# Patient Record
Sex: Female | Born: 1935 | Race: White | Hispanic: No | Smoking: Former smoker
Health system: Southern US, Community
[De-identification: ages and names within clinical notes are randomized; demographics above are authoritative.]

## PROBLEM LIST (undated history)

## (undated) DIAGNOSIS — Z950 Presence of cardiac pacemaker: Secondary | ICD-10-CM

## (undated) DIAGNOSIS — E785 Hyperlipidemia, unspecified: Secondary | ICD-10-CM

## (undated) DIAGNOSIS — F32A Depression, unspecified: Secondary | ICD-10-CM

## (undated) DIAGNOSIS — F329 Major depressive disorder, single episode, unspecified: Secondary | ICD-10-CM

## (undated) DIAGNOSIS — E119 Type 2 diabetes mellitus without complications: Secondary | ICD-10-CM

## (undated) DIAGNOSIS — I2510 Atherosclerotic heart disease of native coronary artery without angina pectoris: Secondary | ICD-10-CM

## (undated) DIAGNOSIS — I4892 Unspecified atrial flutter: Secondary | ICD-10-CM

## (undated) DIAGNOSIS — I10 Essential (primary) hypertension: Secondary | ICD-10-CM

## (undated) DIAGNOSIS — I509 Heart failure, unspecified: Secondary | ICD-10-CM

## (undated) DIAGNOSIS — I442 Atrioventricular block, complete: Secondary | ICD-10-CM

## (undated) HISTORY — DX: Type 2 diabetes mellitus without complications: E11.9

## (undated) HISTORY — DX: Heart failure, unspecified: I50.9

## (undated) HISTORY — DX: Hyperlipidemia, unspecified: E78.5

## (undated) HISTORY — DX: Atherosclerotic heart disease of native coronary artery without angina pectoris: I25.10

## (undated) HISTORY — DX: Major depressive disorder, single episode, unspecified: F32.9

## (undated) HISTORY — DX: Presence of cardiac pacemaker: Z95.0

## (undated) HISTORY — DX: Atrioventricular block, complete: I44.2

## (undated) HISTORY — DX: Depression, unspecified: F32.A

## (undated) HISTORY — DX: Unspecified atrial flutter: I48.92

## (undated) HISTORY — DX: Essential (primary) hypertension: I10

---
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
## 1989-01-09 HISTORY — PX: BREAST BIOPSY: SHX20

## 1994-01-09 HISTORY — PX: OTHER SURGICAL HISTORY: SHX169

## 1999-07-27 ENCOUNTER — Ambulatory Visit (HOSPITAL_COMMUNITY): Admission: RE | Admit: 1999-07-27 | Discharge: 1999-07-27 | Payer: Self-pay | Admitting: Cardiology

## 2000-10-19 ENCOUNTER — Emergency Department (HOSPITAL_COMMUNITY): Admission: EM | Admit: 2000-10-19 | Discharge: 2000-10-19 | Payer: Self-pay | Admitting: Emergency Medicine

## 2000-10-19 ENCOUNTER — Encounter: Payer: Self-pay | Admitting: Emergency Medicine

## 2000-10-31 ENCOUNTER — Encounter: Admission: RE | Admit: 2000-10-31 | Discharge: 2000-12-19 | Payer: Self-pay | Admitting: *Deleted

## 2001-02-04 ENCOUNTER — Encounter: Admission: RE | Admit: 2001-02-04 | Discharge: 2001-03-05 | Payer: Self-pay | Admitting: Orthopaedic Surgery

## 2001-02-13 ENCOUNTER — Ambulatory Visit (HOSPITAL_COMMUNITY): Admission: RE | Admit: 2001-02-13 | Discharge: 2001-02-13 | Payer: Self-pay | Admitting: Gastroenterology

## 2001-02-13 ENCOUNTER — Encounter: Payer: Self-pay | Admitting: Gastroenterology

## 2001-02-15 ENCOUNTER — Ambulatory Visit (HOSPITAL_COMMUNITY): Admission: RE | Admit: 2001-02-15 | Discharge: 2001-02-15 | Payer: Self-pay | Admitting: Gastroenterology

## 2001-02-15 ENCOUNTER — Encounter: Payer: Self-pay | Admitting: Gastroenterology

## 2001-04-08 ENCOUNTER — Ambulatory Visit (HOSPITAL_COMMUNITY): Admission: RE | Admit: 2001-04-08 | Discharge: 2001-04-08 | Payer: Self-pay | Admitting: Gastroenterology

## 2001-04-08 ENCOUNTER — Encounter: Payer: Self-pay | Admitting: Gastroenterology

## 2004-02-22 ENCOUNTER — Ambulatory Visit: Payer: Self-pay | Admitting: Cardiology

## 2004-02-25 ENCOUNTER — Ambulatory Visit: Payer: Self-pay | Admitting: Cardiology

## 2004-03-11 ENCOUNTER — Ambulatory Visit: Payer: Self-pay | Admitting: Cardiology

## 2005-01-04 ENCOUNTER — Inpatient Hospital Stay (HOSPITAL_COMMUNITY): Admission: AD | Admit: 2005-01-04 | Discharge: 2005-01-08 | Payer: Self-pay | Admitting: Cardiology

## 2005-01-04 ENCOUNTER — Ambulatory Visit: Payer: Self-pay | Admitting: Cardiology

## 2005-01-04 ENCOUNTER — Encounter: Payer: Self-pay | Admitting: Cardiology

## 2005-01-04 ENCOUNTER — Ambulatory Visit: Payer: Self-pay | Admitting: Internal Medicine

## 2005-01-05 ENCOUNTER — Encounter: Payer: Self-pay | Admitting: Cardiology

## 2005-01-06 ENCOUNTER — Encounter: Payer: Self-pay | Admitting: Cardiology

## 2005-01-06 ENCOUNTER — Ambulatory Visit: Payer: Self-pay | Admitting: Cardiology

## 2005-01-19 ENCOUNTER — Ambulatory Visit: Payer: Self-pay | Admitting: Cardiology

## 2005-01-20 ENCOUNTER — Ambulatory Visit (HOSPITAL_COMMUNITY): Admission: RE | Admit: 2005-01-20 | Discharge: 2005-01-21 | Payer: Self-pay | Admitting: Cardiology

## 2005-01-30 ENCOUNTER — Ambulatory Visit: Payer: Self-pay

## 2005-03-01 ENCOUNTER — Ambulatory Visit: Payer: Self-pay | Admitting: Cardiology

## 2005-03-06 ENCOUNTER — Ambulatory Visit: Payer: Self-pay | Admitting: Cardiology

## 2005-04-18 ENCOUNTER — Ambulatory Visit: Payer: Self-pay | Admitting: Internal Medicine

## 2005-07-20 ENCOUNTER — Ambulatory Visit: Payer: Self-pay | Admitting: Cardiology

## 2005-08-03 ENCOUNTER — Ambulatory Visit: Payer: Self-pay | Admitting: Cardiology

## 2005-12-19 ENCOUNTER — Ambulatory Visit: Payer: Self-pay | Admitting: Internal Medicine

## 2006-01-15 ENCOUNTER — Ambulatory Visit: Payer: Self-pay | Admitting: Cardiology

## 2006-01-16 ENCOUNTER — Ambulatory Visit: Payer: Self-pay | Admitting: Internal Medicine

## 2006-02-19 ENCOUNTER — Ambulatory Visit: Payer: Self-pay | Admitting: Internal Medicine

## 2006-03-01 ENCOUNTER — Ambulatory Visit: Payer: Self-pay | Admitting: Internal Medicine

## 2006-03-01 LAB — CONVERTED CEMR LAB
BUN: 14 mg/dL (ref 6–23)
Basophils Relative: 0.8 % (ref 0.0–1.0)
CO2: 32 meq/L (ref 19–32)
Cholesterol: 215 mg/dL (ref 0–200)
Direct LDL: 151 mg/dL
GFR calc Af Amer: 125 mL/min
Glucose, Bld: 167 mg/dL — ABNORMAL HIGH (ref 70–99)
HCT: 39.9 % (ref 36.0–46.0)
Hemoglobin: 13.9 g/dL (ref 12.0–15.0)
Lymphocytes Relative: 30.3 % (ref 12.0–46.0)
Microalb, Ur: 0.8 mg/dL (ref 0.0–1.9)
Monocytes Absolute: 0.6 10*3/uL (ref 0.2–0.7)
Monocytes Relative: 8.7 % (ref 3.0–11.0)
Neutro Abs: 3.6 10*3/uL (ref 1.4–7.7)
Neutrophils Relative %: 57.6 % (ref 43.0–77.0)
Potassium: 3.9 meq/L (ref 3.5–5.1)
Sodium: 141 meq/L (ref 135–145)
TSH: 1.7 microintl units/mL (ref 0.35–5.50)
VLDL: 28 mg/dL (ref 0–40)

## 2006-03-13 ENCOUNTER — Ambulatory Visit: Payer: Self-pay | Admitting: Internal Medicine

## 2006-03-22 ENCOUNTER — Ambulatory Visit: Payer: Self-pay | Admitting: Internal Medicine

## 2006-04-03 ENCOUNTER — Ambulatory Visit: Payer: Self-pay | Admitting: Internal Medicine

## 2006-04-03 LAB — CONVERTED CEMR LAB
AST: 15 units/L (ref 0–37)
Bilirubin, Direct: 0.2 mg/dL (ref 0.0–0.3)
Cholesterol: 151 mg/dL (ref 0–200)
HDL: 47.3 mg/dL (ref >39.0)
Total Bilirubin: 0.8 mg/dL (ref 0.3–1.2)
Total CHOL/HDL Ratio: 3.2
Total Protein: 7.3 g/dL (ref 6.0–8.3)
Triglycerides: 103 mg/dL (ref 0–149)

## 2006-04-13 ENCOUNTER — Ambulatory Visit: Payer: Self-pay | Admitting: Internal Medicine

## 2006-05-08 ENCOUNTER — Ambulatory Visit: Payer: Self-pay | Admitting: Internal Medicine

## 2006-06-11 ENCOUNTER — Ambulatory Visit: Payer: Self-pay | Admitting: Cardiology

## 2006-07-03 ENCOUNTER — Ambulatory Visit: Payer: Self-pay | Admitting: Internal Medicine

## 2006-07-06 ENCOUNTER — Ambulatory Visit: Payer: Self-pay | Admitting: Internal Medicine

## 2006-07-06 LAB — CONVERTED CEMR LAB
ALT: 19 units/L (ref 0–35)
AST: 17 units/L (ref 0–37)
BUN: 17 mg/dL (ref 6–23)
CO2: 32 meq/L (ref 19–32)
Calcium: 10.6 mg/dL — ABNORMAL HIGH (ref 8.4–10.5)
Chloride: 109 meq/L (ref 96–112)
Cholesterol: 210 mg/dL (ref 0–200)
Creatinine, Ser: 0.6 mg/dL (ref 0.4–1.2)
Direct LDL: 132.3 mg/dL
GFR calc Af Amer: 125 mL/min
GFR calc non Af Amer: 103 mL/min
Glucose, Bld: 140 mg/dL — ABNORMAL HIGH (ref 70–99)
HDL: 49.1 mg/dL (ref >39.0)
Hgb A1c MFr Bld: 6.8 % — ABNORMAL HIGH (ref 4.6–6.0)
Potassium: 3.8 meq/L (ref 3.5–5.1)
Sodium: 144 meq/L (ref 135–145)
Total CHOL/HDL Ratio: 4.3
Triglycerides: 133 mg/dL (ref 0–149)
VLDL: 27 mg/dL (ref 0–40)

## 2006-08-28 ENCOUNTER — Ambulatory Visit: Payer: Self-pay | Admitting: Internal Medicine

## 2006-09-03 ENCOUNTER — Ambulatory Visit: Payer: Self-pay | Admitting: Internal Medicine

## 2006-09-20 ENCOUNTER — Ambulatory Visit: Payer: Self-pay | Admitting: Internal Medicine

## 2006-10-23 ENCOUNTER — Ambulatory Visit: Payer: Self-pay | Admitting: Internal Medicine

## 2006-10-29 ENCOUNTER — Ambulatory Visit (HOSPITAL_COMMUNITY): Admission: RE | Admit: 2006-10-29 | Discharge: 2006-10-29 | Payer: Self-pay | Admitting: Ophthalmology

## 2006-11-05 ENCOUNTER — Ambulatory Visit: Payer: Self-pay | Admitting: Internal Medicine

## 2006-11-05 ENCOUNTER — Encounter: Payer: Self-pay | Admitting: Internal Medicine

## 2006-11-05 DIAGNOSIS — I10 Essential (primary) hypertension: Secondary | ICD-10-CM | POA: Insufficient documentation

## 2006-11-05 DIAGNOSIS — E782 Mixed hyperlipidemia: Secondary | ICD-10-CM

## 2006-11-05 DIAGNOSIS — E119 Type 2 diabetes mellitus without complications: Secondary | ICD-10-CM

## 2006-11-08 ENCOUNTER — Ambulatory Visit: Payer: Self-pay | Admitting: Internal Medicine

## 2006-11-15 ENCOUNTER — Encounter: Payer: Self-pay | Admitting: Internal Medicine

## 2006-11-16 LAB — CONVERTED CEMR LAB
BUN: 16 mg/dL (ref 6–23)
CO2: 32 meq/L (ref 19–32)
Calcium: 11.2 mg/dL — ABNORMAL HIGH (ref 8.4–10.5)
Creatinine, Ser: 0.6 mg/dL (ref 0.4–1.2)
Creatinine,U: 79 mg/dL
GFR calc Af Amer: 125 mL/min
Microalb Creat Ratio: 15.2 mg/g (ref 0.0–30.0)
Microalb, Ur: 1.2 mg/dL (ref 0.0–1.9)
Potassium: 4.1 meq/L (ref 3.5–5.1)

## 2006-12-10 ENCOUNTER — Ambulatory Visit: Payer: Self-pay

## 2006-12-18 ENCOUNTER — Ambulatory Visit: Payer: Self-pay | Admitting: Internal Medicine

## 2007-01-21 ENCOUNTER — Ambulatory Visit: Payer: Self-pay | Admitting: Internal Medicine

## 2007-05-22 ENCOUNTER — Ambulatory Visit: Payer: Self-pay | Admitting: Internal Medicine

## 2007-06-18 ENCOUNTER — Ambulatory Visit: Payer: Self-pay | Admitting: Internal Medicine

## 2007-09-19 ENCOUNTER — Ambulatory Visit: Payer: Self-pay | Admitting: Internal Medicine

## 2007-12-19 ENCOUNTER — Ambulatory Visit: Payer: Self-pay | Admitting: Internal Medicine

## 2007-12-23 ENCOUNTER — Ambulatory Visit: Payer: Self-pay

## 2008-02-24 ENCOUNTER — Ambulatory Visit (HOSPITAL_COMMUNITY): Admission: RE | Admit: 2008-02-24 | Discharge: 2008-02-24 | Payer: Self-pay | Admitting: Ophthalmology

## 2008-03-19 ENCOUNTER — Ambulatory Visit: Payer: Self-pay | Admitting: Internal Medicine

## 2008-04-06 ENCOUNTER — Ambulatory Visit (HOSPITAL_COMMUNITY): Admission: RE | Admit: 2008-04-06 | Discharge: 2008-04-06 | Payer: Self-pay | Admitting: Ophthalmology

## 2008-05-02 ENCOUNTER — Encounter (INDEPENDENT_AMBULATORY_CARE_PROVIDER_SITE_OTHER): Payer: Self-pay | Admitting: *Deleted

## 2008-05-27 DIAGNOSIS — I442 Atrioventricular block, complete: Secondary | ICD-10-CM | POA: Insufficient documentation

## 2008-05-27 DIAGNOSIS — H409 Unspecified glaucoma: Secondary | ICD-10-CM | POA: Insufficient documentation

## 2008-05-27 DIAGNOSIS — I2510 Atherosclerotic heart disease of native coronary artery without angina pectoris: Secondary | ICD-10-CM

## 2008-05-27 DIAGNOSIS — Z950 Presence of cardiac pacemaker: Secondary | ICD-10-CM | POA: Insufficient documentation

## 2008-06-18 ENCOUNTER — Ambulatory Visit: Payer: Self-pay | Admitting: Internal Medicine

## 2008-06-23 ENCOUNTER — Ambulatory Visit: Payer: Self-pay | Admitting: Internal Medicine

## 2008-06-23 DIAGNOSIS — G4710 Hypersomnia, unspecified: Secondary | ICD-10-CM

## 2008-06-23 DIAGNOSIS — R609 Edema, unspecified: Secondary | ICD-10-CM

## 2008-06-26 ENCOUNTER — Encounter: Payer: Self-pay | Admitting: Internal Medicine

## 2008-06-26 ENCOUNTER — Ambulatory Visit: Payer: Self-pay

## 2008-07-07 ENCOUNTER — Telehealth: Payer: Self-pay | Admitting: Internal Medicine

## 2008-08-18 ENCOUNTER — Telehealth: Payer: Self-pay | Admitting: Internal Medicine

## 2008-08-19 ENCOUNTER — Encounter (INDEPENDENT_AMBULATORY_CARE_PROVIDER_SITE_OTHER): Payer: Self-pay | Admitting: *Deleted

## 2008-09-01 ENCOUNTER — Telehealth (INDEPENDENT_AMBULATORY_CARE_PROVIDER_SITE_OTHER): Payer: Self-pay

## 2008-09-02 ENCOUNTER — Ambulatory Visit: Payer: Self-pay

## 2008-09-02 ENCOUNTER — Encounter: Payer: Self-pay | Admitting: Cardiovascular Disease

## 2008-09-17 ENCOUNTER — Ambulatory Visit: Payer: Self-pay | Admitting: Internal Medicine

## 2008-12-17 ENCOUNTER — Ambulatory Visit: Payer: Self-pay | Admitting: Internal Medicine

## 2009-01-28 ENCOUNTER — Encounter: Payer: Self-pay | Admitting: Internal Medicine

## 2009-01-28 ENCOUNTER — Ambulatory Visit: Payer: Self-pay

## 2009-03-18 ENCOUNTER — Ambulatory Visit: Payer: Self-pay | Admitting: Internal Medicine

## 2009-06-17 ENCOUNTER — Ambulatory Visit: Payer: Self-pay | Admitting: Internal Medicine

## 2009-07-06 ENCOUNTER — Telehealth: Payer: Self-pay | Admitting: Internal Medicine

## 2009-09-10 ENCOUNTER — Ambulatory Visit: Payer: Self-pay | Admitting: Internal Medicine

## 2009-11-05 ENCOUNTER — Encounter (INDEPENDENT_AMBULATORY_CARE_PROVIDER_SITE_OTHER): Payer: Self-pay | Admitting: *Deleted

## 2009-12-16 ENCOUNTER — Ambulatory Visit: Payer: Self-pay | Admitting: Internal Medicine

## 2010-02-08 NOTE — Cardiovascular Report (Signed)
Summary: TTM   TTM   Imported By: Roderic Ovens 07/19/2009 14:50:48  _____________________________________________________________________  External Attachment:    Type:   Image     Comment:   External Document

## 2010-02-08 NOTE — Assessment & Plan Note (Signed)
Summary: [facility]  Medications Added METFORMIN HCL 500 MG TB24 (METFORMIN HCL) Take 1 tablet by mouth two times a day DIOVAN HCT 160-12.5 MG TABS (VALSARTAN-HYDROCHLOROTHIAZIDE) Take 1 tablet by mouth once a day * VITAMIN D 5000 UNIT TABS (CHOLECALCIFEROL) Take 1 tablet by mouth two times a day PREDNISONE 10 MG TABS (PREDNISONE) use as directed dose pak ZYRTEC ALLERGY 10 MG TBDP (CETIRIZINE HCL) Take 1 tablet by mouth once a day for 10 days CIPROFLOXACIN HCL 250 MG TABS (CIPROFLOXACIN HCL) Take 1 tablet by mouth two times a day WELCHOL 3.75 GM PACK (COLESEVELAM HCL) one pak by mouth daily * MEGA RED 300MG  Take 1 tablet by mouth once a day GLUCOSAMINE-CHONDROITIN  CAPS (GLUCOSAMINE-CHONDROIT-VIT C-MN) Take 1 tablet by mouth two times a day GINGER OIL  OIL (GINGER OIL) 150mg  Take 1 tablet by mouth two times a day CINNAMON 500 MG CAPS (CINNAMON) Take 1/2 tablet by mouth two times a day      Allergies Added:   Visit Type:  Pacemaker check Primary Provider:  Darcel Bayley Nyland,MD   History of Present Illness: The patient presents today for routine electrophysiology followup. She reports doing very well since last being seen in our clinic. The patient denies symptoms of palpitations, chest pain, shortness of breath, orthopnea, PND, lower extremity edema, dizziness, presyncope, syncope, or neurologic sequela. She has noticed redness, swelling, and itching of her L eye for 1 week.  She was seen in Dr Deitra Mayo office and started on cipro and prednisone.  The patient is tolerating medications without difficulties and is otherwise without complaint today.    Preventive Screening-Counseling & Management  Alcohol-Tobacco     Smoking Status: quit     Year Quit: 1960's  Current Medications (verified): 1)  Metformin Hcl 500 Mg Tb24 (Metformin Hcl) .... Take 1 Tablet By Mouth Two Times A Day 2)  Diovan Hct 160-12.5 Mg Tabs (Valsartan-Hydrochlorothiazide) .... Take 1 Tablet By Mouth Once A Day 3)   Carvedilol 6.25 Mg Tabs (Carvedilol) .... One By Mouth Two Times A Day 4)  Coq10 100 Mg Caps (Coenzyme Q10) .... One By Mouth Daily 5)  Vitamin D 5000 Unit Tabs (Cholecalciferol) .... Take 1 Tablet By Mouth Two Times A Day 6)  Prednisone 10 Mg Tabs (Prednisone) .... Use As Directed Dose Pak 7)  Zyrtec Allergy 10 Mg Tbdp (Cetirizine Hcl) .... Take 1 Tablet By Mouth Once A Day For 10 Days 8)  Ciprofloxacin Hcl 250 Mg Tabs (Ciprofloxacin Hcl) .... Take 1 Tablet By Mouth Two Times A Day 9)  Welchol 3.75 Gm Pack (Colesevelam Hcl) .... One Pak By Mouth Daily 10)  Mega Red 300mg  .... Take 1 Tablet By Mouth Once A Day 11)  Glucosamine-Chondroitin  Caps (Glucosamine-Chondroit-Vit C-Mn) .... Take 1 Tablet By Mouth Two Times A Day 12)  Ginger Oil  Oil (Ginger Oil) .... 150mg  Take 1 Tablet By Mouth Two Times A Day 13)  Cinnamon 500 Mg Caps (Cinnamon) .... Take 1/2 Tablet By Mouth Two Times A Day  Allergies (verified): 1)  ! Lipitor 2)  ! Ace Inhibitors 3)  * Plavx  Comments:  Nurse/Medical Assistant: The patient's medication list and allergies were reviewed with the patient and were updated in the Medication and Allergy Lists.  Past History:  Past Medical History: Reviewed history from 05/27/2008 and no changes required. CAD, UNSPECIFIED SITE (ICD-414.00) PACEMAKER (ICD-V45.Marland Kitchen01) AV BLOCK, COMPLETE (ICD-426.0) HYPERLIPIDEMIA (ICD-272.2) HYPERTENSION (ICD-401.9) DIABETES MELLITUS, TYPE II (ICD-250.00) GLAUCOMA (ICD-365.9)    Past Surgical History: Reviewed  history from 05/27/2008 and no changes required. tonsillectomy in 1937. breast biopsy in 1991. left total knee replacement in 1996 PCM - [facility]  Social History: Reviewed history from 05/27/2008 and no changes required. Retired  Divorced  Tobacco Use - No.  Alcohol Use - yes Regular Exercise - no Drug Use - no Smoking Status:  quit  Review of Systems       All systems are reviewed and negative except as listed in the HPI.    Vital Signs:  Patient profile:   77 year old female Height:      63 inches Weight:      194 pounds BMI:     34.49 Pulse rate:   91 / minute BP sitting:   141 / 81  (left arm) Cuff size:   large  Vitals Entered By: Carlye Grippe (September 10, 2009 3:47 PM)  Nutrition Counseling: Patient's BMI is greater than 25 and therefore counseled on weight management options.  Physical Exam  General:  Well developed, well nourished, in no acute distress. Head:  normocephalic and atraumatic Eyes:  L eye periorbital cellulitis vision appears grossly preserved Mouth:  Teeth, gums and palate normal. Oral mucosa normal. Neck:  supple Chest Wall:  pacemaker pocket well healed Lungs:  Clear bilaterally to auscultation and percussion. Heart:  Non-displaced PMI, chest non-tender; regular rate and rhythm, S1, S2 without murmurs, rubs or gallops. Carotid upstroke normal, no bruit. Normal abdominal aortic size, no bruits. Femorals normal pulses, no bruits. Pedals normal pulses. No edema, no varicosities. Abdomen:  Bowel sounds positive; abdomen soft and non-tender without masses, organomegaly, or hernias noted. No hepatosplenomegaly. Msk:  Back normal, normal gait. Muscle strength and tone normal. Pulses:  pulses normal in all 4 extremities Extremities:  No clubbing or cyanosis. Neurologic:  Alert and oriented x 3.   PPM Specifications Following MD:  Sherryl Manges, MD     PPM Vendor:  [employer] Jude     PPM Model Number:  [id number]     PPM Serial Number:  [id number] PPM DOI:  January 22, 1927     PPM Implanting MD:  Sherryl Manges, MD  Lead 1    Location: [facility]     DOI: 01/06/2005     Model #: [id number]     Serial #: [id number]     Status: active Lead 2    Location: RV     DOI: 01/06/2005     Model #: [id number]     Serial #: [id number]     Status: active   Indications:  iNTERMITTENT COMPLETE HEART BLOCK   PPM Follow Up Battery Voltage:  2.76 V     Battery Est. Longevity:  5.75-8.50 YRS     Pacer Dependent:  Yes       PPM  Device Measurements Atrium  Amplitude: 2.4 mV, Impedance: 394 ohms, Threshold: 0.75 V at 0.5 msec Right Ventricle  Amplitude: PACED mV, Impedance: 1045 ohms, Threshold: 0.750 V at 0.5 msec  Episodes MS Episodes:  2     Percent Mode Switch:  <1%     Coumadin:  No Ventricular High Rate:  0     Atrial Pacing:  5.9%     Ventricular Pacing:  >99%  Parameters Mode:  DDD     Lower Rate Limit:  60     Upper Rate Limit:  125 Paced AV Delay:  200     Sensed AV Delay:  170 Next Cardiology Appt Due:  03/10/2010 Tech Comments:  2 AMS EPISODES--BOTH  WERE 6 SECONDS.  PACER DEPENDENT --NO UNDERLYING AT VVI 30.  NORMAL DEVICE FUNCTION.  NO CHANGES MADE. TTM THRU [facility] IN 3 MTHS.  ROV IN 6 MTHS W/DEVICE CLINIC. Vella Kohler  September 10, 2009 4:41 PM MD Comments:  agree  Impression & Recommendations:  Problem # 1:  AV BLOCK, COMPLETE (ICD-426.0) Normal pacemaker function no changes today  Problem # 2:  HYPERLIPIDEMIA (ICD-272.2) stable  Problem # 3:  HYPERTENSION (ICD-401.9) salt restriction advised  Problem # 4:  CAD, UNSPECIFIED SITE (ICD-414.00) no symptoms of ischemia  Patient Instructions: 1)  return in 6 months

## 2010-02-08 NOTE — Progress Notes (Signed)
Summary: question re pacemaker at the airport   Phone Note Call from Patient   Caller: Patient [phone] Reason for Call: Talk to Nurse Summary of Call: pt flying to [place] this weekend and wants to know if she can go through the security area at the airport due to her pacemaker or does she have to be patted down? pls call [phone] Initial call taken by: Glynda Jaeger,  July 06, 2009 3:29 PM  Follow-up for Phone Call        PT AWARE OKAY TO  GO THROUGH SECURITY AND  METAL DETECTOR AT AIRPORT  PER AMBER. Follow-up by: Scherrie Bateman, LPN,  July 06, 2009 3:43 PM

## 2010-02-08 NOTE — Cardiovascular Report (Signed)
Summary: Office Visit   Office Visit   Imported By: Roderic Ovens 02/10/2009 13:58:10  _____________________________________________________________________  External Attachment:    Type:   Image     Comment:   External Document

## 2010-02-08 NOTE — Procedures (Signed)
Summary: pc2  Medications Added CARVEDILOL 6.25 MG TABS (CARVEDILOL) one by mouth two times a day CALCIUM 500 MG TABS (CALCIUM CARBONATE) one by mouth daily COQ10 100 MG CAPS (COENZYME Q10) one by mouth daily * VITAMIN D 5000 UNIT TABS (CHOLECALCIFEROL)       Allergies Added: * PLAVX  Current Medications (verified): 1)  Metformin Hcl 500 Mg Tb24 (Metformin Hcl) .... Take 2 Tablet By Mouth Every Night 2)  Diovan Hct 80-12.5 Mg Tabs (Valsartan-Hydrochlorothiazide) .... Take One Tablet Once Daily 3)  Carvedilol 6.25 Mg Tabs (Carvedilol) .... One By Mouth Two Times A Day 4)  Calcium 500 Mg Tabs (Calcium Carbonate) .... One By Mouth Daily 5)  Coq10 100 Mg Caps (Coenzyme Q10) .... One By Mouth Daily 6)  Vitamin D 5000 Unit Tabs (Cholecalciferol)  Allergies (verified): 1)  ! Lipitor 2)  ! Ace Inhibitors 3)  * Plavx   PPM Specifications Following MD:  Sherryl Manges, MD     PPM Vendor:  [employer] Jude     PPM Model Number:  [id number]     PPM Serial Number:  [id number] PPM DOI:  01-18-1927     PPM Implanting MD:  Sherryl Manges, MD  Lead 1    Location: RA     DOI: 01/06/2005     Model #: [id number]     Serial #: [id number]     Status: active Lead 2    Location: RV     DOI: 01/06/2005     Model #: [id number]     Serial #: [id number]     Status: active   Indications:  iNTERMITTENT COMPLETE HEART BLOCK   PPM Follow Up Remote Check?  No Battery Voltage:  2.78 V     Battery Est. Longevity:  7.5 years     Pacer Dependent:  Yes       PPM Device Measurements Atrium  Amplitude: 0.9 mV, Impedance: 387 ohms, Threshold: 0.5 V at 0.5 msec Right Ventricle  Impedance: 861 ohms, Threshold: 0.625 V at 0.5 msec  Episodes MS Episodes:  0     Percent Mode Switch:  0     Coumadin:  No Atrial Pacing:  6.2%     Ventricular Pacing:  100%  Parameters Mode:  DDD     Lower Rate Limit:  60     Upper Rate Limit:  125 Paced AV Delay:  200     Sensed AV Delay:  170 Next Cardiology Appt Due:  07/09/2009 Tech Comments:  No parameter  changes.  Device function normal.  Rate response showed right shift.  She is 4 months post op knee replacement.  She will continue his TTM's with [facility] and return in 6 months for an office visit with Dr. Graciela Husbands. Altha Harm, LPN  January 28, 2009 12:59 PM

## 2010-02-08 NOTE — Cardiovascular Report (Signed)
Summary: TTM   TTM   Imported By: Roderic Ovens 01/29/2009 14:56:48  _____________________________________________________________________  External Attachment:    Type:   Image     Comment:   External Document

## 2010-02-08 NOTE — Cardiovascular Report (Signed)
Summary: TTM   TTM   Imported By: Roderic Ovens 03/31/2009 16:06:37  _____________________________________________________________________  External Attachment:    Type:   Image     Comment:   External Document

## 2010-02-08 NOTE — Miscellaneous (Signed)
Summary: dx correction  Clinical Lists Changes  Problems: Changed problem from PACEMAKER (ICD-V45..01) to PACEMAKER, PERMANENT (ICD-V45.01)  changed the incorrect dx code to correct dx code 

## 2010-02-08 NOTE — Cardiovascular Report (Signed)
Summary: [facility]  [facility]   Imported By: Dorise Hiss 09/16/2009 10:44:50  _____________________________________________________________________  External Attachment:    Type:   Image     Comment:   External Document

## 2010-02-10 NOTE — Cardiovascular Report (Signed)
Summary: TTM   TTM   Imported By: Roderic Ovens 12/24/2009 15:06:44  _____________________________________________________________________  External Attachment:    Type:   Image     Comment:   External Document

## 2010-03-17 ENCOUNTER — Encounter: Payer: Self-pay | Admitting: Internal Medicine

## 2010-03-17 DIAGNOSIS — I442 Atrioventricular block, complete: Secondary | ICD-10-CM

## 2010-03-30 ENCOUNTER — Ambulatory Visit: Payer: Self-pay

## 2010-04-21 LAB — BASIC METABOLIC PANEL
BUN: 13 mg/dL (ref 6–23)
CO2: 28 mEq/L (ref 19–32)
Calcium: 10.7 mg/dL — ABNORMAL HIGH (ref 8.4–10.5)
Chloride: 102 mEq/L (ref 96–112)
Creatinine, Ser: 0.47 mg/dL (ref 0.4–1.2)
GFR calc Af Amer: >60 mL/min (ref >60)

## 2010-04-26 LAB — BASIC METABOLIC PANEL
BUN: 10 mg/dL (ref 6–23)
CO2: 30 mEq/L (ref 19–32)
Calcium: 10.8 mg/dL — ABNORMAL HIGH (ref 8.4–10.5)
Chloride: 105 mEq/L (ref 96–112)
GFR calc non Af Amer: >60 mL/min (ref >60)
Glucose, Bld: 132 mg/dL — ABNORMAL HIGH (ref 70–99)
Potassium: 3.9 mEq/L (ref 3.5–5.1)
Sodium: 140 mEq/L (ref 135–145)

## 2010-04-26 LAB — HEMOGLOBIN AND HEMATOCRIT, BLOOD
HCT: 38.8 % (ref 36.0–46.0)
Hemoglobin: 13.3 g/dL (ref 12.0–15.0)

## 2010-05-24 NOTE — Letter (Signed)
June 18, 2007    Delaney Meigs, M.D.  [address]  [place], [place] [address]   RE:  DOMONIC, HISCOX  MRN:  [id number]  /  DOB:  1927-04-14   Dear Marolyn Haller:   Mrs. Gomm comes in today in follow-up for a pacemaker implanted for  complete heart block.  From that point of view, she is doing quite well.  She has no problems with chest pain or shortness of breath.  She is  starting to exercise with her daughter who underwent a freak injury of  her knee.  She has lost 10 pounds in the last year and a half and about  this, she is quite pleased.   MEDICATIONS:  1. Welchol.  2. Carvedilol 6.25 b.i.d.  3. Aspirin.  4. Metformin 500 b.i.d.  5. Diovan 80.   PHYSICAL EXAMINATION:  VITAL SIGNS:  Unfortunately, her blood pressure  is elevated 157/88.  Her pulse was 95.  LUNGS:  Clear.  CARDIOVASCULAR:  Her heart sounds were regular.  ABDOMEN:  Soft.  EXTREMITIES:  Without edema.   Interrogation of her [employer] pacemaker demonstrates a P-wave of 1.8  with impedance of 395, a threshold of 0.7 at 5.5 in both chambers, the R  wave was 11.7, impedance of 861.  Battery voltage 2.8.  She is 100%  ventricular paced and is indeed device dependent.   IMPRESSION:  1. Complete heart block.  2. Status post pacer for the above.  3. Ischemic heart disease with prior intervention of her left anterior      descending with normal left ventricular function.  4. Diabetes.  5. Hypertension.  6. Dyslipidemia.   Len, Mrs. Padin's device is working quite well.  She is exercising and  is able to lose weight.  Unfortunately, her secondary prevention  parameters, at least by labs that we have, are not well achieved.  Her  blood pressure was elevated today.  I told her that her target with her  diabetes was at least under 130/80.  Her hemoglobin A1c from 18 months  ago was 7.7 and her LDL was 130.  I told her her targets on these were  under 6.5 and under 70, respectively.  She is to see you tomorrow.  She  thinks that you have intercurrent lab work and it may well be that we  are closer to target.  I have given her a target card that we are  utilizing to try to help [place] track lab work from primary physicians so  that we do not have to repeat it and we are all on the same page as to  targets.   We will see her again in one year's time.  Please let us know if there  is anything we can do in the interim.    Sincerely,      Duke Salvia, MD, [facility]  Electronically Signed    SCK/MedQ  DD: 06/18/2007  DT: 06/18/2007  Job #: [id number]   CC:    Delaney Meigs, M.D.

## 2010-05-24 NOTE — Assessment & Plan Note (Signed)
[facility]                            CARDIOLOGY OFFICE NOTE   Brittany [name], Brittany [name]                      MRN:          [id number]  DATE:06/11/2006                            DOB:          1927/07/21    PRIMARY CARE PHYSICIAN:  Dr. Thomos Lemons.   REASON FOR VISIT:  Cardiac followup.   HISTORY OF PRESENT ILLNESS:  I saw Ms. An back in January.  Her  history is detailed in my previous note.  Symptomatically, she is not  having any problems with angina or limiting dyspnea on exertion beyond  NYHA class II.  Her electrocardiogram today shows a paced ventricular  rhythm at 98 beats per minute with magnet and 68 beats per minute  without, both dual chamber.  She is followed transtelephonically by Dr.  Graciela Husbands.  She brings in a blood pressure record today from home showing  systolics ranging anywhere from 110 to the 160s, although typically more  in the 130 range.  I spoke with her today a bit about perhaps  reconsidering Benicar which she had been on in the past as an addition  to her present regimen.  She is now on Welchol with prior STATIN  intolerance.  Her cholesterol has been followed by Dr. Artist Pais.   ALLERGIES:  ACE INHIBITORS and general STATIN intolerance.   PRESENT MEDICATIONS:  1. Welchol 25 mg 2 tablets p.o. b.i.d.  2. Metformin 500 mg 2 tablets p.o. nightly.  3. Carvedilol 6.25 mg p.o. b.i.d.  4. Hydrochlorothiazide 25 mg p.o. daily.  5. Multivitamin 1 p.o. daily.  6. Sublingual nitroglycerin 0.4 mg p.r.n.   REVIEW OF SYSTEMS:  As described in the history of present illness.  She  does have some cramps in her feet at times, and also reports not  sleeping well at night.   PHYSICAL EXAMINATION:  Blood pressure is 142/88, heart rate is 68,  weighs 192 pounds which is down 7 pounds from her last visit.  The patient is comfortable and in no acute distress.  Examination of the neck reveals no elevated jugular venous pressure,  loud bruits.   No thyromegaly is noted.  Lungs are clear without labored breathing at rest.  CARDIAC EXAM:  Reveals a regular rate and rhythm.  There is a soft,  systolic murmur, no S3 gallop.  ABDOMEN:  Soft, nontender.  EXTREMITIES:  Show no pitting edema.   IMPRESSION/RECOMMENDATIONS:  1. Coronary artery disease with a history of cutting balloon      angioplasty to the left anterior descending in January of 2007.      She is symptomatically stable.  I will plan to see her back in 6      months on medical therapy, and likely plan a followup Myoview at      that time.  She has not been taking aspirin daily, and we talked      about adding this back to her regimen.  I also think it would be      reasonable to reinitiate a low-dose angiotensin receptor blocker      (has tolerated Benicar  before) to both better optimize blood      pressure control and general cardiac risk modification.  She plans      to discuss this with Dr. Artist Pais later in the week.  She has been      somewhat hesitant in general to add any medications.  2. Further plans to follow.     Jonelle Sidle, MD  Electronically Signed    SGM/MedQ  DD: 06/11/2006  DT: 06/11/2006  Job #: [id number]   cc:   Barbette Hair. Artist Pais, DO

## 2010-05-27 NOTE — Cardiovascular Report (Signed)
[place]  Patient:    Brittany [name], Brittany [name]                      MRN: [id number] Proc. Date: 07/27/99 Adm. Date:  [id number] Disc. Date: [id number] Attending:  Lenoria Farrier CC:         Colon Flattery, D.O.             Thomas C. Wall, M.D. [facility]             Bruce R. Juanda Chance, M.D. [facility]             Cardiac pulmonary lab                        Cardiac Catheterization  PROCEDURE:  Cardiac catheterization.  INDICATION:  Ms. Stahl is 77 years old and has a history of hypertension and a history of chest and jaw discomfort suggestive of angina.  She was seen in consultation by Dr. Jesse Sans. Wall, who arranged for her to have evaluation with coronary angiography.  DESCRIPTION OF PROCEDURE:  The procedure was performed by the right femoral artery using arterial sheath and 6 French preformed coronary catheters.  A front wall arterial puncture was performed and Omnipaque contrast was used.  At the end of the procedure, the right femoral artery was closed with perclose.  This aortogram was performed to rule out renal vascular of hypertension.  The patient tolerated the procedure well and left the laboratory in satisfactory condition.  RESULTS:  Angiographic data: 1. The left main coronary:  The left main coronary was free of significant    disease. 2. The left anterior descending coronary artery:  Left anterior descending    coronary artery gave rise to three diagonal branches and three septal    perforators.  The LAD palpable was free of significant disease. 3. The circumflex artery:  The circumflex artery gave rise to a small marginal    branch, a large marginal branch, and two posterolateral branches.  There    was some irregularity in the circumflex but no major destruction. 4. The right coronary artery:  Right coronary artery was a moderate size    vessel that gave rise to a clonus branch, a right ventricular branch,    a posterior descending  branch, and two posterolateral branches.  These    vessels were free of significant disease. 5. The left ventriculogram:  Left ventriculogram performed in the [facility]    projection showed good wall motion with no areas of hypokinesis.  The    estimated ejection fraction was 60%. 6. A distal aortogram:  A distal aortogram was performed which showed patent    renal arteries and no significant aortoiliac obstruction.  Hemodynamic data: 1. Aortic pressure was 178/73 with a mean of 109. 2. Left ventricular pressure was 178/30.  CONCLUSION:  Normal coronary angiography with the exception of minimal irregularity in the circumflex artery and normal left ventricular function.  RECOMMENDATIONS:  The patient does not have obstructive disease to explain her recent symptoms.  Some of these could be on the basis of hypertensive disease. She does have an elevated LV/EDP and hypertension in the lab.  We will plan reassurance with stress and hypertension control.  We will plan to send her home as an outpatient today and have her see Dr. Colon Flattery back in follow-up in the next couple of weeks. DD:  07/27/99 TD:  07/28/99 Job: [id number]  [id number]

## 2010-05-27 NOTE — Discharge Summary (Signed)
Brittany [name], Brittany [name] NO.:  [id number]   MEDICAL RECORD NO.:  [id number]          PATIENT TYPE:  INP   LOCATION:  2904                         FACILITY:  [facility]   PHYSICIAN:  Ok Anis, NPDATE OF BIRTH:  17-Aug-1927   DATE OF ADMISSION:  01/04/2005  DATE OF DISCHARGE:                                 DISCHARGE SUMMARY   ADDENDUM:  Prior to patient's discharge, a urinalysis was sent off and came  back positive.  She is being prescribed Cipro 250 mg b.i.d. x 7 days for  treatment of urinary tract infection in addition to her other listed  medications.      Ok Anis, NP     CRB/MEDQ  D:  01/08/2005  T:  01/09/2005  Job:  [id number]

## 2010-05-27 NOTE — Assessment & Plan Note (Signed)
[facility]                            CARDIOLOGY OFFICE NOTE   Brittany [name], Brittany [name]                      MRN:          [id number]  DATE:01/15/2006                            DOB:          1927/06/03    REASON FOR VISIT:  Routine cardiac followup.   HISTORY OF PRESENT ILLNESS:  I saw Brittany [name] back in July.  She has a  history of coronary artery disease, status post cutting balloon  angioplasty to the left anterior descending in January of 2007,  hyperlipidemia with prior intolerances to Statins, probable type 2  diabetes mellitus, and hypertension.  She also has a history of complete  heart block and is status post dual chamber pacemaker placement by Dr.  Graciela Husbands, with last device interrogation being in December of 2007.  As  noted in Dr. Odessa Fleming previous note, Brittany [name] requested followup with  one of our internal medicine physicians and has been scheduled to see  Dr. Artist Pais in the near future.  She has taken herself of several  medications over the course of time and now remains on the regimen  listed below.  I talked with her today about resuming a low-dose aspirin  and otherwise did not address any new medication changes.  Her  electrocardiogram shows a dual chamber paced rhythm at 98 BPM with  magnet and ventricular paced rhythm at 86 BPM without magnet.  Symptomatically, she has fairly regular indigestion that is not  similar to her presentation with diagnosis of coronary disease.  Her  main symptoms at that time were of significant dyspnea on exertion and  weakness.  She occasionally has a tingling in her jaw and also some  left shoulder discomfort, although not specifically with exertion, and  she states that this seems to get better when she changes position or  puts a pillow under her arm.   ALLERGIES:  No known drug allergies.   CURRENT MEDICATIONS:  1. Hydrochlorothiazide 25 mg p.o. daily.  2. Metoprolol 25 mg p.o. b.i.d.   REVIEW OF  SYSTEMS:  As in history of present illness.   PHYSICAL EXAMINATION:  Blood pressure is 138/86, heart rate is 86,  weight is 199 pound.  The patient is comfortable, in no acute distress.  NECK:  No elevated jugular venous pressure, without bruits.  LUNGS:  Clear, without labored breathing.  CARDIAC:  Reveals a regular rate and rhythm, no S3 gallop is noted.  ABDOMEN:  Nontender, normoactive bowel sounds.  EXTREMITIES:  No pitting edema.   IMPRESSION/RECOMMENDATIONS:  1. Coronary disease, status post cutting balloon angioplasty of left      anterior descending in January of 2007.  I will plan symptom      followup over the next 6 months and perhaps consider a Myoview in      one year's time, assuming she does not manifest any progressive      symptomatology.  I encouraged her to begin taking aspirin 81 mg      daily in the meanwhile and otherwise continue her metoprolol and      hydrochlorothiazide.  I anticipate  that other medications may also      be beneficial in her case, particularly with probable diabetes and      dyslipidemia, although to be frank she has been fairly resistant to      medical therapy ongoing.  2. Hyperlipidemia, previously with Statin intolerance, and not      interested in Statin medications at this time.  3. Hypertension, previously on Benicar.     Jonelle Sidle, MD  Electronically Signed    SGM/MedQ  DD: 01/15/2006  DT: 01/15/2006  Job #: [id number]   cc:   Barbette Hair. Artist Pais, DO

## 2010-05-27 NOTE — Cardiovascular Report (Signed)
NAMEZIONAH, [name] NO.:  [id number]   MEDICAL RECORD NO.:  [id number]          PATIENT TYPE:  INP   LOCATION:  2904                         FACILITY:  [facility]   PHYSICIAN:  Arvilla Meres, M.D. LHCDATE OF BIRTH:  1928-01-08   DATE OF PROCEDURE:  01/06/2005  DATE OF DISCHARGE:                              CARDIAC CATHETERIZATION   PRIMARY CARE PHYSICIAN:  Dr. Samuel Jester.   CARDIOLOGIST:  Dr. Simona Huh in the [place] office.   PATIENT IDENTIFICATION:  Brittany [name] is a very pleasant 77 year old woman who  was admitted with a several day history of progressive dizziness and some  mild chest discomfort.  She ruled out for myocardial infarction but  telemetry showed a high-grade heart block, and she is scheduled for a  permanent pacer.  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  Thus, she is referred for cardiac catheterization prior to her  pacemaker implantation.   PROCEDURES PERFORMED:  1.  Selective coronary angiography.  2.  Left heart catheterization.  3.  Left ventriculogram.   DESCRIPTION OF PROCEDURE:  The risks and benefits of catheterization are  explained to Brittany [name].  Consent was signed and placed on the chart.  A 6-  French arterial sheath was placed in right femoral artery.  Standard  catheters including JL-4, JR-4, and angled pigtail were used for the  procedure.  All catheter exchanges made over wire.  There are no apparent  complications.   1.  Central aortic pressure was 151/65 with a mean of 99.  2.  LV pressure is 150/3 with an LVEDP at 15. There is no significant      gradient on aortic valve pullback.  3.  Left main was normal.  4.  LAD was a long vessel wrapping the apex.  It gave off four small      diagonals.  In the mid section after the third diagonal, there was 70-      80% focal stenosis in the LAD.  5.  Right coronary  artery is a moderate-sized dominant vessel.  It gives off      a large PDA and a small PL. There is no significant angiographic CAD.      There are some mild luminal irregularities.  6.  Left ventricular ejection fraction is 70%.   ASSESSMENT:  1.  One-vessel coronary artery disease with a moderate to high-grade lesion      in the left anterior descending artery.  2.  Normal left ventricle function.  3.  High degree heart block.   PLAN:  I have discussed the case with Dr. Graciela Husbands and Dr. Riley Kill.  Given her  lack of significant ischemic symptoms at this time, we will proceed with  pacemaker implantation first and then we will see her back in the office  next week with a plan to proceed with percutaneous intervention in two  weeks, once we have assured that her pacer pocket is stable.  She will also  need  aggressive risk factor management.      Arvilla Meres, M.D. [facility]  Electronically Signed     DB/MEDQ  D:  01/06/2005  T:  01/06/2005  Job:  [id number]   cc:   Samuel Jester  Fax: [phone]   Jonelle Sidle, M.D. [facility]  [address]  [place]  [place] [address]

## 2010-05-27 NOTE — Assessment & Plan Note (Signed)
[facility]                              CARDIOLOGY OFFICE NOTE   Brittany, [name]                      MRN:          [id number]  DATE:07/20/2005                            DOB:          1927-11-13    REASON FOR VISIT:  Routine followup.   HISTORY OF PRESENT ILLNESS:  Brittany [name] comes in for routine visit.  I  actually previously saw her up in the [facility]; however, she has  transitioned care to our [facility] location.  She lives in [place] with  her daughter.  She has a history of coronary artery disease status post  cutting balloon intervention to the left anterior descending in January of  2007 with normal ejection fraction and has prior history of presyncope with  second-degree heart block requiring a permanent pacemaker placement in  December of 2006 by Dr. Graciela Husbands.  She denies having any significant angina.  She said she is even bothered at the heat.  She has had fatigue and  restlessness when she has been outdoors but feels much better when inside or  sitting in her pool.  She had described some problems with significant  fatigue and myalgias on Lipitor and was taken off of this by Dr. Graciela Husbands in  the past.  She states that her symptoms improved, although she still had  some cramping in her legs, which has now reportedly improved after  stopping Benicar.  She tells me quite frankly that she would prefer a  minimum of medications and I certainly get this impression.  Her regimen  otherwise remains unchanged.  Her electrocardiogram today shows a  ventricular paced rhythm with atrial sensing at 66 beats per minute.   ALLERGIES:  No known drug allergies.   MEDICATIONS:  Present medications include aspirin 325 mg p.o. q.d.  Hydrochlorothiazide 25 mg p.o. q.d.  Toprol 25 mg p.o. b.i.d.  Sublingual  nitroglycerin 0.4 mg p.r.n. and Tylenol p.r.n.   REVIEW OF SYSTEMS:  As described in the history of present illness.   EXAMINATION:  VITAL  SIGNS:  Blood pressure 140/80.  Heart rate 66.  Weight  205 lbs.  Patient is in no acute distress.  Denying any active symptoms.  NECK:  Reveals __________ jugular venous pressure.  LUNGS:  Clear without labored breathing.  HEART:  Cardiac exam both a regular rate and rhythm without rub, murmur or  gallop.  EXTREMITIES:  Exhibit trace edema.   IMPRESSION/RECOMMENDATION:  1.  Hypertension, not optimally controlled.  The patient discontinued      Benicar a few weeks ago.  We talked about risk factor reduction      strategies and benefits of long-term hypertension management.  She is      willing to try samples of a different angiotensin-receptor blocker and      we will have a blood pressure check in two weeks. We can then adjust      therapy from there.  2.  Coronary disease, status post cutting balloon intervention to the left      anterior descending as outlined.  She is not  reporting any angina.  Will      plan to continue aspirin and metoprolol with p.r.n. nitroglycerin.  I      will plan to see her back over the next six months.  3.  Hyperlipidemia, taken off of Lipitor and now on no statin therapy.      Medications discussed above.  We also reviewed dietary modifications      today.  I suspect that ultimately we may try different statin      preparation although I do not want to try two medicines at one time      given her prior intolerances.  We did talk about using fish oil      supplements.  4.  History of presyncope and secondary heart block status post pacemaker      placement.  This is followed up by Dr. Graciela Husbands in our [facility].                                Jonelle Sidle, MD    SGM/MedQ  DD:  07/20/2005  DT:  07/20/2005  Job #:  [id number]   cc:   Dinah Beers, MD, [facility]

## 2010-05-27 NOTE — Assessment & Plan Note (Signed)
[facility]                         ELECTROPHYSIOLOGY OFFICE NOTE   MAGEN, SURIANO                      MRN:          [id number]  DATE:12/19/2005                            DOB:          07-16-1927    Ms. Brittany [name] comes in today. She is very frustrated with her health care  to date. She was noted to be diabetic and she was started on metformin  and an ACE inhibitor. Previously she had been on an ACE inhibitor and  this has been changed to an ARB. After that she developed a sore throat,  a cold, a cough, and has felt miserable. She stopped the medications and  is feeling better. She feels like the doctors are not communicating with  her and are not doing well in terms of her care.   PHYSICAL EXAMINATION:  VITAL SIGNS: Her blood pressure is elevated today  at 150/90, pulse is 80.  LUNGS: Clear.  HEART: Regular heart sounds.   Interrogation of her [employer] pacemaker demonstrates a P wave of 0.5  with an impedence of 392, a threshold of 0.5 and 0.5. The R wave was 12  with an impedence of 932 and a threshold of 0.75 and 0.5. The device was  reprogrammed for atrial sensitivity to improve that. Atrial outputs were  decreased. It should be noted that she is 100% ventricular paced and 20%  atrial paced.   IMPRESSION:  1. Complete heart block.  2. Status post pacemaker for the above.  3. Diabetes.  4. Hypertension.   Ms. Brittany [name] is quite frustrated with her care. She clearly has diabetes  by her blood sugar reaching 150. Her blood pressure is poorly controlled  at 150/90.   Will try to set her up to see one of our internists at her request. She  does have Medicare so that may be a problem.   Will see her again in one year's time.     Duke Salvia, MD, [facility]  Electronically Signed    SCK/MedQ  DD: 12/19/2005  DT: 12/19/2005  Job #: [id number]

## 2010-05-27 NOTE — H&P (Signed)
NAME:  Brittany [name], Brittany [name] NO.:  [id number]   MEDICAL RECORD NO.:  [id number]          PATIENT TYPE:  INP   LOCATION:  NA                           FACILITY:  [facility]   PHYSICIAN:  Jonelle Sidle, M.D. LHCDATE OF BIRTH:  1927/05/26   DATE OF ADMISSION:  DATE OF DISCHARGE:                                HISTORY & PHYSICAL   REASON FOR ADMISSION:  Progressive intermittent dyspnea on exertion,  dizziness, and chest discomfort.   HISTORY OF PRESENT ILLNESS:  Brittany [name] is a 77 year old woman with a  history of hypertension, family history of cardiovascular disease, and  previous reassuring cardiac evaluation including apparently normal coronary  arteries by angiography in 2001 as well as negative findings for ischemia by  Cardiolite in February of this year.  She was last seen in our office in  March following cardiac testing with symptoms of shortness of breath.  She  was referred back today following a visit with Dr. Charm Barges during which the  patient described progressive symptoms of dizziness, sometimes when  standing, and also with exertion, also associated with shortness of breath  with activity.  She has also had chest tightness occasionally and a sharp  left arm discomfort as well as numbness in her left facial area.  These  symptoms are all not necessarily concordant in time.  She additionally has  experienced some nausea in the morning when she gets up associated with  cough and mentions that sometimes she feels like she cannot focus and she  tends to drop things.  Symptoms have been worse in an intermittent fashion  over the last 2-3 months and not entirely predictable.  She came into the  office today in [place] and told me that her symptoms are much worse.  She had  an electrocardiogram done in the office today which shows sinus rhythm at 62  beats per minute with a prolonged PR interval and left anterior fascicular  block which is not new in comparison to prior  tracing from February.  Orthostatic measurements were made and it was not noted the patient was  orthostatic.  We had a long discussion about her symptomatology and given  its progressive nature and, as yet, uncertain etiology, our plan is to  proceed with admission to the hospital for further investigation.  She was  in agreement with this plan.   ALLERGIES:  No known drug allergies.   PRESENT MEDICATIONS:  Benicar HCT 40/25 mg p.o. daily.   PAST MEDICAL HISTORY:  1.  Hypertension.  2.  Reportedly normal coronary arteries by cardiac catheterization in 2001.      The most recent ischemic testing was via an Adenosine Cardiolite in      February of this year showing no large reversible perfusion defects with      some apical thinning but an ejection fraction of 62% which was felt to      be negative for ischemia.  Echocardiography at that time showed normal      left ventricular  contraction with mild pulmonic mitral tricuspid and  aortic regurgitation.  Incidentally noted was a small pericardial      effusion without hemodynamic significance.  3.  Status post left total knee replacement in 1996 with chronic left lower      extremity pain since that time.  4.  Status post prior breast biopsy in 1991.  5.  Status post tonsillectomy in 1937.   SOCIAL HISTORY:  The patient lives in [place].  She is originally from  [place], [place].  She has several animals that she cares for at home and  enjoys working outdoors when she is able.  She drinks alcohol socially and  denies any significant tobacco use history.   FAMILY HISTORY:  Significant for premature cardiovascular disease in  multiple relatives.  The patient's father died at age 36 with a cerebral  hemorrhage.  Her mother, however, lived to age 62 and died with a myocardial  infarction.   REVIEW OF SYMPTOMS:  As described in the history of present illness.  Ms.  Moudy denies any fevers or chills.  Her cough in the mornings has  been  nonproductive.  She has had no hemoptysis.  She denies any palpitations.  She has had no obvious melena or hematochezia.  She has had no lower  extremity edema.  Systems are, otherwise, negative.   PHYSICAL EXAMINATION:  VITAL SIGNS:  Blood pressure supine 146/78, blood pressure seated 170/80,  blood pressure standing 180/90.  Heart rate typically in the 60s.  Weight  206.4 pounds.  GENERAL:  This is an overweight woman seated in no acute distress.  HEENT:  Conjunctivae and lids are normal.  Pharynx clear.  NECK:  Supple without bruits or elevated jugular venous pressure.  No  thyromegaly is noted.  LUNGS:  Clear with non-labored breathing at rest.  HEART:  Regular rate and rhythm with a soft basal systolic murmur, there is  no S3 gallop or pericardial rub.  PMI is nondisplaced.  ABDOMEN:  Soft, present bowel sounds, no obvious hepatomegaly.  EXTREMITIES:  No pitting edema.  Peripheral pulses are 1-2+.  SKIN:  No ulcerative changes are noted.  MUSCULOSKELETAL:  No kyphosis is noted.  NEUROPSYCHIATRIC:  The patient is alert and oriented x 3, affect normal,  Romberg negative.  Cranial nerves grossly intact.   IMPRESSION:  1.  2-3 month history of progressive intermittent episodes of dizziness,      sometimes related to standing and other times related to activity in      general.  The patient has also experienced dyspnea exertion with      intermittent chest tightness, sharp left arm discomfort, and feeling of      left facial numbness.  Additional symptoms have included nausea in the      morning associated with cough as well as a sensed inability to focus and      occasionally dropping things.  These symptoms do not all occur      simultaneously and, as yet, etiology is uncertain.  We discussed a      number of possibility including cardiac, neurological, and pulmonary.     Electrocardiogram shows no new changes with a left anterior fascicular      block and prolonged PR  interval, at rest.  She has noted no specific      palpitations and has had no frank syncope.  Her last cardiac assessment      was in March of this year.  2.  Hypertension.  3.  Family history of cardiovascular  disease.  4.  Uncertain lipid status.  5.  Possible obstructive sleep apnea based on prior questioning.   PLAN:  1.  The patient will be admitted to the telemetry unit at [facility].  I will plan to cycle cardiac markers, follow telemetry, and      evaluate D-dimer level, fasting lipid profile, serial      electrocardiograms, and a baseline chest x-ray.  2.  I also plan to repeat 2D echocardiogram to follow up on left ventricular      function and previously noted small pericardiac effusion that was noted      incidentally.  3.  Proceed with head CT scan to exclude any acute hemorrhage or mass.  If      this study is negative, would anticipate initiating heparin per pharmacy      protocol, otherwise, continue aspirin and Benicar HCT.  4.  Will anticipate diagnostic cardiac catheterization tomorrow unless other      obvious etiologies are uncovered.  If      D-dimer level is significantly abnormal, may need to consider a CT scan      of the chest first.  Obviously, if there is a clear neurological issue,      we will need formal neurology consultation.  5.  Further plans to follow.           ______________________________  Jonelle Sidle, M.D. [facility]     SGM/MEDQ  D:  01/04/2005  T:  01/04/2005  Job:  [id number]   cc:   Samuel Jester  Fax: [phone]

## 2010-05-27 NOTE — Assessment & Plan Note (Signed)
[name]                           PRIMARY CARE OFFICE NOTE   Brittany [name], Brittany [name]                      MRN:          [id number]  DATE:02/19/2006                            DOB:          01-Nov-1927    CHIEF COMPLAINT:  New patient in practice.   HISTORY OF PRESENT ILLNESS:  The patient is a 77 year old white female  here to establish primary care, referred by Dr. Graciela Husbands.  The patient was  formerly followed by Dr. Magnus Sinning. [facility] at [facility].   PAST MEDICAL HISTORY:  Significant for coronary artery disease, she is  status post cutting balloon angioplasty to LAD in January of 2007.  She  has also underwent dual chamber pacemaker for complete heart block.   She has history of abnormal glucose and was told she was frankly  diabetic within the last several months.  She was tried on metformin  therapy, however she was also started on an ACE inhibitor at the same  time.  The patient had severe adverse reaction including sore throat and  cough, overall felt miserable, and since discontinuing both medications  feels much better.  She has a history of hyperlipidemia but INTOLERANT  TO STATINS.  The patient communicates an overall aversion to taking  prescription medications.   PAST MEDICAL HISTORY SUMMARY:  1. Hypertension.  2. Obesity.  3. Type 2 diabetes.  4. Coronary artery disease, status post angioplasty LAD January 2007.  5. Pacemaker for complete heart block.  6. History of tonsillectomy in 1937.  7. Breast biopsy in 1991.  8. Status post left total knee replacement 1996.  9. Status post EGD in March of 2003.   CURRENT MEDICATIONS:  1. HCTZ 25 mg once a day.  2. Metoprolol 25 mg b.i.d.  3. Multivitamin once a day.   ALLERGIES TO MEDICATIONS:  INCLUDE ACE INHIBITOR.   SOCIAL HISTORY:  The patient is divorced, currently lives with her  daughter.  She is retired as a Agricultural engineer.   FAMILY HISTORY:  Father deceased  at age 69 secondary to complications of  cerebral hemorrhage, he was also an alcoholic.  There are several other  family members, her siblings who were alcoholics.  Mother lived to be in  her 72s.  Brother is known to have an early myocardial infarction.   HABITS:  Occasional wine, denies any tobacco abuse.   REVIEW OF SYSTEMS:  No fevers, chills, no HEENT symptoms.  The patient  does not exercise on a regular basis but denies any recent issues with  chest pain or dyspnea, occasional heartburn.  No dysuria, frequency,  urgency.  All other systems negative.   PHYSICAL EXAMINATION:  VITAL SIGNS:  Height is 5 foot 3 inches, weight  is 200 pounds, temperature is 98.7, pulse is 55, and blood pressure is  159/79 left arm in the seated position.  GENERAL:  The patient is a pleasant, obese 77 year old white female who  appears her stated age.  HEENT:  Normocephalic/atraumatic, pupils were equal and reactive to  light bilaterally, extraocular motility was intact, the patient was  anicteric,  conjunctivae was within normal limits.  External auditory  canals and tympanic membranes were clear bilaterally.  Hearing was  grossly normal, oropharyngeal exam was unremarkable.  NECK:  Supple, could not appreciate any carotid bruit or thyromegaly.  CHEST:  Normal expiratory effort, chest was clear to auscultation  bilaterally.  No rhonchi, rubs, or wheezing.  CARDIOVASCULAR:  Regular rate and rhythm, no significant murmurs, rubs,  or gallops appreciated.  ABDOMEN:  Was obese but nontender.  No organomegaly.  MUSCULOSKELETAL:  No clubbing, cyanosis, or edema.  The patient had  decreased sensations to vibration of her lower extremities.  SKIN:  Was otherwise warm and dry.  NEURO:  Cranial nerves II-XII grossly intact, she was nonfocal.  She did  have painful area behind her left calf which felt cystic.   IMPRESSION:  1. Obesity with type 2 diabetes with clinical features of metabolic      syndrome.   2. Hypertension, suboptimally controlled.  3. History of coronary artery disease, status post cutting balloon      angioplasty January 2007.  4. Hyperlipidemia with INTOLERANCE TO STATINS.  5. Health maintenance.   RECOMMENDATIONS:  The patient will maintain a blood pressure log over  the next 1 month's time.  She was instructed to record at least 10  outpatient blood pressure readings.  We established the goal of less  than 130/80 considering her risk factors of coronary artery disease and  type 2 diabetes.   She had not had an A1c checked recently, this will be arranged.  She was  given information about diet and exercise.  We may discuss in the future  retrying metformin if A1c is suboptimal.  Followup time:  One month.     Barbette Hair. Artist Pais, DO  Electronically Signed    RDY/MedQ  DD: 02/19/2006  DT: 02/19/2006  Job #: [id number]

## 2010-05-27 NOTE — Discharge Summary (Signed)
NAMEMarland [name]  VIRDELL, HOILAND NO.:  [id number]   MEDICAL RECORD NO.:  [id number]          PATIENT TYPE:  INP   LOCATION:  2904                         FACILITY:  [facility]   PHYSICIAN:  Jonelle Sidle, M.D. LHCDATE OF BIRTH:  1927-11-28   DATE OF ADMISSION:  01/04/2005  DATE OF DISCHARGE:  01/08/2005                                 DISCHARGE SUMMARY   PRIMARY CARDIOLOGIST:  Nona Dell, M.D.   ELECTROPHYSIOLOGIST:  Sherryl Manges, M.D.   PRINCIPAL DIAGNOSIS:  Presyncope with second degree type 2 heart block.   OTHER DIAGNOSES:  1.  Coronary artery disease.  2.  Hypertension.  3.  Hyperlipidemia.  4.  A 16 mm calcified granuloma in the left lung base by CT.  5.  Morbid obesity.  6.  Status post left total knee replacement 1996 with chronic left lower      extremity pain.  7.  Status post prior breast biopsy 1991.  8.  Status post tonsillectomy 1937.   ALLERGIES:  NO KNOWN DRUG ALLERGIES.   PROCEDURE:  Left heart cardiac catheterization, successful placement of St.  Jude's Victory XL DR dual chamber permanent pacemaker.   HISTORY OF PRESENT ILLNESS:  A 77 year old white female with no prior  history of coronary artery disease who was evaluated at the [facility] by Dr. Simona Huh on 12/27 for complaints of progressive  dizziness and presyncope, exertional dyspnea as well as angina.  Decision  was made to admit her to [facility] for further evaluation.   HOSPITAL COURSE:  Ms. Brittany [name] ruled out for MI and underwent a head CT to  evaluate potential causes of dizziness.  This showed no significant  abnormality.  A chest CT was also performed secondary to chest pain and  shortness of breath which showed no evidence of pulmonary embolism but did  show a 16 mm mass in the left lung base posteriorly which has coarse central  calcification and is consistent with prominent granuloma.  When compared to  CT in February 2003, it had increased in size  as it was 11 mm at that time.  She was noted on telemetry to be experiencing intermittent, high grade,  Mobic type 2 heart block with 3:1 AV conduction as well as intermittent SVT  versus ectopic atrial tachycardia.  On December 29 she underwent a left  heart cardiac catheterization which revealed a 70-80% mid LAD and otherwise  nonobstructive coronary disease.  Secondary to heart block, discussions were  had with EP and she underwent successful placement of St. Jude's Victory XL  DR DDDR dual chamber permanent pacemaker on December 29.  She tolerated this  well and arrangements were made for her to come back as an outpatient for  PCI and stenting of the LAD which will take place on Friday January 12 at 6  a.m.  Ms. Brittany [name] has not had any recurrent chest discomfort and has  continued to have intermittent episodes of asymptomatic atrial tachycardia  for which we have titrated her beta blocker.  She is being discharged home  today in satisfactory condition.  DISCHARGE LABORATORY DATA:  Hemoglobin 13.2, hematocrit 37.9, WBC 19.6 (The  patient received Solu-Medrol on December 29), platelets 236.  Sodium 139,  potassium 3.9, chloride 107, CO2 23, BUN 16, creatinine 0.6, glucose 172.  Calcium 10.3.  Total cholesterol 184, triglycerides 232, HDL 39, LDL 99.  TSH 3.052.  Cardiac markers are negative x 3.   DISPOSITION:  The patient is being discharged home today in good condition.   FOLLOWUP PLANS AND APPOINTMENTS:  She has a followup appointment at [facility] later this week and she will be contacted with that  appointment time and date.  She will require a CBC at that time to re-  evaluate her white count.  She will return to [facility] on Friday January 20, 2005 at 6 a.m. for PCI of the LAD.  She will also require pacer clinic  followup January 30, 2005 at 9 a.m. at our [place] office and she has an  appointment to see Dr. Sherryl Manges on April 18, 2005 at 12:40 p.m.   We have  asked her to follow up with her primary care physician in the next week for  further evaluation of elevated blood sugars and presumed impaired glucose  tolerance during this admission.   DISCHARGE MEDICATIONS:  1.  Aspirin 325 mg daily.  2.  Benicar 40 mg daily.  3.  Lopressor 25 mg b.i.d.  4.  Lipitor 10 mg daily.  5.  Nitroglycerin 0.4 mg sublingual p.r.n. chest pain.   OUTSTANDING LABS AND STUDIES:  None.   DURATION OF DISCHARGE ENCOUNTER:  Fifty minutes including physician time.      Ok Anis, NP    ______________________________  Jonelle Sidle, M.D. [facility]    CRB/MEDQ  D:  01/08/2005  T:  01/09/2005  Job:  [id number]   cc:   Duke Salvia, M.D.  [address]  [address]  [place]  [place] [address]   Jonelle Sidle, M.D. [facility]  [name]  [place]  [place] [address]   Samuel Jester  Fax: [phone]

## 2010-05-27 NOTE — Cardiovascular Report (Signed)
NAMEAURORA, Brittany [name]               ACCOUNT NO.:  [id number]   MEDICAL RECORD NO.:  [id number]           PATIENT TYPE:   LOCATION:                                 FACILITY:   PHYSICIAN:  Bruce R. Juanda Chance, MD, [facility]   DATE OF BIRTH:   DATE OF PROCEDURE:  01/20/2005  DATE OF DISCHARGE:                              CARDIAC CATHETERIZATION   CLINICAL HISTORY:  [name] is 77 years old and was admitted with symptoms  of chest tightness and dyspnea on exertion.  He had undergone a diagnostic  catheterization in the outpatient laboratory and was found to have an 80%  stenosis in the mid LAD and was brought back for intervention.   PROCEDURE:  The procedure was performed via the right femoral artery using  an arterial sheath and a 6-French performed coronary catheters.  The patient  was given a Plavix load and was given Angiomax bolus and infusion.  We used  a Q 0.35 6-French guiding catheter with side holes.  A Prowater wire was  passed down the vessel without difficulty.  Using a 2.25 x 15-mm cutting  balloon, 3 inflations up to 8 atmospheres were performed.  Final diagnostic  studies were then performed through the guiding catheter.  The patient  tolerated the procedure well and left the laboratory in satisfactory  condition.  The right femoral artery was closed with Angio-Seal at the end  of the procedure.   RESULTS:  Initially, the stenosis was estimated at 80%, following cutting  balloon angioplasty, this improved to 10%.   CONCLUSION:  Percutaneous coronary intervention of the lesion in the mid  left anterior descending artery using cutting balloon angioplasty with  improvement of __________ narrowing from 80% to 10%.   DISPOSITION:  The patient was returned to the post angioplasty unit for  further observation.           ______________________________  Everardo Beals Juanda Chance, MD, [facility]     BRB/MEDQ  D:  09/07/2005  T:  09/08/2005  Job:  [id number]   cc:   Jonelle Sidle, MD  Samuel Jester

## 2010-05-27 NOTE — Discharge Summary (Signed)
NAMETEISHA, TROWBRIDGE NO.:  [id number]   MEDICAL RECORD NO.:  [id number]          PATIENT TYPE:  OIB   LOCATION:  6525                         FACILITY:  [facility]   PHYSICIAN:  Charlies Constable, M.D. [facility] DATE OF BIRTH:  1928-01-05   DATE OF ADMISSION:  01/20/2005  DATE OF DISCHARGE:  01/21/2005                                 DISCHARGE SUMMARY   PRIMARY CARDIOLOGIST:  Jonelle Sidle, M.D. [facility]   PRINCIPAL DIAGNOSES:  1.  Severe single-vessel coronary artery disease.      1.  Status post cutting balloon percutaneous transluminal coronary          angioplasty with left anterior descending artery on January 20, 2005.  2.  Left pleuritic chest pain.   SECONDARY DIAGNOSES:  1.  History of intermittent complete heart block.      1.  Status post dual-chamber pacemaker, January 06, 2005.  2.  Hypertension.  3.  Hyperlipidemia.  4.  Recent urinary tract infection.   REASON FOR ADMISSION:  Ms. Landowski is a 77 year old female, with no prior  history of coronary artery disease, who underwent recent elective cardiac  catheterization on December 29th revealing high-grade left anterior  descending artery disease with normal left ventricular function. She now  presents for elective percutaneous intervention.   HOSPITAL COURSE:  The patient underwent uncomplicated percutaneous  intervention, by Dr. Charlies Constable, with cutting balloon PTCA of an 80%  left anterior descending lesion, with no noted complications.   The following morning, the patient reported complaint of left-sided  pleuritic pain, worse with inspiration. However, she denies any anterior  chest discomfort. The patient was afebrile and there were no noted  complications of either the recent pacemaker site, which had only some mild  swelling with no active oozing, nor of the right groin.   The patient was sent for a 2-view chest x-ray for further evaluation  (results are currently pending).   Of note,  Dr. Juanda Chance recommended that the patient not be discharged on Plavix  (times one month) as he had originally suggested given that she was not  treated with a stent.   Labs at discharge revealed white blood cell count of 7.9, hemoglobin 11.8,  hematocrit 33.9, platelet count 327,000. Sodium 139, potassium 3.8, glucose  144, BUN 10, creatinine 0.7. CPK 21/0.9.   DISCHARGE MEDICATIONS:  1.  Metoprolol 25 mg b.i.d.  2.  Lipitor 10 mg q.h.s.  3.  Benicar 40 mg daily.  4.  Aspirin 325 mg daily.  5.  Nitrostat 0.4 mg as directed.   DISCHARGE INSTRUCTIONS:  1.  Follow up with Dr. Simona Huh on Friday, February 03, 2005, at 1 p.m.  2.  Follow up with Dr. Sherryl Manges on January 22nd [facility]) as      previously scheduled.  3.  Follow up with Dr. Samuel Jester as previously scheduled.   DISCHARGE DURATION TIME:  Less than 30 minutes.      Gene Serpe, P.A. [facility]    ______________________________  Charlies Constable, M.D. [facility]  GS/MEDQ  D:  01/21/2005  T:  01/22/2005  Job:  [id number]   cc:   [facility]  [place], [place]

## 2010-05-27 NOTE — Op Note (Signed)
NAMEBROOKIE, WAYMENT NO.:  [id number]   MEDICAL RECORD NO.:  [id number]          PATIENT TYPE:  INP   LOCATION:  2904                         FACILITY:  [facility]   PHYSICIAN:  Duke Salvia, M.D.  DATE OF BIRTH:  05/23/1927   DATE OF PROCEDURE:  01/06/2005  DATE OF DISCHARGE:                                 OPERATIVE REPORT   PREOPERATIVE DIAGNOSIS:  Intermittent complete heart block.   POSTOPERATIVE DIAGNOSIS:  Intermittent complete heart block.   OPERATION:  Dual chamber pacemaker implantation requiring temporary  transvenous pacing support.   CARDIOLOGIST:  Duke Salvia, M.D.   DESCRIPTION OF THE PROCEDURE:  Following the obtainment of informed consent  the patient was brought to the electrophysiology laboratory and placed on  the fluoroscopic table in the supine position.  After routine prepping and  draping of the left upper chest the __________  was infiltrated in the  prepectorals including the subclavicular region.  An incision was made and  carried down until I entered the prepectoral fascia using electrocautery and  sharp dissection.  A pocket was formed.  Similarly and hemostasis was  obtained.   Thereafter attention was turned to gain access to the extrathoracic left  subclavian vein, which was accomplished without difficulty, without  aspiration and without puncturing the artery.  Two separate venipunctures  were accomplished.  Guidewires were placed and retained, and a 0-silk suture  was placed in a __________  fashion __________ .  Subsequently a 7 French  tearaway introducer sheath was placed through which was passed a St. Jude  1688TC active fixation ventricular lead, serial number [id number].  Under  fluoroscopic guidance attempts were made to manipulate it into the right  ventricular septum, which I failed to be able to do , and then into the  right ventricular apex, which I also failed to be able to do.  I then  positioned this because  the patient had developed her recurrent heart rates  in the high 20s and hypotension.  We secured this as a temporary transvenous  pacer.  We then used over the second retained guidewire a 5076 Medtronic 52  cm active fixation lead serial number [id number].  Under fluoroscopic  guidance it was manipulated in the right ventricular apex where the bipolar  R wave was 11 mV, pacing impedance was 1261 ohms, threshold was 1-2 minutes,  and after screw deployment was still 1.3 volts at 0.5 msec.  Currented  threshold was no calculated.  There was no diaphragmatic pacing at 10 volts  and the current of injury was vigorous.   The new sleeve was secured to the prepectoral fascia and the previously  implanted transvenous sleeve was then loosened, and manipulated in the right  atrial appendage where the bipolar P wave was 3.2 mV with a pacing impedance  of 625 ohms and a threshold of 1.4 volts at 0.5 msec.  Again, the current  was not calculated.  This lead was secured to the prepectoral fascia as well  and then the leads were both then attached to a St. Jude Victory XL pulse  generator, serial number [id number], model 5816.  P synchronous pacing was  identified.   The pocket was copiously irrigated antibiotic containing saline solution.  Hemostasis was assured and the leads in the pulse generator were placed in  the pocket, and secured to the prepectoral fascia.  The wound was then  closed in three layers in the normal fashion.  The wound was washed, dried  and benzoin and Steri-strips dressing was applied.   COUNTS:  Needle count, sponge count and instrument count were correct at the  end of the procedure according to the staff.   The patient tolerated the procedure without apparent complications.   Because of the intercurrent development of pacer dependence, the patient  will be transferred to back to the CCU and tested for transcutaneous pacing  task for 24 hours.  We will anticipate discharge  in 48 hours if everything  remains stable.           ______________________________  Duke Salvia, M.D.     SCK/MEDQ  D:  01/06/2005  T:  01/08/2005  Job:  [id number]   cc:   [place] [facility]   [place]  Fax: [phone]

## 2010-05-30 ENCOUNTER — Ambulatory Visit (HOSPITAL_COMMUNITY)
Admission: RE | Admit: 2010-05-30 | Discharge: 2010-05-30 | Disposition: A | Discharge status: Home / Self Care | Payer: Medicare Other | Source: Ambulatory Visit | Attending: Ophthalmology | Admitting: Ophthalmology

## 2010-05-30 DIAGNOSIS — H26499 Other secondary cataract, unspecified eye: Secondary | ICD-10-CM | POA: Insufficient documentation

## 2010-05-30 DIAGNOSIS — E119 Type 2 diabetes mellitus without complications: Secondary | ICD-10-CM | POA: Insufficient documentation

## 2010-05-30 DIAGNOSIS — I10 Essential (primary) hypertension: Secondary | ICD-10-CM | POA: Insufficient documentation

## 2010-05-30 DIAGNOSIS — Z79899 Other long term (current) drug therapy: Secondary | ICD-10-CM | POA: Insufficient documentation

## 2010-06-16 ENCOUNTER — Encounter: Payer: Self-pay | Admitting: Internal Medicine

## 2010-06-16 DIAGNOSIS — I442 Atrioventricular block, complete: Secondary | ICD-10-CM

## 2010-07-25 ENCOUNTER — Encounter: Payer: Self-pay | Admitting: Internal Medicine

## 2010-08-03 ENCOUNTER — Ambulatory Visit (INDEPENDENT_AMBULATORY_CARE_PROVIDER_SITE_OTHER): Payer: Medicare Other | Admitting: *Deleted

## 2010-08-03 DIAGNOSIS — I442 Atrioventricular block, complete: Secondary | ICD-10-CM

## 2010-08-03 LAB — PACEMAKER DEVICE OBSERVATION
AL AMPLITUDE: 0.9 mv
AL THRESHOLD: 0.5 V
BATTERY VOLTAGE: 2.78 V
VENTRICULAR PACING PM: 99

## 2010-08-03 NOTE — Progress Notes (Signed)
Pacer check in clinic  

## 2010-09-15 ENCOUNTER — Encounter: Payer: Self-pay | Admitting: Internal Medicine

## 2010-09-15 DIAGNOSIS — I442 Atrioventricular block, complete: Secondary | ICD-10-CM

## 2010-11-02 ENCOUNTER — Encounter: Payer: Self-pay | Admitting: Internal Medicine

## 2010-11-11 ENCOUNTER — Encounter: Payer: Medicare Other | Admitting: Internal Medicine

## 2010-12-07 ENCOUNTER — Encounter: Payer: Medicare Other | Admitting: Internal Medicine

## 2010-12-15 ENCOUNTER — Encounter: Payer: Self-pay | Admitting: Internal Medicine

## 2010-12-15 DIAGNOSIS — I442 Atrioventricular block, complete: Secondary | ICD-10-CM

## 2011-01-17 ENCOUNTER — Encounter: Payer: Self-pay | Admitting: Internal Medicine

## 2011-01-17 ENCOUNTER — Ambulatory Visit (INDEPENDENT_AMBULATORY_CARE_PROVIDER_SITE_OTHER): Payer: Medicare Other | Admitting: Internal Medicine

## 2011-01-17 DIAGNOSIS — I10 Essential (primary) hypertension: Secondary | ICD-10-CM

## 2011-01-17 DIAGNOSIS — Z950 Presence of cardiac pacemaker: Secondary | ICD-10-CM

## 2011-01-17 DIAGNOSIS — I2510 Atherosclerotic heart disease of native coronary artery without angina pectoris: Secondary | ICD-10-CM

## 2011-01-17 DIAGNOSIS — I442 Atrioventricular block, complete: Secondary | ICD-10-CM

## 2011-01-17 LAB — PACEMAKER DEVICE OBSERVATION
AL AMPLITUDE: 0.4 mv
AL THRESHOLD: 0.75 V
BATTERY VOLTAGE: 2.76 V

## 2011-01-17 MED ORDER — CARVEDILOL 12.5 MG PO TABS: 12.5000 mg | ORAL_TABLET | Freq: Two times a day (BID) | ORAL | Status: DC

## 2011-01-17 NOTE — Assessment & Plan Note (Signed)
Stable post pacing 
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
## 2011-01-17 NOTE — Patient Instructions (Signed)
Your physician has recommended you make the following change in your medication:  1) Increase carvedilol to 12.5 mg one tablet by mouth twice daily.  Your physician wants you to follow-up in: 1 year with Dr. Graciela Husbands. You will receive a reminder letter in the mail two months in advance. If you don't receive a letter, please call our office to schedule the follow-up appointment.

## 2011-01-17 NOTE — Assessment & Plan Note (Signed)
The patient's device was interrogated.  The information was reviewed. No changes were made in the programming.    
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
## 2011-01-17 NOTE — Assessment & Plan Note (Signed)
Stable. Intolerant of statins.

## 2011-03-16 ENCOUNTER — Encounter: Payer: Self-pay | Admitting: Internal Medicine

## 2011-03-16 DIAGNOSIS — I442 Atrioventricular block, complete: Secondary | ICD-10-CM

## 2011-06-15 DIAGNOSIS — I442 Atrioventricular block, complete: Secondary | ICD-10-CM

## 2011-09-14 DIAGNOSIS — I442 Atrioventricular block, complete: Secondary | ICD-10-CM

## 2011-10-10 NOTE — Progress Notes (Signed)
This encounter was created in error - please disregard.

## 2011-12-14 ENCOUNTER — Encounter: Payer: Self-pay | Admitting: Internal Medicine

## 2011-12-14 DIAGNOSIS — I442 Atrioventricular block, complete: Secondary | ICD-10-CM

## 2012-01-18 ENCOUNTER — Encounter (HOSPITAL_COMMUNITY): Payer: Self-pay | Admitting: *Deleted

## 2012-01-18 ENCOUNTER — Encounter (HOSPITAL_COMMUNITY)
Admission: EM | Disposition: A | Discharge status: Skilled Nursing Facility | Payer: Self-pay | Source: Home / Self Care | Attending: Internal Medicine

## 2012-01-18 ENCOUNTER — Encounter (HOSPITAL_COMMUNITY): Payer: Self-pay | Admitting: Anesthesiology

## 2012-01-18 ENCOUNTER — Emergency Department (HOSPITAL_COMMUNITY): Payer: Medicare Other

## 2012-01-18 ENCOUNTER — Inpatient Hospital Stay (HOSPITAL_COMMUNITY): Payer: Medicare Other | Admitting: Anesthesiology

## 2012-01-18 ENCOUNTER — Inpatient Hospital Stay (HOSPITAL_COMMUNITY)
Admission: EM | Admit: 2012-01-18 | Discharge: 2012-01-22 | DRG: 482 | Disposition: A | Discharge status: Skilled Nursing Facility | Payer: Medicare Other | Attending: Internal Medicine | Admitting: Internal Medicine

## 2012-01-18 ENCOUNTER — Inpatient Hospital Stay (HOSPITAL_COMMUNITY): Payer: Medicare Other

## 2012-01-18 DIAGNOSIS — S72142A Displaced intertrochanteric fracture of left femur, initial encounter for closed fracture: Secondary | ICD-10-CM

## 2012-01-18 DIAGNOSIS — Z96659 Presence of unspecified artificial knee joint: Secondary | ICD-10-CM

## 2012-01-18 DIAGNOSIS — Z950 Presence of cardiac pacemaker: Secondary | ICD-10-CM | POA: Diagnosis present

## 2012-01-18 DIAGNOSIS — W010XXA Fall on same level from slipping, tripping and stumbling without subsequent striking against object, initial encounter: Secondary | ICD-10-CM | POA: Diagnosis present

## 2012-01-18 DIAGNOSIS — Y92009 Unspecified place in unspecified non-institutional (private) residence as the place of occurrence of the external cause: Secondary | ICD-10-CM

## 2012-01-18 DIAGNOSIS — Z888 Allergy status to other drugs, medicaments and biological substances status: Secondary | ICD-10-CM

## 2012-01-18 DIAGNOSIS — G4710 Hypersomnia, unspecified: Secondary | ICD-10-CM | POA: Diagnosis present

## 2012-01-18 DIAGNOSIS — S72001D Fracture of unspecified part of neck of right femur, subsequent encounter for closed fracture with routine healing: Secondary | ICD-10-CM

## 2012-01-18 DIAGNOSIS — I10 Essential (primary) hypertension: Secondary | ICD-10-CM | POA: Diagnosis present

## 2012-01-18 DIAGNOSIS — S72143A Displaced intertrochanteric fracture of unspecified femur, initial encounter for closed fracture: Principal | ICD-10-CM

## 2012-01-18 DIAGNOSIS — E782 Mixed hyperlipidemia: Secondary | ICD-10-CM | POA: Diagnosis present

## 2012-01-18 DIAGNOSIS — E785 Hyperlipidemia, unspecified: Secondary | ICD-10-CM | POA: Diagnosis present

## 2012-01-18 DIAGNOSIS — Z79899 Other long term (current) drug therapy: Secondary | ICD-10-CM

## 2012-01-18 DIAGNOSIS — I442 Atrioventricular block, complete: Secondary | ICD-10-CM

## 2012-01-18 DIAGNOSIS — D500 Iron deficiency anemia secondary to blood loss (chronic): Secondary | ICD-10-CM | POA: Diagnosis not present

## 2012-01-18 DIAGNOSIS — Z87891 Personal history of nicotine dependence: Secondary | ICD-10-CM

## 2012-01-18 DIAGNOSIS — I2510 Atherosclerotic heart disease of native coronary artery without angina pectoris: Secondary | ICD-10-CM | POA: Diagnosis present

## 2012-01-18 DIAGNOSIS — Z7982 Long term (current) use of aspirin: Secondary | ICD-10-CM

## 2012-01-18 DIAGNOSIS — S72002A Fracture of unspecified part of neck of left femur, initial encounter for closed fracture: Secondary | ICD-10-CM

## 2012-01-18 DIAGNOSIS — E119 Type 2 diabetes mellitus without complications: Secondary | ICD-10-CM | POA: Diagnosis present

## 2012-01-18 DIAGNOSIS — H409 Unspecified glaucoma: Secondary | ICD-10-CM | POA: Diagnosis present

## 2012-01-18 HISTORY — PX: FEMUR IM NAIL: SHX1597

## 2012-01-18 LAB — GLUCOSE, CAPILLARY
Glucose-Capillary: 150 mg/dL — ABNORMAL HIGH (ref 70–99)
Glucose-Capillary: 131 mg/dL — ABNORMAL HIGH (ref 70–99)
Glucose-Capillary: 157 mg/dL — ABNORMAL HIGH (ref 70–99)

## 2012-01-18 LAB — CBC WITH DIFFERENTIAL/PLATELET
Basophils Relative: 0 % (ref 0–1)
Eosinophils Absolute: 0.2 10*3/uL (ref 0.0–0.7)
Eosinophils Relative: 2 % (ref 0–5)
Lymphs Abs: 2 10*3/uL (ref 0.7–4.0)
MCH: 29.2 pg (ref 26.0–34.0)
MCHC: 33.6 g/dL (ref 30.0–36.0)
MCV: 86.9 fL (ref 78.0–100.0)
Monocytes Relative: 6 % (ref 3–12)
Platelets: 276 10*3/uL (ref 150–400)
RBC: 4.59 MIL/uL (ref 3.87–5.11)

## 2012-01-18 LAB — CBC
Hemoglobin: 12 g/dL (ref 12.0–15.0)
MCH: 28.8 pg (ref 26.0–34.0)
MCHC: 33 g/dL (ref 30.0–36.0)
MCV: 87.3 fL (ref 78.0–100.0)
Platelets: 234 10*3/uL (ref 150–400)
RBC: 4.17 MIL/uL (ref 3.87–5.11)

## 2012-01-18 LAB — BASIC METABOLIC PANEL
BUN: 18 mg/dL (ref 6–23)
Calcium: 10.3 mg/dL (ref 8.4–10.5)
GFR calc Af Amer: >90 mL/min (ref >90)
GFR calc non Af Amer: 83 mL/min — ABNORMAL LOW (ref >90)
Glucose, Bld: 238 mg/dL — ABNORMAL HIGH (ref 70–99)

## 2012-01-18 SURGERY — INSERTION, INTRAMEDULLARY ROD, FEMUR
Anesthesia: General | Site: Hip | Laterality: Left | Wound class: Clean

## 2012-01-18 MED ORDER — CEFAZOLIN SODIUM-DEXTROSE 2-3 GM-% IV SOLR
2.0000 g | Freq: Four times a day (QID) | INTRAVENOUS | Status: AC
  Administered 2012-01-18 – 2012-01-19 (×2): 2 g via INTRAVENOUS
  Filled 2012-01-18 (×2): qty 50

## 2012-01-18 MED ORDER — HYDROCODONE-ACETAMINOPHEN 5-325 MG PO TABS: 1.0000 | ORAL_TABLET | Freq: Four times a day (QID) | ORAL | Status: DC | PRN

## 2012-01-18 MED ORDER — ONDANSETRON HCL 4 MG/2ML IJ SOLN: 4.0000 mg | Freq: Four times a day (QID) | INTRAMUSCULAR | Status: DC | PRN

## 2012-01-18 MED ORDER — LACTATED RINGERS IV SOLN
INTRAVENOUS | Status: DC | PRN
  Administered 2012-01-18: 13:00:00 via INTRAVENOUS

## 2012-01-18 MED ORDER — SODIUM CHLORIDE 0.9 % IV SOLN: INTRAVENOUS | Status: AC

## 2012-01-18 MED ORDER — ACETAMINOPHEN 325 MG PO TABS
650.0000 mg | ORAL_TABLET | Freq: Four times a day (QID) | ORAL | Status: DC | PRN
  Administered 2012-01-19 – 2012-01-20 (×2): 650 mg via ORAL
  Filled 2012-01-18 (×2): qty 2

## 2012-01-18 MED ORDER — FENTANYL CITRATE 0.05 MG/ML IJ SOLN
INTRAMUSCULAR | Status: DC | PRN
  Administered 2012-01-18: 100 ug via INTRAVENOUS
  Administered 2012-01-18 (×2): 50 ug via INTRAVENOUS

## 2012-01-18 MED ORDER — ACETAMINOPHEN 10 MG/ML IV SOLN
INTRAVENOUS | Status: DC | PRN
  Administered 2012-01-18: 1000 mg via INTRAVENOUS

## 2012-01-18 MED ORDER — HYDROMORPHONE HCL PF 1 MG/ML IJ SOLN
0.2500 mg | INTRAMUSCULAR | Status: DC | PRN
  Administered 2012-01-18 (×4): 0.5 mg via INTRAVENOUS
  Filled 2012-01-18: qty 1

## 2012-01-18 MED ORDER — ENOXAPARIN SODIUM 30 MG/0.3ML ~~LOC~~ SOLN
30.0000 mg | SUBCUTANEOUS | Status: DC
  Administered 2012-01-19 – 2012-01-20 (×2): 30 mg via SUBCUTANEOUS
  Filled 2012-01-18 (×3): qty 0.3

## 2012-01-18 MED ORDER — OXYCODONE HCL 5 MG/5ML PO SOLN: 5.0000 mg | Freq: Once | ORAL | Status: DC | PRN

## 2012-01-18 MED ORDER — MORPHINE SULFATE 4 MG/ML IJ SOLN
6.0000 mg | Freq: Once | INTRAMUSCULAR | Status: AC
  Administered 2012-01-18: 6 mg via INTRAVENOUS
  Filled 2012-01-18: qty 2

## 2012-01-18 MED ORDER — MENTHOL 3 MG MT LOZG
1.0000 | LOZENGE | OROMUCOSAL | Status: DC | PRN
  Filled 2012-01-18: qty 9

## 2012-01-18 MED ORDER — OXYCODONE HCL 5 MG PO TABS: 5.0000 mg | ORAL_TABLET | Freq: Once | ORAL | Status: AC | PRN

## 2012-01-18 MED ORDER — CHLORHEXIDINE GLUCONATE 4 % EX LIQD
60.0000 mL | Freq: Once | CUTANEOUS | Status: DC
  Filled 2012-01-18: qty 60

## 2012-01-18 MED ORDER — BISACODYL 5 MG PO TBEC: 5.0000 mg | DELAYED_RELEASE_TABLET | Freq: Every day | ORAL | Status: DC | PRN

## 2012-01-18 MED ORDER — LIDOCAINE HCL (CARDIAC) 20 MG/ML IV SOLN
INTRAVENOUS | Status: DC | PRN
  Administered 2012-01-18: 70 mg via INTRAVENOUS

## 2012-01-18 MED ORDER — PROPOFOL 10 MG/ML IV BOLUS
INTRAVENOUS | Status: DC | PRN
  Administered 2012-01-18: 120 mg via INTRAVENOUS

## 2012-01-18 MED ORDER — HYDROCODONE-ACETAMINOPHEN 5-325 MG PO TABS
1.0000 | ORAL_TABLET | Freq: Four times a day (QID) | ORAL | Status: DC | PRN
  Administered 2012-01-18 – 2012-01-19 (×2): 2 via ORAL
  Administered 2012-01-19 – 2012-01-20 (×4): 1 via ORAL
  Administered 2012-01-20 – 2012-01-21 (×3): 2 via ORAL
  Filled 2012-01-18 (×2): qty 2
  Filled 2012-01-18 (×4): qty 1
  Filled 2012-01-18 (×3): qty 2

## 2012-01-18 MED ORDER — ALBUMIN HUMAN 5 % IV SOLN
INTRAVENOUS | Status: DC | PRN
  Administered 2012-01-18: 14:00:00 via INTRAVENOUS

## 2012-01-18 MED ORDER — METOCLOPRAMIDE HCL 5 MG/ML IJ SOLN: 10.0000 mg | Freq: Once | INTRAMUSCULAR | Status: DC | PRN

## 2012-01-18 MED ORDER — CEFAZOLIN SODIUM-DEXTROSE 2-3 GM-% IV SOLR
2.0000 g | INTRAVENOUS | Status: DC
  Filled 2012-01-18: qty 50

## 2012-01-18 MED ORDER — MORPHINE SULFATE 4 MG/ML IJ SOLN: 6.0000 mg | Freq: Once | INTRAMUSCULAR | Status: DC

## 2012-01-18 MED ORDER — ONDANSETRON HCL 4 MG/2ML IJ SOLN: 4.0000 mg | Freq: Three times a day (TID) | INTRAMUSCULAR | Status: AC | PRN

## 2012-01-18 MED ORDER — OXYCODONE HCL 5 MG PO TABS: 5.0000 mg | ORAL_TABLET | Freq: Once | ORAL | Status: DC | PRN

## 2012-01-18 MED ORDER — LACTATED RINGERS IV SOLN
INTRAVENOUS | Status: DC
  Administered 2012-01-18: 12:00:00 via INTRAVENOUS

## 2012-01-18 MED ORDER — 0.9 % SODIUM CHLORIDE (POUR BTL) OPTIME
TOPICAL | Status: DC | PRN
  Administered 2012-01-18: 1000 mL

## 2012-01-18 MED ORDER — CEFAZOLIN SODIUM-DEXTROSE 2-3 GM-% IV SOLR
INTRAVENOUS | Status: DC | PRN
  Administered 2012-01-18: 2 g via INTRAVENOUS

## 2012-01-18 MED ORDER — B COMPLEX-C PO TABS
1.0000 | ORAL_TABLET | Freq: Every day | ORAL | Status: DC
  Administered 2012-01-19 – 2012-01-22 (×4): 1 via ORAL
  Filled 2012-01-18 (×5): qty 1

## 2012-01-18 MED ORDER — HYDROMORPHONE HCL PF 1 MG/ML IJ SOLN
0.5000 mg | INTRAMUSCULAR | Status: AC | PRN
  Administered 2012-01-18: 0.5 mg via INTRAVENOUS
  Filled 2012-01-18: qty 1

## 2012-01-18 MED ORDER — PHENYLEPHRINE HCL 10 MG/ML IJ SOLN
INTRAMUSCULAR | Status: DC | PRN
  Administered 2012-01-18 (×2): 80 ug via INTRAVENOUS

## 2012-01-18 MED ORDER — SUCCINYLCHOLINE CHLORIDE 20 MG/ML IJ SOLN
INTRAMUSCULAR | Status: DC | PRN
  Administered 2012-01-18: 100 mg via INTRAVENOUS

## 2012-01-18 MED ORDER — FLEET ENEMA 7-19 GM/118ML RE ENEM
1.0000 | ENEMA | Freq: Once | RECTAL | Status: AC | PRN
  Filled 2012-01-18: qty 1

## 2012-01-18 MED ORDER — ACETAMINOPHEN 650 MG RE SUPP: 650.0000 mg | Freq: Four times a day (QID) | RECTAL | Status: DC | PRN

## 2012-01-18 MED ORDER — INSULIN ASPART 100 UNIT/ML ~~LOC~~ SOLN
0.0000 [IU] | Freq: Three times a day (TID) | SUBCUTANEOUS | Status: DC
  Administered 2012-01-19 (×3): 2 [IU] via SUBCUTANEOUS
  Administered 2012-01-20: 5 [IU] via SUBCUTANEOUS
  Administered 2012-01-20: 2 [IU] via SUBCUTANEOUS
  Administered 2012-01-20: 5 [IU] via SUBCUTANEOUS
  Administered 2012-01-21: 2 [IU] via SUBCUTANEOUS
  Administered 2012-01-21: 1 [IU] via SUBCUTANEOUS
  Administered 2012-01-21 – 2012-01-22 (×2): 2 [IU] via SUBCUTANEOUS
  Administered 2012-01-22: 1 [IU] via SUBCUTANEOUS

## 2012-01-18 MED ORDER — ASPIRIN EC 81 MG PO TBEC
81.0000 mg | DELAYED_RELEASE_TABLET | Freq: Every day | ORAL | Status: DC
  Administered 2012-01-18 – 2012-01-21 (×4): 81 mg via ORAL
  Filled 2012-01-18 (×6): qty 1

## 2012-01-18 MED ORDER — SENNOSIDES-DOCUSATE SODIUM 8.6-50 MG PO TABS
1.0000 | ORAL_TABLET | Freq: Every evening | ORAL | Status: DC | PRN
  Filled 2012-01-18: qty 1

## 2012-01-18 MED ORDER — METOCLOPRAMIDE HCL 5 MG/ML IJ SOLN
5.0000 mg | Freq: Three times a day (TID) | INTRAMUSCULAR | Status: DC | PRN
  Filled 2012-01-18: qty 2

## 2012-01-18 MED ORDER — MORPHINE SULFATE 2 MG/ML IJ SOLN: 1.0000 mg | INTRAMUSCULAR | Status: DC | PRN

## 2012-01-18 MED ORDER — ONDANSETRON HCL 4 MG PO TABS: 4.0000 mg | ORAL_TABLET | Freq: Four times a day (QID) | ORAL | Status: DC | PRN

## 2012-01-18 MED ORDER — MIDAZOLAM HCL 5 MG/5ML IJ SOLN
INTRAMUSCULAR | Status: DC | PRN
  Administered 2012-01-18: 1 mg via INTRAVENOUS

## 2012-01-18 MED ORDER — METOCLOPRAMIDE HCL 5 MG PO TABS
5.0000 mg | ORAL_TABLET | Freq: Three times a day (TID) | ORAL | Status: DC | PRN
  Filled 2012-01-18: qty 2

## 2012-01-18 MED ORDER — MORPHINE SULFATE 2 MG/ML IJ SOLN: 0.5000 mg | INTRAMUSCULAR | Status: DC | PRN

## 2012-01-18 MED ORDER — ONDANSETRON HCL 4 MG/2ML IJ SOLN
4.0000 mg | Freq: Once | INTRAMUSCULAR | Status: AC
  Administered 2012-01-18: 4 mg via INTRAVENOUS
  Filled 2012-01-18: qty 2

## 2012-01-18 MED ORDER — OXYCODONE HCL 5 MG/5ML PO SOLN: 5.0000 mg | Freq: Once | ORAL | Status: AC | PRN

## 2012-01-18 MED ORDER — ONDANSETRON HCL 4 MG/2ML IJ SOLN
INTRAMUSCULAR | Status: DC | PRN
  Administered 2012-01-18: 4 mg via INTRAVENOUS

## 2012-01-18 MED ORDER — CARVEDILOL 12.5 MG PO TABS
12.5000 mg | ORAL_TABLET | Freq: Two times a day (BID) | ORAL | Status: DC
  Administered 2012-01-19 – 2012-01-22 (×7): 12.5 mg via ORAL
  Filled 2012-01-18 (×11): qty 1

## 2012-01-18 MED ORDER — DOCUSATE SODIUM 100 MG PO CAPS
100.0000 mg | ORAL_CAPSULE | Freq: Two times a day (BID) | ORAL | Status: DC
  Administered 2012-01-18 – 2012-01-22 (×8): 100 mg via ORAL
  Filled 2012-01-18 (×10): qty 1

## 2012-01-18 MED ORDER — PHENOL 1.4 % MT LIQD
1.0000 | OROMUCOSAL | Status: DC | PRN
  Filled 2012-01-18: qty 177

## 2012-01-18 SURGICAL SUPPLY — 41 items
BANDAGE CONFORM 3  STR LF (GAUZE/BANDAGES/DRESSINGS) ×4 IMPLANT
BIT DRILL CANN LG 4.3MM (BIT) ×1 IMPLANT
BLADE SURG 15 STRL LF DISP TIS (BLADE) ×1 IMPLANT
BLADE SURG 15 STRL SS (BLADE) ×1
CLOSURE STERI-STRIP 1/4X4 (GAUZE/BANDAGES/DRESSINGS) ×2 IMPLANT
CLOTH BEACON ORANGE TIMEOUT ST (SAFETY) ×2 IMPLANT
DRAPE STERI IOBAN 125X83 (DRAPES) ×2 IMPLANT
DRILL BIT CANN LG 4.3MM (BIT) ×2
DRSG ADAPTIC 3X8 NADH LF (GAUZE/BANDAGES/DRESSINGS) ×2 IMPLANT
DRSG MEPILEX BORDER 4X4 (GAUZE/BANDAGES/DRESSINGS) ×2 IMPLANT
DRSG MEPILEX BORDER 4X8 (GAUZE/BANDAGES/DRESSINGS) ×2 IMPLANT
ELECT REM PT RETURN 9FT ADLT (ELECTROSURGICAL) ×2
ELECTRODE REM PT RTRN 9FT ADLT (ELECTROSURGICAL) ×1 IMPLANT
GLOVE BIO SURGEON STRL SZ7.5 (GLOVE) ×2 IMPLANT
GLOVE BIO SURGEON STRL SZ8 (GLOVE) ×2 IMPLANT
GLOVE EUDERMIC 7 POWDERFREE (GLOVE) ×2 IMPLANT
GLOVE SS BIOGEL STRL SZ 7.5 (GLOVE) ×1 IMPLANT
GLOVE SUPERSENSE BIOGEL SZ 7.5 (GLOVE) ×1
GOWN STRL NON-REIN LRG LVL3 (GOWN DISPOSABLE) ×2 IMPLANT
GOWN STRL REIN XL XLG (GOWN DISPOSABLE) ×4 IMPLANT
GUIDEWIRE BALL NOSE 100CM (WIRE) ×2 IMPLANT
HIP FRAC NAIL LAG SCR 10.5X100 (Orthopedic Implant) ×1 IMPLANT
KIT BASIN OR (CUSTOM PROCEDURE TRAY) ×2 IMPLANT
KIT ROOM TURNOVER OR (KITS) ×2 IMPLANT
MANIFOLD NEPTUNE II (INSTRUMENTS) ×2 IMPLANT
NAIL HIP FRACT 130D 11X180 (Screw) ×2 IMPLANT
NS IRRIG 1000ML POUR BTL (IV SOLUTION) ×2 IMPLANT
PACK GENERAL/GYN (CUSTOM PROCEDURE TRAY) ×2 IMPLANT
PAD ARMBOARD 7.5X6 YLW CONV (MISCELLANEOUS) ×4 IMPLANT
SCREW BONE CORTICAL 5.0X3 (Screw) ×2 IMPLANT
SCREW CANN THRD AFF 10.5X100 (Orthopedic Implant) ×1 IMPLANT
SPONGE LAP 18X18 X RAY DECT (DISPOSABLE) ×2 IMPLANT
STAPLER VISISTAT 35W (STAPLE) ×4 IMPLANT
STRIP CLOSURE SKIN 1/2X4 (GAUZE/BANDAGES/DRESSINGS) ×2 IMPLANT
SUT MNCRL AB 3-0 PS2 18 (SUTURE) ×2 IMPLANT
SUT VIC AB 0 CT1 27 (SUTURE) ×1
SUT VIC AB 0 CT1 27XBRD ANBCTR (SUTURE) ×1 IMPLANT
SUT VIC AB 2-0 CT1 27 (SUTURE) ×1
SUT VIC AB 2-0 CT1 TAPERPNT 27 (SUTURE) ×1 IMPLANT
TRAY FOLEY CATH 14FR (SET/KITS/TRAYS/PACK) IMPLANT
WATER STERILE IRR 1000ML POUR (IV SOLUTION) ×2 IMPLANT

## 2012-01-18 NOTE — ED Notes (Signed)
Patient with fall this am, c/o left hip pain, patient states fell on left hip,

## 2012-01-18 NOTE — Progress Notes (Signed)
Lab at bedside drawing labs, pt will be transported in bed on monitor to rm 2002-1

## 2012-01-18 NOTE — Transfer of Care (Signed)
Immediate Anesthesia Transfer of Care Note  Patient: Brittany [name]  Procedure(s) Performed: Procedure(s) (LRB) with comments: INTRAMEDULLARY (IM) NAIL FEMORAL (Left)  Patient Location: PACU  Anesthesia Type:General  Level of Consciousness: awake, alert , oriented and patient cooperative  Airway & Oxygen Therapy: Patient Spontanous Breathing and Patient connected to nasal cannula oxygen  Post-op Assessment: Report given to PACU RN, Post -op Vital signs reviewed and stable and Patient moving all extremities  Post vital signs: Reviewed and stable  Complications: No apparent anesthesia complications

## 2012-01-18 NOTE — ED Notes (Addendum)
Pt arrived on LSB with c-collar intact via [facility]

## 2012-01-18 NOTE — Care Management Note (Signed)
    Page 1 of 1   01/22/2012     2:37:59 PM   CARE MANAGEMENT NOTE 01/22/2012  Patient:  Brittany [name], Brittany [name]   Account Number:  [id number]  Date Initiated:  01/18/2012  Documentation initiated by:  Myrl Bynum  Subjective/Objective Assessment:   PT ADM ON 01/18/12 WITH CLOSED HIP FRACTURE REQUIRING ORIF.     Action/Plan:   WILL CONSULT CSW SHOULD PT NEED ST-SNF FOR REHAB AT DC. PT/OT CONSULTS PENDING.   Anticipated DC Date:  01/22/2012   Anticipated DC Plan:  SKILLED NURSING FACILITY  In-house referral  Clinical Social Worker      DC Planning Services  CM consult      Choice offered to / List presented to:             Status of service:  Completed, signed off Medicare Important Message given?   (If response is "NO", the following Medicare IM given date fields will be blank) Date Medicare IM given:   Date Additional Medicare IM given:    Discharge Disposition:  SKILLED NURSING FACILITY  Per UR Regulation:  Reviewed for med. necessity/level of care/duration of stay  If discussed at Long Length of Stay Meetings, dates discussed:    Comments:  01/22/12 Kiet Geer,RN,BSN [phone] PT DISCHARGING TO SNF TODAY, PER CSW ARRANGEMENTS.

## 2012-01-18 NOTE — Consult Note (Signed)
Reason for Consult:LEft hip pain Referring Physician: EDP  HPI: Brittany [name] is an 79 y.o. female with PMH significant of AV block sp pacemaker, CAD, diabetes, present to hospital complaining of left hip pain after a fall. Patient was in her usual state of health when she stand up, turn around, loss balance, she slid her had on the wall and fell on the floor. No chest pain, dyspnea, lightheaded during episode. Her pacemake was interrogated, she think 3 months ago and was fine per patient. She denies chest pain or dyspnea on exertion.    Past Medical History  Diagnosis Date  . CAD (coronary artery disease)   . Pacemaker   . AV block   . Hyperlipidemia   . Hypertension   . Diabetes mellitus     Type II  . Glaucoma(365)     Past Surgical History  Procedure Date  . Tonsillectomy 1937  . Breast biopsy 1991  . Left total knee replacement 1996  . Pacemaker insertion     [employer]    Family History  Problem Relation Age of Onset  . Cancer    . Coronary artery disease    . Stroke    . Diabetes    . Hypertension      Social History:  reports that she quit smoking about 61 years ago. Her smoking use included Cigarettes. She has never used smokeless tobacco. She reports that she drinks alcohol. She reports that she does not use illicit drugs.  Allergies:  Allergies  Allergen Reactions  . Ace Inhibitors     REACTION: cough and sore throat  . Atorvastatin     REACTION: Myalgia  . Ciprofloxacin Other (See Comments)    REACTION:  unknown  . Crestor (Rosuvastatin) Other (See Comments)    REACTION:  unknown  . Plavix (Clopidogrel Bisulfate) Other (See Comments)    REACTION:  unknown    Medications:  Prior to Admission:  Prescriptions prior to admission  Medication Sig Dispense Refill  . aspirin EC 81 MG tablet Take 81 mg by mouth at bedtime.      . Aspirin-Caffeine (BAYER BACK & BODY PAIN EX ST PO) Take 1-2 tablets by mouth daily as needed. For pain      . B Complex-C  (B-COMPLEX WITH VITAMIN C) tablet Take 1 tablet by mouth daily.      . carvedilol (COREG) 12.5 MG tablet Take 12.5 mg by mouth 2 (two) times daily with a meal.      . Cinnamon 500 MG capsule Take 1,000 mg by mouth 2 (two) times daily.      . Coenzyme Q10 (CO Q 10) 100 MG CAPS Take 100 mg by mouth daily.      . metFORMIN (GLUCOPHAGE-XR) 500 MG 24 hr tablet Take 1,000 mg by mouth 2 (two) times daily.      . Multiple Vitamin (MULTIVITAMIN WITH MINERALS) TABS Take 1 tablet by mouth daily.      Marland Kitchen OVER THE COUNTER MEDICATION Take 1 tablet by mouth daily. Mega Red Extra Strength.      . valsartan-hydrochlorothiazide (DIOVAN-HCT) 160-12.5 MG per tablet Take 1 tablet by mouth daily.      . vitamin E 1000 UNIT capsule Take 1,000 Units by mouth daily.        Results for orders placed during the hospital encounter of 01/18/12 (from the past 48 hour(s))  CBC WITH DIFFERENTIAL     Status: Abnormal   Collection Time   01/18/12  8:26  AM      Component Value Range Comment   WBC 11.9 (*) 4.0 - 10.5 K/uL    RBC 4.59  3.87 - 5.11 MIL/uL    Hemoglobin 13.4  12.0 - 15.0 g/dL    HCT 45.4  09.8 - 11.9 %    MCV 86.9  78.0 - 100.0 fL    MCH 29.2  26.0 - 34.0 pg    MCHC 33.6  30.0 - 36.0 g/dL    RDW 14.7  82.9 - 56.2 %    Platelets 276  150 - 400 K/uL    Neutrophils Relative 75  43 - 77 %    Neutro Abs 8.9 (*) 1.7 - 7.7 K/uL    Lymphocytes Relative 17  12 - 46 %    Lymphs Abs 2.0  0.7 - 4.0 K/uL    Monocytes Relative 6  3 - 12 %    Monocytes Absolute 0.7  0.1 - 1.0 K/uL    Eosinophils Relative 2  0 - 5 %    Eosinophils Absolute 0.2  0.0 - 0.7 K/uL    Basophils Relative 0  0 - 1 %    Basophils Absolute 0.0  0.0 - 0.1 K/uL   BASIC METABOLIC PANEL     Status: Abnormal   Collection Time   01/18/12  8:26 AM      Component Value Range Comment   Sodium 138  135 - 145 mEq/L    Potassium 4.2  3.5 - 5.1 mEq/L    Chloride 100  96 - 112 mEq/L    CO2 27  19 - 32 mEq/L    Glucose, Bld 238 (*) 70 - 99 mg/dL    BUN 18   6 - 23 mg/dL    Creatinine, Ser 1.30  0.50 - 1.10 mg/dL    Calcium 86.5  8.4 - 10.5 mg/dL    GFR calc non Af Amer 83 (*) >90 mL/min    GFR calc Af Amer >90  >90 mL/min   GLUCOSE, CAPILLARY     Status: Abnormal   Collection Time   01/18/12 12:19 PM      Component Value Range Comment   Glucose-Capillary 150 (*) 70 - 99 mg/dL     Dg Chest 1 View  07/17/4694  *RADIOLOGY REPORT*  Clinical Data: Fall, left hip pain  CHEST - 1 VIEW  Comparison: 01/21/2005  Findings: Chronic interstitial markings.  No focal consolidation.  Stable cardiomegaly.  Left subclavian pacemaker.  IMPRESSION: No evidence of acute cardiopulmonary disease.   Original Report Authenticated By: Charline Bills, M.D.    Dg Hip Complete Left  01/18/2012  *RADIOLOGY REPORT*  Clinical Data: Fall, left hip pain  LEFT HIP - COMPLETE 2+ VIEW  Comparison: None.  Findings: Possible nondisplaced/incomplete intertrochanteric left hip fracture, although only well visualized on a single view.  Bilateral hip joint spaces are symmetric.  Visualized bony pelvis appears intact.  IMPRESSION: Possible nondisplaced/incomplete intertrochanteric left hip fracture.  If the patient is non-weight-bearing, consider CT or MRI for confirmation.   Original Report Authenticated By: Charline Bills, M.D.    Ct Hip Left Wo Contrast  01/18/2012  *RADIOLOGY REPORT*  Clinical Data: Left hip pain status post fall today.  CT OF THE LEFT HIP WITHOUT CONTRAST  Technique:  Multidetector CT imaging was performed according to the standard protocol. Multiplanar CT image reconstructions were also generated.  Comparison: Radiographs same date.  Findings: As suspected on earlier radiographs, there is an intertrochanteric left femur fracture.  This appears mildly displaced.  The left femoral head is intact.  There is no evidence of dislocation or femoral head avascular necrosis.  The visualized inferior left hemi pelvis is intact.  There are mild degenerative changes at the symphysis  pubis.  No significant proximal thigh or pelvic hematoma is identified. There are scattered vascular calcifications and a moderate amount of stool within the rectum.  IMPRESSION: Mildly displaced intertrochanteric left femur fracture.   Original Report Authenticated By: Carey Bullocks, M.D.      Vitals Temp:  [97.5 F (36.4 C)-98.4 F (36.9 C)] 98.4 F (36.9 C) (01/09 0810) Pulse Rate:  [58-59] 58  (01/09 1015) Resp:  [14-22] 14  (01/09 1100) BP: (139-178)/(65-91) 139/82 mmHg (01/09 1100) SpO2:  [98 %-100 %] 100 % (01/09 1015) There is no height or weight on file to calculate BMI.  Physical Exam: extreme pain with palpation about left hip. Other extreme ties no gross bone or joint instability or pain. N/V intact distally     Assessment/Plan: Impression: Left intertrochanteric hip fracture Treatment: Plan for ORIF. Risks, benefits, options reviewed. To OR  Jaquae Rieves M 01/18/2012, 1:50 PM

## 2012-01-18 NOTE — ED Notes (Signed)
Pt is not in the room at this time.

## 2012-01-18 NOTE — H&P (Signed)
[facility] History and Physical  Brittany [name] AVW:[id number] DOB: 03-Oct-1927 DOA: 01/18/2012  Referring physician: Dr Anitra Lauth PCP: Brittany Lemons, DO    Chief Complaint: left hip pain.  HPI: Brittany [name] is a 79 y.o. female with PMH significant of AV block sp pacemaker, CAD, diabetes, present to hospital complaining of left hip pain after a fall. Patient was in her usual state of health when she stand up, turn around, loss balance, she slid her had on the wall and fell on the floor. No chest pain, dyspnea, lightheaded during episode. Her pacemake was interrogated, she think 3 months ago and was fine per patient. She denies chest pain or dyspnea on exertion.   Review of Systems: The patient denies anorexia, fever, weight loss,, vision loss, decreased hearing, hoarseness, chest pain, syncope, dyspnea on exertion, peripheral edema, balance deficits, hemoptysis, abdominal pain, melena, hematochezia, severe indigestion/heartburn, hematuria, incontinence, genital sores, muscle weakness, suspicious skin lesions, transient blindness, difficulty walking, depression, unusual weight change, abnormal bleeding, enlarged lymph nodes, angioedema, and breast masses.    Past Medical History  Diagnosis Date  . CAD (coronary artery disease)   . Pacemaker   . AV block   . Hyperlipidemia   . Hypertension   . Diabetes mellitus     Type II  . Glaucoma(365)    Past Surgical History  Procedure Date  . Tonsillectomy 1937  . Breast biopsy 1991  . Left total knee replacement 1996  . Pacemaker insertion     [employer]   Social History:  reports that she quit smoking about 61 years ago. Her smoking use included Cigarettes. She has never used smokeless tobacco. She reports that she drinks alcohol. She reports that she does not use illicit drugs.Lives at home with daughter.    Allergies  Allergen Reactions  . Ace Inhibitors     REACTION: cough and sore throat  . Atorvastatin     REACTION: Myalgia  .  Ciprofloxacin Other (See Comments)    REACTION:  unknown  . Crestor (Rosuvastatin) Other (See Comments)    REACTION:  unknown  . Plavix (Clopidogrel Bisulfate) Other (See Comments)    REACTION:  unknown    Family History  Problem Relation Age of Onset  . Cancer    . Coronary artery disease    . Stroke    . Diabetes    . Hypertension      Prior to Admission medications   Medication Sig Start Date End Date Taking? Authorizing Provider  aspirin EC 81 MG tablet Take 81 mg by mouth at bedtime.   Yes Historical Provider, MD  Aspirin-Caffeine (BAYER BACK & BODY PAIN EX ST PO) Take 1-2 tablets by mouth daily as needed. For pain   Yes Historical Provider, MD  B Complex-C (B-COMPLEX WITH VITAMIN C) tablet Take 1 tablet by mouth daily.   Yes Historical Provider, MD  carvedilol (COREG) 12.5 MG tablet Take 12.5 mg by mouth 2 (two) times daily with a meal.   Yes Historical Provider, MD  Cinnamon 500 MG capsule Take 1,000 mg by mouth 2 (two) times daily.   Yes Historical Provider, MD  Coenzyme Q10 (CO Q 10) 100 MG CAPS Take 100 mg by mouth daily.   Yes Historical Provider, MD  metFORMIN (GLUCOPHAGE-XR) 500 MG 24 hr tablet Take 1,000 mg by mouth 2 (two) times daily.   Yes Historical Provider, MD  Multiple Vitamin (MULTIVITAMIN WITH MINERALS) TABS Take 1 tablet by mouth daily.   Yes Historical Provider,  MD  OVER THE COUNTER MEDICATION Take 1 tablet by mouth daily. Mega Red Extra Strength.   Yes Historical Provider, MD  valsartan-hydrochlorothiazide (DIOVAN-HCT) 160-12.5 MG per tablet Take 1 tablet by mouth daily.   Yes Historical Provider, MD  vitamin E 1000 UNIT capsule Take 1,000 Units by mouth daily.   Yes Historical Provider, MD   Physical Exam: Filed Vitals:   01/18/12 0806 01/18/12 0810 01/18/12 1015  BP: 178/91 178/91 154/65  Pulse: 59  58  Temp: 97.5 F (36.4 C) 98.4 F (36.9 C)   TempSrc: Oral Oral   Resp: 18 22 15   SpO2: 98%  100%    General Appearance:    Alert, cooperative, no  distress, appears stated age  Head:    Normocephalic, without obvious abnormality, atraumatic  Eyes:    PERRL, conjunctiva/corneas clear, EOM's intact,    Ears:    Normal TM's and external ear canals, both ears  Nose:   Nares normal, septum midline, mucosa normal, no drainage    or sinus tenderness  Throat:   Lips, mucosa, and tongue normal; teeth and gums normal  Neck:   Supple, symmetrical, trachea midline, no adenopathy;    thyroid:  no enlargement/tenderness/nodules; no carotid   bruit or JVD  Back:     Symmetric, no curvature, ROM normal, no CVA tenderness  Lungs:     Clear to auscultation bilaterally, respirations unlabored  Chest Wall:    No tenderness or deformity   Heart:    Regular rate and rhythm, S1 and S2 normal, no murmur, rub   or gallop     Abdomen:     Soft, non-tender, bowel sounds active all four quadrants,    no masses, no organomegaly        Extremities:   , no cyanosis or edema, unable to move left leg due to pain.   Pulses:   2+ and symmetric all extremities  Skin:   Skin color, texture, turgor normal, no rashes or lesions  Lymph nodes:   Cervical, supraclavicular, and axillary nodes normal  Neurologic:   CNII-XII intact, normal strength, sensation and reflexes    throughout    Labs on Admission:  Basic Metabolic Panel:  Lab 01/18/12 8295  NA 138  K 4.2  CL 100  CO2 27  GLUCOSE 238*  BUN 18  CREATININE 0.60  CALCIUM 10.3  MG --  PHOS --  CBC:  Lab 01/18/12 0826  WBC 11.9*  NEUTROABS 8.9*  HGB 13.4  HCT 39.9  MCV 86.9  PLT 276   Cardiac Enzymes: No results found for this basename: CKTOTAL:5,CKMB:5,CKMBINDEX:5,TROPONINI:5 in the last 168 hours  BNP (last 3 results) No results found for this basename: PROBNP:3 in the last 8760 hours CBG: No results found for this basename: GLUCAP:5 in the last 168 hours  Radiological Exams on Admission: Dg Chest 1 View  01/18/2012  *RADIOLOGY REPORT*  Clinical Data: Fall, left hip pain  CHEST - 1 VIEW   Comparison: 01/21/2005  Findings: Chronic interstitial markings.  No focal consolidation.  Stable cardiomegaly.  Left subclavian pacemaker.  IMPRESSION: No evidence of acute cardiopulmonary disease.   Original Report Authenticated By: Charline Bills, M.D.    Dg Hip Complete Left  01/18/2012  *RADIOLOGY REPORT*  Clinical Data: Fall, left hip pain  LEFT HIP - COMPLETE 2+ VIEW  Comparison: None.  Findings: Possible nondisplaced/incomplete intertrochanteric left hip fracture, although only well visualized on a single view.  Bilateral hip joint spaces are symmetric.  Visualized bony  pelvis appears intact.  IMPRESSION: Possible nondisplaced/incomplete intertrochanteric left hip fracture.  If the patient is non-weight-bearing, consider CT or MRI for confirmation.   Original Report Authenticated By: Charline Bills, M.D.    Ct Hip Left Wo Contrast  01/18/2012  *RADIOLOGY REPORT*  Clinical Data: Left hip pain status post fall today.  CT OF THE LEFT HIP WITHOUT CONTRAST  Technique:  Multidetector CT imaging was performed according to the standard protocol. Multiplanar CT image reconstructions were also generated.  Comparison: Radiographs same date.  Findings: As suspected on earlier radiographs, there is an intertrochanteric left femur fracture.  This appears mildly displaced.  The left femoral head is intact.  There is no evidence of dislocation or femoral head avascular necrosis.  The visualized inferior left hemi pelvis is intact.  There are mild degenerative changes at the symphysis pubis.  No significant proximal thigh or pelvic hematoma is identified. There are scattered vascular calcifications and a moderate amount of stool within the rectum.  IMPRESSION: Mildly displaced intertrochanteric left femur fracture.   Original Report Authenticated By: Carey Bullocks, M.D.     EKG: Independently reviewed. Left Bundle branch block.  Assessment/Plan Principal Problem:  *Closed left hip fracture Active Problems:   DIABETES MELLITUS, TYPE II  HYPERLIPIDEMIA  HYPERSOMNIA UNSPECIFIED  PACEMAKER, PERMANENT  1-Left hip fracture: Patient with multiple medical problems, Diabetes, CAD, AV block Status post pacemaker. She is at moderate risk for surgery.  She will benefit from surgery to recover mobility and help with pain. EKG with left Bundle Branch block, prior EKG with anterior fascicular block. She denies lightheaded, chest pain, or dyspnea. Patient and family agree to proceed with surgery and understand risk. Ortho consulted.  2-Diabetes: Hold metformin. Start SSI.  3-Hypertension: Continue with Coreg, aspirin,  4-DVT Prophylaxis: Will defer to orthostart start anticoagulation.    Code Status: need to be address after surgery.  Family Communication: Daughter at bedside, care discussed with her. Disposition Plan: expect 3 to 4 days.  Time spent: 70 minutes.  Eulises Kijowski [facility] Pager [phone]  If 7PM-7AM, please contact night-coverage [email or web address] Password [facility] 01/18/2012, 11:47 AM

## 2012-01-18 NOTE — Anesthesia Postprocedure Evaluation (Signed)
Anesthesia Post Note  Patient: Brittany [name]  Procedure(s) Performed: Procedure(s) (LRB): INTRAMEDULLARY (IM) NAIL FEMORAL (Left)  Anesthesia type: General  Patient location: PACU  Post pain: Pain level controlled and Adequate analgesia  Post assessment: Post-op Vital signs reviewed, Patient's Cardiovascular Status Stable, Respiratory Function Stable, Patent Airway and Pain level controlled  Last Vitals:  Filed Vitals:   01/18/12 1430  BP:   Pulse:   Temp:   Resp: 15    Post vital signs: Reviewed and stable  Level of consciousness: awake, alert  and oriented  Complications: No apparent anesthesia complications

## 2012-01-18 NOTE — Op Note (Signed)
01/18/2012  1:42 PM  PATIENT:   Brittany [name]  79 y.o. female  PRE-OPERATIVE DIAGNOSIS:  left  intertroch fracture  POST-OPERATIVE DIAGNOSIS:  same  PROCEDURE:  ORIF  SURGEON:  Anginette Espejo, Vania Rea. M.D.  ASSISTANTS: Shuford pac   ANESTHESIA:   GET  EBL: 100cc  SPECIMEN:  none  Drains: none   PATIENT DISPOSITION:  PACU - hemodynamically stable.    PLAN OF CARE: Admit to inpatient   Dictation# [id number], [id number]

## 2012-01-18 NOTE — ED Notes (Signed)
Patient to xray at this time

## 2012-01-18 NOTE — Anesthesia Preprocedure Evaluation (Addendum)
Anesthesia Evaluation  Patient identified by MRN, date of birth, ID band Patient awake    Reviewed: Allergy & Precautions, H&P , NPO status , Patient's Chart, lab work & pertinent test results, reviewed documented beta blocker date and time   History of Anesthesia Complications (+) Family history of anesthesia reaction  Airway Mallampati: II TM Distance: >3 FB Neck ROM: full    Dental   Pulmonary neg pulmonary ROS,  breath sounds clear to auscultation        Cardiovascular hypertension, On Medications and On Home Beta Blockers negative cardio ROS  + dysrhythmias + pacemaker Rhythm:regular     Neuro/Psych negative neurological ROS  negative psych ROS   GI/Hepatic negative GI ROS, Neg liver ROS,   Endo/Other  diabetes, Oral Hypoglycemic Agents  Renal/GU negative Renal ROS  negative genitourinary   Musculoskeletal   Abdominal   Peds  Hematology negative hematology ROS (+)   Anesthesia Other Findings See surgeon's H&P   Reproductive/Obstetrics negative OB ROS                           Anesthesia Physical Anesthesia Plan  ASA: III  Anesthesia Plan: General   Post-op Pain Management:    Induction: Intravenous  Airway Management Planned: Oral ETT  Additional Equipment:   Intra-op Plan:   Post-operative Plan: Extubation in OR  Informed Consent: I have reviewed the patients History and Physical, chart, labs and discussed the procedure including the risks, benefits and alternatives for the proposed anesthesia with the patient or authorized representative who has indicated his/her understanding and acceptance.   Dental Advisory Given  Plan Discussed with: CRNA and Surgeon  Anesthesia Plan Comments:         Anesthesia Quick Evaluation

## 2012-01-18 NOTE — ED Provider Notes (Addendum)
History     CSN: [id number]  Arrival date & time 01/18/12  4098   First MD Initiated Contact with Patient 01/18/12 [phone]      Chief Complaint  Patient presents with  . Fall  . Hip Pain    left    (Consider location/radiation/quality/duration/timing/severity/associated sxs/prior treatment) Patient is a 79 y.o. female presenting with fall and hip pain. The history is provided by the patient.  Fall The accident occurred less than 1 hour ago. Incident: got up from the couch and twisted loosing her balance and fell on her left hip. She fell from a height of 1 to 2 ft. She landed on carpet. There was no blood loss. The point of impact was the left hip. The pain is present in the left hip. The pain is at a severity of 10/10. The pain is severe. She was not ambulatory at the scene. Pertinent negatives include no numbness, no headaches, no loss of consciousness and no tingling. The symptoms are aggravated by use of the injured limb and pressure on the injury. Treatment on scene includes a c-collar and a backboard. Treatments tried: Patient given 6 mg of morphine in route. The treatment provided mild relief.  Hip Pain Pertinent negatives include no headaches.    Past Medical History  Diagnosis Date  . CAD (coronary artery disease)   . Pacemaker   . AV block   . Hyperlipidemia   . Hypertension   . Diabetes mellitus     Type II  . Glaucoma(365)     Past Surgical History  Procedure Date  . Tonsillectomy 1937  . Breast biopsy 1991  . Left total knee replacement 1996  . Pacemaker insertion     [employer]    Family History  Problem Relation Age of Onset  . Cancer    . Coronary artery disease    . Stroke    . Diabetes    . Hypertension      History  Substance Use Topics  . Smoking status: Former Smoker    Types: Cigarettes    Quit date: 01/17/1951  . Smokeless tobacco: Never Used  . Alcohol Use: Yes    OB History    Grav Para Term Preterm Abortions TAB SAB Ect Mult Living                    Review of Systems  Neurological: Negative for tingling, loss of consciousness, numbness and headaches.  All other systems reviewed and are negative.    Allergies  Ace inhibitors and Atorvastatin  Home Medications   Current Outpatient Rx  Name  Route  Sig  Dispense  Refill  . ASPIRIN 81 MG PO TABS   Oral   Take 81 mg by mouth daily.           . B-COMPLEX PO   Oral   Take 1 capsule by mouth daily.           Marland Kitchen CARVEDILOL 12.5 MG PO TABS   Oral   Take 1 tablet (12.5 mg total) by mouth 2 (two) times daily with a meal.   60 tablet   11   . DIALYVITE VITAMIN D 5000 PO   Oral   Take 1 tablet by mouth 3 (three) times daily.           Marland Kitchen CINNAMON 500 MG PO TABS   Oral   Take 1 tablet by mouth 2 (two) times daily.           Marland Kitchen  CO Q 10 10 MG PO CAPS   Oral   Take 1 capsule by mouth daily.           Marland Kitchen ECHINACEA 400 MG PO CAPS   Oral   Take by mouth.           Marland Kitchen GINGER OIL OIL   Does not apply   1 tablet by Does not apply route 2 (two) times daily.           Marland Kitchen GLUCOSAMINE-CHONDROITIN 500-400 MG PO TABS   Oral   Take 1 tablet by mouth 2 (two) times daily.           Marland Kitchen METFORMIN HCL ER (MOD) 500 MG PO TB24   Oral   Take 500 mg by mouth 2 (two) times daily with a meal.           . MEGA BASIC PO   Oral   Take 1 tablet by mouth daily.           Marland Kitchen VALSARTAN-HYDROCHLOROTHIAZIDE 160-12.5 MG PO TABS   Oral   Take 1 tablet by mouth daily.             BP 178/91  Pulse 59  Temp 98.4 F (36.9 C) (Oral)  Resp 22  SpO2 98%  Physical Exam  Nursing note and vitals reviewed. Constitutional: She is oriented to person, place, and time. She appears well-developed and well-nourished. She appears distressed.  HENT:  Head: Normocephalic and atraumatic.  Mouth/Throat: Oropharynx is clear and moist.  Eyes: Conjunctivae normal and EOM are normal. Pupils are equal, round, and reactive to light.  Neck: Normal range of motion. Neck supple.   Cardiovascular: Normal rate, regular rhythm and intact distal pulses.   No murmur heard. Pulmonary/Chest: Effort normal and breath sounds normal. No respiratory distress. She has no wheezes. She has no rales.  Abdominal: Soft. She exhibits no distension. There is no tenderness. There is no rebound and no guarding.  Musculoskeletal: She exhibits no edema and no tenderness.       Left hip: She exhibits decreased range of motion and tenderness. She exhibits no deformity.       Left knee: Normal.       Left ankle: Normal.       Bilateral knee replacements with well-healed scars. 2+ DP and PT pulses. Normal sensation  Neurological: She is alert and oriented to person, place, and time. She has normal strength. No sensory deficit.  Skin: Skin is warm and dry. No rash noted. No erythema.  Psychiatric: She has a normal mood and affect. Her behavior is normal.    ED Course  Procedures (including critical care time)  Labs Reviewed  CBC WITH DIFFERENTIAL - Abnormal; Notable for the following:    WBC 11.9 (*)     Neutro Abs 8.9 (*)     All other components within normal limits  BASIC METABOLIC PANEL - Abnormal; Notable for the following:    Glucose, Bld 238 (*)     GFR calc non Af Amer 83 (*)     All other components within normal limits   Dg Chest 1 View  01/18/2012  *RADIOLOGY REPORT*  Clinical Data: Fall, left hip pain  CHEST - 1 VIEW  Comparison: 01/21/2005  Findings: Chronic interstitial markings.  No focal consolidation.  Stable cardiomegaly.  Left subclavian pacemaker.  IMPRESSION: No evidence of acute cardiopulmonary disease.   Original Report Authenticated By: Charline Bills, M.D.    Dg Hip Complete Left  01/18/2012  *RADIOLOGY REPORT*  Clinical Data: Fall, left hip pain  LEFT HIP - COMPLETE 2+ VIEW  Comparison: None.  Findings: Possible nondisplaced/incomplete intertrochanteric left hip fracture, although only well visualized on a single view.  Bilateral hip joint spaces are symmetric.   Visualized bony pelvis appears intact.  IMPRESSION: Possible nondisplaced/incomplete intertrochanteric left hip fracture.  If the patient is non-weight-bearing, consider CT or MRI for confirmation.   Original Report Authenticated By: Charline Bills, M.D.    Ct Hip Left Wo Contrast  01/18/2012  *RADIOLOGY REPORT*  Clinical Data: Left hip pain status post fall today.  CT OF THE LEFT HIP WITHOUT CONTRAST  Technique:  Multidetector CT imaging was performed according to the standard protocol. Multiplanar CT image reconstructions were also generated.  Comparison: Radiographs same date.  Findings: As suspected on earlier radiographs, there is an intertrochanteric left femur fracture.  This appears mildly displaced.  The left femoral head is intact.  There is no evidence of dislocation or femoral head avascular necrosis.  The visualized inferior left hemi pelvis is intact.  There are mild degenerative changes at the symphysis pubis.  No significant proximal thigh or pelvic hematoma is identified. There are scattered vascular calcifications and a moderate amount of stool within the rectum.  IMPRESSION: Mildly displaced intertrochanteric left femur fracture.   Original Report Authenticated By: Carey Bullocks, M.D.      Date: 01/18/2012  Rate: 61  Rhythm: normal sinus rhythm  QRS Axis: normal  Intervals: normal  ST/T Wave abnormalities: nonspecific ST/T changes  Conduction Disutrbances:left bundle branch block  Narrative Interpretation:   Old EKG Reviewed: none available   1. Intertrochanteric fracture of left hip       MDM   Patient with a mechanical fall today in severe pain in her left hip. She is unable to move it due to severe pain. She is neurovascularly intact and full range of motion of the ankle and the knee. She did not hit her head have LOC or have neck pain. Feel most likely the patient has a left hip fracture. Medical clearance labs and hip films pending. Patient given pain  control.  10:18 AM Plain films were indeterminate and CT confirmed a left intertrochanteric fracture. Patient with multiple medical problems and will admit to medicine for clearance and ortho consult surgical repair      Gwyneth Sprout, MD 01/18/12 1019  Gwyneth Sprout, MD 01/18/12 1019

## 2012-01-18 NOTE — Progress Notes (Signed)
INITIAL NUTRITION ASSESSMENT  DOCUMENTATION CODES Per approved criteria  -Obesity Unspecified   INTERVENTION:  Advance diet as medically appropriate RD to follow for nutrition care plan, add interventions accordingly   NUTRITION DIAGNOSIS: Increased nutrient needs related to post-op healing as evidenced estimated nutrition needs  Goal: Oral intake with meals & supplements to meet >/= 90% of estimated nutrition needs  Monitor:  PO diet advancement & intake, weight, labs, I/O's  Reason for Assessment: Consult ---> Hip Fracture Protocol  79 y.o. female  Admitting Dx: Closed left hip fracture  ASSESSMENT: Patient presented to hospital complaining of left hip pain after a fall ---> closed hip fracture; RD unable to obtain nutrition hx at this time; patient in PACU for ORIF procedure; patient would benefit from addition of nutrition supplements when able to promote healing & recovery ---> RD to monitor.  Height: Ht Readings from Last 1 Encounters:  01/17/11 5\' 2"  (1.575 m)    Weight: Wt Readings from Last 1 Encounters:  01/17/11 200 lb (90.719 kg)    Ideal Body Weight: 110 lb  % Ideal Body Weight: 181%  Wt Readings from Last 10 Encounters:  01/17/11 200 lb (90.719 kg)  09/10/09 194 lb (87.998 kg)  09/02/08 190 lb (86.183 kg)  06/23/08 192 lb 4 oz (87.204 kg)  11/05/06 188 lb 12.8 oz (85.639 kg)    Usual Body Weight: 194 lb  % Usual Body Weight: 103%  BMI:  36.5 kg/m2  Estimated Nutritional Needs: Kcal: 1700-1900 Protein: 90-100 gm Fluid: 1.7-1.9 L  Skin: surgical hip incision   Diet Order: NPO  EDUCATION NEEDS: -No education needs identified at this time   Intake/Output Summary (Last 24 hours) at 01/18/12 1411 Last data filed at 01/18/12 1345  Gross per 24 hour  Intake    750 ml  Output      0 ml  Net    750 ml    Labs:   Lab 01/18/12 0826  NA 138  K 4.2  CL 100  CO2 27  BUN 18  CREATININE 0.60  CALCIUM 10.3  MG --  PHOS --  GLUCOSE  238*    CBG (last 3)   Basename 01/18/12 1219  GLUCAP 150*    Scheduled Meds:   . sodium chloride   Intravenous STAT  . aspirin EC  81 mg Oral QHS  . B-complex with vitamin C  1 tablet Oral Daily  . carvedilol  12.5 mg Oral BID WC  .  ceFAZolin (ANCEF) IV  2 g Intravenous On Call to OR  . chlorhexidine  60 mL Topical Once  . docusate sodium  100 mg Oral BID  . insulin aspart  0-9 Units Subcutaneous TID WC    Continuous Infusions:   . lactated ringers 50 mL/hr at 01/18/12 1225    Past Medical History  Diagnosis Date  . CAD (coronary artery disease)   . Pacemaker   . AV block   . Hyperlipidemia   . Hypertension   . Diabetes mellitus     Type II  . Glaucoma(365)     Past Surgical History  Procedure Date  . Tonsillectomy 1937  . Breast biopsy 1991  . Left total knee replacement 1996  . Pacemaker insertion     [employer]    Maureen Chatters, [place], [place] Pager #: [phone] After-Hours Pager #: [phone]

## 2012-01-18 NOTE — Progress Notes (Signed)
Pt unable to give a number for pain level

## 2012-01-19 ENCOUNTER — Encounter (HOSPITAL_COMMUNITY): Payer: Self-pay | Admitting: Orthopedic Surgery

## 2012-01-19 LAB — GLUCOSE, CAPILLARY: Glucose-Capillary: 174 mg/dL — ABNORMAL HIGH (ref 70–99)

## 2012-01-19 LAB — BASIC METABOLIC PANEL
BUN: 17 mg/dL (ref 6–23)
Chloride: 102 mEq/L (ref 96–112)
Creatinine, Ser: 0.7 mg/dL (ref 0.50–1.10)
GFR calc Af Amer: >90 mL/min (ref >90)
GFR calc non Af Amer: 79 mL/min — ABNORMAL LOW (ref >90)
Glucose, Bld: 182 mg/dL — ABNORMAL HIGH (ref 70–99)

## 2012-01-19 LAB — CBC
HCT: 31.9 % — ABNORMAL LOW (ref 36.0–46.0)
Hemoglobin: 10.6 g/dL — ABNORMAL LOW (ref 12.0–15.0)
MCHC: 33.2 g/dL (ref 30.0–36.0)
MCV: 87.2 fL (ref 78.0–100.0)
RDW: 14.1 % (ref 11.5–15.5)

## 2012-01-19 MED ORDER — ASPIRIN 81 MG PO CHEW
CHEWABLE_TABLET | ORAL | Status: AC
  Filled 2012-01-19: qty 1

## 2012-01-19 NOTE — Clinical Social Work Psychosocial (Signed)
Clinical Social Work Department BRIEF PSYCHOSOCIAL ASSESSMENT 01/19/2012  Patient:  Brittany [name], Brittany [name]     Account Number:  [id number]     Admit date:  01/18/2012  Clinical Social Worker:  Thomasene Mohair  Date/Time:  01/19/2012 11:00 AM  Referred by:  Physician  Date Referred:  01/19/2012 Referred for  SNF Placement   Other Referral:   Interview type:  Patient Other interview type:    PSYCHOSOCIAL DATA Living Status:  FAMILY Admitted from facility:   Level of care:   Primary support name:  [name] Primary support relationship to patient:  CHILD, ADULT Degree of support available:   adequate    CURRENT CONCERNS Current Concerns  Post-Acute Placement  Adjustment to Illness   Other Concerns:    SOCIAL WORK ASSESSMENT / PLAN CSW was referred to Pt to assist with dc planning. Pt lives with daughter who works 12hour days but is supportive of Pt. Pt has 5 other adult children who live out of state. 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 Pt was independent of ADLs prior to fall.  Pt has been to CIR many years ago, but needs SNF at dc. CSW will begin snf bed search for possible dc Sunday or Monday.   Assessment/plan status:  Psychosocial Support/Ongoing Assessment of Needs Other assessment/ plan:   Information/referral to community resources:    PATIENT'S/FAMILY'S RESPONSE TO PLAN OF CARE: Pt is agreeable to SNF at dc for rehab.  Pt's daughter at work today, Pt states that she will talk to her daughter this weekend and they will decide on facility.   Frederico Hamman, LCSW [phone]

## 2012-01-19 NOTE — Progress Notes (Signed)
Patient with body aches , temp 100.2 Gave tylenol for aches . called to lab for UTM. sent . Patient stated felt fine had flu shot 2 weeks ago. Did not do test

## 2012-01-19 NOTE — Op Note (Signed)
NAMEMALAY, FANTROY NO.:  [id number]  MEDICAL RECORD NO.:  [id number]  LOCATION:  2002                         FACILITY:  [facility]  PHYSICIAN:  Vania Rea. Aasir Daigler, M.D.  DATE OF BIRTH:  Sep 15, 1927  DATE OF PROCEDURE:  01/18/2012 DATE OF DISCHARGE:                              OPERATIVE REPORT   ADDENDUM:  Ralene Bathe, PA-C was used as an Geophysicist/field seismologist throughout this case, help with patient positioning, prepping and draping, maintenance of reduction, retraction, instrument management, wound closer, and intraoperative decision making.     Vania Rea. Tomasa Dobransky, M.D.     KMS/MEDQ  D:  01/18/2012  T:  01/19/2012  Job:  [id number]

## 2012-01-19 NOTE — Evaluation (Signed)
Chelcea Zahn [place],PT Acute Rehabilitation [phone] (pager)  

## 2012-01-19 NOTE — Progress Notes (Signed)
Brittany [name]  MRN: [id number] DOB/Age: July 22, 1927 79 y.o. Physician: Jacquelyne Balint Procedure: Procedure(s) (LRB): INTRAMEDULLARY (IM) NAIL FEMORAL (Left)     Subjective: awake and alert. Pain as expected, beginning intial PT eval  Vital Signs Temp:  [97.7 F (36.5 C)-99.5 F (37.5 C)] 99.5 F (37.5 C) (01/10 0434) Pulse Rate:  [55-95] 95  (01/10 0810) Resp:  [11-18] 16  (01/10 0434) BP: (118-158)/(54-99) 143/54 mmHg (01/10 0810) SpO2:  [92 %-100 %] 92 % (01/10 0434)  Lab Results  Basename 01/19/12 0535 01/18/12 1544  WBC 9.8 18.4*  HGB 10.6* 12.0  HCT 31.9* 36.4  PLT 228 234   BMET  Basename 01/19/12 0535 01/18/12 1544 01/18/12 0826  NA 137 -- 138  K 4.2 -- 4.2  CL 102 -- 100  CO2 27 -- 27  GLUCOSE 182* -- 238*  BUN 17 -- 18  CREATININE 0.70 0.58 --  CALCIUM 9.8 -- 10.3   No results found for this basename: inr     Exam Left hip dressing with scant drainage. NVI moving foot well        Plan S/P ORIF L HIP -cont attempts to mobilize, WBAT -HGB stable -medical management per hospitalists -will follow  [facility] for Dr.Kevin Supple 01/19/2012, 8:46 AM

## 2012-01-19 NOTE — Evaluation (Signed)
Occupational Therapy Evaluation Patient Details Name: Brittany [name] MRN: [id number] DOB: [birth year] Today's Date: 01/19/2012 Time: 4098-1191 OT Time Calculation (min): 42 min  OT Assessment / Plan / Recommendation Clinical Impression  Pt admitted with left hip fx s/p fall at home. Now s/p IM nail.  Pt significantly limited by pain during eval session.  Pt lives with daughter who is at work majority of the day.  Pt will need to reach mod I in order to safely return home, therefore recommending ST SNF at this time.  Pt will benefit from acute OT services to address below problem list.    OT Assessment  Patient needs continued OT Services    Follow Up Recommendations  SNF    Barriers to Discharge Decreased caregiver support Pt does not have assist during day.    Equipment Recommendations  3 in 1 bedside comode    Recommendations for Other Services    Frequency  Min 2X/week    Precautions / Restrictions Precautions Precautions: Fall Restrictions Weight Bearing Restrictions: Yes LLE Weight Bearing: Weight bearing as tolerated   Pertinent Vitals/Pain 10/10 Left hip pain during movement    ADL  Grooming: Performed;Wash/dry face;Set up Where Assessed - Grooming: Supported sitting Upper Body Dressing: Performed;Minimal assistance Where Assessed - Upper Body Dressing: Supported sitting Lower Body Dressing: Performed;+1 Total assistance Where Assessed - Lower Body Dressing: Supine, head of bed up Equipment Used:  (none) ADL Comments: Pt limited by pain.    OT Diagnosis: Generalized weakness;Acute pain  OT Problem List: Decreased activity tolerance;Decreased strength;Impaired balance (sitting and/or standing);Decreased knowledge of use of DME or AE;Pain;Obesity OT Treatment Interventions: Self-care/ADL training;DME and/or AE instruction;Therapeutic activities;Balance training;Patient/family education   OT Goals Acute Rehab OT Goals OT Goal Formulation: With patient Time For  Goal Achievement: 02/02/12 Potential to Achieve Goals: Fair ADL Goals Pt Will Perform Grooming: with supervision;Standing at sink ADL Goal: Grooming - Progress: Goal set today Pt Will Perform Upper Body Dressing: Sitting, chair;Sitting, bed;Unsupported;with supervision ADL Goal: Upper Body Dressing - Progress: Goal set today Pt Will Perform Lower Body Dressing: with supervision;Sit to stand from chair;Sit to stand from bed;with adaptive equipment ADL Goal: Lower Body Dressing - Progress: Goal set today Pt Will Transfer to Toilet: with supervision;Ambulation;with DME;Comfort height toilet ADL Goal: Toilet Transfer - Progress: Goal set today Pt Will Perform Toileting - Clothing Manipulation: with supervision;Sitting on 3-in-1 or toilet;Standing ADL Goal: Toileting - Clothing Manipulation - Progress: Goal set today Pt Will Perform Toileting - Hygiene: with supervision;Sit to stand from 3-in-1/toilet;Standing at 3-in-1/toilet ADL Goal: Toileting - Hygiene - Progress: Goal set today Miscellaneous OT Goals Miscellaneous OT Goal #1: Pt will perform bed mobility at supervision level as precursor for EOB ADLs. OT Goal: Miscellaneous Goal #1 - Progress: Goal set today  Visit Information  Last OT Received On: 01/19/12 Assistance Needed: +2 PT/OT Co-Evaluation/Treatment: Yes    Subjective Data  Subjective: "I know I have to do this. "   Prior Functioning     Home Living Lives With: Daughter Available Help at Discharge: Available PRN/intermittently;Family (Dtr works but can be there at night; pt requests life alert) Type of Home: House Home Access: Stairs to enter [employer] of Steps: 3 Entrance Stairs-Rails: Left Home Layout: Two level;Able to live on main level with bedroom/bathroom;Full bath on main level Bathroom Shower/Tub: Tub/shower unit;Door [employer] Toilet: Handicapped height Bathroom Accessibility: Yes How Accessible: Accessible via walker Home Adaptive Equipment:  Dan Humphreys - four wheeled;Walker - rolling;Other (comment) (bathroom upstairs handicap ready, downstairs  is not) Additional Comments: [place] and is sometimes gone at least 12 hrs during day.  Prior Function Level of Independence: Independent with assistive device(s) Able to Take Stairs?: Yes Driving: No Comments: Furniture walker at home, fell trying to stand up after holding onto couch; pt with prior history of bilat knee replacements Communication Communication: No difficulties Dominant Hand: Right         Vision/Perception     Cognition  Overall Cognitive Status: Impaired Area of Impairment: Attention;Following commands Arousal/Alertness: Awake/alert Orientation Level: Oriented X4 / Intact Behavior During Session: Other (comment) (pain focused) Current Attention Level: Focused Attention - Other Comments: pt focused on pain, self limiting mobility Following Commands: Follows one step commands inconsistently (due to pain)    Extremity/Trunk Assessment Right Upper Extremity Assessment RUE ROM/Strength/Tone: [facility] for tasks assessed Left Upper Extremity Assessment LUE ROM/Strength/Tone: WFL for tasks assessed Right Lower Extremity Assessment RLE ROM/Strength/Tone: Deficits RLE ROM/Strength/Tone Deficits: 2+/5, difficulty with all ROM, grimmacing due to pain in LLE RLE Sensation: WFL - Light Touch Left Lower Extremity Assessment LLE ROM/Strength/Tone: Deficits LLE ROM/Strength/Tone Deficits: unable to test strength at this time, pt with pain all motions,  LLE Sensation: WFL - Light Touch Trunk Assessment Trunk Assessment: Normal     Mobility Bed Mobility Bed Mobility: Rolling Right;Rolling Left;Right Sidelying to Sit;Sitting - Scoot to [employer] of Bed;Sit to Supine;Scooting to [facility] Rolling Right: 1: +2 Total assist Rolling Right: Patient Percentage: 40% Right Sidelying to Sit: 1: +2 Total assist;HOB elevated;With rails Right Sidelying to Sit:  Patient Percentage: 10% Sitting - Scoot to Edge of Bed: 1: +2 Total assist;With rail Sitting - Scoot to [place] of Bed: Patient Percentage: 10% Sit to Supine: 1: +2 Total assist;HOB flat Sit to Supine: Patient Percentage: 0% Scooting to HOB: 1: +2 Total assist Scooting to [facility]: Patient Percentage: 0% Details for Bed Mobility Assistance: Pt significantly self-limiting due to pain, had to move very slowly, difficulty with any mobility of LE, heavily leaned to right and only sat up for 5-10seconds at a time.  Continuously asking to let her move herself, however attempting, she was unable to do so. Significantly decr safety awareness secondary to focus on pain Transfers Transfers: Not assessed     Shoulder Instructions     Exercise    Balance Balance Balance Assessed: Yes Static Sitting Balance Static Sitting - Balance Support: Right upper extremity supported;Feet supported (RLE supported on floor) Static Sitting - Level of Assistance: 2: Max assist Static Sitting - Comment/# of Minutes: Pt leaning heavily to right side (unable to tolerate weight through left hip due to pain).  Assist to position RLE on floor for support.    End of Session OT - End of Session Equipment Utilized During Treatment:  (none) Activity Tolerance: Patient limited by pain Patient left: in bed;with call bell/phone within reach;with bed alarm set Nurse Communication: Mobility status  GO    01/19/2012 Cipriano Mile OTR/L Pager [phone] Office [phone]  Cipriano Mile 01/19/2012, 10:09 AM

## 2012-01-19 NOTE — Progress Notes (Signed)
[facility] PROGRESS NOTE  [name]:[id number] DOB: 1927-12-04 DOA: 01/18/2012 PCP: Thomos Lemons, DO  Assessment/Plan: 1-Left hip fracture: s/p ORIF on 1/9. Pain control an dPT. Plan for SNF on discharge. Marland Kitchen  2-Diabetes: Hold metformin. Start SSI.  CBG (last 3)   Basename 01/19/12 1620 01/19/12 1124 01/19/12 0744  GLUCAP 159* 183* 174*     3-Hypertension: Continue with Coreg, aspirin,   4-DVT Prophylaxis: on lovenox.   Code Status: need to be addressed, with family. As pt is in pain now.  Family Communication: Daughter at bedside, care discussed with her.  Disposition Plan: Monday to SNF.     Consultants:  orthopedics.  Procedures: ORIF ON 01/18/12  HPI/Subjective: PAINFUL left leg at the time of PT.  Objective: Filed Vitals:   01/19/12 0434 01/19/12 0810 01/19/12 1359 01/19/12 1624  BP: 158/69 143/54 119/38 120/60  Pulse: 85 95 78   Temp: 99.5 F (37.5 C)  100.2 F (37.9 C)   TempSrc: Oral  Oral   Resp: 16  18   SpO2: 92%  92%     Intake/Output Summary (Last 24 hours) at 01/19/12 1644 Last data filed at 01/19/12 1300  Gross per 24 hour  Intake    480 ml  Output    300 ml  Net    180 ml   There were no vitals filed for this visit.  Exam:   General:  Alert afebrile comfortable  Cardiovascular: s1s2 RRR,  Respiratory: CTAB  Abdomen: soft NT NDBS+  Extremities: trace pedal edema. Left hip tenderness from the ORIF.    Data Reviewed: Basic Metabolic Panel:  Lab 01/19/12 8119 01/18/12 1544 01/18/12 0826  NA 137 -- 138  K 4.2 -- 4.2  CL 102 -- 100  CO2 27 -- 27  GLUCOSE 182* -- 238*  BUN 17 -- 18  CREATININE 0.70 0.58 0.60  CALCIUM 9.8 -- 10.3  MG -- -- --  PHOS -- -- --   Liver Function Tests: No results found for this basename: AST:5,ALT:5,ALKPHOS:5,BILITOT:5,PROT:5,ALBUMIN:5 in the last 168 hours No results found for this basename: LIPASE:5,AMYLASE:5 in the last 168 hours No results found for this basename: AMMONIA:5 in  the last 168 hours CBC:  Lab 01/19/12 0535 01/18/12 1544 01/18/12 0826  WBC 9.8 18.4* 11.9*  NEUTROABS -- -- 8.9*  HGB 10.6* 12.0 13.4  HCT 31.9* 36.4 39.9  MCV 87.2 87.3 86.9  PLT 228 234 276   Cardiac Enzymes: No results found for this basename: CKTOTAL:5,CKMB:5,CKMBINDEX:5,TROPONINI:5 in the last 168 hours BNP (last 3 results) No results found for this basename: PROBNP:3 in the last 8760 hours CBG:  Lab 01/19/12 1620 01/19/12 1124 01/19/12 0744 01/19/12 0601 01/18/12 2102  GLUCAP 159* 183* 174* 166* 157*    No results found for this or any previous visit (from the past 240 hour(s)).   Studies: Dg Chest 1 View  01/18/2012  *RADIOLOGY REPORT*  Clinical Data: Fall, left hip pain  CHEST - 1 VIEW  Comparison: 01/21/2005  Findings: Chronic interstitial markings.  No focal consolidation.  Stable cardiomegaly.  Left subclavian pacemaker.  IMPRESSION: No evidence of acute cardiopulmonary disease.   Original Report Authenticated By: Charline Bills, M.D.    Dg Hip Complete Left  01/18/2012  *RADIOLOGY REPORT*  Clinical Data: Fall, left hip pain  LEFT HIP - COMPLETE 2+ VIEW  Comparison: None.  Findings: Possible nondisplaced/incomplete intertrochanteric left hip fracture, although only well visualized on a single view.  Bilateral hip joint spaces are symmetric.  Visualized  bony pelvis appears intact.  IMPRESSION: Possible nondisplaced/incomplete intertrochanteric left hip fracture.  If the patient is non-weight-bearing, consider CT or MRI for confirmation.   Original Report Authenticated By: Charline Bills, M.D.    Dg Hip Operative Left  01/18/2012  *RADIOLOGY REPORT*  Clinical Data: Left intertrochanteric femur fracture.  OPERATIVE LEFT HIP  Comparison: 01/18/2012.  Findings: AP and lateral fluoroscopic spot films submitted for interpretation show gamma nail fixation of the left hip.  Short intermedullary nail is present with distal interlocking screw.  IMPRESSION: Gamma nail fixation of the  left intertrochanteric femur fracture.   Original Report Authenticated By: Andreas Newport, M.D.    Ct Hip Left Wo Contrast  01/18/2012  *RADIOLOGY REPORT*  Clinical Data: Left hip pain status post fall today.  CT OF THE LEFT HIP WITHOUT CONTRAST  Technique:  Multidetector CT imaging was performed according to the standard protocol. Multiplanar CT image reconstructions were also generated.  Comparison: Radiographs same date.  Findings: As suspected on earlier radiographs, there is an intertrochanteric left femur fracture.  This appears mildly displaced.  The left femoral head is intact.  There is no evidence of dislocation or femoral head avascular necrosis.  The visualized inferior left hemi pelvis is intact.  There are mild degenerative changes at the symphysis pubis.  No significant proximal thigh or pelvic hematoma is identified. There are scattered vascular calcifications and a moderate amount of stool within the rectum.  IMPRESSION: Mildly displaced intertrochanteric left femur fracture.   Original Report Authenticated By: Carey Bullocks, M.D.     Scheduled Meds:   . aspirin EC  81 mg Oral QHS  . B-complex with vitamin C  1 tablet Oral Daily  . carvedilol  12.5 mg Oral BID WC  . docusate sodium  100 mg Oral BID  . enoxaparin (LOVENOX) injection  30 mg Subcutaneous Q24H  . insulin aspart  0-9 Units Subcutaneous TID WC   Continuous Infusions:   . lactated ringers 50 mL/hr at 01/18/12 1225    Principal Problem:  *Closed left hip fracture Active Problems:  DIABETES MELLITUS, TYPE II  HYPERLIPIDEMIA  HYPERSOMNIA UNSPECIFIED  PACEMAKER, PERMANENT        Brittany [name]  [facility] Pager [phone]. If 8PM-8AM, please contact night-coverage at [email or web address], password [facility] 01/19/2012, 4:44 PM  LOS: 1 day

## 2012-01-19 NOTE — Evaluation (Signed)
Physical Therapy Evaluation Patient Details Name: Brittany [name] MRN: [id number] DOB: December 01, 1927 Today's Date: 01/19/2012 Time: 4098-1191 PT Time Calculation (min): 42 min  PT Assessment / Plan / Recommendation Clinical Impression  Pt s/p Left femoral IM nailing on 01/18/12, h/o bilat knee replacements (several years ago per pt), pacemaker, CAD, and DM.   Presents today with LE weakness, limitations in balance, mobility, and activity tolerance. Will benefit from skilled PT in the acute setting to maximize independence and mobility.  Discussed d/c plans, pt states she would like to receive additional therapy prior to returning home, like she did after knee surgery.  Recommend SNF at this time.    PT Assessment  Patient needs continued PT services    Follow Up Recommendations  SNF       Barriers to Discharge Decreased caregiver support      Equipment Recommendations  None recommended by PT       Frequency Min 4X/week    Precautions / Restrictions Precautions Precautions: Fall Restrictions Weight Bearing Restrictions: Yes LLE Weight Bearing: Weight bearing as tolerated   Pertinent Vitals/Pain HR 89-115 O2 sats: 93-97% on RA Pain in L hip and low back, RN aware      Mobility  Bed Mobility Bed Mobility: Rolling Right;Rolling Left;Right Sidelying to Sit;Sitting - Scoot to [employer] of Bed;Sit to Supine;Scooting to [facility] Rolling Right: 1: +2 Total assist Rolling Right: Patient Percentage: 40% Right Sidelying to Sit: 1: +2 Total assist;HOB elevated;With rails Right Sidelying to Sit: Patient Percentage: 10% Sitting - Scoot to Edge of Bed: 1: +2 Total assist;With rail Sitting - Scoot to [place] of Bed: Patient Percentage: 10% Sit to Supine: 1: +2 Total assist;HOB flat Sit to Supine: Patient Percentage: 0% Scooting to HOB: 1: +2 Total assist Scooting to [facility]: Patient Percentage: 0% Details for Bed Mobility Assistance: Pt significantly self-limiting due to pain, had to move very slowly,  difficulty with any mobility of LE, heavily leaned to right and only sat up for 5-10seconds at a time.  Continuously asking to let her move herself, however attempting, she was unable to do so. Significantly decr safety awareness secondary to focus on pain Transfers Transfers: Not assessed Ambulation/Gait Ambulation/Gait Assistance: Not tested (comment) Stairs: No Wheelchair Mobility Wheelchair Mobility: No       Exercises General Exercises - Lower Extremity Ankle Circles/Pumps: AROM;Both;10 reps;Supine   PT Diagnosis: Difficulty walking;Acute pain  PT Problem List: Decreased strength;Decreased range of motion;Decreased activity tolerance;Decreased balance;Decreased mobility;Decreased coordination;Decreased cognition;Pain PT Treatment Interventions: DME instruction;Gait training;Stair training;Functional mobility training;Therapeutic activities;Therapeutic exercise;Balance training;Neuromuscular re-education   PT Goals Acute Rehab PT Goals PT Goal Formulation: With patient Time For Goal Achievement: 02/02/12 Potential to Achieve Goals: Good Pt will Roll Supine to Right Side: with min assist PT Goal: Rolling Supine to Right Side - Progress: Goal set today Pt will Roll Supine to Left Side: with min assist PT Goal: Rolling Supine to Left Side - Progress: Goal set today Pt will go Supine/Side to Sit: with mod assist PT Goal: Supine/Side to Sit - Progress: Goal set today Pt will Sit at Edge of Bed: with mod assist PT Goal: Sit at Edge Of Bed - Progress: Goal set today Pt will go Sit to Supine/Side: with min assist PT Goal: Sit to Supine/Side - Progress: Goal set today Pt will go Sit to Stand: with max assist PT Goal: Sit to Stand - Progress: Goal set today Pt will go Stand to Sit: with max assist PT Goal: Stand to Sit - Progress:  Goal set today Pt will Transfer Bed to Chair/Chair to Bed: with max assist PT Transfer Goal: Bed to Chair/Chair to Bed - Progress: Goal set today Pt will  Stand: with max assist PT Goal: Stand - Progress: Goal set today Pt will Ambulate: 1 - 15 feet;with max assist;with least restrictive assistive device PT Goal: Ambulate - Progress: Goal set today  Visit Information  Last PT Received On: 01/19/12 Assistance Needed: +2    Subjective Data  Subjective: I was able to move a little bit in bed, I've been trying. Patient Stated Goal: To go to SNF for therapy prior to returning home   Prior Functioning  Home Living Lives With: Daughter Available Help at Discharge: Available PRN/intermittently;Family (Dtr works but can be there at night; pt requests life alert) Type of Home: House Home Access: Stairs to enter [employer] of Steps: 3 Entrance Stairs-Rails: Left Home Layout: Two level;Able to live on main level with bedroom/bathroom;Full bath on main level Bathroom Shower/Tub: Tub/shower unit;Door [employer] Toilet: Handicapped height Bathroom Accessibility: Yes How Accessible: Accessible via walker Home Adaptive Equipment: Dan Humphreys - four wheeled;Walker - rolling;Other (comment) (bathroom upstairs handicap ready, downstairs is not) Additional Comments: [place] and is sometimes gone at least 12 hrs during day.  Prior Function Level of Independence: Independent with assistive device(s) Able to Take Stairs?: Yes Driving: No Comments: Furniture walker at home, fell trying to stand up after holding onto couch; pt with prior history of bilat knee replacements Communication Communication: No difficulties Dominant Hand: Right    Cognition  Overall Cognitive Status: Impaired Area of Impairment: Attention;Following commands Arousal/Alertness: Awake/alert Orientation Level: Oriented X4 / Intact Behavior During Session: Other (comment) (pain focused) Current Attention Level: Focused Attention - Other Comments: pt focused on pain, self limiting mobility Following Commands: Follows one step commands  inconsistently (due to pain)    Extremity/Trunk Assessment Right Upper Extremity Assessment RUE ROM/Strength/Tone: [facility] for tasks assessed Left Upper Extremity Assessment LUE ROM/Strength/Tone: WFL for tasks assessed Right Lower Extremity Assessment RLE ROM/Strength/Tone: Deficits RLE ROM/Strength/Tone Deficits: 2+/5, difficulty with all ROM, grimmacing due to pain in LLE RLE Sensation: WFL - Light Touch Left Lower Extremity Assessment LLE ROM/Strength/Tone: Deficits LLE ROM/Strength/Tone Deficits: unable to test strength at this time, pt with pain all motions,  LLE Sensation: WFL - Light Touch Trunk Assessment Trunk Assessment: Normal   Balance Balance Balance Assessed: Yes Static Sitting Balance Static Sitting - Balance Support: Right upper extremity supported;Feet supported (RLE supported on floor) Static Sitting - Level of Assistance: 2: Max assist Static Sitting - Comment/# of Minutes: Pt leaning heavily to right side (unable to tolerate weight through left hip due to pain).  Assist to position RLE on floor for support.   End of Session PT - End of Session Activity Tolerance: Patient limited by pain;Patient limited by fatigue Patient left: in bed;with call bell/phone within reach Nurse Communication: Mobility status;Weight bearing status;Other (comment) (pain with mobility)       Sharion Balloon 01/19/2012, 10:07 AM Sharion Balloon, SPT Acute Rehab Services [phone]

## 2012-01-19 NOTE — Op Note (Signed)
NAMEORPHA, DAIN NO.:  [id number]  MEDICAL RECORD NO.:  [id number]  LOCATION:  2002                         FACILITY:  [facility]  PHYSICIAN:  Vania Rea. Zaahir Pickney, M.D.  DATE OF BIRTH:  [birth year]  DATE OF PROCEDURE:  01/18/2012 DATE OF DISCHARGE:                              OPERATIVE REPORT   PREOPERATIVE DIAGNOSIS:  Left intertrochanteric hip fracture.  POSTOPERATIVE DIAGNOSIS:  Left intertrochanteric hip fracture.  PROCEDURE:  Open reduction and internal fixation utilizing intramedullary hip screw of the left intertrochanteric hip fracture.  SURGEON:  Vania Rea. Alysson Geist, M.D.  Threasa HeadsFrench Ana A. Shuford, PA-C  ANESTHESIA:  General endotracheal.  ESTIMATED BLOOD LOSS:  200 mL.  DRAINS:  None.  HISTORY:  Ms. Fittro is an 79 year old female with past medical history significant for type 2 diabetes, hyperlipidemia, and a cardiac arrhythmia requiring a pacemaker.  She lives at home with her family, fell this morning mechanical fall with immediate complaints of left hip pain and inability to bear weight.  On presentation to the hospital, she was found to have marked tenderness and severe pain with any attempted left hip motion and plain radiographs confirm this minimally displaced left intertrochanteric hip fracture.  She is brought to the operating room at this time for planned open reduction and internal fixation.  I preoperatively counseled Ms. Facemire as well as the family members regarding treatment options and risks versus benefits thereof.  Possible surgical complications were reviewed including the potential for bleeding, infection, neurovascular injury, DVT, malunion, nonunion, loss of fixation, and the possible need for additional surgery.  She understands and accepts and agrees with our planned procedure.  PROCEDURE IN DETAIL:  After undergoing routine preop evaluation, the patient received prophylactic antibiotics, brought to the operating  room and placed supine on the operative table, underwent smooth induction of general endotracheal anesthesia.  Transferred the fracture table in supine position.  Left leg placed in the longitudinal traction.  Right leg has some well leg holder and appropriately padded and protected. Fluoroscopic imaging was then obtained.  The knees confirmed proper reduction and alignment at the fracture site.  The left hip girdle region was then sterilely prepped and draped in standard fashion.  Time- out was called.  A 3 cm incision was then made just proximal to the trochanter and dissection carried down through skin and subcu tissue, deep fascia was divided longitudinally allowing access to the tip of the greater trochanter.  The starting awl was then placed with the tip of the trochanter and introduced into the intertrochanteric region with proper positioning confirmed on AP and lateral fluoroscopic images.  We then passed a ball-tip guidewire down the femoral canal, and then we used the standard starting reamer to the appropriate depth and then over the guidewire, we placed our standard 11 mm nail, 130 degrees angle. Using the outrigger guide, we then directed a guidepin up into the femoral neck and head.  Proper position confirmed fluoroscopically. This was then measured, drilled, and a 100 mm lag screw was then placed with good bony purchase and fixation and overall position much to our satisfaction.  We then placed this in the dynamic locking mode.  We used the outrigger guide and placed a single static distal locking screw. Proper positioning confirmed fluoroscopically.  Final images confirmed good position in the fracture site, good alignment, and good position of the hardware.  The wounds were irrigated, closed with 2-0 Vicryl for the subcu layer and intracuticular 3-0 Monocryl for the skin, followed by Steri-Strips.  A dry dressings were applied.  The patient was then removed from the  fracture table, transferred to her hospital bed, extubated and taken to recovery room in stable condition.     Vania Rea. Daison Braxton, M.D.     KMS/MEDQ  D:  01/18/2012  T:  01/19/2012  Job:  [id number]

## 2012-01-19 NOTE — Progress Notes (Signed)
Orthopedic Tech Progress Note Patient Details:  Brittany [name] 05-23-1927 [id number]  Patient ID: [name], female   DOB: [birth year], 79 y.o.   MRN: [id number] Trapeze bar patient helper  Nikki Dom 01/19/2012, 2:15 PM

## 2012-01-20 LAB — BASIC METABOLIC PANEL
BUN: 21 mg/dL (ref 6–23)
CO2: 27 mEq/L (ref 19–32)
Chloride: 100 mEq/L (ref 96–112)
Creatinine, Ser: 0.72 mg/dL (ref 0.50–1.10)
Glucose, Bld: 168 mg/dL — ABNORMAL HIGH (ref 70–99)
Potassium: 4.2 mEq/L (ref 3.5–5.1)

## 2012-01-20 LAB — CBC
HCT: 29.1 % — ABNORMAL LOW (ref 36.0–46.0)
Hemoglobin: 9.8 g/dL — ABNORMAL LOW (ref 12.0–15.0)
MCV: 88.7 fL (ref 78.0–100.0)
RBC: 3.28 MIL/uL — ABNORMAL LOW (ref 3.87–5.11)
RDW: 14 % (ref 11.5–15.5)
WBC: 10 10*3/uL (ref 4.0–10.5)

## 2012-01-20 LAB — GLUCOSE, CAPILLARY: Glucose-Capillary: 152 mg/dL — ABNORMAL HIGH (ref 70–99)

## 2012-01-20 MED ORDER — ENOXAPARIN SODIUM 40 MG/0.4ML ~~LOC~~ SOLN
40.0000 mg | SUBCUTANEOUS | Status: DC
  Administered 2012-01-21 – 2012-01-22 (×2): 40 mg via SUBCUTANEOUS
  Filled 2012-01-20 (×3): qty 0.4

## 2012-01-20 NOTE — Progress Notes (Signed)
Physical Therapy Treatment Patient Details Name: Brittany [name] MRN: [id number] DOB: 1927/10/20 Today's Date: 01/20/2012 Time: 1135-1200 PT Time Calculation (min): 25 min  PT Assessment / Plan / Recommendation Comments on Treatment Session  Pt s/p L IM nail with decr mobility secondary to continued pain and poor endurance.  Pt tolerated treatment better today and was able to get into chair.  Pt motivated to work with PT.      Follow Up Recommendations  SNF                 Equipment Recommendations  None recommended by PT        Frequency Min 4X/week   Plan Discharge plan remains appropriate;Frequency remains appropriate    Precautions / Restrictions Precautions Precautions: Fall Restrictions Weight Bearing Restrictions: Yes LLE Weight Bearing: Weight bearing as tolerated   Pertinent Vitals/Pain VSS, Some pain    Mobility  Bed Mobility Bed Mobility: Rolling Right;Right Sidelying to Sit;Sitting - Scoot to [employer] of Bed Rolling Right: 1: +2 Total assist Rolling Right: Patient Percentage: 40% Right Sidelying to Sit: 1: +2 Total assist Right Sidelying to Sit: Patient Percentage: 30% Sitting - Scoot to Edge of Bed: 1: +2 Total assist Sitting - Scoot to Edge of Bed: Patient Percentage: 30% Sit to Supine: Not Tested (comment) Scooting to [facility]: Not tested (comment) Details for Bed Mobility Assistance: Pt needed incr assist to get to EOB due to pain.  Used pad to asssit to EOB.   Transfers Transfers: Sit to Stand;Stand to Sit;Stand Pivot Transfers Sit to Stand: 1: +2 Total assist;With upper extremity assist;From bed Sit to Stand: Patient Percentage: 40% Stand to Sit: 1: +2 Total assist;With upper extremity assist;With armrests;To chair/3-in-1 Stand to Sit: Patient Percentage: 40% Stand Pivot Transfers: 1: +2 Total assist Stand Pivot Transfers: Patient Percentage: 50% Details for Transfer Assistance: Pt took incr time to achieve full standing position.  Initially, patient had  weight on left LE and was somewhat yelling in pain but encouraged patietn to put weight through her UEs on RW and paitent began to stand taller and had less pain.  Pt assisted initially with the pivot transfer and began to pvot using UEs on RW.  Pt got 1/2 way around and began to flex at her trunk and PT and tech had to bring recliner up to her so she could sit.  Uncontrolled descent into chair.  Pt unsafe with transtional movements overall.  Cleaned pt while she stood as she was soaked with urine.   Ambulation/Gait Ambulation/Gait Assistance: Not tested (comment) Stairs: No Wheelchair Mobility Wheelchair Mobility: No    PT Goals Acute Rehab PT Goals PT Goal: Rolling Supine to Right Side - Progress: Progressing toward goal PT Goal: Supine/Side to Sit - Progress: Progressing toward goal PT Goal: Sit at Edge Of Bed - Progress: Progressing toward goal PT Goal: Sit to Stand - Progress: Progressing toward goal PT Goal: Stand to Sit - Progress: Progressing toward goal PT Transfer Goal: Bed to Chair/Chair to Bed - Progress: Progressing toward goal PT Goal: Stand - Progress: Progressing toward goal  Visit Information  Last PT Received On: 01/20/12 Assistance Needed: +2    Subjective Data  Subjective: I want to get up but don't know that I can.   Cognition  Overall Cognitive Status: Impaired Area of Impairment: Attention;Following commands Arousal/Alertness: Awake/alert Orientation Level: Oriented X4 / Intact Behavior During Session: [facility] for tasks performed Current Attention Level: Focused Following Commands: Follows one step commands inconsistently  Balance  Static Sitting Balance Static Sitting - Balance Support: Right upper extremity supported;Feet supported Static Sitting - Level of Assistance: 4: Min assist Static Sitting - Comment/# of Minutes: Pt leaning heavily to right side initially but was able to sit upright in midline at min to min guard assist.    End of Session PT - End  of Session Equipment Utilized During Treatment: Gait belt Activity Tolerance: Patient limited by pain;Patient limited by fatigue Patient left: in chair;with call bell/phone within reach Nurse Communication: Mobility status;Need for lift equipment;Weight bearing status;Patient requests pain meds        INGOLD,Obdulio Mash 01/20/2012, 1:44 PM [facility] Acute Rehabilitation [phone] (pager)

## 2012-01-20 NOTE — Progress Notes (Addendum)
[facility] PROGRESS NOTE  Brittany SAYEGH [name]:[id number] DOB: 1927-04-05 DOA: 01/18/2012 PCP: Thomos Lemons, DO  Assessment/Plan: 1-Left hip fracture: s/p ORIF on 1/9. Pain control an dPT. Plan for SNF on discharge on monday.  2-Diabetes: Hold metformin. Start SSI.  CBG (last 3)   Basename 01/20/12 1633 01/20/12 1153 01/20/12 0604  GLUCAP 152* 217* 286*     3-Hypertension: Continue with Coreg, aspirin,   4-DVT Prophylaxis: on lovenox. 5. Anemia; possibly anemia from blood loss. Hemoglobin slowly dropping. Will continue to monitor.    Code Status: need to be addressed, with family.  Family Communication: none at bedside.  Disposition Plan: Monday to SNF.     Consultants:  orthopedics.  Procedures: ORIF ON 01/18/12  HPI/Subjective: PAINFUL left leg at the time of PT.  Objective: Filed Vitals:   01/20/12 0812 01/20/12 1200 01/20/12 1543 01/20/12 1620  BP: 137/41  112/51   Pulse: 75  68 66  Temp:   98.6 F (37 C)   TempSrc:      Resp:  18 18   Height:      Weight:      SpO2:  97% 93%     Intake/Output Summary (Last 24 hours) at 01/20/12 1743 Last data filed at 01/19/12 2100  Gross per 24 hour  Intake    240 ml  Output      0 ml  Net    240 ml   Filed Weights   01/19/12 2100 01/20/12 0446  Weight: 94.167 kg (207 lb 9.6 oz) 90.855 kg (200 lb 4.8 oz)    Exam:   General:  Alert afebrile comfortable  Cardiovascular: s1s2 RRR,  Respiratory: CTAB  Abdomen: soft NT NDBS+  Extremities: trace pedal edema. Left hip tenderness from the ORIF.    Data Reviewed: Basic Metabolic Panel:  Lab 01/20/12 9562 01/19/12 0535 01/18/12 1544 01/18/12 0826  NA 136 137 -- 138  K 4.2 4.2 -- 4.2  CL 100 102 -- 100  CO2 27 27 -- 27  GLUCOSE 168* 182* -- 238*  BUN 21 17 -- 18  CREATININE 0.72 0.70 0.58 0.60  CALCIUM 10.4 9.8 -- 10.3  MG -- -- -- --  PHOS -- -- -- --   Liver Function Tests: No results found for this basename:  AST:5,ALT:5,ALKPHOS:5,BILITOT:5,PROT:5,ALBUMIN:5 in the last 168 hours No results found for this basename: LIPASE:5,AMYLASE:5 in the last 168 hours No results found for this basename: AMMONIA:5 in the last 168 hours CBC:  Lab 01/20/12 0844 01/19/12 0535 01/18/12 1544 01/18/12 0826  WBC 10.0 9.8 18.4* 11.9*  NEUTROABS -- -- -- 8.9*  HGB 9.8* 10.6* 12.0 13.4  HCT 29.1* 31.9* 36.4 39.9  MCV 88.7 87.2 87.3 86.9  PLT 193 228 234 276   Cardiac Enzymes: No results found for this basename: CKTOTAL:5,CKMB:5,CKMBINDEX:5,TROPONINI:5 in the last 168 hours BNP (last 3 results) No results found for this basename: PROBNP:3 in the last 8760 hours CBG:  Lab 01/20/12 1633 01/20/12 1153 01/20/12 0604 01/19/12 2100 01/19/12 1620  GLUCAP 152* 217* 286* 183* 159*    No results found for this or any previous visit (from the past 240 hour(s)).   Studies: No results found.  Scheduled Meds:    . aspirin EC  81 mg Oral QHS  . B-complex with vitamin C  1 tablet Oral Daily  . carvedilol  12.5 mg Oral BID WC  . docusate sodium  100 mg Oral BID  . enoxaparin (LOVENOX) injection  40 mg Subcutaneous Q24H  .  insulin aspart  0-9 Units Subcutaneous TID WC   Continuous Infusions:   Principal Problem:  *Closed left hip fracture Active Problems:  DIABETES MELLITUS, TYPE II  HYPERLIPIDEMIA  HYPERSOMNIA UNSPECIFIED  PACEMAKER, PERMANENT        Brittany [name]  [facility] Pager [phone]. If 8PM-8AM, please contact night-coverage at [email or web address], password [facility] 01/20/2012, 5:43 PM  LOS: 2 days

## 2012-01-20 NOTE — Progress Notes (Signed)
Subjective: 2 Days Post-Op Procedure(s) (LRB): INTRAMEDULLARY (IM) NAIL FEMORAL (Left) Patient reports pain as 4 on 0-10 scale.   Relative;y comfortable Objective: Vital signs in last 24 hours: Temp:  [99.4 F (37.4 C)-100.2 F (37.9 C)] 99.4 F (37.4 C) (01/11 0446) Pulse Rate:  [71-78] 75  (01/11 0812) Resp:  [16-18] 18  (01/11 0446) BP: (110-137)/(38-60) 137/41 mmHg (01/11 0812) SpO2:  [91 %-98 %] 98 % (01/11 0446) Weight:  [90.855 kg (200 lb 4.8 oz)-94.167 kg (207 lb 9.6 oz)] 90.855 kg (200 lb 4.8 oz) (01/11 0446)  Intake/Output from previous day: 01/10 0701 - 01/11 0700 In: 960 [P.O.:960] Out: -  Intake/Output this shift:     Basename 01/20/12 0844 01/19/12 0535 01/18/12 1544 01/18/12 0826  HGB 9.8* 10.6* 12.0 13.4    Basename 01/20/12 0844 01/19/12 0535  WBC 10.0 9.8  RBC 3.28* 3.66*  HCT 29.1* 31.9*  PLT 193 228    Basename 01/20/12 0844 01/19/12 0535  NA 136 137  K 4.2 4.2  CL 100 102  CO2 27 27  BUN 21 17  CREATININE 0.72 0.70  GLUCOSE 168* 182*  CALCIUM 10.4 9.8   No results found for this basename: LABPT:2,INR:2 in the last 72 hours  Neurovascular intact Moves foot ankle well calf soft   Dressing Cand D Assessment/Plan: 2 Days Post-Op Procedure(s) (LRB): INTRAMEDULLARY (IM) NAIL FEMORAL (Left) Up with therapy WBAT  Brittany [name] ANDREW 01/20/2012, 10:46 AM

## 2012-01-21 DIAGNOSIS — S72009A Fracture of unspecified part of neck of unspecified femur, initial encounter for closed fracture: Secondary | ICD-10-CM

## 2012-01-21 LAB — CBC
HCT: 28.1 % — ABNORMAL LOW (ref 36.0–46.0)
Hemoglobin: 9.6 g/dL — ABNORMAL LOW (ref 12.0–15.0)
MCV: 87.8 fL (ref 78.0–100.0)
RDW: 13.9 % (ref 11.5–15.5)
WBC: 8.4 10*3/uL (ref 4.0–10.5)

## 2012-01-21 LAB — GLUCOSE, CAPILLARY: Glucose-Capillary: 180 mg/dL — ABNORMAL HIGH (ref 70–99)

## 2012-01-21 MED ORDER — HYDROCODONE-ACETAMINOPHEN 5-325 MG PO TABS
1.0000 | ORAL_TABLET | ORAL | Status: DC | PRN
  Administered 2012-01-21 – 2012-01-22 (×4): 2 via ORAL
  Filled 2012-01-21 (×4): qty 2

## 2012-01-21 NOTE — Progress Notes (Signed)
Subjective: 3 Days Post-Op Procedure(s) (LRB): INTRAMEDULLARY (IM) NAIL FEMORAL (Left) Patient reports pain as 3 on 0-10 scale.  Doing Well.Dressing changed and wounds look fine.  Objective: Vital signs in last 24 hours: Temp:  [98.6 F (37 C)-99.9 F (37.7 C)] 98.6 F (37 C) (01/12 0432) Pulse Rate:  [66-74] 68  (01/12 0432) Resp:  [18-19] 19  (01/12 0800) BP: (112-164)/(48-58) 164/58 mmHg (01/12 0432) SpO2:  [92 %-97 %] 94 % (01/12 0432)  Intake/Output from previous day: 01/11 0701 - 01/12 0700 In: -  Out: 300 [Urine:300] Intake/Output this shift:     Basename 01/21/12 0620 01/20/12 0844 01/19/12 0535 01/18/12 1544  HGB 9.6* 9.8* 10.6* 12.0    Basename 01/21/12 0620 01/20/12 0844  WBC 8.4 10.0  RBC 3.20* 3.28*  HCT 28.1* 29.1*  PLT 185 193    Basename 01/20/12 0844 01/19/12 0535  NA 136 137  K 4.2 4.2  CL 100 102  CO2 27 27  BUN 21 17  CREATININE 0.72 0.70  GLUCOSE 168* 182*  CALCIUM 10.4 9.8   No results found for this basename: LABPT:2,INR:2 in the last 72 hours  Dorsiflexion/Plantar flexion intact No cellulitis present  Assessment/Plan: 3 Days Post-Op Procedure(s) (LRB): INTRAMEDULLARY (IM) NAIL FEMORAL (Left) Up with therapy  Jory Tanguma A 01/21/2012, 10:44 AM

## 2012-01-21 NOTE — Progress Notes (Signed)
[facility] PROGRESS NOTE  HARSIMRAN WESTMAN ZOX:[id number] DOB: 03/02/1927 DOA: 01/18/2012 PCP: Thomos Lemons, DO  Assessment/Plan: 1-Left hip fracture: s/p ORIF on 1/9. Pain control an dPT. Plan for SNF on discharge on monday.  2-Diabetes: Hold metformin. Start SSI.  CBG (last 3)   Basename 01/21/12 8119 01/20/12 2110 01/20/12 1633  GLUCAP 145* 157* 152*     3-Hypertension: Continue with Coreg, aspirin,   4-DVT Prophylaxis: on lovenox. 5. Anemia; possibly anemia from blood loss. Hemoglobin stable at 9.6. Will continue to monitor.    Code Status: need to be addressed, with family.  Family Communication: none at bedside.  Disposition Plan: Monday to SNF.     Consultants:  orthopedics.  Procedures: ORIF ON 01/18/12  HPI/Subjective: PAINFUL left leg at the time of PT.  Objective: Filed Vitals:   01/21/12 0000 01/21/12 0400 01/21/12 0432 01/21/12 0800  BP:   164/58   Pulse:   68   Temp:   98.6 F (37 C)   TempSrc:   Oral   Resp: 18 18 19 19   Height:      Weight:      SpO2: 92% 93% 94%     Intake/Output Summary (Last 24 hours) at 01/21/12 1019 Last data filed at 01/21/12 0500  Gross per 24 hour  Intake      0 ml  Output    300 ml  Net   -300 ml   Filed Weights   01/19/12 2100 01/20/12 0446  Weight: 94.167 kg (207 lb 9.6 oz) 90.855 kg (200 lb 4.8 oz)    Exam:   General:  Alert afebrile comfortable  Cardiovascular: s1s2 RRR,  Respiratory: CTAB  Abdomen: soft NT NDBS+  Extremities: trace pedal edema. Left hip tenderness from the ORIF.    Data Reviewed: Basic Metabolic Panel:  Lab 01/20/12 1478 01/19/12 0535 01/18/12 1544 01/18/12 0826  NA 136 137 -- 138  K 4.2 4.2 -- 4.2  CL 100 102 -- 100  CO2 27 27 -- 27  GLUCOSE 168* 182* -- 238*  BUN 21 17 -- 18  CREATININE 0.72 0.70 0.58 0.60  CALCIUM 10.4 9.8 -- 10.3  MG -- -- -- --  PHOS -- -- -- --   Liver Function Tests: No results found for this basename:  AST:5,ALT:5,ALKPHOS:5,BILITOT:5,PROT:5,ALBUMIN:5 in the last 168 hours No results found for this basename: LIPASE:5,AMYLASE:5 in the last 168 hours No results found for this basename: AMMONIA:5 in the last 168 hours CBC:  Lab 01/21/12 0620 01/20/12 0844 01/19/12 0535 01/18/12 1544 01/18/12 0826  WBC 8.4 10.0 9.8 18.4* 11.9*  NEUTROABS -- -- -- -- 8.9*  HGB 9.6* 9.8* 10.6* 12.0 13.4  HCT 28.1* 29.1* 31.9* 36.4 39.9  MCV 87.8 88.7 87.2 87.3 86.9  PLT 185 193 228 234 276   Cardiac Enzymes: No results found for this basename: CKTOTAL:5,CKMB:5,CKMBINDEX:5,TROPONINI:5 in the last 168 hours BNP (last 3 results) No results found for this basename: PROBNP:3 in the last 8760 hours CBG:  Lab 01/21/12 0613 01/20/12 2110 01/20/12 1633 01/20/12 1153 01/20/12 0604  GLUCAP 145* 157* 152* 217* 286*    No results found for this or any previous visit (from the past 240 hour(s)).   Studies: No results found.  Scheduled Meds:    . aspirin EC  81 mg Oral QHS  . B-complex with vitamin C  1 tablet Oral Daily  . carvedilol  12.5 mg Oral BID WC  . docusate sodium  100 mg Oral BID  . enoxaparin (LOVENOX)  injection  40 mg Subcutaneous Q24H  . insulin aspart  0-9 Units Subcutaneous TID WC   Continuous Infusions:   Principal Problem:  *Closed left hip fracture Active Problems:  DIABETES MELLITUS, TYPE II  HYPERLIPIDEMIA  HYPERSOMNIA UNSPECIFIED  PACEMAKER, PERMANENT        Brittany [name]  [facility] Pager [phone]. If 8PM-8AM, please contact night-coverage at [email or web address], password [facility] 01/21/2012, 10:19 AM  LOS: 3 days

## 2012-01-22 LAB — GLUCOSE, CAPILLARY

## 2012-01-22 MED ORDER — DSS 100 MG PO CAPS: 100.0000 mg | ORAL_CAPSULE | Freq: Two times a day (BID) | ORAL | Status: DC

## 2012-01-22 MED ORDER — HYDROCODONE-ACETAMINOPHEN 5-325 MG PO TABS: 1.0000 | ORAL_TABLET | ORAL | Status: DC | PRN

## 2012-01-22 MED ORDER — BISACODYL 5 MG PO TBEC: 5.0000 mg | DELAYED_RELEASE_TABLET | Freq: Every day | ORAL | Status: DC | PRN

## 2012-01-22 NOTE — Progress Notes (Signed)
[name],PT Acute Rehabilitation [phone] (pager)  

## 2012-01-22 NOTE — Clinical Social Work Note (Signed)
CSW confirmed SNF bed at [facility]. CSW will f/u to facilitate dc once Pt is medically cleared.   Frederico Hamman, LCSW [phone]

## 2012-01-22 NOTE — Progress Notes (Signed)
Physical Therapy Treatment Patient Details Name: Brittany [name] MRN: [id number] DOB: 1927/10/18 Today's Date: 01/22/2012 Time: 7829-5621 PT Time Calculation (min): 24 min  PT Assessment / Plan / Recommendation Comments on Treatment Session  Pt s/p L IM nail.  Demonstrates improved mobility, however remains limited secondary to pain, weakness, decr ROM, and activity tolerance. Pt will continue to benefit from skilled PT to maximize mobility and independence.     Follow Up Recommendations  SNF           Equipment Recommendations  None recommended by PT       Frequency Min 4X/week   Plan Discharge plan remains appropriate;Frequency remains appropriate    Precautions / Restrictions Precautions Precautions: Fall Restrictions Weight Bearing Restrictions: Yes LLE Weight Bearing: Weight bearing as tolerated   Pertinent Vitals/Pain VSS Pain 7/10, RN aware    Mobility  Bed Mobility Bed Mobility: Supine to Sit;Sitting - Scoot to Edge of Bed Supine to Sit: 2: Max assist;With rails;HOB elevated Sitting - Scoot to [place] of Bed: 1: +2 Total assist Sitting - Scoot to Edge of Bed: Patient Percentage: 60% Details for Bed Mobility Assistance: improved mobility when pt instructed to move RLE then LLE, otherwise pt trys to move at same time causing incr pain and frustration Transfers Transfers: Sit to Stand;Stand to Sit;Stand Pivot Transfers Sit to Stand: 1: +2 Total assist;From bed;With upper extremity assist Sit to Stand: Patient Percentage: 50% Stand to Sit: 1: +2 Total assist;With upper extremity assist;To chair/3-in-1 Stand to Sit: Patient Percentage: 60% Stand Pivot Transfers: 1: +2 Total assist Stand Pivot Transfers: Patient Percentage: 50% Details for Transfer Assistance: Pt req incr time, vc for sequencing, hand placement, posture as pt prefered to flex forward, vc for UE WB to decr WB through LLE due to pain; took several small shuffle steps with limited weight shifting to  transfer to chair Ambulation/Gait Ambulation/Gait Assistance: Not tested (comment) Stairs: No Wheelchair Mobility Wheelchair Mobility: No    Exercises General Exercises - Lower Extremity Ankle Circles/Pumps: AROM;Both;10 reps;Seated Quad Sets: AROM;Both;5 reps;Supine Long Arc Quad: AROM;Both;5 reps;Seated Hip ABduction/ADduction: AROM;Both;5 reps;Seated     PT Goals Acute Rehab PT Goals PT Goal: Rolling Supine to Right Side - Progress: Progressing toward goal PT Goal: Rolling Supine to Left Side - Progress: Progressing toward goal PT Goal: Supine/Side to Sit - Progress: Progressing toward goal Pt will Sit at [facility] of Bed: Independently PT Goal: Sit at Edge Of Bed - Progress: Updated due to goal met PT Goal: Sit to Supine/Side - Progress: Progressing toward goal PT Goal: Sit to Stand - Progress: Progressing toward goal PT Goal: Stand to Sit - Progress: Progressing toward goal PT Transfer Goal: Bed to Chair/Chair to Bed - Progress: Progressing toward goal PT Goal: Stand - Progress: Progressing toward goal PT Goal: Ambulate - Progress: Progressing toward goal  Visit Information  Last PT Received On: 01/22/12 Assistance Needed: +2    Subjective Data  Subjective: I don't want to move into the chair but I guess I need to. Patient Stated Goal: To go to SNF for therapy prior to returning home   Cognition  Overall Cognitive Status: Appears within functional limits for tasks assessed/performed Arousal/Alertness: Awake/alert Orientation Level: Oriented X4 / Intact Behavior During Session: [facility] for tasks performed Cognition - Other Comments: less focused on pain today, able to follow commands consistantly and appropriately stays on task, req decr time to achieve task    Balance  Balance Balance Assessed: Yes Static Sitting Balance Static Sitting -  Balance Support: Bilateral upper extremity supported;Feet supported Static Sitting - Level of Assistance: 5: Stand by assistance Static  Sitting - Comment/# of Minutes: pt reports dizziness and woozy feeling, decr with <52min; sitting EOB 3-31min without lob, req UE assist to maintain balance.  Static Standing Balance Static Standing - Balance Support: Bilateral upper extremity supported;During functional activity Static Standing - Level of Assistance: 1: +2 Total assist Static Standing - Comment/# of Minutes: Pt= 70% standing for transfer  End of Session PT - End of Session Equipment Utilized During Treatment: Gait belt Activity Tolerance: Patient limited by fatigue;Patient limited by pain Patient left: in chair;with call bell/phone within reach Nurse Communication: Mobility status;Other (comment) (call bell needs to be fixed)       Sharion Balloon 01/22/2012, 9:34 AM Sharion Balloon, SPT Acute Rehab Services [phone]

## 2012-01-22 NOTE — Discharge Summary (Signed)
Physician Discharge Summary  Brittany [name] HYQ:[id number] DOB: November 19, 1927 DOA: 01/18/2012  PCP: Thomos Lemons, DO  Admit date: 01/18/2012 Discharge date: 01/22/2012  Time spent: 28 minutes  Recommendations for Outpatient Follow-up:  1. Follow up with ortho as recommended.  2. Please check CBC in 2 to3 days and follow up with PCP for further recommendations.   Discharge Diagnoses:  Principal Problem:  *Closed left hip fracture Active Problems:  DIABETES MELLITUS, TYPE II  HYPERLIPIDEMIA  HYPERSOMNIA UNSPECIFIED  PACEMAKER, PERMANENT   Discharge Condition: stable  Diet recommendation: LOW sodium diet  Filed Weights   01/19/12 2100 01/20/12 0446  Weight: 94.167 kg (207 lb 9.6 oz) 90.855 kg (200 lb 4.8 oz)    History of present illness:  Brittany [name] is a 79 y.o. female with PMH significant of AV block sp pacemaker, CAD, diabetes, present to hospital complaining of left hip pain after a fall. Patient was in her usual state of health when she stand up, turn around, loss balance, she slid her had on the wall and fell on the floor. No chest pain, dyspnea, lightheaded during episode. Her pacemake was interrogated, she think 3 months ago and was fine per patient. She denies chest pain or dyspnea on exertion. Her imaging showed intertrochanteric fracture of the left hip. And she was admitted to hospitalist service with ortho consulting for possible ORIF.    Hospital Course:  1-Left hip fracture: s/p ORIF on 1/9. Pain control and PT. Plan for SNF on discharge on monday.  2-Diabetes: resume metformin.  CBG (last 3)   Basename  01/21/12 0613  01/20/12 2110  01/20/12 1633   GLUCAP  145*  157*  152*    3-Hypertension: Continue with Coreg, aspirin,  4 Anemia; possibly anemia from blood loss. Hemoglobin stable at 9.6.    Consultations:  orthopedics  Discharge Exam: Filed Vitals:   01/22/12 0000 01/22/12 0400 01/22/12 0436 01/22/12 0900  BP:   153/49   Pulse:   66   Temp:   98.8 F  (37.1 C)   TempSrc:   Oral   Resp: 18 18 19    Height:      Weight:      SpO2: 95% 95% 96% 97%    General: Alert afebrile comfortable Cardiovascular: s1s2 RRR , Respiratory: CTAB  Abdomen: soft NT NDBS+  Extremities: trace pedal edema. Left hip tenderness from the ORIF.    Discharge Instructions  Discharge Orders    Future Orders Please Complete By Expires   Diet - low sodium heart healthy      Weight bearing as tolerated      Discharge instructions      Comments:   FOLLOW up with ortho as recommended.   Activity as tolerated - No restrictions          Medication List     As of 01/22/2012 12:40 PM    STOP taking these medications         BAYER BACK & BODY PAIN EX ST PO      TAKE these medications         aspirin EC 81 MG tablet   Take 81 mg by mouth at bedtime.      B-complex with vitamin C tablet   Take 1 tablet by mouth daily.      bisacodyl 5 MG EC tablet   Commonly known as: DULCOLAX   Take 1 tablet (5 mg total) by mouth daily as needed.      carvedilol  12.5 MG tablet   Commonly known as: COREG   Take 12.5 mg by mouth 2 (two) times daily with a meal.      Cinnamon 500 MG capsule   Take 1,000 mg by mouth 2 (two) times daily.      Co Q 10 100 MG Caps   Take 100 mg by mouth daily.      DSS 100 MG Caps   Take 100 mg by mouth 2 (two) times daily.      HYDROcodone-acetaminophen 5-325 MG per tablet   Commonly known as: NORCO/VICODIN   Take 1-2 tablets by mouth every 4 (four) hours as needed.      metFORMIN 500 MG 24 hr tablet   Commonly known as: GLUCOPHAGE-XR   Take 1,000 mg by mouth 2 (two) times daily.      multivitamin with minerals Tabs   Take 1 tablet by mouth daily.      OVER THE COUNTER MEDICATION   Take 1 tablet by mouth daily. Mega Red Extra Strength.      valsartan-hydrochlorothiazide 160-12.5 MG per tablet   Commonly known as: DIOVAN-HCT   Take 1 tablet by mouth daily.      vitamin E 1000 UNIT capsule   Take 1,000 Units by mouth  daily.           Follow-up Information    Follow up with SUPPLE,KEVIN M, MD. (call to be seen in 2-3 weeks)    Contact information:   [name], [address] [place] [address] [phone]           The results of significant diagnostics from this hospitalization (including imaging, microbiology, ancillary and laboratory) are listed below for reference.    Significant Diagnostic Studies: Dg Chest 1 View  01/18/2012  *RADIOLOGY REPORT*  Clinical Data: Fall, left hip pain  CHEST - 1 VIEW  Comparison: 01/21/2005  Findings: Chronic interstitial markings.  No focal consolidation.  Stable cardiomegaly.  Left subclavian pacemaker.  IMPRESSION: No evidence of acute cardiopulmonary disease.   Original Report Authenticated By: Charline Bills, M.D.    Dg Hip Complete Left  01/18/2012  *RADIOLOGY REPORT*  Clinical Data: Fall, left hip pain  LEFT HIP - COMPLETE 2+ VIEW  Comparison: None.  Findings: Possible nondisplaced/incomplete intertrochanteric left hip fracture, although only well visualized on a single view.  Bilateral hip joint spaces are symmetric.  Visualized bony pelvis appears intact.  IMPRESSION: Possible nondisplaced/incomplete intertrochanteric left hip fracture.  If the patient is non-weight-bearing, consider CT or MRI for confirmation.   Original Report Authenticated By: Charline Bills, M.D.    Dg Hip Operative Left  01/18/2012  *RADIOLOGY REPORT*  Clinical Data: Left intertrochanteric femur fracture.  OPERATIVE LEFT HIP  Comparison: 01/18/2012.  Findings: AP and lateral fluoroscopic spot films submitted for interpretation show gamma nail fixation of the left hip.  Short intermedullary nail is present with distal interlocking screw.  IMPRESSION: Gamma nail fixation of the left intertrochanteric femur fracture.   Original Report Authenticated By: Andreas Newport, M.D.    Ct Hip Left Wo Contrast  01/18/2012  *RADIOLOGY REPORT*  Clinical Data: Left  hip pain status post fall today.  CT OF THE LEFT HIP WITHOUT CONTRAST  Technique:  Multidetector CT imaging was performed according to the standard protocol. Multiplanar CT image reconstructions were also generated.  Comparison: Radiographs same date.  Findings: As suspected on earlier radiographs, there is an intertrochanteric left femur fracture.  This appears mildly displaced.  The left femoral head is intact.  There is no evidence of dislocation or femoral head avascular necrosis.  The visualized inferior left hemi pelvis is intact.  There are mild degenerative changes at the symphysis pubis.  No significant proximal thigh or pelvic hematoma is identified. There are scattered vascular calcifications and a moderate amount of stool within the rectum.  IMPRESSION: Mildly displaced intertrochanteric left femur fracture.   Original Report Authenticated By: Carey Bullocks, M.D.     Microbiology: No results found for this or any previous visit (from the past 240 hour(s)).   Labs: Basic Metabolic Panel:  Lab 01/20/12 1610 01/19/12 0535 01/18/12 1544 01/18/12 0826  NA 136 137 -- 138  K 4.2 4.2 -- 4.2  CL 100 102 -- 100  CO2 27 27 -- 27  GLUCOSE 168* 182* -- 238*  BUN 21 17 -- 18  CREATININE 0.72 0.70 0.58 0.60  CALCIUM 10.4 9.8 -- 10.3  MG -- -- -- --  PHOS -- -- -- --   Liver Function Tests: No results found for this basename: AST:5,ALT:5,ALKPHOS:5,BILITOT:5,PROT:5,ALBUMIN:5 in the last 168 hours No results found for this basename: LIPASE:5,AMYLASE:5 in the last 168 hours No results found for this basename: AMMONIA:5 in the last 168 hours CBC:  Lab 01/21/12 0620 01/20/12 0844 01/19/12 0535 01/18/12 1544 01/18/12 0826  WBC 8.4 10.0 9.8 18.4* 11.9*  NEUTROABS -- -- -- -- 8.9*  HGB 9.6* 9.8* 10.6* 12.0 13.4  HCT 28.1* 29.1* 31.9* 36.4 39.9  MCV 87.8 88.7 87.2 87.3 86.9  PLT 185 193 228 234 276   Cardiac Enzymes: No results found for this basename: CKTOTAL:5,CKMB:5,CKMBINDEX:5,TROPONINI:5  in the last 168 hours BNP: BNP (last 3 results) No results found for this basename: PROBNP:3 in the last 8760 hours CBG:  Lab 01/22/12 1125 01/22/12 0613 01/21/12 2055 01/21/12 1620 01/21/12 1119  GLUCAP 182* 149* 156* 171* 180*       Signed:  Tariana Moldovan  [facility] 01/22/2012, 12:40 PM

## 2012-01-22 NOTE — Progress Notes (Signed)
Brittany [name]  MRN: [id number] DOB/Age: 77-Dec-1929 77 y.o. Physician: Jacquelyne Balint Procedure: Procedure(s) (LRB): INTRAMEDULLARY (IM) NAIL FEMORAL (Left)     Subjective: Overall doing well. Pain mostly with mobility  Vital Signs Temp:  [98.2 F (36.8 C)-99.7 F (37.6 C)] 98.8 F (37.1 C) (01/13 0436) Pulse Rate:  [66-73] 66  (01/13 0436) Resp:  [18-22] 19  (01/13 0436) BP: (112-156)/(49-97) 153/49 mmHg (01/13 0436) SpO2:  [95 %-97 %] 97 % (01/13 0900)  Lab Results  Basename 01/21/12 0620 01/20/12 0844  WBC 8.4 10.0  HGB 9.6* 9.8*  HCT 28.1* 29.1*  PLT 185 193   BMET  Basename 01/20/12 0844  NA 136  K 4.2  CL 100  CO2 27  GLUCOSE 168*  BUN 21  CREATININE 0.72  CALCIUM 10.4   No results found for this basename: inr     Exam Incisions dry, bruising to hip noted NVI        Plan Agree with DC to [facility] today Fu with [place] in 2-3 weeks Rx in chart by medical service Appreciate their care  [facility] for Dr.Kevin Supple 01/22/2012, 2:00 PM

## 2012-01-22 NOTE — Progress Notes (Signed)
Report called to Kathie Rhodes, Charity fundraiser at [employer]. Pt is stable for discharge. Pt's dtr aware of pt being discharged. EMS arrived to take pt to SNF.

## 2012-01-24 NOTE — Clinical Social Work Placement (Signed)
Late entry 01/24/12 for 01/22/12  Clinical Social Work Department CLINICAL SOCIAL WORK PLACEMENT NOTE 01/24/2012  Patient:  Brittany [name], Brittany [name]  Account Number:  [id number] Admit date:  01/18/2012  Clinical Social Worker:  Frederico Hamman, LCSW  Date/time:  01/21/2011 12:00 M  Clinical Social Work is seeking post-discharge placement for this patient at the following level of care:   SKILLED NURSING   (*CSW will update this form in Epic as items are completed)   01/19/2012  Patient/family provided with [facility] Department of Clinical Social Work's list of facilities offering this level of care within the geographic area requested by the patient (or if unable, by the patient's family).  01/19/2012  Patient/family informed of their freedom to choose among providers that offer the needed level of care, that participate in Medicare, Medicaid or managed care program needed by the patient, have an available bed and are willing to accept the patient.  01/19/2012  Patient/family informed of [facility]' ownership interest in [facility], as well as of the fact that they are under no obligation to receive care at this facility.  PASARR submitted to EDS on  PASARR number received from EDS on   FL2 transmitted to all facilities in geographic area requested by pt/family on  01/19/2012 FL2 transmitted to all facilities within larger geographic area on   Patient informed that his/her managed care company has contracts with or will negotiate with  certain facilities, including the following:     Patient/family informed of bed offers received:  01/22/2012 Patient chooses bed at [facility] Physician recommends and patient chooses bed at    Patient to be transferred to [place] on  01/22/2012 Patient to be transferred to facility by ptar  The following physician request were entered in Epic:   Additional Comments: Pasarr on file   Frederico Hamman,  LCSW [phone]

## 2012-03-14 ENCOUNTER — Encounter: Payer: Self-pay | Admitting: Internal Medicine

## 2012-03-14 DIAGNOSIS — I442 Atrioventricular block, complete: Secondary | ICD-10-CM

## 2012-06-18 ENCOUNTER — Encounter: Payer: Medicare Other | Admitting: Internal Medicine

## 2012-06-20 ENCOUNTER — Encounter: Payer: Self-pay | Admitting: Internal Medicine

## 2012-06-20 ENCOUNTER — Ambulatory Visit (INDEPENDENT_AMBULATORY_CARE_PROVIDER_SITE_OTHER): Payer: Medicare Other | Admitting: Internal Medicine

## 2012-06-20 VITALS — BP 152/86 | HR 82 | Ht 64.0 in | Wt 189.4 lb

## 2012-06-20 DIAGNOSIS — I442 Atrioventricular block, complete: Secondary | ICD-10-CM

## 2012-06-20 DIAGNOSIS — R209 Unspecified disturbances of skin sensation: Secondary | ICD-10-CM

## 2012-06-20 DIAGNOSIS — I10 Essential (primary) hypertension: Secondary | ICD-10-CM

## 2012-06-20 DIAGNOSIS — R200 Anesthesia of skin: Secondary | ICD-10-CM | POA: Insufficient documentation

## 2012-06-20 DIAGNOSIS — Z950 Presence of cardiac pacemaker: Secondary | ICD-10-CM

## 2012-06-20 LAB — PACEMAKER DEVICE OBSERVATION
AL IMPEDENCE PM: 402 Ohm
BATTERY VOLTAGE: 2.75 V
DEVICE MODEL PM: 1643955

## 2012-06-20 NOTE — Assessment & Plan Note (Signed)
Modestly elevated. However, given her recent fall I am reluctant to make any adjustments

## 2012-06-20 NOTE — Assessment & Plan Note (Signed)
Stable post pacing 

## 2012-06-20 NOTE — Assessment & Plan Note (Signed)
The patient had a fall from a chair 2 days ago and has lower extremity numbness. I am concerned that suggested she go to the emergency room for further evaluation. She has noted no weakness. I failed to ask her change in bladder or bowel function

## 2012-06-20 NOTE — Progress Notes (Signed)
sk[facility] Patient Care Team: Joette Catching, MD as PCP - General (Family Medicine) Duke Salvia, MD (Cardiology)   HPI  Brittany [name] is a 79 y.o. female is seen in followup for complete heart block she is status post pacemaker implantation  she is having no problem related to this.  She has a history of prior LAD POBA with normal left ventricular function.  The patient denies chest pain, shortness of breath, nocturnal dyspnea, orthopnea or peripheral edema. There have been no palpitations, lightheadedness or syncope.    She fell from her chair 2 days ago. Since that time she has had numbness and tingling in her feet bilaterally. It is no different today than yesterday. She has noted no impairment in her walking.   Past Medical History  Diagnosis Date  . CAD (coronary artery disease)   . Pacemaker   . AV block   . Hyperlipidemia   . Hypertension   . Diabetes mellitus     Type II  . Glaucoma     Past Surgical History  Procedure Laterality Date  . Tonsillectomy  1937  . Breast biopsy  1991  . Left total knee replacement  1996  . Pacemaker insertion      St. Jude  . Femur im nail  01/18/2012    Procedure: INTRAMEDULLARY (IM) NAIL FEMORAL;  Surgeon: Senaida Lange, MD;  Location: [facility] OR;  Service: Orthopedics;  Laterality: Left;    Current Outpatient Prescriptions  Medication Sig Dispense Refill  . aspirin EC 81 MG tablet Take 81 mg by mouth at bedtime.      . bisacodyl (DULCOLAX) 5 MG EC tablet Take 1 tablet (5 mg total) by mouth daily as needed.  30 tablet  0  . carvedilol (COREG) 12.5 MG tablet Take 12.5 mg by mouth 2 (two) times daily with a meal.      . Cinnamon 500 MG capsule Take 1,000 mg by mouth 2 (two) times daily.      . Coenzyme Q10 (CO Q 10) 100 MG CAPS Take 100 mg by mouth daily.      Marland Kitchen docusate sodium 100 MG CAPS Take 100 mg by mouth 2 (two) times daily.  10 capsule  0  . metFORMIN (GLUCOPHAGE-XR) 500 MG 24 hr tablet Take 1,000 mg by mouth 2 (two) times daily.       . Multiple Vitamin (MULTIVITAMIN WITH MINERALS) TABS Take 1 tablet by mouth daily.      Marland Kitchen OVER THE COUNTER MEDICATION Take 1 tablet by mouth daily. Mega Red Extra Strength.      . valsartan-hydrochlorothiazide (DIOVAN-HCT) 160-12.5 MG per tablet Take 1 tablet by mouth daily.       No current facility-administered medications for this visit.    Allergies  Allergen Reactions  . Ace Inhibitors     REACTION: cough and sore throat  . Atorvastatin     REACTION: Myalgia  . Ciprofloxacin Other (See Comments)    REACTION:  unknown  . Crestor (Rosuvastatin) Other (See Comments)    REACTION:  unknown  . Plavix (Clopidogrel Bisulfate) Other (See Comments)    REACTION:  unknown    Review of Systems negative except from HPI and PMH  Physical Exam BP 152/86  Pulse 82  Ht 5\' 4"  (1.626 m)  Wt 189 lb 6.4 oz (85.911 kg)  BMI 32.49 kg/m2 Well developed and well nourished in no acute distress HENT normal E scleral and icterus clear Neck Supple JVP flat; carotids brisk and full  Clear to ausculation  Regular rate and rhythm, no murmurs gallops or rub Soft with active bowel sounds No clubbing cyanosis none Edema Alert and oriented, grossly normal motor and sensory function Skin Warm and Dry some ecchymosis on the dorsum of her left foot  ECG demonstrates  P-synchronous/ AV  pacing   Assessment and  Plan

## 2012-06-20 NOTE — Assessment & Plan Note (Signed)
The patient's device was interrogated.  The information was reviewed. No changes were made in the programming.    

## 2012-06-26 ENCOUNTER — Encounter: Payer: Self-pay | Admitting: Internal Medicine

## 2012-09-17 ENCOUNTER — Telehealth: Payer: Self-pay | Admitting: Internal Medicine

## 2012-09-17 DIAGNOSIS — I442 Atrioventricular block, complete: Secondary | ICD-10-CM

## 2012-09-17 NOTE — Telephone Encounter (Signed)
Patient concerned because earlier today when she checked her PPM with [employer] in the magnet mode her transmitter shut off.  I assured her that her PPM is still functioning and the magnet will not turn her PPM off and [employer] had not notified us with any urgent status.  We will follow as scheduled.  Patient is in agreement.

## 2012-09-17 NOTE — Telephone Encounter (Signed)
New Problem  Pt states she was conducting a remote test states it "went dead" and came back on... She is not sure if it is still working///

## 2012-10-15 ENCOUNTER — Encounter: Payer: Self-pay | Admitting: Internal Medicine

## 2012-10-21 ENCOUNTER — Emergency Department (HOSPITAL_COMMUNITY): Payer: Medicare Other

## 2012-10-21 ENCOUNTER — Emergency Department (HOSPITAL_COMMUNITY)
Admission: EM | Admit: 2012-10-21 | Discharge: 2012-10-22 | Disposition: A | Discharge status: Home / Self Care | Payer: Medicare Other | Attending: Emergency Medicine | Admitting: Emergency Medicine

## 2012-10-21 ENCOUNTER — Encounter (HOSPITAL_COMMUNITY): Payer: Self-pay | Admitting: Emergency Medicine

## 2012-10-21 DIAGNOSIS — Z79899 Other long term (current) drug therapy: Secondary | ICD-10-CM | POA: Insufficient documentation

## 2012-10-21 DIAGNOSIS — I2510 Atherosclerotic heart disease of native coronary artery without angina pectoris: Secondary | ICD-10-CM | POA: Insufficient documentation

## 2012-10-21 DIAGNOSIS — R142 Eructation: Secondary | ICD-10-CM | POA: Diagnosis present

## 2012-10-21 DIAGNOSIS — R141 Gas pain: Secondary | ICD-10-CM | POA: Insufficient documentation

## 2012-10-21 DIAGNOSIS — Z950 Presence of cardiac pacemaker: Secondary | ICD-10-CM | POA: Insufficient documentation

## 2012-10-21 DIAGNOSIS — E119 Type 2 diabetes mellitus without complications: Secondary | ICD-10-CM | POA: Insufficient documentation

## 2012-10-21 DIAGNOSIS — R079 Chest pain, unspecified: Secondary | ICD-10-CM | POA: Insufficient documentation

## 2012-10-21 DIAGNOSIS — R0602 Shortness of breath: Secondary | ICD-10-CM | POA: Diagnosis present

## 2012-10-21 DIAGNOSIS — Z87891 Personal history of nicotine dependence: Secondary | ICD-10-CM | POA: Insufficient documentation

## 2012-10-21 DIAGNOSIS — I10 Essential (primary) hypertension: Secondary | ICD-10-CM | POA: Insufficient documentation

## 2012-10-21 DIAGNOSIS — E785 Hyperlipidemia, unspecified: Secondary | ICD-10-CM | POA: Insufficient documentation

## 2012-10-21 DIAGNOSIS — Z8669 Personal history of other diseases of the nervous system and sense organs: Secondary | ICD-10-CM | POA: Insufficient documentation

## 2012-10-21 DIAGNOSIS — Y831 Surgical operation with implant of artificial internal device as the cause of abnormal reaction of the patient, or of later complication, without mention of misadventure at the time of the procedure: Secondary | ICD-10-CM | POA: Insufficient documentation

## 2012-10-21 LAB — COMPREHENSIVE METABOLIC PANEL
ALT: 13 U/L (ref 0–35)
Alkaline Phosphatase: 61 U/L (ref 39–117)
BUN: 19 mg/dL (ref 6–23)
CO2: 25 mEq/L (ref 19–32)
GFR calc Af Amer: >90 mL/min (ref >90)
GFR calc non Af Amer: 85 mL/min — ABNORMAL LOW (ref >90)
Glucose, Bld: 127 mg/dL — ABNORMAL HIGH (ref 70–99)
Potassium: 3.7 mEq/L (ref 3.5–5.1)
Sodium: 142 mEq/L (ref 135–145)
Total Protein: 7.3 g/dL (ref 6.0–8.3)

## 2012-10-21 LAB — CBC WITH DIFFERENTIAL/PLATELET
Eosinophils Absolute: 0.2 10*3/uL (ref 0.0–0.7)
Eosinophils Relative: 2 % (ref 0–5)
Hemoglobin: 13.2 g/dL (ref 12.0–15.0)
Lymphocytes Relative: 31 % (ref 12–46)
Lymphs Abs: 2.2 10*3/uL (ref 0.7–4.0)
MCH: 29.1 pg (ref 26.0–34.0)
MCV: 85.2 fL (ref 78.0–100.0)
Monocytes Relative: 13 % — ABNORMAL HIGH (ref 3–12)
Neutrophils Relative %: 54 % (ref 43–77)
Platelets: 220 10*3/uL (ref 150–400)
RBC: 4.54 MIL/uL (ref 3.87–5.11)
WBC: 7 10*3/uL (ref 4.0–10.5)

## 2012-10-21 LAB — TROPONIN I: Troponin I: <0.3 ng/mL (ref <0.30)

## 2012-10-21 MED ORDER — SIMETHICONE 40 MG/0.6ML PO SUSP (UNIT DOSE)
40.0000 mg | Freq: Once | ORAL | Status: AC
  Administered 2012-10-21: 40 mg via ORAL
  Filled 2012-10-21: qty 0.6

## 2012-10-21 MED ORDER — NITROGLYCERIN 0.4 MG SL SUBL: 0.4000 mg | SUBLINGUAL_TABLET | SUBLINGUAL | Status: DC | PRN

## 2012-10-21 NOTE — ED Provider Notes (Signed)
CSN: [id number]     Arrival date & time 10/21/12  2036 History   First MD Initiated Contact with Patient 10/21/12 2146     Chief Complaint  Patient presents with  . Pacemaker Problem   (Consider location/radiation/quality/duration/timing/severity/associated sxs/prior Treatment) HPI Comments: 79 year old female who presents with inability to fully exhale. The patient has had similar symptoms previously which were relieved by belching. She notes that she has had increased belching today. She denies any shortness of breath, chest pain, fever or increased swelling in her extremities. Her symptoms began sometime earlier this morning. They have persisted throughout the day. She denies any other associated symptoms. Nothing has relieved her symptoms thus far. The timing of her symptoms is constant. Severity is noted to be mild.  The history is provided by the patient.    Past Medical History  Diagnosis Date  . CAD (coronary artery disease)   . Pacemaker   . AV block   . Hyperlipidemia   . Hypertension   . Diabetes mellitus     Type II  . Glaucoma    Past Surgical History  Procedure Laterality Date  . Tonsillectomy  1937  . Breast biopsy  1991  . Left total knee replacement  1996  . Pacemaker insertion      St. Jude  . Femur im nail  01/18/2012    Procedure: INTRAMEDULLARY (IM) NAIL FEMORAL;  Surgeon: Senaida Lange, MD;  Location: [facility] OR;  Service: Orthopedics;  Laterality: Left;   Family History  Problem Relation Age of Onset  . Cancer    . Coronary artery disease    . Stroke    . Diabetes    . Hypertension     History  Substance Use Topics  . Smoking status: Former Smoker    Types: Cigarettes    Quit date: 01/17/1951  . Smokeless tobacco: Never Used  . Alcohol Use: Yes   OB History   Grav Para Term Preterm Abortions TAB SAB Ect Mult Living                 Review of Systems  Constitutional: Negative for fever and fatigue.  HENT: Negative for congestion and drooling.    Eyes: Negative for pain.  Respiratory: Negative for cough and shortness of breath.   Cardiovascular: Negative for chest pain.  Gastrointestinal: Negative for nausea, vomiting, abdominal pain and diarrhea.  Genitourinary: Negative for dysuria and hematuria.  Musculoskeletal: Negative for back pain, gait problem and neck pain.  Skin: Negative for color change.  Neurological: Negative for dizziness and headaches.  Hematological: Negative for adenopathy.  Psychiatric/Behavioral: Negative for behavioral problems.  All other systems reviewed and are negative.    Allergies  Ace inhibitors; Atorvastatin; Ciprofloxacin; Crestor; and Plavix  Home Medications   Current Outpatient Rx  Name  Route  Sig  Dispense  Refill  . aspirin EC 81 MG tablet   Oral   Take 81 mg by mouth at bedtime.         . carvedilol (COREG) 12.5 MG tablet   Oral   Take 12.5 mg by mouth 2 (two) times daily with a meal.         . Cinnamon 500 MG capsule   Oral   Take 1,000 mg by mouth 2 (two) times daily.         . Coenzyme Q10 (CO Q 10) 100 MG CAPS   Oral   Take 100 mg by mouth daily. Hold while in hospital         .  metFORMIN (GLUCOPHAGE-XR) 500 MG 24 hr tablet   Oral   Take 1,000 mg by mouth 2 (two) times daily.         . Multiple Vitamin (MULTIVITAMIN WITH MINERALS) TABS   Oral   Take 1 tablet by mouth daily.         Marland Kitchen OVER THE COUNTER MEDICATION   Oral   Take 1 tablet by mouth daily. Mega Red Extra Strength.         . valsartan-hydrochlorothiazide (DIOVAN-HCT) 160-12.5 MG per tablet   Oral   Take 1 tablet by mouth daily.          BP 136/68  Pulse 67  Temp(Src) 99 F (37.2 C)  Resp 13  Ht 5\' 3"  (1.6 m)  SpO2 97% Physical Exam  Nursing note and vitals reviewed. Constitutional: She is oriented to person, place, and time. She appears well-developed and well-nourished.  Multiple belches on exam.   HENT:  Head: Normocephalic.  Mouth/Throat: Oropharynx is clear and moist. No  oropharyngeal exudate.  Eyes: Conjunctivae and EOM are normal. Pupils are equal, round, and reactive to light.  Neck: Normal range of motion. Neck supple.  Cardiovascular: Normal rate, regular rhythm, normal heart sounds and intact distal pulses.  Exam reveals no gallop and no friction rub.   No murmur heard. Pulmonary/Chest: Effort normal and breath sounds normal. No respiratory distress. She has no wheezes.  Abdominal: Soft. Bowel sounds are normal. There is no tenderness. There is no rebound and no guarding.  Musculoskeletal: Normal range of motion. She exhibits no edema and no tenderness.  Neurological: She is alert and oriented to person, place, and time.  Skin: Skin is warm and dry.  Psychiatric: She has a normal mood and affect. Her behavior is normal.    ED Course  Procedures (including critical care time) Labs Review Labs Reviewed  CBC WITH DIFFERENTIAL - Abnormal; Notable for the following:    Monocytes Relative 13 (*)    All other components within normal limits  COMPREHENSIVE METABOLIC PANEL - Abnormal; Notable for the following:    Glucose, Bld 127 (*)    Total Bilirubin 0.2 (*)    GFR calc non Af Amer 85 (*)    All other components within normal limits  PRO B NATRIURETIC PEPTIDE - Abnormal; Notable for the following:    Pro B Natriuretic peptide (BNP) 473.8 (*)    All other components within normal limits  TROPONIN I   Imaging Review Dg Chest 2 View  10/21/2012   CLINICAL DATA:  Chest pain  EXAM: CHEST  2 VIEW  COMPARISON:  10/21/2012  FINDINGS: Biventricular left approach pacer has a stable orientation. Stable mild cardiomegaly. Unchanged mediastinal contours.  Mild hyperinflation and chronic interstitial coarsening. No infiltrate, edema, effusion, or pneumothorax. Calcified left lower pulmonary granuloma. Exaggerated thoracic kyphosis without evidence of acute fracture.  IMPRESSION: No active cardiopulmonary disease.   Electronically Signed   By: Tiburcio Pea M.D.    On: 10/21/2012 23:26   Dg Abd 2 Views  10/21/2012   CLINICAL DATA:  Increased belching  EXAM: ABDOMEN - 2 VIEW  COMPARISON:  None currently available  FINDINGS: Formed stool present in nearly every colonic segment. No proximal obstruction. No suspicious intra-abdominal mass effect or calcification.  Diffuse degenerative disc disease, possibly with lumbar compression deformities. Remote left proximal femur ORIF.  Cardiomegaly. Biventricular pacer from the left. Left lower lobe calcified pulmonary nodule.  IMPRESSION: Possible constipation, without obstruction.   Electronically Signed  By: Tiburcio Pea M.D.   On: 10/21/2012 23:28    EKG Interpretation     Ventricular Rate:  66 PR Interval:  237 QRS Duration: 152 QT Interval:  444 QTC Calculation: 466 R Axis:   -72 Text Interpretation:  Sinus rhythm Prolonged PR interval Left bundle branch block Probable RV involvement, suggest recording right precordial leads Baseline wander in lead(s) II III aVF No concordance or excessive discordance            MDM   1. Belching   2. SOB (shortness of breath)    10:20 PM 79 y.o. female with history of coronary artery disease, diabetes who presents with a sensation of not being able to fully exhale which began this morning. She notes she has had similar symptoms in the past which were relieved with belching. She does have increased belching on exam. She denies any shortness of breath or chest pain. She has no other symptoms on exam. She is afebrile and vital signs are unremarkable here. Will get screening lab work and screening plain films.  I suspect her symptoms may be due to increased intestinal gas.  12:45 AM: Pt has had mild relief w/ simethicone and gi cocktail. Pt continues to appear well. Labs/imaging non-contrib. Will rec close f/u w/ pcp.  I have discussed the diagnosis/risks/treatment options with the patient and family and believe the pt to be eligible for discharge home to follow-up  with pcp tomorrow. We also discussed returning to the ED immediately if new or worsening sx occur. We discussed the sx which are most concerning (e.g., worsening sx, sob, cp) that necessitate immediate return. Any new prescriptions provided to the patient are listed below.   Junius Argyle, MD 10/22/12 1220

## 2012-10-21 NOTE — ED Notes (Signed)
Patient transported to X-ray 

## 2012-10-21 NOTE — ED Notes (Addendum)
Patient arrived via [facility] from [facility] with concerns that according to her "My pacemaker is not acting right". She has left sided discomfort not described as pain. VSS, A/O.

## 2012-10-21 NOTE — ED Notes (Signed)
Patient states to me that she feels like she can not completely exhale. She denies any chest discomfort at this time.

## 2012-10-22 DIAGNOSIS — R0602 Shortness of breath: Secondary | ICD-10-CM | POA: Diagnosis present

## 2012-10-22 DIAGNOSIS — R142 Eructation: Secondary | ICD-10-CM | POA: Diagnosis present

## 2012-10-22 MED ORDER — GI COCKTAIL ~~LOC~~
30.0000 mL | Freq: Once | ORAL | Status: AC
  Administered 2012-10-22: 30 mL via ORAL
  Filled 2012-10-22: qty 30

## 2012-12-12 ENCOUNTER — Other Ambulatory Visit: Payer: Self-pay

## 2012-12-12 MED ORDER — CARVEDILOL 12.5 MG PO TABS: 12.5000 mg | ORAL_TABLET | Freq: Two times a day (BID) | ORAL | Status: DC

## 2012-12-17 ENCOUNTER — Encounter: Payer: Self-pay | Admitting: Internal Medicine

## 2012-12-17 DIAGNOSIS — I442 Atrioventricular block, complete: Secondary | ICD-10-CM

## 2012-12-23 ENCOUNTER — Other Ambulatory Visit: Payer: Self-pay | Admitting: Internal Medicine

## 2013-03-18 ENCOUNTER — Encounter: Payer: Self-pay | Admitting: Internal Medicine

## 2013-03-18 DIAGNOSIS — I442 Atrioventricular block, complete: Secondary | ICD-10-CM

## 2013-04-29 ENCOUNTER — Ambulatory Visit (INDEPENDENT_AMBULATORY_CARE_PROVIDER_SITE_OTHER): Payer: Medicare Other | Admitting: Cardiology

## 2013-04-29 VITALS — BP 162/80 | HR 69 | Wt 189.0 lb

## 2013-04-29 DIAGNOSIS — I10 Essential (primary) hypertension: Secondary | ICD-10-CM

## 2013-04-29 DIAGNOSIS — I442 Atrioventricular block, complete: Secondary | ICD-10-CM

## 2013-04-29 DIAGNOSIS — M79602 Pain in left arm: Secondary | ICD-10-CM

## 2013-04-29 DIAGNOSIS — Z950 Presence of cardiac pacemaker: Secondary | ICD-10-CM

## 2013-04-29 DIAGNOSIS — M79609 Pain in unspecified limb: Secondary | ICD-10-CM

## 2013-04-29 DIAGNOSIS — I2510 Atherosclerotic heart disease of native coronary artery without angina pectoris: Secondary | ICD-10-CM

## 2013-04-29 LAB — MDC_IDC_ENUM_SESS_TYPE_INCLINIC
Battery Impedance: 3200 Ohm
Battery Voltage: 2.75 V
Implantable Pulse Generator Model: 5816
Implantable Pulse Generator Serial Number: 1643955
Lead Channel Impedance Value: 1021 Ohm
Lead Channel Impedance Value: 410 Ohm
Lead Channel Setting Pacing Pulse Width: 0.5 ms
Lead Channel Setting Sensing Sensitivity: 2 mV
MDC IDC MSMT LEADCHNL RA PACING THRESHOLD AMPLITUDE: 0.75 V
MDC IDC MSMT LEADCHNL RA PACING THRESHOLD PULSEWIDTH: 0.5 ms
MDC IDC SESS DTM: 20150421190150
MDC IDC SET LEADCHNL RA PACING AMPLITUDE: 2 V

## 2013-04-29 NOTE — Progress Notes (Signed)
ELECTROPHYSIOLOGY OFFICE NOTE   Patient ID: Brittany [name] MRN: [id number], DOB/AGE: 14-Sep-1927   Date of Visit: 04/29/2013  Primary Physician: Lysbeth GalasNyland, MD Primary Cardiologist: previously Juanda ChanceBrodie, MD / Diona BrownerMcDowell, MD Primary EP: Graciela HusbandsKlein, MD Reason for Visit: EP/device follow-up  History of Present Illness  Brittany LundborgMarlyn M Hanken is a 80 y.o. female with complete heart block s/p PPM implant, CAD, HTN and DM who presents today for EP follow-up. Since last being seen in our clinic, she reports she has had intermittent left sided arm and jaw pain. She describes it as "achy." These symptoms occur both with activity and at rest, lasting less than 5 minutes usually. She denies any alleviating or aggravating factors. She denies chest pain or shortness of breath. She denies palpitations, dizziness, near syncope or syncope. She denies LE swelling, orthopnea or PND. She is compliant with medications.  Past Medical History Past Medical History  Diagnosis Date  . CAD (coronary artery disease)   . Pacemaker   . AV block   . Hyperlipidemia   . Hypertension   . Diabetes mellitus     Type II  . Glaucoma     Past Surgical History Past Surgical History  Procedure Laterality Date  . Tonsillectomy  1937  . Breast biopsy  1991  . Left total knee replacement  1996  . Pacemaker insertion      St. Jude  . Femur im nail  01/18/2012    Procedure: INTRAMEDULLARY (IM) NAIL FEMORAL;  Surgeon: Senaida LangeKevin M Supple, MD;  Location: [facility] OR;  Service: Orthopedics;  Laterality: Left;    Allergies/Intolerances Allergies  Allergen Reactions  . Ace Inhibitors     REACTION: cough and sore throat  . Atorvastatin     REACTION: Myalgia  . Ciprofloxacin Other (See Comments)    REACTION:  unknown  . Crestor [Rosuvastatin] Other (See Comments)    REACTION:  unknown  . Plavix [Clopidogrel Bisulfate] Other (See Comments)    REACTION:  unknown    Current Home Medications Current Outpatient Prescriptions  Medication Sig Dispense  Refill  . aspirin EC 81 MG tablet Take 81 mg by mouth at bedtime.      . carvedilol (COREG) 12.5 MG tablet Take 1 tablet (12.5 mg total) by mouth 2 (two) times daily with a meal.  60 tablet  4  . Cholecalciferol (VITAMIN D PO) Take 2 tablets by mouth daily.      . Cinnamon 500 MG capsule Take 1,000 mg by mouth 2 (two) times daily.      . Coenzyme Q10 (CO Q 10) 100 MG CAPS Take 100 mg by mouth daily. Hold while in hospital      . Cyanocobalamin (VITAMIN B-12 PO) Take 1 tablet by mouth daily.      Marland Kitchen. DOCUSATE SODIUM PO Take by mouth as needed.      . metFORMIN (GLUCOPHAGE-XR) 500 MG 24 hr tablet Take 1,000 mg by mouth 2 (two) times daily.      . Multiple Vitamin (MULTIVITAMIN WITH MINERALS) TABS Take 1 tablet by mouth daily.      Marland Kitchen. OVER THE COUNTER MEDICATION Take 1 tablet by mouth daily. Mega Red Extra Strength.      . valsartan-hydrochlorothiazide (DIOVAN-HCT) 160-12.5 MG per tablet Take 1 tablet by mouth daily.      Marland Kitchen. VITAMIN E PO Take 1 tablet by mouth daily.       No current facility-administered medications for this visit.    Social History History   Social History  .  Marital Status: Divorced    Spouse Name: N/A    Number of Children: N/A  . Years of Education: N/A   Occupational History  . Retired    Social History Main Topics  . Smoking status: Former Smoker    Types: Cigarettes    Quit date: 01/17/1951  . Smokeless tobacco: Never Used  . Alcohol Use: Yes  . Drug Use: No  . Sexual Activity: Not on file   Other Topics Concern  . Not on file   Social History Narrative  . No narrative on file     Review of Systems General: No chills, fever, night sweats or weight changes Cardiovascular: No dyspnea on exertion, edema, orthopnea, palpitations, paroxysmal nocturnal dyspnea Dermatological: No rash, lesions or masses Respiratory: No cough, dyspnea Urologic: No hematuria, dysuria Abdominal: No nausea, vomiting, diarrhea, bright red blood per rectum, melena, or  hematemesis Neurologic: No visual changes, weakness, changes in mental status All other systems reviewed and are otherwise negative except as noted above.  Physical Exam Vitals: Blood pressure 162/80, pulse 69, weight 189 lb (85.73 kg).  General: Well developed, well appearing 80 y.o. female in no acute distress. HEENT: Normocephalic, atraumatic. EOMs intact. Sclera nonicteric. Oropharynx clear.  Neck: Supple. No JVD. Lungs: Respirations regular and unlabored, CTA bilaterally. No wheezes, rales or rhonchi. Heart: RRR. S1, S2 present. No murmurs, rub, S3 or S4. Abdomen: Soft, non-distended.  Extremities: No clubbing, cyanosis or edema. PT/Radials 2+ and equal bilaterally. Psych: Normal affect. Neuro: Alert and oriented X 3. Moves all extremities spontaneously. Skin: Left upper chest / implant site intact and well healed.    Diagnostics Echocardiogram (none in EPIC since 2006) SUMMARY - Overall left ventricular systolic function was normal. Left ventricular ejection fraction was estimated, range 55% to 65%. There were no left ventricular regional wall motion abnormalities. Left ventricular wall thickness was mildly increased. - There was mild mitral annular calcification. - Left atrial size was at the upper limits of normal. - There was mild right ventricular hypertrophy. 12-lead ECG - not done; with chronic V pacing will be non-diagnostic (pacer dependent) Device interrogation today - Normal device function. Thresholds, sensing, impedances consistent with previous measurements. Device programmed to maximize longevity. 2 mode switch episodes, longest 10 seconds, no EGM. Device programmed at appropriate safety margins. Histogram distribution appropriate for patient activity level. Device programmed to optimize intrinsic conduction. Estimated longevity 2.75 - 4.0 years.  Assessment and Plan 1. Left arm and jaw pain 2. Complete heart block s/p PPM implantation 3. CAD s/p cutting balloon  angioplasty to mid LAD (80%>>10%) in 2007 (negative adenosine Myoview 2010, LVEF 64%) 4. DM 5. HTN With known CAD and multiple cardiac risk factors, [facility] further evaluation of arm and jaw pain. Normal PPM function. Pacemaker dependent. No programming changes made. Continue routine remote PPM follow-up every 3 months. Return for follow-up with Dr. Graciela HusbandsKlein as scheduled in June 2015.    Jorja LoaSigned, Arianny Pun O Joya Willmott, PA-C 04/29/2013, 6:57 PM

## 2013-04-29 NOTE — Patient Instructions (Signed)
Your physician has requested that you have a lexiscan myoview. For further information please visit [email or web address]. Please follow instruction sheet, as given.  Your physician recommends that you continue on your current medications as directed. Please refer to the Current Medication list given to you today.   Your physician wants you to follow-up in: 6 months with Logan BoresKlein You will receive a reminder letter in the mail two months in advance. If you don't receive a letter, please call our office to schedule the follow-up appointment.

## 2013-05-07 ENCOUNTER — Encounter: Payer: Self-pay | Admitting: Internal Medicine

## 2013-05-12 ENCOUNTER — Encounter: Payer: Self-pay | Admitting: Cardiology

## 2013-05-13 ENCOUNTER — Encounter (HOSPITAL_COMMUNITY): Payer: Medicare Other

## 2013-05-16 ENCOUNTER — Encounter: Payer: Self-pay | Admitting: Internal Medicine

## 2013-05-27 ENCOUNTER — Encounter (HOSPITAL_COMMUNITY): Payer: Medicare Other

## 2013-06-25 ENCOUNTER — Ambulatory Visit (INDEPENDENT_AMBULATORY_CARE_PROVIDER_SITE_OTHER): Payer: Medicare Other | Admitting: Internal Medicine

## 2013-06-25 ENCOUNTER — Encounter: Payer: Self-pay | Admitting: Internal Medicine

## 2013-06-25 VITALS — BP 184/90 | HR 80 | Ht 63.0 in | Wt 195.0 lb

## 2013-06-25 DIAGNOSIS — I442 Atrioventricular block, complete: Secondary | ICD-10-CM

## 2013-06-25 DIAGNOSIS — Z950 Presence of cardiac pacemaker: Secondary | ICD-10-CM

## 2013-06-25 LAB — MDC_IDC_ENUM_SESS_TYPE_INCLINIC
Brady Statistic RV Percent Paced: 99 %
Implantable Pulse Generator Model: 5816
Implantable Pulse Generator Serial Number: 1643955
Lead Channel Pacing Threshold Pulse Width: 0.5 ms
MDC IDC MSMT LEADCHNL RA PACING THRESHOLD AMPLITUDE: 0.75 V
MDC IDC MSMT LEADCHNL RA SENSING INTR AMPL: 0.7 mV
MDC IDC MSMT LEADCHNL RV PACING THRESHOLD AMPLITUDE: 1.125 V
MDC IDC MSMT LEADCHNL RV PACING THRESHOLD PULSEWIDTH: 0.5 ms
MDC IDC STAT BRADY RA PERCENT PACED: 7 %

## 2013-06-25 LAB — BASIC METABOLIC PANEL
BUN: 16 mg/dL (ref 6–23)
CALCIUM: 10.7 mg/dL — AB (ref 8.4–10.5)
CO2: 27 meq/L (ref 19–32)
Chloride: 105 mEq/L (ref 96–112)
Creatinine, Ser: 0.6 mg/dL (ref 0.4–1.2)
GFR: 97.4 mL/min (ref >60.00)
GLUCOSE: 166 mg/dL — AB (ref 70–99)
Potassium: 3.8 mEq/L (ref 3.5–5.1)
SODIUM: 140 meq/L (ref 135–145)

## 2013-06-25 MED ORDER — CARVEDILOL 25 MG PO TABS: 25.0000 mg | ORAL_TABLET | Freq: Two times a day (BID) | ORAL | Status: DC

## 2013-06-25 NOTE — Patient Instructions (Addendum)
Your physician has recommended you make the following change in your medication:  1) INCREASE Carvedilol to 25 mg twice daily  Lab today: BMET  Your physician wants you to follow-up in: 6 months with Dr. Graciela HusbandsKlein.  You will receive a reminder letter in the mail two months in advance. If you don't receive a letter, please call our office to schedule the follow-up appointment.

## 2013-06-25 NOTE — Progress Notes (Signed)
Patient Care Team: Joette CatchingLeonard Nyland, MD as PCP - General (Family Medicine) Duke SalviaSteven C Klein, MD (Cardiology)   HPI  Brittany NationsMarlyn Jaquita RectorM Hack is a 80 y.o. female is seen in followup for complete heart block she is status post pacemaker implantation  she is having no problem related to this.  She has a history of prior LAD POBA with normal left ventricular function.   The patient denies chest pain, shortness of breath, nocturnal dyspnea, orthopnea or peripheral edema.  There have been no palpitations, lightheadedness or syncope.   Her blood pressure at home and 175 or so  Past Medical History  Diagnosis Date  . CAD (coronary artery disease)   . Pacemaker   . AV block   . Hyperlipidemia   . Hypertension   . Diabetes mellitus     Type II  . Glaucoma     Past Surgical History  Procedure Laterality Date  . Tonsillectomy  1937  . Breast biopsy  1991  . Left total knee replacement  1996  . Pacemaker insertion      St. Jude  . Femur im nail  01/18/2012    Procedure: INTRAMEDULLARY (IM) NAIL FEMORAL;  Surgeon: Senaida LangeKevin M Supple, MD;  Location: [facility] OR;  Service: Orthopedics;  Laterality: Left;    Current Outpatient Prescriptions  Medication Sig Dispense Refill  . aspirin EC 81 MG tablet Take 81 mg by mouth at bedtime.      . carvedilol (COREG) 12.5 MG tablet Take 1 tablet (12.5 mg total) by mouth 2 (two) times daily with a meal.  60 tablet  4  . Cholecalciferol (VITAMIN D PO) Take 2 tablets by mouth daily.      . Cinnamon 500 MG capsule Take 1,000 mg by mouth 2 (two) times daily.      . Coenzyme Q10 (CO Q 10) 100 MG CAPS Take 100 mg by mouth daily. Hold while in hospital      . Cyanocobalamin (VITAMIN B-12 PO) Take 1 tablet by mouth daily.      Marland Kitchen. DOCUSATE SODIUM PO Take by mouth as needed.      . metFORMIN (GLUCOPHAGE-XR) 500 MG 24 hr tablet Take 1,000 mg by mouth 2 (two) times daily.      . Multiple Vitamin (MULTIVITAMIN WITH MINERALS) TABS Take 1 tablet by mouth daily.      Marland Kitchen. OVER THE  COUNTER MEDICATION Take 1 tablet by mouth daily. Mega Red Extra Strength.      . valsartan-hydrochlorothiazide (DIOVAN-HCT) 160-12.5 MG per tablet Take 1 tablet by mouth daily.      Marland Kitchen. VITAMIN E PO Take 1 tablet by mouth daily.       No current facility-administered medications for this visit.    Allergies  Allergen Reactions  . Ace Inhibitors     REACTION: cough and sore throat  . Atorvastatin     REACTION: Myalgia  . Ciprofloxacin Other (See Comments)    REACTION:  unknown  . Crestor [Rosuvastatin] Other (See Comments)    REACTION:  unknown  . Plavix [Clopidogrel Bisulfate] Other (See Comments)    REACTION:  unknown    Review of Systems negative except from HPI and PMH  Physical Exam BP 184/90  Pulse 80  Ht 5\' 3"  (1.6 m)  Wt 195 lb (88.451 kg)  BMI 34.55 kg/m2 Well developed and well nourished in no acute distress HENT normal E scleral and icterus clear Neck Supple JVP flat; carotids brisk and full Clear to  ausculation Device pocket well healed; without hematoma or erythema.  There is no tethering Regular rate and rhythm, no murmurs gallops or rub Soft with active bowel sounds No clubbing cyanosis  Edema Alert and oriented, grossly normal motor and sensory function Skin Warm and Dry  ECG demonstrates P. synchronous pacing  Assessment and  Plan  Hypertension  Complete heart block  Pacemaker-St. Jude The patient's device was interrogated.  The information was reviewed. No changes were made in the programming.    Coronary disease with prior LAD POBA  Diabetes mellitus with neuropathy  Blood pressure is poorly controlled. She says at home is in the 175 range. We will increase her carvedilol from 12-1/2--25 mg twice daily.  Without symptoms of ischemia  Heart block is stable

## 2013-06-30 ENCOUNTER — Other Ambulatory Visit: Payer: Self-pay | Admitting: Internal Medicine

## 2013-07-02 ENCOUNTER — Encounter: Payer: Self-pay | Admitting: Internal Medicine

## 2013-07-21 DIAGNOSIS — E559 Vitamin D deficiency, unspecified: Secondary | ICD-10-CM | POA: Insufficient documentation

## 2013-07-30 DIAGNOSIS — I442 Atrioventricular block, complete: Secondary | ICD-10-CM

## 2013-09-08 ENCOUNTER — Encounter: Payer: Self-pay | Admitting: Internal Medicine

## 2013-10-15 ENCOUNTER — Encounter (HOSPITAL_COMMUNITY): Payer: Self-pay | Admitting: Emergency Medicine

## 2013-10-15 ENCOUNTER — Emergency Department (HOSPITAL_COMMUNITY)
Admission: EM | Admit: 2013-10-15 | Discharge: 2013-10-16 | Disposition: A | Discharge status: Home / Self Care | Payer: Medicare Other | Attending: Emergency Medicine | Admitting: Emergency Medicine

## 2013-10-15 ENCOUNTER — Emergency Department (HOSPITAL_COMMUNITY): Payer: Medicare Other

## 2013-10-15 DIAGNOSIS — Z7982 Long term (current) use of aspirin: Secondary | ICD-10-CM | POA: Diagnosis not present

## 2013-10-15 DIAGNOSIS — E119 Type 2 diabetes mellitus without complications: Secondary | ICD-10-CM | POA: Diagnosis not present

## 2013-10-15 DIAGNOSIS — M25512 Pain in left shoulder: Secondary | ICD-10-CM | POA: Diagnosis not present

## 2013-10-15 DIAGNOSIS — I2510 Atherosclerotic heart disease of native coronary artery without angina pectoris: Secondary | ICD-10-CM | POA: Diagnosis not present

## 2013-10-15 DIAGNOSIS — M542 Cervicalgia: Secondary | ICD-10-CM | POA: Insufficient documentation

## 2013-10-15 DIAGNOSIS — I10 Essential (primary) hypertension: Secondary | ICD-10-CM | POA: Diagnosis not present

## 2013-10-15 DIAGNOSIS — Z79899 Other long term (current) drug therapy: Secondary | ICD-10-CM | POA: Diagnosis not present

## 2013-10-15 DIAGNOSIS — R0602 Shortness of breath: Secondary | ICD-10-CM | POA: Diagnosis not present

## 2013-10-15 DIAGNOSIS — Z950 Presence of cardiac pacemaker: Secondary | ICD-10-CM | POA: Insufficient documentation

## 2013-10-15 DIAGNOSIS — Z87891 Personal history of nicotine dependence: Secondary | ICD-10-CM | POA: Diagnosis not present

## 2013-10-15 DIAGNOSIS — R079 Chest pain, unspecified: Secondary | ICD-10-CM | POA: Insufficient documentation

## 2013-10-15 DIAGNOSIS — Z8669 Personal history of other diseases of the nervous system and sense organs: Secondary | ICD-10-CM | POA: Insufficient documentation

## 2013-10-15 LAB — I-STAT TROPONIN, ED: Troponin i, poc: 0.01 ng/mL (ref 0.00–0.08)

## 2013-10-15 LAB — CBC
HEMATOCRIT: 40.8 % (ref 36.0–46.0)
Hemoglobin: 14.2 g/dL (ref 12.0–15.0)
MCH: 29.6 pg (ref 26.0–34.0)
MCHC: 34.8 g/dL (ref 30.0–36.0)
MCV: 85 fL (ref 78.0–100.0)
PLATELETS: 208 10*3/uL (ref 150–400)
RBC: 4.8 MIL/uL (ref 3.87–5.11)
RDW: 13.3 % (ref 11.5–15.5)
WBC: 5.6 10*3/uL (ref 4.0–10.5)

## 2013-10-15 LAB — BASIC METABOLIC PANEL
Anion gap: 12 (ref 5–15)
BUN: 13 mg/dL (ref 6–23)
CHLORIDE: 101 meq/L (ref 96–112)
CO2: 26 mEq/L (ref 19–32)
Calcium: 10.5 mg/dL (ref 8.4–10.5)
Creatinine, Ser: 0.54 mg/dL (ref 0.50–1.10)
GFR calc non Af Amer: 84 mL/min — ABNORMAL LOW (ref >90)
GLUCOSE: 188 mg/dL — AB (ref 70–99)
POTASSIUM: 4 meq/L (ref 3.7–5.3)
Sodium: 139 mEq/L (ref 137–147)

## 2013-10-15 MED ORDER — ACETAMINOPHEN-CODEINE #3 300-30 MG PO TABS: 1.0000 | ORAL_TABLET | Freq: Four times a day (QID) | ORAL | Status: DC | PRN

## 2013-10-15 MED ORDER — ACETAMINOPHEN 500 MG PO TABS: 500.0000 mg | ORAL_TABLET | Freq: Four times a day (QID) | ORAL | Status: DC | PRN

## 2013-10-15 NOTE — ED Notes (Signed)
[employer] at bedside.

## 2013-10-15 NOTE — Discharge Instructions (Signed)
Please read and follow all provided instructions.  Your diagnoses today include:  1. Left shoulder pain     Tests performed today include:  Vital signs. See below for your results today.     Chest x-ray - normal  Blood test for heart attack - normal  EKG - unchanged  Interrogation of your pacemaker to ensure that it is working properly  Medications prescribed:   Tylenol - medication for shoulder pain  Take any prescribed medications only as directed.  Home care instructions:  Follow any educational materials contained in this packet.  BE VERY CAREFUL not to take multiple medicines containing Tylenol (also called acetaminophen). Doing so can lead to an overdose which can damage your liver and cause liver failure and possibly death.   Follow-up instructions: Please follow-up with your primary care provider in the next 3 days for further evaluation of your symptoms.   Return instructions:   Please return to the Emergency Department if you experience worsening symptoms.   Return with worsening chest pain, shortness of breath, nausea or vomiting.  Please return if you have any other emergent concerns.  Additional Information:  Your vital signs today were: BP 147/47   Pulse 73   Temp(Src) 98.1 F (36.7 C) (Oral)   Resp 17   SpO2 98% If your blood pressure (BP) was elevated above 135/85 this visit, please have this repeated by your doctor within one month. --------------

## 2013-10-15 NOTE — ED Notes (Signed)
Spoke with Arlys JohnBrian with [employer]. Will be down to interrogate.

## 2013-10-15 NOTE — ED Provider Notes (Signed)
CSN: [id number]     Arrival date & time 10/15/13  1100 History   First MD Initiated Contact with Patient 10/15/13 1100     Chief Complaint  Patient presents with  . Pacemaker Problem     (Consider location/radiation/quality/duration/timing/severity/associated sxs/prior Treatment) HPI Comments: Patient with history of diabetes, complete heart block status post St. Jude pacemaker placement -- presents with complaint of left upper chest and neck pain which began last evening. Patient states that she went to bed expecting things to feel better in the morning but they were not. Shoulder pain is worse with palpation and movement. She has had some mild shortness of breath. No nausea or palpitations. Patient states that she checked her blood pressure and pulse at home and the machine said to call her doctor. Patient denies abdominal pain. Onset of symptoms acute. Course is constant. Nothing makes symptoms better or worse.  The history is provided by the patient and medical records.    Past Medical History  Diagnosis Date  . CAD (coronary artery disease)   . Pacemaker   . AV block   . Hyperlipidemia   . Hypertension   . Diabetes mellitus     Type II  . Glaucoma    Past Surgical History  Procedure Laterality Date  . Tonsillectomy  1937  . Breast biopsy  1991  . Left total knee replacement  1996  . Pacemaker insertion      St. Jude  . Femur im nail  01/18/2012    Procedure: INTRAMEDULLARY (IM) NAIL FEMORAL;  Surgeon: Senaida LangeKevin M Supple, MD;  Location: [facility] OR;  Service: Orthopedics;  Laterality: Left;   Family History  Problem Relation Age of Onset  . Cancer    . Coronary artery disease    . Stroke    . Diabetes    . Hypertension     History  Substance Use Topics  . Smoking status: Former Smoker    Types: Cigarettes    Quit date: 01/17/1951  . Smokeless tobacco: Never Used  . Alcohol Use: Yes   OB History   Grav Para Term Preterm Abortions TAB SAB Ect Mult Living                  Review of Systems  Constitutional: Negative for fever and diaphoresis.  HENT: Negative for rhinorrhea and sore throat.   Eyes: Negative for redness.  Respiratory: Positive for shortness of breath. Negative for cough.   Cardiovascular: Negative for chest pain, palpitations and leg swelling.  Gastrointestinal: Negative for nausea, vomiting, abdominal pain and diarrhea.  Genitourinary: Negative for dysuria.  Musculoskeletal: Positive for neck pain. Negative for back pain and myalgias.  Skin: Negative for rash.  Neurological: Negative for syncope, light-headedness and headaches.    Allergies  Ace inhibitors; Atorvastatin; Ciprofloxacin; Crestor; and Plavix  Home Medications   Prior to Admission medications   Medication Sig Start Date End Date Taking? Authorizing Provider  aspirin EC 81 MG tablet Take 81 mg by mouth at bedtime.    Historical Provider, MD  carvedilol (COREG) 25 MG tablet Take 1 tablet (25 mg total) by mouth 2 (two) times daily with a meal. 06/25/13   Duke SalviaSteven C Klein, MD  Cholecalciferol (VITAMIN D PO) Take 2 tablets by mouth daily.    Historical Provider, MD  Cinnamon 500 MG capsule Take 1,000 mg by mouth 2 (two) times daily.    Historical Provider, MD  Coenzyme Q10 (CO Q 10) 100 MG CAPS Take 100 mg by  mouth daily. Hold while in hospital    Historical Provider, MD  Cyanocobalamin (VITAMIN B-12 PO) Take 1 tablet by mouth daily.    Historical Provider, MD  DOCUSATE SODIUM PO Take by mouth as needed.    Historical Provider, MD  metFORMIN (GLUCOPHAGE-XR) 500 MG 24 hr tablet Take 1,000 mg by mouth 2 (two) times daily.    Historical Provider, MD  Multiple Vitamin (MULTIVITAMIN WITH MINERALS) TABS Take 1 tablet by mouth daily.    Historical Provider, MD  OVER THE COUNTER MEDICATION Take 1 tablet by mouth daily. Mega Red Extra Strength.    Historical Provider, MD  valsartan-hydrochlorothiazide (DIOVAN-HCT) 160-12.5 MG per tablet Take 1 tablet by mouth daily.    Historical Provider,  MD  VITAMIN E PO Take 1 tablet by mouth daily.    Historical Provider, MD   BP 159/72  Pulse 64  Temp(Src) 98.1 F (36.7 C) (Oral)  Resp 19  SpO2 96%  Physical Exam  Nursing note and vitals reviewed. Constitutional: She appears well-developed and well-nourished.  HENT:  Head: Normocephalic and atraumatic.  Eyes: Conjunctivae are normal. Right eye exhibits no discharge. Left eye exhibits no discharge.  Neck: Normal range of motion. Neck supple.  Cardiovascular: Normal rate, regular rhythm and normal heart sounds.   Pulmonary/Chest: Effort normal and breath sounds normal. No respiratory distress. She has no wheezes. She has no rales. She exhibits tenderness (mild, around pacer pocket, no signs of infection).  Abdominal: Soft. There is no tenderness. There is no rebound and no guarding.  Musculoskeletal:       Left shoulder: She exhibits tenderness and bony tenderness. She exhibits normal range of motion, no deformity and no laceration.       Cervical back: She exhibits normal range of motion, no tenderness and no bony tenderness.       Thoracic back: She exhibits normal range of motion, no tenderness and no bony tenderness.  Neurological: She is alert.  Skin: Skin is warm and dry.  Psychiatric: She has a normal mood and affect.    ED Course  Procedures (including critical care time) Labs Review Labs Reviewed  BASIC METABOLIC PANEL - Abnormal; Notable for the following:    Glucose, Bld 188 (*)    GFR calc non Af Amer 84 (*)    All other components within normal limits  CBC  I-STAT TROPOININ, ED    Imaging Review Dg Chest 2 View  10/15/2013   CLINICAL DATA:  Left shoulder pain with ST elevation  EXAM: CHEST  2 VIEW  COMPARISON:  06/11/2013  FINDINGS: There is no focal parenchymal opacity, pleural effusion, or pneumothorax. The heart and mediastinal contours are unremarkable. There is a dual lead AICD.  The osseous structures are unremarkable.  IMPRESSION: No active  cardiopulmonary disease.   Electronically Signed   By: Elige Ko   On: 10/15/2013 13:24     EKG Interpretation   Date/Time:  Wednesday October 15 2013 11:04:36 EDT Ventricular Rate:  67 PR Interval:  233 QRS Duration: 154 QT Interval:  439 QTC Calculation: 463 R Axis:   -71 Text Interpretation:  Sinus rhythm Prolonged PR interval Nonspecific IVCD  with LAD Left ventricular hypertrophy Inferior infarct, acute (RCA)  Anterolateral infarct, old Probable RV involvement, suggest recording  right precordial leads Simlar to prior paced rhythm, minor T wave changes  in inferior lead Confirmed by Gwendolyn Grant  MD, BLAIR ([id number]) on 10/15/2013  11:14:52 AM      11:16 AM Patient seen and  examined. D/w Dr. Gwendolyn Grant. Work-up initiated. Will have pacer interrogated. EKG reviewed.   Vital signs reviewed and are as follows: BP 159/72  Pulse 64  Temp(Src) 98.1 F (36.7 C) (Oral)  Resp 19  SpO2 96%  2:37 PM Pacemaker interrogation completed without any abnormalities.   Pt informed of her results. She continues to be anxious that there is something wrong with her pacemaker. She is very upset that Dr. Graciela Husbands has not come to the bedside to see her. I attempted to reassure her that we would check the things that Dr. Graciela Husbands would and that the cardiologist does not always need to see the patient in the ED when everything checks out. She states that she wants to go home. Will discharge.    MDM   Final diagnoses:  Left shoulder pain   Patient with L shoulder and neck pain since last night. She is very anxious. She is concerned that her pacemaker is not working appropriately, however no problems noted on interrogation today. Troponin is negative. EKG is not concerning (paced). No lightheadedness or syncope. Shoulder pain is reproducible and worse with movement, suspect MSK pain and doubt anginal equivalent. Feel patient is safe for discharge to home and f/u with PCP/Dr. Graciela Husbands.     Renne Crigler,  PA-C 10/15/13 1527

## 2013-10-15 NOTE — ED Notes (Signed)
[facility]- pt from home, went to read BP this am and pulse rate unable to read and said call doctor. CBG of 204, pulse in the 60's. Some ST elevation shown in 4 lead with EMS. Pt c/o some left shoulder pain. Denies shortness of breath or chest pain.

## 2013-10-15 NOTE — ED Notes (Addendum)
Dr. Walden at bedside 

## 2013-10-15 NOTE — ED Notes (Signed)
Attempting to interrogate pacemaker

## 2013-10-16 NOTE — ED Provider Notes (Signed)
Medical screening examination/treatment/procedure(s) were conducted as a shared visit with non-physician practitioner(s) and myself.  I personally evaluated the patient during the encounter.   EKG Interpretation   Date/Time:  Wednesday October 15 2013 11:04:36 EDT Ventricular Rate:  67 PR Interval:  233 QRS Duration: 154 QT Interval:  439 QTC Calculation: 463 R Axis:   -71 Text Interpretation:  Sinus rhythm Prolonged PR interval Nonspecific IVCD  with LAD Left ventricular hypertrophy Inferior infarct, acute (RCA)  Anterolateral infarct, old Probable RV involvement, suggest recording  right precordial leads Simlar to prior paced rhythm, minor T wave changes  in inferior lead Confirmed by Gwendolyn GrantWALDEN  MD, Zariyah Stephens ([id number]) on 10/15/2013  11:14:52 AM      Patient here with chest pain. Her BP machine at home stated for her to call her doctor because it couldn't pick up her pulse. Denies syncope, SOB, fever. Exam benign. Pacemaker interrogation normal. Likely patient's BP machine faulty. Workup negative. Stable for discharge.  Elwin MochaBlair Dezerae Freiberger, MD 10/16/13 1540

## 2013-10-30 ENCOUNTER — Encounter: Payer: Self-pay | Admitting: Internal Medicine

## 2013-10-30 DIAGNOSIS — I442 Atrioventricular block, complete: Secondary | ICD-10-CM

## 2014-01-01 ENCOUNTER — Encounter: Payer: Self-pay | Admitting: Internal Medicine

## 2014-01-01 ENCOUNTER — Ambulatory Visit (INDEPENDENT_AMBULATORY_CARE_PROVIDER_SITE_OTHER): Payer: Medicare Other | Admitting: Internal Medicine

## 2014-01-01 VITALS — BP 160/74 | HR 72 | Wt 185.4 lb

## 2014-01-01 DIAGNOSIS — T82110A Breakdown (mechanical) of cardiac electrode, initial encounter: Secondary | ICD-10-CM

## 2014-01-01 DIAGNOSIS — I2510 Atherosclerotic heart disease of native coronary artery without angina pectoris: Secondary | ICD-10-CM

## 2014-01-01 DIAGNOSIS — I10 Essential (primary) hypertension: Secondary | ICD-10-CM

## 2014-01-01 DIAGNOSIS — I442 Atrioventricular block, complete: Secondary | ICD-10-CM

## 2014-01-01 LAB — MDC_IDC_ENUM_SESS_TYPE_INCLINIC
Battery Voltage: 2.74 V
Date Time Interrogation Session: 20151224115409
Implantable Pulse Generator Model: 5816
Implantable Pulse Generator Serial Number: 1643955
Lead Channel Impedance Value: 385 Ohm
Lead Channel Impedance Value: 926 Ohm
Lead Channel Pacing Threshold Amplitude: 1.25 V
Lead Channel Pacing Threshold Pulse Width: 0.5 ms
Lead Channel Pacing Threshold Pulse Width: 0.5 ms
Lead Channel Sensing Intrinsic Amplitude: 0.3 mV
Lead Channel Setting Pacing Amplitude: 2 V
Lead Channel Setting Sensing Sensitivity: 2 mV
MDC IDC MSMT BATTERY IMPEDANCE: 4200 Ohm
MDC IDC MSMT LEADCHNL RA PACING THRESHOLD AMPLITUDE: 1 V
MDC IDC SET LEADCHNL RV PACING PULSEWIDTH: 0.5 ms

## 2014-01-01 NOTE — Progress Notes (Signed)
Patient Care Team: Joette CatchingLeonard Nyland, MD as PCP - General (Family Medicine) Duke SalviaSteven C Kyrstan Gotwalt, MD (Cardiology)   HPI  Brittany [name] is a 80 y.o. female is seen in followup for complete heart block she is status post pacemaker implantation  she is having no problem related to this.  She has a history of prior LAD POBA with normal left ventricular function.   The patient denies chest pain, shortness of breath, nocturnal dyspnea, orthopnea or peripheral edema.  There have been no palpitations, lightheadedness or syncope.   Her BP at home have been much improved 120s        Past Medical History  Diagnosis Date  . CAD (coronary artery disease)   . Pacemaker   . AV block   . Hyperlipidemia   . Hypertension   . Diabetes mellitus     Type II  . Glaucoma     Past Surgical History  Procedure Laterality Date  . Tonsillectomy  1937  . Breast biopsy  1991  . Left total knee replacement  1996  . Pacemaker insertion      [employer] Jude  . Femur im nail  01/18/2012    Procedure: INTRAMEDULLARY (IM) NAIL FEMORAL;  Surgeon: Senaida LangeKevin M Supple, MD;  Location: [facility] OR;  Service: Orthopedics;  Laterality: Left;    Current Outpatient Prescriptions  Medication Sig Dispense Refill  . aspirin 81 MG tablet Take 81 mg by mouth at bedtime.    Marland Kitchen. b complex vitamins tablet Take 1 tablet by mouth daily.    . carvedilol (COREG) 12.5 MG tablet Take 25 mg by mouth 2 (two) times daily with a meal.     . Cholecalciferol 1000 UNITS tablet Take 5,000 Units by mouth daily.     . Coenzyme Q10 (EQL COQ10) 300 MG CAPS Take 200 mg by mouth daily.     . DHA-EPA-Vitamin E (OMEGA-3 COMPLEX PO) Take by mouth.    Marland Kitchen. KRILL OIL PO Take 353 mg by mouth daily.    . metFORMIN (GLUCOPHAGE) 500 MG tablet Take 500 mg by mouth 2 (two) times daily with a meal.    . valsartan-hydrochlorothiazide (DIOVAN-HCT) 160-12.5 MG per tablet Take 1 tablet by mouth daily.    . vitamin B-12 (CYANOCOBALAMIN) 1000 MCG tablet Take 1,000 mcg by  mouth daily.    . vitamin C (ASCORBIC ACID) 500 MG tablet Take 500 mg by mouth daily.    . vitamin E (VITAMIN E) 400 UNIT capsule Take 1,000 Units by mouth daily.      No current facility-administered medications for this visit.    Allergies  Allergen Reactions  . Ciprofloxacin Other (See Comments)    Severe chest pains  . Atorvastatin Other (See Comments)    REACTION: Myalgia  . Cedax [Ceftibuten] Other (See Comments)    Possibly nausea/vomiting, cyanosis, severe headaches  . Plavix [Clopidogrel Bisulfate] Other (See Comments)    Possibly nausea/vomiting, cyanosis, severe headaches    . Statins Other (See Comments)    Myalgias, cyanosis  . Ace Inhibitors Other (See Comments)    REACTION: cough and sore throat    Review of Systems negative except from HPI and PMH  Physical Exam BP 160/74 mmHg  Pulse 72  SpO2 97% Well developed and well nourished in no acute distress HENT normal E scleral and icterus clear Neck Supple JVP flat; carotids brisk and full Clear to ausculation Device pocket well healed; without hematoma or erythema.  There is no tethering Regular  rate and rhythm, no murmurs gallops or rub Soft with active bowel sounds No clubbing cyanosis  Edema Alert and oriented, grossly normal motor and sensory function Skin Warm and Dry  ECG demonstrates P. synchronous pacing  Assessment and  Plan  Hypertension much improved  Complete heart block  Pacemaker-[employer] Jude The patient's device was interrogated.  The information was reviewed. No changes were made in the programming.    Coronary disease with prior LAD POBA Without symptoms of ischemia      Blood pressure is much better She will continue on carvedilol 25 bid    Heart block is stable  Atrial lead sensitivity is continuing to be probematic

## 2014-01-01 NOTE — Patient Instructions (Addendum)
Your physician recommends that you schedule a follow-up appointment in: 6 months with the device clinic. Our office will call you with an appointment Your physician wants you to follow-up in: 1 year with Dr. Graciela HusbandsKlein.  You will receive a reminder letter in the mail two months in advance. If you don't receive a letter, please call our office to schedule the follow-up appointment.  Your physician recommends that you continue on your current medications as directed. Please refer to the Current Medication list given to you today.

## 2014-01-29 ENCOUNTER — Encounter: Payer: Self-pay | Admitting: Internal Medicine

## 2014-01-29 DIAGNOSIS — I442 Atrioventricular block, complete: Secondary | ICD-10-CM

## 2014-02-03 ENCOUNTER — Telehealth: Payer: Self-pay | Admitting: Cardiology

## 2014-02-03 NOTE — Telephone Encounter (Signed)
Oversensing alert being faxed from [facility].

## 2014-04-30 ENCOUNTER — Encounter: Payer: Self-pay | Admitting: Internal Medicine

## 2014-04-30 DIAGNOSIS — I442 Atrioventricular block, complete: Secondary | ICD-10-CM | POA: Diagnosis not present

## 2014-07-01 ENCOUNTER — Ambulatory Visit (INDEPENDENT_AMBULATORY_CARE_PROVIDER_SITE_OTHER): Payer: Medicare Other | Admitting: *Deleted

## 2014-07-01 ENCOUNTER — Encounter: Payer: Self-pay | Admitting: Internal Medicine

## 2014-07-01 DIAGNOSIS — Z950 Presence of cardiac pacemaker: Secondary | ICD-10-CM

## 2014-07-01 DIAGNOSIS — I442 Atrioventricular block, complete: Secondary | ICD-10-CM | POA: Diagnosis not present

## 2014-07-01 LAB — CUP PACEART INCLINIC DEVICE CHECK
Battery Voltage: 2.72 V
Brady Statistic RA Percent Paced: 1 % — CL
Brady Statistic RV Percent Paced: 99 %
Date Time Interrogation Session: 20160622122452
Lead Channel Pacing Threshold Amplitude: 1 V
Lead Channel Setting Pacing Amplitude: 2 V
Lead Channel Setting Pacing Pulse Width: 0.5 ms
Lead Channel Setting Sensing Sensitivity: 2 mV
MDC IDC MSMT BATTERY IMPEDANCE: 6500 Ohm
MDC IDC MSMT LEADCHNL RA IMPEDANCE VALUE: 395 Ohm
MDC IDC MSMT LEADCHNL RA PACING THRESHOLD PULSEWIDTH: 0.5 ms
MDC IDC MSMT LEADCHNL RV IMPEDANCE VALUE: 979 Ohm
Pulse Gen Model: 5816
Pulse Gen Serial Number: 1643955

## 2014-07-01 NOTE — Progress Notes (Signed)
Pacemaker check in clinic. Normal device function. Thresholds, sensing, impedances consistent with previous measurements. Device programmed to maximize longevity. 1 mode switch---4sec, no EGM. Device programmed at appropriate safety margins. Histogram distribution appropriate for patient activity level. Device programmed to optimize intrinsic conduction. Estimated longevity 1.25-1.56yrs. ROV w/ SK in 50mo.

## 2014-07-30 DIAGNOSIS — I442 Atrioventricular block, complete: Secondary | ICD-10-CM | POA: Diagnosis not present

## 2014-08-07 ENCOUNTER — Other Ambulatory Visit: Payer: Self-pay | Admitting: Internal Medicine

## 2014-10-29 DIAGNOSIS — I442 Atrioventricular block, complete: Secondary | ICD-10-CM | POA: Diagnosis not present

## 2014-11-09 ENCOUNTER — Telehealth: Payer: Self-pay | Admitting: Internal Medicine

## 2014-11-09 ENCOUNTER — Telehealth: Payer: Self-pay | Admitting: *Deleted

## 2014-11-09 NOTE — Telephone Encounter (Signed)
Returned call to patient.She stated she feels like her pace maker battery is getting low.Stated she is dizzy at times.Lower back achy.Hard to get a good breath at times.Advised I will send message to device clinic to check her pacemaker.

## 2014-11-09 NOTE — Telephone Encounter (Signed)
Returned patient's call.  She states that she does not think that her feeling "achy" or her occasional dizziness is device-related, but that she just wanted to ensure that she hasn't reached ERI.  Charles, Chief Operating Officerdevice tech, reviewed electronic TTM report[facility]  Advised patient that we would be happy to see her in the device clinic for an appointment to check her device function, but patient states she thinks that her symptoms are related to "old-age".  She states that at this time, she does not want to schedule an appointment because her daughter has to bring her and she already has an appointment scheduled with Dr. Graciela HusbandsKlein on 01/18/15.  Patient is aware to call with worsening symptoms and states that if she does feel worse, she will call for an earlier appointment.  She denies any additional questions or concerns at this time and is appreciative of call.
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
## 2014-11-09 NOTE — Telephone Encounter (Signed)
New message  Pt called states that she believes that it is time for a new pacer. She says that it is a bit "achy" no other symptoms she just wanted to report how she was feeling. Please call.

## 2015-01-02 ENCOUNTER — Inpatient Hospital Stay (HOSPITAL_COMMUNITY)
Admission: EM | Admit: 2015-01-02 | Discharge: 2015-01-05 | DRG: 292 | Disposition: A | Discharge status: Home / Self Care | Payer: Medicare Other | Attending: Internal Medicine | Admitting: Internal Medicine

## 2015-01-02 ENCOUNTER — Encounter (HOSPITAL_COMMUNITY): Payer: Self-pay | Admitting: Emergency Medicine

## 2015-01-02 ENCOUNTER — Emergency Department (HOSPITAL_COMMUNITY): Payer: Medicare Other

## 2015-01-02 DIAGNOSIS — I5032 Chronic diastolic (congestive) heart failure: Secondary | ICD-10-CM | POA: Diagnosis present

## 2015-01-02 DIAGNOSIS — Z8249 Family history of ischemic heart disease and other diseases of the circulatory system: Secondary | ICD-10-CM

## 2015-01-02 DIAGNOSIS — Z823 Family history of stroke: Secondary | ICD-10-CM

## 2015-01-02 DIAGNOSIS — Z950 Presence of cardiac pacemaker: Secondary | ICD-10-CM

## 2015-01-02 DIAGNOSIS — Z96652 Presence of left artificial knee joint: Secondary | ICD-10-CM | POA: Diagnosis present

## 2015-01-02 DIAGNOSIS — I509 Heart failure, unspecified: Secondary | ICD-10-CM | POA: Insufficient documentation

## 2015-01-02 DIAGNOSIS — H409 Unspecified glaucoma: Secondary | ICD-10-CM | POA: Diagnosis present

## 2015-01-02 DIAGNOSIS — Z833 Family history of diabetes mellitus: Secondary | ICD-10-CM

## 2015-01-02 DIAGNOSIS — Z7982 Long term (current) use of aspirin: Secondary | ICD-10-CM | POA: Diagnosis not present

## 2015-01-02 DIAGNOSIS — I2510 Atherosclerotic heart disease of native coronary artery without angina pectoris: Secondary | ICD-10-CM | POA: Diagnosis present

## 2015-01-02 DIAGNOSIS — Z6833 Body mass index (BMI) 33.0-33.9, adult: Secondary | ICD-10-CM | POA: Diagnosis not present

## 2015-01-02 DIAGNOSIS — E785 Hyperlipidemia, unspecified: Secondary | ICD-10-CM | POA: Diagnosis present

## 2015-01-02 DIAGNOSIS — Z87891 Personal history of nicotine dependence: Secondary | ICD-10-CM | POA: Diagnosis not present

## 2015-01-02 DIAGNOSIS — I110 Hypertensive heart disease with heart failure: Principal | ICD-10-CM | POA: Diagnosis present

## 2015-01-02 DIAGNOSIS — I442 Atrioventricular block, complete: Secondary | ICD-10-CM | POA: Diagnosis present

## 2015-01-02 DIAGNOSIS — E669 Obesity, unspecified: Secondary | ICD-10-CM | POA: Diagnosis present

## 2015-01-02 DIAGNOSIS — Z7984 Long term (current) use of oral hypoglycemic drugs: Secondary | ICD-10-CM

## 2015-01-02 DIAGNOSIS — E119 Type 2 diabetes mellitus without complications: Secondary | ICD-10-CM | POA: Diagnosis present

## 2015-01-02 DIAGNOSIS — R0602 Shortness of breath: Secondary | ICD-10-CM | POA: Diagnosis present

## 2015-01-02 DIAGNOSIS — I5033 Acute on chronic diastolic (congestive) heart failure: Secondary | ICD-10-CM | POA: Diagnosis present

## 2015-01-02 DIAGNOSIS — I5020 Unspecified systolic (congestive) heart failure: Secondary | ICD-10-CM

## 2015-01-02 DIAGNOSIS — E782 Mixed hyperlipidemia: Secondary | ICD-10-CM | POA: Diagnosis present

## 2015-01-02 LAB — I-STAT TROPONIN, ED: Troponin i, poc: 0.01 ng/mL (ref 0.00–0.08)

## 2015-01-02 LAB — CBC
HEMATOCRIT: 38.6 % (ref 36.0–46.0)
HEMOGLOBIN: 12.4 g/dL (ref 12.0–15.0)
MCH: 29.5 pg (ref 26.0–34.0)
MCHC: 32.1 g/dL (ref 30.0–36.0)
MCV: 91.9 fL (ref 78.0–100.0)
Platelets: 168 10*3/uL (ref 150–400)
RBC: 4.2 MIL/uL (ref 3.87–5.11)
RDW: 13.8 % (ref 11.5–15.5)
WBC: 6.8 10*3/uL (ref 4.0–10.5)

## 2015-01-02 LAB — BASIC METABOLIC PANEL
Anion gap: 7 (ref 5–15)
BUN: 10 mg/dL (ref 6–20)
CO2: 26 mmol/L (ref 22–32)
CREATININE: 0.6 mg/dL (ref 0.44–1.00)
Calcium: 10.7 mg/dL — ABNORMAL HIGH (ref 8.9–10.3)
Chloride: 109 mmol/L (ref 101–111)
GFR calc Af Amer: >60 mL/min (ref >60)
Glucose, Bld: 202 mg/dL — ABNORMAL HIGH (ref 65–99)
Potassium: 4.4 mmol/L (ref 3.5–5.1)
SODIUM: 142 mmol/L (ref 135–145)

## 2015-01-02 LAB — GLUCOSE, CAPILLARY: Glucose-Capillary: 155 mg/dL — ABNORMAL HIGH (ref 65–99)

## 2015-01-02 LAB — I-STAT CG4 LACTIC ACID, ED: Lactic Acid, Venous: 1.37 mmol/L (ref 0.5–2.0)

## 2015-01-02 LAB — BRAIN NATRIURETIC PEPTIDE: B NATRIURETIC PEPTIDE 5: 232.3 pg/mL — AB (ref 0.0–100.0)

## 2015-01-02 MED ORDER — ENOXAPARIN SODIUM 40 MG/0.4ML ~~LOC~~ SOLN
40.0000 mg | SUBCUTANEOUS | Status: DC
  Administered 2015-01-03 – 2015-01-04 (×3): 40 mg via SUBCUTANEOUS
  Filled 2015-01-02 (×2): qty 0.4

## 2015-01-02 MED ORDER — ASPIRIN 81 MG PO TABS: 81.0000 mg | ORAL_TABLET | Freq: Every day | ORAL | Status: DC

## 2015-01-02 MED ORDER — IRBESARTAN 75 MG PO TABS
37.5000 mg | ORAL_TABLET | Freq: Every day | ORAL | Status: DC
  Administered 2015-01-03 – 2015-01-05 (×3): 37.5 mg via ORAL
  Filled 2015-01-02 (×3): qty 0.5

## 2015-01-02 MED ORDER — FUROSEMIDE 10 MG/ML IJ SOLN
40.0000 mg | Freq: Two times a day (BID) | INTRAMUSCULAR | Status: DC
  Administered 2015-01-03 – 2015-01-05 (×5): 40 mg via INTRAVENOUS
  Filled 2015-01-02 (×5): qty 4

## 2015-01-02 MED ORDER — FUROSEMIDE 10 MG/ML IJ SOLN
40.0000 mg | Freq: Once | INTRAMUSCULAR | Status: AC
  Administered 2015-01-02: 40 mg via INTRAVENOUS
  Filled 2015-01-02: qty 4

## 2015-01-02 MED ORDER — ASPIRIN 81 MG PO CHEW
81.0000 mg | CHEWABLE_TABLET | Freq: Every day | ORAL | Status: DC
  Administered 2015-01-03 – 2015-01-04 (×3): 81 mg via ORAL
  Filled 2015-01-02 (×3): qty 1

## 2015-01-02 MED ORDER — ALBUTEROL SULFATE (2.5 MG/3ML) 0.083% IN NEBU: 2.5000 mg | INHALATION_SOLUTION | RESPIRATORY_TRACT | Status: DC | PRN

## 2015-01-02 MED ORDER — INSULIN ASPART 100 UNIT/ML ~~LOC~~ SOLN
0.0000 [IU] | Freq: Three times a day (TID) | SUBCUTANEOUS | Status: DC
  Administered 2015-01-03 (×2): 1 [IU] via SUBCUTANEOUS
  Administered 2015-01-03: 2 [IU] via SUBCUTANEOUS
  Administered 2015-01-04: 1 [IU] via SUBCUTANEOUS
  Administered 2015-01-04 (×2): 2 [IU] via SUBCUTANEOUS

## 2015-01-02 MED ORDER — ONDANSETRON HCL 4 MG/2ML IJ SOLN
4.0000 mg | Freq: Four times a day (QID) | INTRAMUSCULAR | Status: DC | PRN
  Administered 2015-01-04: 4 mg via INTRAVENOUS
  Filled 2015-01-02: qty 2

## 2015-01-02 MED ORDER — CARVEDILOL 25 MG PO TABS
25.0000 mg | ORAL_TABLET | Freq: Two times a day (BID) | ORAL | Status: DC
  Administered 2015-01-03 – 2015-01-05 (×5): 25 mg via ORAL
  Filled 2015-01-02 (×5): qty 1

## 2015-01-02 MED ORDER — INSULIN ASPART 100 UNIT/ML ~~LOC~~ SOLN: 0.0000 [IU] | Freq: Every day | SUBCUTANEOUS | Status: DC

## 2015-01-02 MED ORDER — NITROGLYCERIN 2 % TD OINT
0.5000 [in_us] | TOPICAL_OINTMENT | Freq: Four times a day (QID) | TRANSDERMAL | Status: DC
  Administered 2015-01-03 – 2015-01-05 (×7): 0.5 [in_us] via TOPICAL
  Filled 2015-01-02: qty 30

## 2015-01-02 NOTE — ED Notes (Signed)
Pt c/o shortness of breath onset couple days increasing each day. Pt able to talk in complete sentences.

## 2015-01-02 NOTE — ED Provider Notes (Signed)
CSN: [id number]     Arrival date & time 01/02/15  1147 History   First MD Initiated Contact with Patient 01/02/15 1500     Chief Complaint  Patient presents with  . Shortness of Breath     (Consider location/radiation/quality/duration/timing/severity/associated sxs/prior Treatment) Patient is a 81 y.o. female presenting with shortness of breath. The history is provided by the patient (Patient complains shortness of breath. Worse when exerting herself. This has been going on for a week).  Shortness of Breath Severity:  Moderate Onset quality:  Sudden Timing:  Constant Progression:  Worsening Chronicity:  New Context: activity   Associated symptoms: no abdominal pain, no chest pain, no cough, no headaches and no rash     Past Medical History  Diagnosis Date  . CAD (coronary artery disease)   . Pacemaker   . AV block   . Hyperlipidemia   . Hypertension   . Diabetes mellitus     Type II  . Glaucoma    Past Surgical History  Procedure Laterality Date  . Tonsillectomy  1937  . Breast biopsy  1991  . Left total knee replacement  1996  . Pacemaker insertion      St. Jude  . Femur im nail  01/18/2012    Procedure: INTRAMEDULLARY (IM) NAIL FEMORAL;  Surgeon: Senaida Lange, MD;  Location: [facility] OR;  Service: Orthopedics;  Laterality: Left;   Family History  Problem Relation Age of Onset  . Cancer    . Coronary artery disease    . Stroke    . Diabetes    . Hypertension     Social History  Substance Use Topics  . Smoking status: Former Smoker    Types: Cigarettes    Quit date: 01/17/1951  . Smokeless tobacco: Never Used  . Alcohol Use: Yes     Comment: Occasional glass of wine   OB History    No data available     Review of Systems  Constitutional: Negative for appetite change and fatigue.  HENT: Negative for congestion, ear discharge and sinus pressure.   Eyes: Negative for discharge.  Respiratory: Positive for shortness of breath. Negative for cough.    Cardiovascular: Negative for chest pain.  Gastrointestinal: Negative for abdominal pain and diarrhea.  Genitourinary: Negative for frequency and hematuria.  Musculoskeletal: Negative for back pain.  Skin: Negative for rash.  Neurological: Negative for seizures and headaches.  Psychiatric/Behavioral: Negative for hallucinations.      Allergies  Ciprofloxacin; Atorvastatin; Cedax; Plavix; Statins; and Ace inhibitors  Home Medications   Prior to Admission medications   Medication Sig Start Date End Date Taking? Authorizing Provider  aspirin 81 MG tablet Take 81 mg by mouth at bedtime.   Yes Historical Provider, MD  carvedilol (COREG) 25 MG tablet TAKE  (1)  TABLET TWICE A DAY WITH MEALS (BREAKFAST AND SUPPER) 08/07/14  Yes Duke Salvia, MD  Coenzyme Q10 (EQL COQ10) 300 MG CAPS Take 200 mg by mouth daily.    Yes Historical Provider, MD  DHA-EPA-Vitamin E (OMEGA-3 COMPLEX PO) Take 1 tablet by mouth 3 (three) times daily.    Yes Historical Provider, MD  MEGARED OMEGA-3 KRILL OIL 500 MG CAPS Take 1 tablet by mouth daily.   Yes Historical Provider, MD  metFORMIN (GLUCOPHAGE-XR) 500 MG 24 hr tablet Take 1,000 mg by mouth 2 (two) times daily. 12/10/14  Yes Historical Provider, MD  Multiple Vitamin (MULTIVITAMIN) tablet Take 1 tablet by mouth daily. CENTRUM SILVER   Yes Historical  Provider, MD  valsartan-hydrochlorothiazide (DIOVAN-HCT) 160-12.5 MG per tablet Take 1 tablet by mouth daily.   Yes Historical Provider, MD   BP 134/98 mmHg  Pulse 52  Temp(Src) 97.7 F (36.5 C) (Oral)  Resp 18  Ht 5\' 4"  (1.626 m)  Wt 194 lb 3 oz (88.083 kg)  BMI 33.32 kg/m2  SpO2 96% Physical Exam  Constitutional: She is oriented to person, place, and time. She appears well-developed.  HENT:  Head: Normocephalic.  Eyes: Conjunctivae and EOM are normal. No scleral icterus.  Neck: Neck supple. No thyromegaly present.  Cardiovascular: Normal rate and regular rhythm.  Exam reveals no gallop and no friction  rub.   No murmur heard. Pulmonary/Chest: No stridor. She has no wheezes. She has no rales. She exhibits no tenderness.  Abdominal: She exhibits no distension. There is no tenderness. There is no rebound.  Musculoskeletal: Normal range of motion. She exhibits edema.  3+ edema in ankles  Lymphadenopathy:    She has no cervical adenopathy.  Neurological: She is oriented to person, place, and time. She exhibits normal muscle tone. Coordination normal.  Skin: No rash noted. No erythema.  Psychiatric: She has a normal mood and affect. Her behavior is normal.    ED Course  Procedures (including critical care time) Labs Review Labs Reviewed  BASIC METABOLIC PANEL - Abnormal; Notable for the following:    Glucose, Bld 202 (*)    Calcium 10.7 (*)    All other components within normal limits  BRAIN NATRIURETIC PEPTIDE - Abnormal; Notable for the following:    B Natriuretic Peptide 232.3 (*)    All other components within normal limits  CBC  I-STAT TROPOININ, ED  I-STAT CG4 LACTIC ACID, ED    Imaging Review Dg Chest 2 View  01/02/2015  CLINICAL DATA:  Left-sided chest pressure since yesterday. EXAM: CHEST  2 VIEW COMPARISON:  10/15/2013 FINDINGS: Dual lead cardiac pacemaker is stable. Cardiomediastinal silhouette is enlarged. Mediastinal contours appear intact. There is coarsening of the interstitial markings. Airspace consolidation is seen within the right lower lobe. There is a small right pleural effusion. No evidence of pneumothorax or left pleural effusion. Osseous structures are without acute abnormality. Soft tissues are grossly normal. IMPRESSION: Enlarged cardiac silhouette with interstitial pulmonary edema. Small right pleural effusion with right basilar atelectasis versus airspace consolidation. Electronically Signed   By: Ted Mcalpineobrinka  Dimitrova M.D.   On: 01/02/2015 12:21   I have personally reviewed and evaluated these images and lab results as part of my medical decision-making.    EKG Interpretation   Date/Time:  Saturday January 02 2015 11:56:11 EST Ventricular Rate:  50 PR Interval:    QRS Duration: 176 QT Interval:  506 QTC Calculation: 461 R Axis:   -82 Text Interpretation:  Ventricular-paced rhythm Abnormal ECG Confirmed by  Ethelda Deangelo  MD, Lysander Calixte [phone]) on 01/02/2015 3:15:13 PM      MDM   Final diagnoses:  CHF (congestive heart failure), NYHA class I, unspecified failure chronicity, systolic (HCC)    Patient will be admitted for congestive heart failure    Bethann BerkshireJoseph Lewellyn Fultz, MD 01/02/15 [id number]

## 2015-01-02 NOTE — H&P (Addendum)
Patient Demographics:    Brittany [name], is a 81 y.o. female  MRN: [id number]   DOB - Sep 01, 1927  Admit Date - 01/02/2015  Outpatient Primary MD for the patient is Josue HectorNYLAND,LEONARD ROBERT, MD   With History of -  Past Medical History  Diagnosis Date  . CAD (coronary artery disease)   . Pacemaker   . AV block   . Hyperlipidemia   . Hypertension   . Diabetes mellitus     Type II  . Glaucoma       Past Surgical History  Procedure Laterality Date  . Tonsillectomy  1937  . Breast biopsy  1991  . Left total knee replacement  1996  . Pacemaker insertion      St. Jude  . Femur im nail  01/18/2012    Procedure: INTRAMEDULLARY (IM) NAIL FEMORAL;  Surgeon: Senaida LangeKevin M Supple, MD;  Location: [facility] OR;  Service: Orthopedics;  Laterality: Left;    in for   Chief Complaint  Patient presents with  . Shortness of Breath      HPI:    Brittany [name]  is a 81 y.o. female, with H/O CAD post angioplasty in 2007, AV block needing Pacemaker ( Dr Graciela HusbandsKlein), DM2, HTN, Obesity, comes to the hospital with few day h/o gradually progressive exertional SOB, orthopnea, leg edema, weight gain and frothy cough, no fever, no chest pain, no exposure to sick contacts, no recent travel.  In the ER workup showed CHF and I was called to admit.    Review of systems:    In addition to the HPI above,   No Fever-chills, No Headache, No changes with Vision or hearing, No problems swallowing food or Liquids, No Chest pain, As aboveCough & Shortness of Breath, No Abdominal pain, No Nausea or Vommitting, Bowel movements are regular, No Blood in stool or Urine, No dysuria, No new skin rashes or bruises, No new joints pains-aches,  No new weakness, tingling, numbness in any extremity, No recent weight gain or loss, No polyuria, polydypsia or  polyphagia, No significant Mental Stressors.  A full 10 point Review of Systems was done, except as stated above, all other Review of Systems were negative.    Social History:     Social History  Substance Use Topics  . Smoking status: Former Smoker    Types: Cigarettes    Quit date: 01/17/1951  . Smokeless tobacco: Never Used  . Alcohol Use: Yes     Comment: Occasional glass of wine    Lives - at home, mobile      Family History :     Family History  Problem Relation Age of Onset  . Cancer    . Coronary artery disease    . Stroke    . Diabetes    . Hypertension         Home Medications:   Prior to Admission medications   Medication Sig Start  Date End Date Taking? Authorizing Provider  aspirin 81 MG tablet Take 81 mg by mouth at bedtime.   Yes Historical Provider, MD  carvedilol (COREG) 25 MG tablet TAKE  (1)  TABLET TWICE A DAY WITH MEALS (BREAKFAST AND SUPPER) 08/07/14  Yes Duke Salvia, MD  Coenzyme Q10 (EQL COQ10) 300 MG CAPS Take 200 mg by mouth daily.    Yes Historical Provider, MD  DHA-EPA-Vitamin E (OMEGA-3 COMPLEX PO) Take 1 tablet by mouth 3 (three) times daily.    Yes Historical Provider, MD  MEGARED OMEGA-3 KRILL OIL 500 MG CAPS Take 1 tablet by mouth daily.   Yes Historical Provider, MD  metFORMIN (GLUCOPHAGE-XR) 500 MG 24 hr tablet Take 1,000 mg by mouth 2 (two) times daily. 12/10/14  Yes Historical Provider, MD  Multiple Vitamin (MULTIVITAMIN) tablet Take 1 tablet by mouth daily. CENTRUM SILVER   Yes Historical Provider, MD  valsartan-hydrochlorothiazide (DIOVAN-HCT) 160-12.5 MG per tablet Take 1 tablet by mouth daily.   Yes Historical Provider, MD     Allergies:     Allergies  Allergen Reactions  . Ciprofloxacin Other (See Comments)    Severe chest pains  . Atorvastatin Other (See Comments)    REACTION: Myalgia  . Cedax [Ceftibuten] Other (See Comments)    Possibly nausea/vomiting, cyanosis, severe headaches  . Plavix [Clopidogrel  Bisulfate] Other (See Comments)    Possibly nausea/vomiting, cyanosis, severe headaches    . Statins Other (See Comments)    Myalgias, cyanosis  . Ace Inhibitors Other (See Comments)    REACTION: cough and sore throat     Physical Exam:   Vitals  Blood pressure 145/53, pulse 50, temperature 97.7 F (36.5 C), temperature source Oral, resp. rate 20, height  (1.626 m), weight 88.083 kg (194 lb 3 oz), SpO2 96 %.   1. General pleasant obese elderly white female lying in bed in NAD,     2. Normal affect and insight, Not Suicidal or Homicidal, Awake Alert, Oriented X 3.  3. No F.N deficits, ALL C.Nerves Intact, Strength 5/5 all 4 extremities, Sensation intact all 4 extremities, Plantars down going.  4. Ears and Eyes appear Normal, Conjunctivae clear, PERRLA. Moist Oral Mucosa.  5. Supple Neck, + JVD, No cervical lymphadenopathy appriciated, No Carotid Bruits.  6. Symmetrical Chest wall movement, Good air movement bilaterally, + rales  7. RRR, No Gallops, Rubs or Murmurs, No Parasternal Heave.  8. Positive Bowel Sounds, Abdomen Soft, No tenderness, No organomegaly appriciated,No rebound -guarding or rigidity.  9.  No Cyanosis, Normal Skin Turgor, No Skin Rash or Bruise. 2+ leg edema  10. Good muscle tone,  joints appear normal , no effusions, Normal ROM.  11. No Palpable Lymph Nodes in Neck or Axillae      Data Review:    CBC  Recent Labs Lab 01/02/15 1155  WBC 6.8  HGB 12.4  HCT 38.6  PLT 168  MCV 91.9  MCH 29.5  MCHC 32.1  RDW 13.8   ------------------------------------------------------------------------------------------------------------------  Chemistries   Recent Labs Lab 01/02/15 1155  NA 142  K 4.4  CL 109  CO2 26  GLUCOSE 202*  BUN 10  CREATININE 0.60  CALCIUM 10.7*   ------------------------------------------------------------------------------------------------------------------ estimated creatinine clearance is 55.3 mL/min (by C-G  formula based on Cr of 0.6). ------------------------------------------------------------------------------------------------------------------ No results for input(s): TSH, T4TOTAL, T3FREE, THYROIDAB in the last 72 hours.  Invalid input(s): FREET3   Coagulation profile No results for input(s): INR, PROTIME in the last 168 hours. ------------------------------------------------------------------------------------------------------------------- No results  for input(s): DDIMER in the last 72 hours. -------------------------------------------------------------------------------------------------------------------  Cardiac Enzymes No results for input(s): CKMB, TROPONINI, MYOGLOBIN in the last 168 hours.  Invalid input(s): CK ------------------------------------------------------------------------------------------------------------------ Invalid input(s): POCBNP   ---------------------------------------------------------------------------------------------------------------  Urinalysis No results found for: COLORURINE, APPEARANCEUR, LABSPEC, PHURINE, GLUCOSEU, HGBUR, BILIRUBINUR, KETONESUR, PROTEINUR, UROBILINOGEN, NITRITE, LEUKOCYTESUR  ----------------------------------------------------------------------------------------------------------------   Imaging Results:    Dg Chest 2 View  01/02/2015  CLINICAL DATA:  Left-sided chest pressure since yesterday. EXAM: CHEST  2 VIEW COMPARISON:  10/15/2013 FINDINGS: Dual lead cardiac pacemaker is stable. Cardiomediastinal silhouette is enlarged. Mediastinal contours appear intact. There is coarsening of the interstitial markings. Airspace consolidation is seen within the right lower lobe. There is a small right pleural effusion. No evidence of pneumothorax or left pleural effusion. Osseous structures are without acute abnormality. Soft tissues are grossly normal. IMPRESSION: Enlarged cardiac silhouette with interstitial pulmonary edema. Small  right pleural effusion with right basilar atelectasis versus airspace consolidation. Electronically Signed   By: Ted Mcalpine M.D.   On: 01/02/2015 12:21    My personal review of EKG: Rhythm V Paced rythm   Assessment & Plan:    1. Acute on Chr Diastolic CHF last EF 60% 10 yrs ago - Tele admit, NTG paste, IV Lasix, fluid and salt restriction, o2, nebs, Coreg and ARB, recheck Echo and monitor BMP.  2.H/O Pacemaker - no acute issues  3.H/O CAD - post angioplasty in 2007 - stable, ASA and Coreg for secondary prevention.  4.DM2 - check A1c, hold Glucophage, ISS.  5.HTN - on ARB, Coreg added NTG, monitor.    DVT Prophylaxis Lovenox   AM Labs Ordered, also please review Full Orders  Family Communication: Admission, patients condition and plan of care including tests being ordered have been discussed with the patient and daughter who indicate understanding and agree with the plan and Code Status.  Code Status Full  Likely DC to  Home 2-3 days  Condition GUARDED     Time spent in minutes : 35   SINGH,PRASHANT K M.D on 01/02/2015 at 6:03 PM  Between 7am to 7pm - Pager - [phone]  After 7pm go to [email or web address] - password [email or web address]  [facility] - Office  [phone]

## 2015-01-02 NOTE — Progress Notes (Signed)
ANTICOAGULATION CONSULT NOTE - Initial Consult  Pharmacy Consult for Lovenox Indication: VTE prophylaxis   Labs:  Recent Labs  01/02/15 1155  HGB 12.4  HCT 38.6  PLT 168  CREATININE 0.60    Estimated Creatinine Clearance: 54.1 mL/min (by C-G formula based on Cr of 0.6).   Medical History: Past Medical History  Diagnosis Date  . CAD (coronary artery disease)   . Pacemaker   . AV block   . Hyperlipidemia   . Hypertension   . Diabetes mellitus     Type II  . Glaucoma      Assessment: 81 yo female admitted with SOB, orthopnea, leg edema and weight gain.  Pharmacy asked to add Lovenox for DVT px.  CrCl ~ 54 ml/min  Goal of Therapy:  DVT prophylaxis Monitor platelets by anticoagulation protocol: Yes   Plan:  1. Lovenox 40 mg daily. 2. Pharmacy will sign off.  Tad MooreJessica Mahum Betten, Pharm D, BCPS  Clinical Pharmacist Pager [phone]  01/02/2015 10:15 PM

## 2015-01-02 NOTE — ED Notes (Signed)
Ordered meal tray for pt 

## 2015-01-03 ENCOUNTER — Inpatient Hospital Stay (HOSPITAL_COMMUNITY): Payer: Medicare Other

## 2015-01-03 DIAGNOSIS — I509 Heart failure, unspecified: Secondary | ICD-10-CM

## 2015-01-03 LAB — BASIC METABOLIC PANEL
ANION GAP: 7 (ref 5–15)
BUN: 10 mg/dL (ref 6–20)
CALCIUM: 10.3 mg/dL (ref 8.9–10.3)
CO2: 29 mmol/L (ref 22–32)
Chloride: 106 mmol/L (ref 101–111)
Creatinine, Ser: 0.7 mg/dL (ref 0.44–1.00)
GFR calc Af Amer: >60 mL/min (ref >60)
GFR calc non Af Amer: >60 mL/min (ref >60)
GLUCOSE: 135 mg/dL — AB (ref 65–99)
Potassium: 3.8 mmol/L (ref 3.5–5.1)
Sodium: 142 mmol/L (ref 135–145)

## 2015-01-03 LAB — GLUCOSE, CAPILLARY
GLUCOSE-CAPILLARY: 130 mg/dL — AB (ref 65–99)
GLUCOSE-CAPILLARY: 113 mg/dL — AB (ref 65–99)
Glucose-Capillary: 149 mg/dL — ABNORMAL HIGH (ref 65–99)
Glucose-Capillary: 161 mg/dL — ABNORMAL HIGH (ref 65–99)

## 2015-01-03 LAB — TSH: TSH: 1.793 u[IU]/mL (ref 0.350–4.500)

## 2015-01-03 LAB — MAGNESIUM: Magnesium: 2 mg/dL (ref 1.7–2.4)

## 2015-01-03 MED ORDER — POTASSIUM CHLORIDE CRYS ER 20 MEQ PO TBCR
40.0000 meq | EXTENDED_RELEASE_TABLET | Freq: Once | ORAL | Status: AC
  Administered 2015-01-03: 40 meq via ORAL
  Filled 2015-01-03: qty 2

## 2015-01-03 MED ORDER — FUROSEMIDE 10 MG/ML IJ SOLN
INTRAMUSCULAR | Status: AC
  Filled 2015-01-03: qty 4

## 2015-01-03 NOTE — Progress Notes (Signed)
Patient Demographics:    Brittany [name], is a 81 y.o. female, DOB - [birth year], ZOX:[id number]  Admit date - 01/02/2015   Admitting Physician Leroy Sea, MD  Outpatient Primary MD for the patient is Josue Hector, MD  LOS - 1   Chief Complaint  Patient presents with  . Shortness of Breath        Subjective:    Brittany [name] today has, No headache, No chest pain, No abdominal pain - No Nausea, No new weakness tingling or numbness, No Cough - Improved SOB.     Assessment  & Plan :   1. Acute on Chr Diastolic CHF last EF 60% 10 yrs ago - improved after diuresis, continue monitoring on telemetry, continue Nitropaste along with IV Lasix, fluid and salt restriction,oxygen and nebulizer treatments if needed, continue on Coreg and ARB, pending echogram.   2.H/O Pacemaker - no acute issues.   3.H/O CAD - post angioplasty in 2007 - stable, ASA and Coreg for secondary prevention.   4. HTN - on ARB, Coreg added NTG, monitor.    5. DM2 - check A1c, hold Glucophage, ISS.  Lab Results  Component Value Date   HGBA1C 6.2* 11/05/2006    CBG (last 3)   Recent Labs  01/02/15 2216 01/03/15 0621  GLUCAP 155* 130*      Code Status : Full  Family Communication  : daughter bedside  Disposition Plan  : Home 1-2 days  Consults  : None  Procedures  :   TTE  DVT Prophylaxis  :  Lovenox   Lab Results  Component Value Date   PLT 168 01/02/2015    Inpatient Medications  Scheduled Meds: . aspirin  81 mg Oral QHS  . carvedilol  25 mg Oral BID WC  . enoxaparin (LOVENOX) injection  40 mg Subcutaneous Q24H  . furosemide  40 mg Intravenous BID  . insulin aspart  0-5 Units Subcutaneous QHS  . insulin aspart  0-9 Units Subcutaneous TID WC  . irbesartan  37.5 mg Oral Daily  .  nitroGLYCERIN  0.5 inch Topical 4 times per day  . potassium chloride  40 mEq Oral Once   Continuous Infusions:  PRN Meds:.albuterol, ondansetron (ZOFRAN) IV  Antibiotics  :    Anti-infectives    None        Objective:   Filed Vitals:   01/02/15 1922 01/02/15 2023 01/03/15 0004 01/03/15 0420  BP: 166/53 149/60 131/42 134/53  Pulse: 62 57 50 50  Temp: 97.8 F (36.6 C) 98.7 F (37.1 C) 98.4 F (36.9 C) 97.9 F (36.6 C)  TempSrc: Oral Oral Oral Oral  Resp: Height:  (1.626 m)     Weight: 84.641 kg (186 lb 9.6 oz)   85.821 kg (189 lb 3.2 oz)  SpO2: 98% 95% 96% 96%    Wt Readings from Last 3 Encounters:  01/03/15 85.821 kg (189 lb 3.2 oz)  01/01/14 84.097 kg (185 lb 6.4 oz)  06/25/13 88.451 kg (195 lb)     Intake/Output Summary (Last 24 hours) at 01/03/15 0849 Last data filed at 01/03/15 0421  Gross per 24 hour  Intake      0 ml  Output    925  ml  Net   -925 ml     Physical Exam  Awake Alert, Oriented X 3, No new F.N deficits, Normal affect [place].AT,PERRAL Supple Neck,No JVD, No cervical lymphadenopathy appriciated.  Symmetrical Chest wall movement, Good air movement bilaterally, + rales RRR,No Gallops,Rubs or new Murmurs, No Parasternal Heave +ve B.Sounds, Abd Soft, No tenderness, No organomegaly appriciated, No rebound - guarding or rigidity. No Cyanosis, Clubbing , 1+ leg edema, No new Rash or bruise    Data Review:   Micro Results No results found for this or any previous visit (from the past 240 hour(s)).  Radiology Reports Dg Chest 2 View  01/02/2015  CLINICAL DATA:  Left-sided chest pressure since yesterday. EXAM: CHEST  2 VIEW COMPARISON:  10/15/2013 FINDINGS: Dual lead cardiac pacemaker is stable. Cardiomediastinal silhouette is enlarged. Mediastinal contours appear intact. There is coarsening of the interstitial markings. Airspace consolidation is seen within the right lower lobe. There is a small right pleural effusion. No evidence  of pneumothorax or left pleural effusion. Osseous structures are without acute abnormality. Soft tissues are grossly normal. IMPRESSION: Enlarged cardiac silhouette with interstitial pulmonary edema. Small right pleural effusion with right basilar atelectasis versus airspace consolidation. Electronically Signed   By: Ted Mcalpineobrinka  Dimitrova M.D.   On: 01/02/2015 12:21     CBC  Recent Labs Lab 01/02/15 1155  WBC 6.8  HGB 12.4  HCT 38.6  PLT 168  MCV 91.9  MCH 29.5  MCHC 32.1  RDW 13.8    Chemistries   Recent Labs Lab 01/02/15 1155 01/03/15 0358  NA 142 142  K 4.4 3.8  CL 109 106  CO2 26 29  GLUCOSE 202* 135*  BUN 10 10  CREATININE 0.60 0.70  CALCIUM 10.7* 10.3  MG  --  2.0   ------------------------------------------------------------------------------------------------------------------ estimated creatinine clearance is 54.5 mL/min (by C-G formula based on Cr of 0.7). ------------------------------------------------------------------------------------------------------------------ No results for input(s): HGBA1C in the last 72 hours. ------------------------------------------------------------------------------------------------------------------ No results for input(s): CHOL, HDL, LDLCALC, TRIG, CHOLHDL, LDLDIRECT in the last 72 hours. ------------------------------------------------------------------------------------------------------------------  Recent Labs  01/02/15 2242  TSH 1.793   ------------------------------------------------------------------------------------------------------------------ No results for input(s): VITAMINB12, FOLATE, FERRITIN, TIBC, IRON, RETICCTPCT in the last 72 hours.  Coagulation profile No results for input(s): INR, PROTIME in the last 168 hours.  No results for input(s): DDIMER in the last 72 hours.  Cardiac Enzymes No results for input(s): CKMB, TROPONINI, MYOGLOBIN in the last 168 hours.  Invalid input(s):  CK ------------------------------------------------------------------------------------------------------------------ Invalid input(s): POCBNP   Time Spent in minutes  35   SINGH,PRASHANT K M.D on 01/03/2015 at 8:49 AM  Between 7am to 7pm - Pager - [phone]  After 7pm go to [email or web address] - password [email or web address]  [facility] -  Office  [phone]

## 2015-01-03 NOTE — Progress Notes (Signed)
Echocardiogram 2D Echocardiogram has been performed.  Nolon RodBrown, Tony 01/03/2015, 3:41 PM

## 2015-01-04 ENCOUNTER — Inpatient Hospital Stay (HOSPITAL_COMMUNITY): Payer: Medicare Other

## 2015-01-04 LAB — GLUCOSE, CAPILLARY
GLUCOSE-CAPILLARY: 164 mg/dL — AB (ref 65–99)
GLUCOSE-CAPILLARY: 159 mg/dL — AB (ref 65–99)
Glucose-Capillary: 146 mg/dL — ABNORMAL HIGH (ref 65–99)
Glucose-Capillary: 144 mg/dL — ABNORMAL HIGH (ref 65–99)

## 2015-01-04 LAB — BASIC METABOLIC PANEL
ANION GAP: 9 (ref 5–15)
BUN: 15 mg/dL (ref 6–20)
CALCIUM: 10.5 mg/dL — AB (ref 8.9–10.3)
CO2: 29 mmol/L (ref 22–32)
Chloride: 104 mmol/L (ref 101–111)
Creatinine, Ser: 0.77 mg/dL (ref 0.44–1.00)
GLUCOSE: 145 mg/dL — AB (ref 65–99)
POTASSIUM: 3.9 mmol/L (ref 3.5–5.1)
Sodium: 142 mmol/L (ref 135–145)

## 2015-01-04 LAB — TROPONIN I: Troponin I: <0.03 ng/mL (ref <0.031)

## 2015-01-04 LAB — MAGNESIUM: Magnesium: 2 mg/dL (ref 1.7–2.4)

## 2015-01-04 MED ORDER — METOLAZONE 2.5 MG PO TABS
2.5000 mg | ORAL_TABLET | Freq: Once | ORAL | Status: AC
  Administered 2015-01-04: 2.5 mg via ORAL
  Filled 2015-01-04: qty 1

## 2015-01-04 MED ORDER — NITROGLYCERIN 0.4 MG SL SUBL
0.4000 mg | SUBLINGUAL_TABLET | SUBLINGUAL | Status: DC | PRN
  Administered 2015-01-04 (×3): 0.4 mg via SUBLINGUAL

## 2015-01-04 MED ORDER — NITROGLYCERIN 0.4 MG SL SUBL
SUBLINGUAL_TABLET | SUBLINGUAL | Status: AC
Start: 2015-01-04 — End: 2015-01-04
  Filled 2015-01-04: qty 1

## 2015-01-04 MED ORDER — POTASSIUM CHLORIDE CRYS ER 20 MEQ PO TBCR
40.0000 meq | EXTENDED_RELEASE_TABLET | Freq: Once | ORAL | Status: AC
  Administered 2015-01-04: 40 meq via ORAL
  Filled 2015-01-04: qty 2

## 2015-01-04 MED ORDER — GI COCKTAIL ~~LOC~~
30.0000 mL | Freq: Once | ORAL | Status: AC
  Administered 2015-01-04: 30 mL via ORAL
  Filled 2015-01-04: qty 30

## 2015-01-04 NOTE — Progress Notes (Signed)
PT Cancellation Note  Patient Details Name: Brittany [name] MRN: [id number] DOB: Dec 17, 1927   Cancelled Treatment:    Reason Eval/Treat Not Completed: Other (comment).  Pt on BSC having a BM.  "It might take a while, she is constipated" per caregiver in the room.  PT will check back later today as time allows.    Thanks,    Rollene Rotundaebecca B. Baruch Lewers, PT, DPT [phone]   01/04/2015, 12:44 PM

## 2015-01-04 NOTE — Evaluation (Signed)
Physical Therapy Evaluation Patient Details Name: Brittany [name] MRN: [id number] DOB: [birth year] Today's Date: 01/04/2015   History of Present Illness  81 y.o. female admitted to [facility] on 01/02/15 for SOB.  Pt dx with acute on chronic diastolic CHF.  Pt with significant PMhx of CAD, pacemaker, AV block, HTN, DM, TKA, IM nail in femur L (01/2012).    Clinical Impression  Pt is doing well from a mobility standpoint. She is walking supervision with RW the entire unit. She does have some DOE with gait, but only after fatigue and O2 sats and HR remained stable throughout on RA.  Pt would benefit from acute PT, but will not need f/u PT or equipment at discharge.   PT to follow acutely for deficits listed below.       Follow Up Recommendations No PT follow up    Equipment Recommendations  None recommended by PT    Recommendations for Other Services   NA    Precautions / Restrictions   None     Mobility  Bed Mobility               General bed mobility comments: pt OOB in recliner chair  Transfers Overall transfer level: Modified independent Equipment used: Rolling walker (2 wheeled)             General transfer comment: Pt uses hands for transitions, but able to get up and down unassisted  Ambulation/Gait Ambulation/Gait assistance: Supervision Ambulation Distance (Feet): 300 Feet Assistive device: Rolling walker (2 wheeled) Gait Pattern/deviations: Step-through pattern;Shuffle     General Gait Details: pt needed one standing rest break, increased DOE at end of gait 2/4, but O2 sats >90% on RA, HR stable         Balance Overall balance assessment: Needs assistance Sitting-balance support: Feet supported;No upper extremity supported Sitting balance-Leahy Scale: Good     Standing balance support: Bilateral upper extremity supported;Single extremity supported;No upper extremity supported Standing balance-Leahy Scale: Fair                                Pertinent Vitals/Pain Pain Assessment: No/denies pain    Home Living Family/patient expects to be discharged to:: Private residence Living Arrangements: Children (daughter who works) Available Help at Discharge: Available PRN/intermittently;Family Type of Home: House Home Access: Ramped entrance     Home Layout: Two level;Able to live on main level with bedroom/bathroom (pt lives on first floor, daughter on second floor) Home Equipment: Dan HumphreysWalker - 2 wheels;Cane - quad;Bedside commode;Shower seat      Prior Function Level of Independence: Independent with assistive device(s)         Comments: uses RW full time        Extremity/Trunk Assessment   Upper Extremity Assessment: Overall WFL for tasks assessed           Lower Extremity Assessment: Generalized weakness      Cervical / Trunk Assessment: Normal  Communication   Communication: No difficulties  Cognition Arousal/Alertness: Awake/alert Behavior During Therapy: WFL for tasks assessed/performed Overall Cognitive Status: Within Functional Limits for tasks assessed                               Assessment/Plan    PT Assessment Patient needs continued PT services  PT Diagnosis Generalized weakness   PT Problem List Decreased strength;Decreased activity tolerance;Decreased balance;Decreased mobility;Decreased knowledge of  use of DME;Cardiopulmonary status limiting activity  PT Treatment Interventions DME instruction;Gait training;Functional mobility training;Therapeutic exercise;Therapeutic activities;Balance training;Neuromuscular re-education;Patient/family education   PT Goals (Current goals can be found in the Care Plan section) Acute Rehab PT Goals Patient Stated Goal: to get back home to her PLOF PT Goal Formulation: With patient Time For Goal Achievement: 01/18/15 Potential to Achieve Goals: Good    Frequency Min 3X/week    End of Session   Activity Tolerance: Patient tolerated  treatment well Patient left: in chair;with call bell/phone within reach           Time: 1342-1406 PT Time Calculation (min) (ACUTE ONLY): 24 min   Charges:   PT Evaluation $Initial PT Evaluation Tier I: 1 Procedure PT Treatments $Gait Training: 8-22 mins        Anjelika Ausburn B. Annie Roseboom, PT, DPT [phone]   01/04/2015, 3:09 PM

## 2015-01-04 NOTE — Progress Notes (Signed)
Patient Demographics:    Brittany [name], is a 81 y.o. female, DOB - 07-02-1927, [id number]  Admit date - 01/02/2015   Admitting Physician Brittany SeaPrashant K Brittany Malone, MD  Outpatient Primary MD for the patient is Brittany HectorNYLAND,LEONARD ROBERT, MD  LOS - 2   Chief Complaint  Patient presents with  . Shortness of Breath        Subjective:    Brittany [name] today has, No headache, mild nonradiating substernal chest pain, No abdominal pain - No Nausea, No new weakness tingling or numbness, No Cough - Improved SOB.     Assessment  & Plan :   1. Acute on Chr Diastolic CHF  EF 62%60% - improved after diuresis, continue monitoring on telemetry, continue Nitropaste along with IV Lasix, fluid and salt restriction,oxygen and nebulizer treatments if needed, continue on Coreg and ARB, much improved echogram noted. So far 2 L negative.   2.H/O Pacemaker - no acute issues.   3.H/O CAD - post angioplasty in 2007 - stable, ASA and Coreg for secondary prevention.   4. HTN - on ARB, Coreg added NTG, monitor.    5. Atypical chest discomfort morning of 01/04/2015. Appeared more reflux related, EKG was again paced rhythm, chest x-ray unremarkable, pain improved after GI cocktail and nitroglycerin, cycle troponins and monitor. She is already on aspirin and Coreg which will be continued. Completely pain-free at this time.    6.DM2 - check A1c, hold Glucophage, ISS.  Lab Results  Component Value Date   HGBA1C 6.2* 11/05/2006    CBG (last 3)   Recent Labs  01/03/15 1654 01/03/15 2131 01/04/15 0610  GLUCAP 161* 113* 146*      Code Status : Full  Family Communication  : daughter bedside  Disposition Plan  : Home 1-2 days  Consults  : None  Procedures  :   TTE  Left ventricle: The cavity size was normal. Wall  thickness was increased in a pattern of mild LVH. Systolic function was normal. The estimated ejection fraction was in the range of 55% to 60%. Wall motion was normal; there were no regional wall motionabnormalities. - Aortic valve: There was trivial regurgitation. - Mitral valve: Calcified annulus. There was mild regurgitation. - Left atrium: The atrium was moderately dilated. - Right atrium: The atrium was mildly dilated. - Pulmonary arteries: PA peak pressure: 53 mm Hg (S).   DVT Prophylaxis  :  Lovenox   Lab Results  Component Value Date   PLT 168 01/02/2015    Inpatient Medications  Scheduled Meds: . aspirin  81 mg Oral QHS  . carvedilol  25 mg Oral BID WC  . enoxaparin (LOVENOX) injection  40 mg Subcutaneous Q24H  . furosemide  40 mg Intravenous BID  . insulin aspart  0-5 Units Subcutaneous QHS  . insulin aspart  0-9 Units Subcutaneous TID WC  . irbesartan  37.5 mg Oral Daily  . metolazone  2.5 mg Oral Once  . nitroGLYCERIN  0.5 inch Topical 4 times per day  . nitroGLYCERIN       Continuous Infusions:  PRN Meds:.albuterol, nitroGLYCERIN, ondansetron (ZOFRAN) IV  Antibiotics  :    Anti-infectives    None        Objective:   Filed Vitals:  01/04/15 0830 01/04/15 0832 01/04/15 0851 01/04/15 0900  BP: 79/65 103/51 104/46 94/45  Pulse:  79 61 50  Temp:      TempSrc:      Resp:      Height:      Weight:      SpO2:        Wt Readings from Last 3 Encounters:  01/04/15 81.92 kg (180 lb 9.6 oz)  01/01/14 84.097 kg (185 lb 6.4 oz)  06/25/13 88.451 kg (195 lb)     Intake/Output Summary (Last 24 hours) at 01/04/15 1021 Last data filed at 01/04/15 0908  Gross per 24 hour  Intake    720 ml  Output   1700 ml  Net   -980 ml     Physical Exam  Awake Alert, Oriented X 3, No new F.N deficits, Normal affect [name].AT,PERRAL Supple Neck,No JVD, No cervical lymphadenopathy appriciated.  Symmetrical Chest wall movement, Good air movement bilaterally, +  rales RRR,No Gallops,Rubs or new Murmurs, No Parasternal Heave +ve B.Sounds, Abd Soft, No tenderness, No organomegaly appriciated, No rebound - guarding or rigidity. No Cyanosis, Clubbing , 1+ leg edema, No new Rash or bruise    Data Review:   Micro Results No results found for this or any previous visit (from the past 240 hour(s)).  Radiology Reports Dg Chest 2 View  01/02/2015  CLINICAL DATA:  Left-sided chest pressure since yesterday. EXAM: CHEST  2 VIEW COMPARISON:  10/15/2013 FINDINGS: Dual lead cardiac pacemaker is stable. Cardiomediastinal silhouette is enlarged. Mediastinal contours appear intact. There is coarsening of the interstitial markings. Airspace consolidation is seen within the right lower lobe. There is a small right pleural effusion. No evidence of pneumothorax or left pleural effusion. Osseous structures are without acute abnormality. Soft tissues are grossly normal. IMPRESSION: Enlarged cardiac silhouette with interstitial pulmonary edema. Small right pleural effusion with right basilar atelectasis versus airspace consolidation. Electronically Signed   By: Ted Mcalpine M.D.   On: 01/02/2015 12:21   Dg Chest Port 1 View  01/04/2015  CLINICAL DATA:  Cough, shortness of Breath EXAM: PORTABLE CHEST 1 VIEW COMPARISON:  01/02/2015 and 10/15/2013 FINDINGS: Cardiomegaly again noted. Dual lead cardiac pacemaker is unchanged in position. Degenerative changes bilateral shoulders again noted. Residual mild interstitial prominence bilaterally with improvement from prior exam. Findings probable due to residual mild edema or pneumonitis. No superimposed segmental infiltrate IMPRESSION: Residual mild interstitial prominence bilaterally with improvement from prior exam. Findings probable due to residual mild edema or pneumonitis. No superimposed segmental infiltrate. Dual lead cardiac pacemaker is unchanged in position Electronically Signed   By: Natasha Mead M.D.   On: 01/04/2015 08:52      CBC  Recent Labs Lab 01/02/15 1155  WBC 6.8  HGB 12.4  HCT 38.6  PLT 168  MCV 91.9  MCH 29.5  MCHC 32.1  RDW 13.8    Chemistries   Recent Labs Lab 01/02/15 1155 01/03/15 0358 01/04/15 0530  NA 142 142 142  K 4.4 3.8 3.9  CL 109 106 104  CO2 GLUCOSE 202* 135* 145*  BUN CREATININE 0.60 0.70 0.77  CALCIUM 10.7* 10.3 10.5*  MG  --  2.0 2.0   ------------------------------------------------------------------------------------------------------------------ estimated creatinine clearance is 53.2 mL/min (by C-G formula based on Cr of 0.77). ------------------------------------------------------------------------------------------------------------------ No results for input(s): HGBA1C in the last 72 hours. ------------------------------------------------------------------------------------------------------------------ No results for input(s): CHOL, HDL, LDLCALC, TRIG, CHOLHDL, LDLDIRECT in the last 72 hours. ------------------------------------------------------------------------------------------------------------------  Recent  Labs  01/02/15 2242  TSH 1.793   ------------------------------------------------------------------------------------------------------------------ No results for input(s): VITAMINB12, FOLATE, FERRITIN, TIBC, IRON, RETICCTPCT in the last 72 hours.  Coagulation profile No results for input(s): INR, PROTIME in the last 168 hours.  No results for input(s): DDIMER in the last 72 hours.  Cardiac Enzymes No results for input(s): CKMB, TROPONINI, MYOGLOBIN in the last 168 hours.  Invalid input(s): CK ------------------------------------------------------------------------------------------------------------------ Invalid input(s): POCBNP   Time Spent in minutes  35   Aries Townley K M.D on 01/04/2015 at 10:21 AM  Between 7am to 7pm - Pager - [phone]  After 7pm go to [email or web address] - password [facility]  [facility] -  Office  [phone]

## 2015-01-04 NOTE — Progress Notes (Signed)
Patient is having chest pain this morning while MD in room. EKG completed and MD reviewed. Vitals completed. Nitro SL given x3; chest pain has decreased significantly. CXR completed. Will continue to monitor.

## 2015-01-05 LAB — BASIC METABOLIC PANEL
Anion gap: 9 (ref 5–15)
BUN: 20 mg/dL (ref 6–20)
CALCIUM: 10.2 mg/dL (ref 8.9–10.3)
CO2: 31 mmol/L (ref 22–32)
CREATININE: 0.87 mg/dL (ref 0.44–1.00)
Chloride: 102 mmol/L (ref 101–111)
GFR calc non Af Amer: 59 mL/min — ABNORMAL LOW (ref >60)
Glucose, Bld: 148 mg/dL — ABNORMAL HIGH (ref 65–99)
Potassium: 4 mmol/L (ref 3.5–5.1)
SODIUM: 142 mmol/L (ref 135–145)

## 2015-01-05 LAB — GLUCOSE, CAPILLARY
Glucose-Capillary: 136 mg/dL — ABNORMAL HIGH (ref 65–99)
Glucose-Capillary: 196 mg/dL — ABNORMAL HIGH (ref 65–99)

## 2015-01-05 LAB — TROPONIN I

## 2015-01-05 LAB — HEMOGLOBIN A1C
HEMOGLOBIN A1C: 7.2 % — AB (ref 4.8–5.6)
MEAN PLASMA GLUCOSE: 160 mg/dL

## 2015-01-05 MED ORDER — METOLAZONE 2.5 MG PO TABS
2.5000 mg | ORAL_TABLET | Freq: Once | ORAL | Status: AC
  Administered 2015-01-05: 2.5 mg via ORAL
  Filled 2015-01-05: qty 1

## 2015-01-05 MED ORDER — ISOSORBIDE MONONITRATE ER 30 MG PO TB24: 30.0000 mg | ORAL_TABLET | Freq: Every day | ORAL | Status: DC

## 2015-01-05 MED ORDER — POTASSIUM CHLORIDE ER 20 MEQ PO TBCR: 20.0000 meq | EXTENDED_RELEASE_TABLET | Freq: Every day | ORAL | Status: DC

## 2015-01-05 MED ORDER — IRBESARTAN 75 MG PO TABS: 37.5000 mg | ORAL_TABLET | Freq: Every day | ORAL | Status: DC

## 2015-01-05 MED ORDER — FUROSEMIDE 40 MG PO TABS: 40.0000 mg | ORAL_TABLET | Freq: Two times a day (BID) | ORAL | Status: DC

## 2015-01-05 NOTE — Discharge Summary (Signed)
Brittany [name], is a 81 y.o. female  DOB 1927/03/20  MRN [id number].  Admission date:  01/02/2015  Admitting Physician  Leroy Sea, MD  Discharge Date:  01/05/2015   Primary MD  Josue Hector, MD  Recommendations for primary care physician for things to follow:   Monitor weight, BMP, diuretic dose closely   Admission Diagnosis  CHF (congestive heart failure), NYHA class I, unspecified failure chronicity, systolic (HCC) [I50.20]   Discharge Diagnosis  CHF (congestive heart failure), NYHA class I, unspecified failure chronicity, systolic (HCC) [I50.20]    Principal Problem:   Acute on chronic diastolic CHF (congestive heart failure), NYHA class 1 (HCC) Active Problems:   HYPERLIPIDEMIA   LAD-balloon angioplasty/cutting balloon-2007   AV BLOCK, COMPLETE   SOB (shortness of breath)   DM2 (diabetes mellitus, type 2) (HCC)      Past Medical History  Diagnosis Date  . CAD (coronary artery disease)   . Pacemaker   . AV block   . Hyperlipidemia   . Hypertension   . Diabetes mellitus     Type II  . Glaucoma     Past Surgical History  Procedure Laterality Date  . Tonsillectomy  1937  . Breast biopsy  1991  . Left total knee replacement  1996  . Pacemaker insertion      [employer]  . Femur im nail  01/18/2012    Procedure: INTRAMEDULLARY (IM) NAIL FEMORAL;  Surgeon: Senaida Lange, MD;  Location: [facility] OR;  Service: Orthopedics;  Laterality: Left;       HPI  from the history and physical done on the day of admission:   Brittany [name] is a 81 y.o. female, with H/O CAD post angioplasty in 2007, AV block needing Pacemaker ( Dr Graciela Husbands), DM2, HTN, Obesity, comes to the hospital with few day h/o gradually progressive exertional SOB, orthopnea, leg edema, weight gain and frothy cough, no fever, no  chest pain, no exposure to sick contacts, no recent travel.  In the ER workup showed CHF and I was called to admit.     Hospital Course:     1. Acute on Chr Diastolic CHF last EF 60% 10 yrs ago - Much better after Lasix,NTG and B Blocker, down 5 lits, SOB resolved, lungs clean, TTE noted, place on diuretics, fluid & salt restriction, follow with Cards and PCP closely..  2.H/O Pacemaker - no acute issues, follow with Cards outpt.  3.H/O CAD - post angioplasty in 2007 - stable, ASA and Coreg for secondary prevention.  4.DM2 - check A1c, continue Glucophage.  Lab Results  Component Value Date   HGBA1C 7.2* 01/02/2015    CBG (last 3)   Recent Labs  01/04/15 1618 01/04/15 2212 01/05/15 0535  GLUCAP 159* 144* 136*      5.HTN - on ARB, Coreg added Imdur and monitor.       Discharge Condition: Stable  Follow UP  Follow-up Information    Follow up with Josue Hector, MD. Schedule an appointment as soon as  possible for a visit in 3 days.   Specialty:  Family Medicine   Contact information:   [address] [place] [address] [phone]       Follow up with Sherryl Manges, MD. Schedule an appointment as soon as possible for a visit in 3 days.   Specialty:  Cardiology   Contact information:   [address] [place] [address] [phone]        Consults obtained - None  Diet and Activity recommendation: See Discharge Instructions below  Discharge Instructions       Discharge Instructions    Discharge instructions    Complete by:  As directed   Follow with Primary MD Josue Hector, MD in 3 days   Get CBC, CMP, 2 view Chest X ray checked  by Primary MD next visit.    Activity: As tolerated with Full fall precautions use walker/cane & assistance as needed   Disposition Home     Diet:   Heart Healthy - Check your Weight same time everyday, if you gain over 2 pounds, or you develop in leg swelling, experience  more shortness of breath or chest pain, call your Primary MD immediately. Follow Cardiac Low Salt Diet and 1.5 lit/day fluid restriction.   On your next visit with your primary care physician please Get Medicines reviewed and adjusted.   Please request your Prim.MD to go over all Hospital Tests and Procedure/Radiological results at the follow up, please get all Hospital records sent to your Prim MD by signing hospital release before you go home.   If you experience worsening of your admission symptoms, develop shortness of breath, life threatening emergency, suicidal or homicidal thoughts you must seek medical attention immediately by calling 911 or calling your MD immediately  if symptoms less severe.  You Must read complete instructions/literature along with all the possible adverse reactions/side effects for all the Medicines you take and that have been prescribed to you. Take any new Medicines after you have completely understood and accpet all the possible adverse reactions/side effects.   Do not drive, operating heavy machinery, perform activities at heights, swimming or participation in water activities or provide baby sitting services if your were admitted for syncope or siezures until you have seen by Primary MD or a Neurologist and advised to do so again.  Do not drive when taking Pain medications.    Do not take more than prescribed Pain, Sleep and Anxiety Medications  Special Instructions: If you have smoked or chewed Tobacco  in the last 2 yrs please stop smoking, stop any regular Alcohol  and or any Recreational drug use.  Wear Seat belts while driving.   Please note  You were cared for by a hospitalist during your hospital stay. If you have any questions about your discharge medications or the care you received while you were in the hospital after you are discharged, you can call the unit and asked to speak with the hospitalist on call if the hospitalist that took care of  you is not available. Once you are discharged, your primary care physician will handle any further medical issues. Please note that NO REFILLS for any discharge medications will be authorized once you are discharged, as it is imperative that you return to your primary care physician (or establish a relationship with a primary care physician if you do not have one) for your aftercare needs so that they can reassess your need for medications and monitor your lab  values.     Increase activity slowly    Complete by:  As directed              Discharge Medications       Medication List    STOP taking these medications        valsartan-hydrochlorothiazide 160-12.5 MG tablet  Commonly known as:  DIOVAN-HCT      TAKE these medications        aspirin 81 MG tablet  Take 81 mg by mouth at bedtime.     carvedilol 25 MG tablet  Commonly known as:  COREG  TAKE  (1)  TABLET TWICE A DAY WITH MEALS (BREAKFAST AND SUPPER)     EQL COQ10 300 MG Caps  Generic drug:  Coenzyme Q10  Take 200 mg by mouth daily.     furosemide 40 MG tablet  Commonly known as:  LASIX  Take 1 tablet (40 mg total) by mouth 2 (two) times daily.     irbesartan 75 MG tablet  Commonly known as:  AVAPRO  Take 0.5 tablets (37.5 mg total) by mouth daily.     isosorbide mononitrate 30 MG 24 hr tablet  Commonly known as:  IMDUR  Take 1 tablet (30 mg total) by mouth daily.     MEGARED OMEGA-3 KRILL OIL 500 MG Caps  Take 1 tablet by mouth daily.     metFORMIN 500 MG 24 hr tablet  Commonly known as:  GLUCOPHAGE-XR  Take 1,000 mg by mouth 2 (two) times daily.     multivitamin tablet  Take 1 tablet by mouth daily. CENTRUM SILVER     OMEGA-3 COMPLEX PO  Take 1 tablet by mouth 3 (three) times daily.     Potassium Chloride ER 20 MEQ Tbcr  Take 20 mEq by mouth daily.        Major procedures and Radiology Reports - PLEASE review detailed and final reports for all details, in brief -   TTE  Left ventricle: The  cavity size was normal. Wall thickness was increased in a pattern of mild LVH. Systolic function was normal. The estimated ejection fraction was in the range of 55% to 60%. Wall motion was normal; there were no regional wall motionabnormalities. - Aortic valve: There was trivial regurgitation. - Mitral valve: Calcified annulus. There was mild regurgitation. - Left atrium: The atrium was moderately dilated. - Right atrium: The atrium was mildly dilated. - Pulmonary arteries: PA peak pressure: 53 mm Hg (S).   Dg Chest 2 View  01/02/2015  CLINICAL DATA:  Left-sided chest pressure since yesterday. EXAM: CHEST  2 VIEW COMPARISON:  10/15/2013 FINDINGS: Dual lead cardiac pacemaker is stable. Cardiomediastinal silhouette is enlarged. Mediastinal contours appear intact. There is coarsening of the interstitial markings. Airspace consolidation is seen within the right lower lobe. There is a small right pleural effusion. No evidence of pneumothorax or left pleural effusion. Osseous structures are without acute abnormality. Soft tissues are grossly normal. IMPRESSION: Enlarged cardiac silhouette with interstitial pulmonary edema. Small right pleural effusion with right basilar atelectasis versus airspace consolidation. Electronically Signed   By: Ted Mcalpineobrinka  Dimitrova M.D.   On: 01/02/2015 12:21   Dg Chest Port 1 View  01/04/2015  CLINICAL DATA:  Cough, shortness of Breath EXAM: PORTABLE CHEST 1 VIEW COMPARISON:  01/02/2015 and 10/15/2013 FINDINGS: Cardiomegaly again noted. Dual lead cardiac pacemaker is unchanged in position. Degenerative changes bilateral shoulders again noted. Residual mild interstitial prominence bilaterally with improvement from prior exam. Findings probable due to  residual mild edema or pneumonitis. No superimposed segmental infiltrate IMPRESSION: Residual mild interstitial prominence bilaterally with improvement from prior exam. Findings probable due to residual mild edema or pneumonitis. No  superimposed segmental infiltrate. Dual lead cardiac pacemaker is unchanged in position Electronically Signed   By: Natasha Mead M.D.   On: 01/04/2015 08:52    Micro Results      No results found for this or any previous visit (from the past 240 hour(s)).  Today   Subjective    Brittany [name] today has no headache,no chest abdominal pain,no new weakness tingling or numbness, feels much better wants to go home today.     Objective   Blood pressure 121/73, pulse 63, temperature 98.2 F (36.8 C), temperature source Oral, resp. rate 18, height  (1.626 m), weight 82.7 kg (182 lb 5.1 oz), SpO2 96 %.   Intake/Output Summary (Last 24 hours) at 01/05/15 0930 Last data filed at 01/05/15 0906  Gross per 24 hour  Intake    960 ml  Output   1550 ml  Net   -590 ml    Exam Awake Alert, Oriented x 3, No new F.N deficits, Normal affect [place].AT,PERRAL Supple Neck,No JVD, No cervical lymphadenopathy appriciated.  Symmetrical Chest wall movement, Good air movement bilaterally, CTAB RRR,No Gallops,Rubs or new Murmurs, No Parasternal Heave +ve B.Sounds, Abd Soft, Non tender, No organomegaly appriciated, No rebound -guarding or rigidity. No Cyanosis, Clubbing , 1+ edema, No new Rash or bruise   Data Review   CBC w Diff: Lab Results  Component Value Date   WBC 6.8 01/02/2015   HGB 12.4 01/02/2015   HCT 38.6 01/02/2015   PLT 168 01/02/2015   LYMPHOPCT 31 10/21/2012   MONOPCT 13* 10/21/2012   EOSPCT 2 10/21/2012   BASOPCT 0 10/21/2012    CMP: Lab Results  Component Value Date   NA 142 01/05/2015   K 4.0 01/05/2015   CL 102 01/05/2015   CO2 31 01/05/2015   BUN 20 01/05/2015   CREATININE 0.87 01/05/2015   PROT 7.3 10/21/2012   ALBUMIN 3.9 10/21/2012   BILITOT 0.2* 10/21/2012   ALKPHOS 61 10/21/2012   AST 14 10/21/2012   ALT 13 10/21/2012  .   Total Time in preparing paper work, data evaluation and todays exam - 35 minutes  Leroy Sea M.D on 01/05/2015 at 9:30  AM  [facility]   Office  [phone]

## 2015-01-05 NOTE — Discharge Instructions (Signed)
Follow with Primary MD Josue HectorNYLAND,LEONARD ROBERT, MD in 3 days   Get CBC, CMP, 2 view Chest X ray checked  by Primary MD next visit.    Activity: As tolerated with Full fall precautions use walker/cane & assistance as needed   Disposition Home     Diet:   Heart Healthy - Check your Weight same time everyday, if you gain over 2 pounds, or you develop in leg swelling, experience more shortness of breath or chest pain, call your Primary MD immediately. Follow Cardiac Low Salt Diet and 1.5 lit/day fluid restriction.   On your next visit with your primary care physician please Get Medicines reviewed and adjusted.   Please request your Prim.MD to go over all Hospital Tests and Procedure/Radiological results at the follow up, please get all Hospital records sent to your Prim MD by signing hospital release before you go home.   If you experience worsening of your admission symptoms, develop shortness of breath, life threatening emergency, suicidal or homicidal thoughts you must seek medical attention immediately by calling 911 or calling your MD immediately  if symptoms less severe.  You Must read complete instructions/literature along with all the possible adverse reactions/side effects for all the Medicines you take and that have been prescribed to you. Take any new Medicines after you have completely understood and accpet all the possible adverse reactions/side effects.   Do not drive, operating heavy machinery, perform activities at heights, swimming or participation in water activities or provide baby sitting services if your were admitted for syncope or siezures until you have seen by Primary MD or a Neurologist and advised to do so again.  Do not drive when taking Pain medications.    Do not take more than prescribed Pain, Sleep and Anxiety Medications  Special Instructions: If you have smoked or chewed Tobacco  in the last 2 yrs please stop smoking, stop any regular Alcohol  and or any  Recreational drug use.  Wear Seat belts while driving.   Please note  You were cared for by a hospitalist during your hospital stay. If you have any questions about your discharge medications or the care you received while you were in the hospital after you are discharged, you can call the unit and asked to speak with the hospitalist on call if the hospitalist that took care of you is not available. Once you are discharged, your primary care physician will handle any further medical issues. Please note that NO REFILLS for any discharge medications will be authorized once you are discharged, as it is imperative that you return to your primary care physician (or establish a relationship with a primary care physician if you do not have one) for your aftercare needs so that they can reassess your need for medications and monitor your lab values.

## 2015-01-05 NOTE — Progress Notes (Signed)
Pt has orders to be discharged. Discharge instructions given and pt has no additional questions at this time. Medication regimen reviewed and pt educated. Pt verbalized understanding and has no additional questions. Telemetry box removed. IV removed and site in good condition. Pt stable and waiting for transportation. 

## 2015-01-05 NOTE — Care Management Important Message (Signed)
Important Message  Patient Details  Name: Clyde LundborgMarlyn M Lias MRN: [id number] Date of Birth: [birth year]   Medicare Important Message Given:  Yes    Kyla BalzarineShealy, Josselin Gaulin Abena 01/05/2015, 12:16 PM

## 2015-01-06 ENCOUNTER — Telehealth: Payer: Self-pay | Admitting: Internal Medicine

## 2015-01-06 ENCOUNTER — Encounter: Payer: Self-pay | Admitting: Physician Assistant

## 2015-01-06 NOTE — Progress Notes (Signed)
Cardiology Office Note Date:  01/07/2015  Patient ID:  Brittany, [name] 1927/06/16, MRN [id number] PCP:  Josue Hector, MD  Electrophysiologist: Dr. Graciela Husbands   Chief Complaint: s/p hospital stay 01/02/15  History of Present Illness: Brittany [name] is a 81 y.o. female with history of PPM, HTN, HLD, DM and CAD with last intervention PTCA in 2007, comes to the office after a hospital stay for CHF, discharged 01/05/15 after diuresis and improvement in her symptoms.  Hospital record reviewed Neg Trop x5, p.BNP 232, CXR with pulmonary edema Echo as below EKGs reviewed noting AFib/flutter  Since then she feels better, her edema remains resolved.  She mentions that she has had a constant aching sensation in her chest that wraps around to her back, it is always there, unchanged by position or exertion, and is reproducible/increased with palpation of her chest wall and back increases this pain.  for 2-3 weeks.  She is back to her baseline exertional capacity.  She denies any palpitations, no dizziness, near syncope or syncope.  Past Medical History  Diagnosis Date  . CAD (coronary artery disease)   . Pacemaker     STJ pacer, Dr. Ladona Ridgel, 2006  . AV block   . Hyperlipidemia   . Hypertension   . Diabetes mellitus     Type II  . Glaucoma     Past Surgical History  Procedure Laterality Date  . Tonsillectomy  1937  . Breast biopsy  1991  . Left total knee replacement  1996  . Pacemaker insertion      St. Jude  . Femur im nail  01/18/2012    Procedure: INTRAMEDULLARY (IM) NAIL FEMORAL;  Surgeon: Senaida Lange, MD;  Location: [facility] OR;  Service: Orthopedics;  Laterality: Left;    Current Outpatient Prescriptions  Medication Sig Dispense Refill  . aspirin 81 MG tablet Take 81 mg by mouth at bedtime.    . carvedilol (COREG) 25 MG tablet TAKE  (1)  TABLET TWICE A DAY WITH MEALS (BREAKFAST AND SUPPER) 60 tablet 4  . Coenzyme Q10 (EQL COQ10) 300 MG CAPS Take 200 mg by mouth daily.      . furosemide (LASIX) 40 MG tablet Take 1 tablet (40 mg total) by mouth 2 (two) times daily. 60 tablet 0  . irbesartan (AVAPRO) 75 MG tablet Take 0.5 tablets (37.5 mg total) by mouth daily. 30 tablet 0  . isosorbide mononitrate (IMDUR) 30 MG 24 hr tablet Take 1 tablet (30 mg total) by mouth daily. 30 tablet 0  . MEGARED OMEGA-3 KRILL OIL 500 MG CAPS Take 1 tablet by mouth daily.    . metFORMIN (GLUCOPHAGE-XR) 500 MG 24 hr tablet Take 1,000 mg by mouth 2 (two) times daily.    . Multiple Vitamin (MULTIVITAMIN) tablet Take 1 tablet by mouth daily. CENTRUM SILVER    . potassium chloride 20 MEQ TBCR Take 20 mEq by mouth daily. 30 tablet 0   No current facility-administered medications for this visit.    Allergies:   Ciprofloxacin; Atorvastatin; Cedax; Plavix; Statins; and Ace inhibitors   Social History:  The patient  reports that she quit smoking about 64 years ago. Her smoking use included Cigarettes. She has never used smokeless tobacco. She reports that she drinks alcohol. She reports that she does not use illicit drugs.   Family History:  The patient reports her parents and siblings were healthy, her father died of a stroke in his old age.    ROS:  Please see  the history of present illness. .   All other systems are reviewed and otherwise negative.   PHYSICAL EXAM:  VS:  BP 124/62 mmHg  Pulse 64  Ht  (1.626 m)  Wt 185 lb (83.915 kg)  BMI 31.74 kg/m2 BMI: Body mass index is 31.74 kg/(m^2). Well nourished, well developed, in no acute distress HEENT: normocephalic, atraumatic Neck: no JVD, carotid bruits or masses Cardiac:  normal S1, S2; RRR (paced); no significant murmurs, no rubs, or gallops Lungs:  clear to auscultation bilaterally, no wheezing, rhonchi or rales Abd: soft, nontender MS: no deformity or atrophy Ext: trace edema Skin: warm and dry, no rash Neuro:  No gross deficits appreciated Psych: euthymic mood, full affect  PPM site is stable, no tethering or  discomfort   EKG:  Done today shows Afib/flutter, V pacing at 50 PPM check today shows mode switch to VVI 50 since about Sept, battery estimate is approx 6 months  01/03/15 Echocardiogram Study Conclusions  Left ventricle: The cavity size was normal. Wall thickness was increased in a pattern of mild LVH. Systolic function was normal. The estimated ejection fraction was in the range of 55% to 60%. Wall motion was normal; there were no regional wall motion abnormalities. - Aortic valve: There was trivial regurgitation. - Mitral valve: Calcified annulus. There was mild regurgitation. - Left atrium: The atrium was moderately dilated. - Right atrium: The atrium was mildly dilated. - Pulmonary arteries: PA peak pressure: 53 mm Hg (S).  Recent Labs: 01/02/2015: B Natriuretic Peptide 232.3*; Hemoglobin 12.4; Platelets 168; TSH 1.793 01/04/2015: Magnesium 2.0 01/05/2015: BUN 20; Creatinine, Ser 0.87; Potassium 4.0; Sodium 142  No results found for requested labs within last 365 days.   Estimated Creatinine Clearance: 49.6 mL/min (by C-G formula based on Cr of 0.87).   Wt Readings from Last 3 Encounters:  01/07/15 185 lb (83.915 kg)  01/05/15 182 lb 5.1 oz (82.7 kg)  01/01/14 185 lb 6.4 oz (84.097 kg)     Other studies reviewed: Additional studies/records reviewed today include: summarized above  DEVICE information:  STJ PPM implanted 2006 with Dr. Ladona Ridgel  ASSESSMENT AND PLAN:  1. Recent hospital stay with CHF     appears compensated by exam/lack of symptoms     Counseled on daily weights, minimzing sodium intake, and notify if she notes rapid weight gain/recurrent symptoms     ? Secondary to AF      2. PPM     PACER DEPENDENT     Device clinic check in 3 mo, battery with approx 6 months to ERI  3. CAD cutting balloon angioplasty to mid LAD (80%>>10%) in 2007 (negative adenosine Myoview 2010, LVEF 64%) No CP On ASA, BB, she reports an intolerance or allergy to  statins, hx of myalgia with atorvastatin  4. HTN Appears well controlled  5. New onset AFlutter CHADS2Vasc is at least 6 Discussed with patient and daughter embolic/stroke risk, benefits/risk of full anticoagulation.  She reports very remotely being on coumadin and stopped she thinks due to significant bruising, she cannot recall why she was on it.  She walks with the aid of a walker and reports steady without falls using it.  She is reluctant to start full a/c and would like to discuss it with her other daughter as well, give it some more thought.  6. CP/back pain Appears musculoskeletal/reproducible  Disposition: F/u with Dr. Graciela Husbands at her scheduled visit, 01/17/14 to re-evaluate and re-discuss anticoagulation.  Current medicines are reviewed at length  with the patient today.  The patient did not have any concerns regarding medicines.  Judith BlonderSigned, Umaima Scholten Ursy, PA-C 01/07/2015 8:12 AM     [facility] [address] [place] [address] [phone] (office)  [phone] (fax)

## 2015-01-06 NOTE — Telephone Encounter (Signed)
Brittany LandsbergRenee has an 8:00 am opening tomorrow.  Will she come to that? Otherwise 8:30 am on 01/13/15 with SK would be the only other thing.

## 2015-01-06 NOTE — Telephone Encounter (Signed)
Pt was seen in the ER for SOB and hadsome "aching" under left breast near pacemaker-told to see Graciela HusbandsKlein asap-she already had an appt 01-18-15 but she thinks she needs a sooner appt-work in spots are not available-pls advise

## 2015-01-07 ENCOUNTER — Encounter: Payer: Self-pay | Admitting: Physician Assistant

## 2015-01-07 ENCOUNTER — Encounter: Payer: Self-pay | Admitting: Internal Medicine

## 2015-01-07 ENCOUNTER — Ambulatory Visit (INDEPENDENT_AMBULATORY_CARE_PROVIDER_SITE_OTHER): Payer: Medicare Other | Admitting: Physician Assistant

## 2015-01-07 VITALS — BP 124/62 | HR 64 | Ht 64.0 in | Wt 185.0 lb

## 2015-01-07 DIAGNOSIS — I10 Essential (primary) hypertension: Secondary | ICD-10-CM | POA: Diagnosis not present

## 2015-01-07 DIAGNOSIS — I480 Paroxysmal atrial fibrillation: Secondary | ICD-10-CM | POA: Diagnosis not present

## 2015-01-07 DIAGNOSIS — I2510 Atherosclerotic heart disease of native coronary artery without angina pectoris: Secondary | ICD-10-CM

## 2015-01-07 DIAGNOSIS — I509 Heart failure, unspecified: Secondary | ICD-10-CM

## 2015-01-07 DIAGNOSIS — I442 Atrioventricular block, complete: Secondary | ICD-10-CM

## 2015-01-07 NOTE — Patient Instructions (Addendum)
Medication Instructions:   CONTINUE SAME MEDICATIONS    If you need a refill on your cardiac medications before your next appointment, please call your pharmacy.  Labwork: NONE ORDER TODAY   Testing/Procedures: NONE ORDER TODAY    Follow-Up:   IN [facility] merchand[facility] PACER CHECK WITH DEVICE CLINIC IN 3 MONTHS     Any Other Special Instructions Will Be Listed Below (If Applicable).

## 2015-01-13 ENCOUNTER — Telehealth: Payer: Self-pay | Admitting: Internal Medicine

## 2015-01-13 NOTE — Telephone Encounter (Signed)
New message      Calling to confirm diagnosis of CHF.  She has the ejection fraction

## 2015-01-14 LAB — CUP PACEART INCLINIC DEVICE CHECK
Implantable Lead Implant Date: 20061229
Implantable Lead Location: 753859
Implantable Lead Model: 5076
Lead Channel Setting Pacing Amplitude: 2 V
Lead Channel Setting Sensing Sensitivity: 2 mV
MDC IDC LEAD IMPLANT DT: 20061229
MDC IDC LEAD LOCATION: 753860
MDC IDC PG SERIAL: 1643955
MDC IDC SESS DTM: 20170105123257
MDC IDC SET LEADCHNL RV PACING PULSEWIDTH: 0.5 ms

## 2015-01-15 NOTE — Telephone Encounter (Signed)
[facility] as I have no extension for her.

## 2015-01-18 ENCOUNTER — Encounter: Payer: Medicare Other | Admitting: Internal Medicine

## 2015-01-18 ENCOUNTER — Encounter: Payer: Self-pay | Admitting: Internal Medicine

## 2015-01-18 ENCOUNTER — Ambulatory Visit (INDEPENDENT_AMBULATORY_CARE_PROVIDER_SITE_OTHER): Payer: Medicare Other | Admitting: Internal Medicine

## 2015-01-18 VITALS — BP 122/60 | HR 50 | Ht 61.0 in | Wt 188.0 lb

## 2015-01-18 DIAGNOSIS — I5031 Acute diastolic (congestive) heart failure: Secondary | ICD-10-CM | POA: Diagnosis not present

## 2015-01-18 DIAGNOSIS — I442 Atrioventricular block, complete: Secondary | ICD-10-CM | POA: Diagnosis not present

## 2015-01-18 DIAGNOSIS — M79603 Pain in arm, unspecified: Secondary | ICD-10-CM | POA: Diagnosis not present

## 2015-01-18 MED ORDER — APIXABAN 5 MG PO TABS: 5.0000 mg | ORAL_TABLET | Freq: Two times a day (BID) | ORAL | Status: DC

## 2015-01-18 NOTE — Progress Notes (Signed)
Patient Care Team: Joette CatchingLeonard Nyland, MD as PCP - General (Family Medicine) Duke SalviaSteven C Klein, MD (Cardiology)   HPI  Brittany [name] is a 82 y.o. female is seen in followup for complete heart block she is status post pacemaker implantation  she is having no problem related to this.  She has a history of prior LAD POBA with normal left ventricular function.   The patient was hospitalized recently for congestive heart failure. It was noted at that time that she was in atrial flutter. She saw RU PA about a week and a half ago and she declined at that point anticoagulation.   she continues to struggle with some shortness of breath although it is  A little bit better.   She also has some atypical L arm pain, lasting up to an hour or so and unrelated to eertion.  It mostly is in her shoulder and into her back    Echocardiogram 12/16 demonstrated normal LV function and moderate-severe LAE (45/22.2/45)  Past Medical History  Diagnosis Date  . CAD (coronary artery disease)   . Pacemaker     STJ pacer, Dr. Ladona Ridgelaylor, 2006  . AV block   . Hyperlipidemia   . Hypertension   . Diabetes mellitus     Type II  . Glaucoma     Past Surgical History  Procedure Laterality Date  . Tonsillectomy  1937  . Breast biopsy  1991  . Left total knee replacement  1996  . Pacemaker insertion      St. Jude  . Femur im nail  01/18/2012    Procedure: INTRAMEDULLARY (IM) NAIL FEMORAL;  Surgeon: Senaida LangeKevin M Supple, MD;  Location: [facility] OR;  Service: Orthopedics;  Laterality: Left;    Current Outpatient Prescriptions  Medication Sig Dispense Refill  . aspirin 81 MG tablet Take 81 mg by mouth at bedtime.    . carvedilol (COREG) 25 MG tablet TAKE  (1)  TABLET TWICE A DAY WITH MEALS (BREAKFAST AND SUPPER) 60 tablet 4  . Coenzyme Q10 (EQL COQ10) 300 MG CAPS Take 200 mg by mouth daily.     . furosemide (LASIX) 40 MG tablet Take 1 tablet (40 mg total) by mouth 2 (two) times daily. 60 tablet 0  . irbesartan (AVAPRO)  75 MG tablet Take 0.5 tablets (37.5 mg total) by mouth daily. 30 tablet 0  . isosorbide mononitrate (IMDUR) 30 MG 24 hr tablet Take 1 tablet (30 mg total) by mouth daily. 30 tablet 0  . MEGARED OMEGA-3 KRILL OIL 500 MG CAPS Take 1 tablet by mouth daily.    . metFORMIN (GLUCOPHAGE-XR) 500 MG 24 hr tablet Take 1,000 mg by mouth 2 (two) times daily.    . Multiple Vitamin (MULTIVITAMIN) tablet Take 1 tablet by mouth daily. CENTRUM SILVER    . potassium chloride 20 MEQ TBCR Take 20 mEq by mouth daily. 30 tablet 0   No current facility-administered medications for this visit.    Allergies  Allergen Reactions  . Ciprofloxacin Other (See Comments)    Severe chest pains  . Atorvastatin Other (See Comments)    REACTION: Myalgia  . Cedax [Ceftibuten] Other (See Comments)    Possibly nausea/vomiting, cyanosis, severe headaches  . Plavix [Clopidogrel Bisulfate] Other (See Comments)    Possibly nausea/vomiting, cyanosis, severe headaches    . Statins Other (See Comments)    Myalgias, cyanosis  . Ace Inhibitors Other (See Comments)    REACTION: cough and sore throat  Review of Systems negative except from HPI and PMH  Physical Exam BP 122/60 mmHg  Pulse 50  Ht 5' 1" (1.549 m)  Wt 188 lb (85.276 kg)  BMI 35.54 kg/m2 Well developed and well nourished in no acute distress HENT normal E scleral and icterus clear Neck Supple JVP<10; carotids brisk and full Clear to ausculation Device pocket well healed; without hematoma or erythema.  There is no tethering Regular rate and rhythm, no murmurs gallops or rub Soft with active bowel sounds No clubbing cyanosis 2+R>L   Edema Alert and oriented, grossly normal motor and sensory function Skin Warm and Dry  ECG demonstrates  Ventricular pacing with underlying atrial flutter; flutter waves are described below  Assessment and  Plan  Hypertension much improved  Complete heart block  Pacemaker-St. Jude The patient's device was interrogated.   The information was reviewed. No changes were made in the programming.    Coronary disease with prior LAD POBA Without symptoms of ischemia  Atrial flutter  Chest/LA pain    the patient has had worsening shortness of breath and heart failure temporally related to the development of atrial flutter by electrocardiogram. He relatively typical with negative flutter waves with low amplitude in the inferior leads an upright flutter waves in lead V1.    We have discussed strategies including catheter ablation as primary N the issues of recurrence of atrial fibrillation as a downside. We will proceed with catheter ablation following 3 weeks of antecedent anticoagulation.  We discussed the use of the NOACs compared to Coumadin. We briefly reviewed the data of at least comparability in stroke prevention, bleeding and outcome. We discussed some of the new once wherein somewhat associated with decreased ischemic stroke risk, one to be taken daily, and has been shown to be comparable and bleeding risk to aspirin.  We also discussed bleeding associated with warfarin as well as NOACs and a wall bleeding as a complication of all these drugs intracranial bleeding is more frequently associated with warfarin then the NOACs and a GI bleeding is more commonly associated with the latter   I am not sure as to the cause of her chest and left arm pain. It certainly does not sound cardiac but with her history of coronary disease we will undertake Myoview scanning to exclude significant progression of coronary disease. If this is unrevealing we will check a sedimentation rate to exclude  PMR   She is volume overloaded. We will increase her diuretics from 40 twice a day---80 twice a day 4 days;  renal function was normal a couple of weeks ago  Her device is approaching ERI  

## 2015-01-18 NOTE — Patient Instructions (Addendum)
Medication Instructions: 1) Stop aspirin 2) Start Elqius 5 mg one tablet by mouth twice daily 3) Increase lasix to 40 mg two tablets by mouth twice daily x 4 days  Labwork: - Your physician recommends that you return for lab work : CBC/ BMP/ ESR & a nurse visit (EKG) - 02/10/15  Procedures/Testing: - Your physician has requested that you have a lexiscan myoview. For further information please visit [email or web address]. Please follow instruction sheet, as given.  - Your physician has recommended that you have an atrial flutter ablation- Friday 02/12/15 at 7:30 am. Catheter ablation is a medical procedure used to treat some cardiac arrhythmias (irregular heartbeats). During catheter ablation, a long, thin, flexible tube is put into a blood vessel in your groin (upper thigh), or neck. This tube is called an ablation catheter. It is then guided to your heart through the blood vessel. Radio frequency waves destroy small areas of heart tissue where abnormal heartbeats may cause an arrhythmia to start. .- Nira Conn will mail you a copy of your instructions  Follow-Up: - Your physician recommends that you schedule a follow-up appointment in: 4 weeks from 02/12/15 with Dr. Caryl Comes (follow up ablation)   Any Additional Special Instructions Will Be Listed Below (If Applicable).

## 2015-01-20 ENCOUNTER — Telehealth (HOSPITAL_COMMUNITY): Payer: Self-pay | Admitting: *Deleted

## 2015-01-20 NOTE — Telephone Encounter (Signed)
Left message on voicemail in reference to upcoming appointment scheduled for 01/22/15. Phone number given for a call back so details instructions can be given. Sharon S Brooks ° ° °

## 2015-01-21 ENCOUNTER — Encounter: Payer: Self-pay | Admitting: *Deleted

## 2015-01-21 ENCOUNTER — Other Ambulatory Visit: Payer: Self-pay | Admitting: *Deleted

## 2015-01-21 DIAGNOSIS — M79603 Pain in arm, unspecified: Secondary | ICD-10-CM

## 2015-01-21 DIAGNOSIS — I4892 Unspecified atrial flutter: Secondary | ICD-10-CM

## 2015-01-21 DIAGNOSIS — Z01812 Encounter for preprocedural laboratory examination: Secondary | ICD-10-CM

## 2015-01-21 NOTE — Telephone Encounter (Signed)
Encounter complete. 

## 2015-01-22 ENCOUNTER — Ambulatory Visit (HOSPITAL_COMMUNITY): Payer: Medicare Other | Attending: Cardiovascular Disease

## 2015-01-22 DIAGNOSIS — E119 Type 2 diabetes mellitus without complications: Secondary | ICD-10-CM | POA: Diagnosis not present

## 2015-01-22 DIAGNOSIS — I5023 Acute on chronic systolic (congestive) heart failure: Secondary | ICD-10-CM | POA: Diagnosis not present

## 2015-01-22 DIAGNOSIS — I509 Heart failure, unspecified: Secondary | ICD-10-CM | POA: Diagnosis not present

## 2015-01-22 DIAGNOSIS — R0602 Shortness of breath: Secondary | ICD-10-CM | POA: Diagnosis not present

## 2015-01-22 DIAGNOSIS — R9439 Abnormal result of other cardiovascular function study: Secondary | ICD-10-CM | POA: Diagnosis not present

## 2015-01-22 DIAGNOSIS — M79603 Pain in arm, unspecified: Secondary | ICD-10-CM | POA: Insufficient documentation

## 2015-01-22 LAB — MYOCARDIAL PERFUSION IMAGING
CHL CUP NUCLEAR SRS: 3
CHL CUP RESTING HR STRESS: 50 {beats}/min
LV dias vol: 83 mL
LVSYSVOL: 28 mL
NUC STRESS TID: 0.96
Peak HR: 62 {beats}/min
RATE: 0.29
SDS: 6
SSS: 9

## 2015-01-22 MED ORDER — TECHNETIUM TC 99M SESTAMIBI GENERIC - CARDIOLITE
10.3000 | Freq: Once | INTRAVENOUS | Status: AC | PRN
  Administered 2015-01-22: 10 via INTRAVENOUS

## 2015-01-22 MED ORDER — TECHNETIUM TC 99M SESTAMIBI GENERIC - CARDIOLITE
30.2000 | Freq: Once | INTRAVENOUS | Status: AC | PRN
  Administered 2015-01-22: 30.2 via INTRAVENOUS

## 2015-01-22 MED ORDER — REGADENOSON 0.4 MG/5ML IV SOLN
0.4000 mg | Freq: Once | INTRAVENOUS | Status: AC
  Administered 2015-01-22: 0.4 mg via INTRAVENOUS

## 2015-02-10 ENCOUNTER — Other Ambulatory Visit: Payer: Self-pay | Admitting: Internal Medicine

## 2015-02-10 ENCOUNTER — Other Ambulatory Visit (INDEPENDENT_AMBULATORY_CARE_PROVIDER_SITE_OTHER): Payer: Medicare Other | Admitting: *Deleted

## 2015-02-10 ENCOUNTER — Ambulatory Visit (INDEPENDENT_AMBULATORY_CARE_PROVIDER_SITE_OTHER): Payer: Medicare Other

## 2015-02-10 VITALS — BP 142/70 | HR 50 | Ht 61.0 in | Wt 188.4 lb

## 2015-02-10 DIAGNOSIS — I4892 Unspecified atrial flutter: Secondary | ICD-10-CM | POA: Diagnosis not present

## 2015-02-10 DIAGNOSIS — Z01812 Encounter for preprocedural laboratory examination: Secondary | ICD-10-CM

## 2015-02-10 DIAGNOSIS — M79603 Pain in arm, unspecified: Secondary | ICD-10-CM

## 2015-02-10 DIAGNOSIS — I483 Typical atrial flutter: Secondary | ICD-10-CM

## 2015-02-10 LAB — BASIC METABOLIC PANEL
BUN: 20 mg/dL (ref 7–25)
CALCIUM: 10.9 mg/dL — AB (ref 8.6–10.4)
CO2: 27 mmol/L (ref 20–31)
Chloride: 106 mmol/L (ref 98–110)
Creat: 0.76 mg/dL (ref 0.60–0.88)
Glucose, Bld: 142 mg/dL — ABNORMAL HIGH (ref 65–99)
POTASSIUM: 3.9 mmol/L (ref 3.5–5.3)
SODIUM: 142 mmol/L (ref 135–146)

## 2015-02-10 LAB — CBC WITH DIFFERENTIAL/PLATELET
BASOS ABS: 0 10*3/uL (ref 0.0–0.1)
BASOS PCT: 0 % (ref 0–1)
Eosinophils Absolute: 0.3 10*3/uL (ref 0.0–0.7)
Eosinophils Relative: 4 % (ref 0–5)
HCT: 35.7 % — ABNORMAL LOW (ref 36.0–46.0)
HEMOGLOBIN: 11.7 g/dL — AB (ref 12.0–15.0)
LYMPHS PCT: 22 % (ref 12–46)
Lymphs Abs: 1.5 10*3/uL (ref 0.7–4.0)
MCH: 29 pg (ref 26.0–34.0)
MCHC: 32.8 g/dL (ref 30.0–36.0)
MCV: 88.6 fL (ref 78.0–100.0)
MONO ABS: 0.8 10*3/uL (ref 0.1–1.0)
MPV: 10.8 fL (ref 8.6–12.4)
Monocytes Relative: 11 % (ref 3–12)
NEUTROS ABS: 4.4 10*3/uL (ref 1.7–7.7)
NEUTROS PCT: 63 % (ref 43–77)
Platelets: 176 10*3/uL (ref 150–400)
RBC: 4.03 MIL/uL (ref 3.87–5.11)
RDW: 13.9 % (ref 11.5–15.5)
WBC: 7 10*3/uL (ref 4.0–10.5)

## 2015-02-10 LAB — SEDIMENTATION RATE: SED RATE: 6 mm/h (ref 0–30)

## 2015-02-10 NOTE — Progress Notes (Signed)
Patient in today for EKG and lab work for flutter ablation 2/3. Patient has no complaints at this time, but does appear mildly SOB on exertion (getting up to exam table). She st SOB is better than it used to be.  Confirmed medications with patient and she st she has not missed any doses. BP 142/70 and HR 50. Confirmed with patient and her daughter they have instruction letter for procedure and have no questions. Reviewed EKG with Dr. Graciela Husbands and confirmed patient is still in flutter. Allowed patient to leave the office. Instructed her to call if she has any concerns prior to procedure.

## 2015-02-12 ENCOUNTER — Encounter (HOSPITAL_COMMUNITY): Payer: Self-pay | Admitting: Anesthesiology

## 2015-02-12 ENCOUNTER — Ambulatory Visit (HOSPITAL_COMMUNITY)
Admission: RE | Admit: 2015-02-12 | Discharge: 2015-02-12 | Disposition: A | Discharge status: Home / Self Care | Payer: Medicare Other | Source: Ambulatory Visit | Attending: Internal Medicine | Admitting: Internal Medicine

## 2015-02-12 ENCOUNTER — Ambulatory Visit (HOSPITAL_COMMUNITY): Payer: Medicare Other | Admitting: Anesthesiology

## 2015-02-12 ENCOUNTER — Encounter (HOSPITAL_COMMUNITY)
Admission: RE | Disposition: A | Discharge status: Home / Self Care | Payer: Self-pay | Source: Ambulatory Visit | Attending: Internal Medicine

## 2015-02-12 DIAGNOSIS — I442 Atrioventricular block, complete: Secondary | ICD-10-CM | POA: Diagnosis not present

## 2015-02-12 DIAGNOSIS — Z7984 Long term (current) use of oral hypoglycemic drugs: Secondary | ICD-10-CM | POA: Diagnosis not present

## 2015-02-12 DIAGNOSIS — Z7982 Long term (current) use of aspirin: Secondary | ICD-10-CM | POA: Diagnosis not present

## 2015-02-12 DIAGNOSIS — E119 Type 2 diabetes mellitus without complications: Secondary | ICD-10-CM | POA: Insufficient documentation

## 2015-02-12 DIAGNOSIS — I2510 Atherosclerotic heart disease of native coronary artery without angina pectoris: Secondary | ICD-10-CM | POA: Diagnosis not present

## 2015-02-12 DIAGNOSIS — I4892 Unspecified atrial flutter: Secondary | ICD-10-CM | POA: Diagnosis not present

## 2015-02-12 DIAGNOSIS — I483 Typical atrial flutter: Secondary | ICD-10-CM

## 2015-02-12 DIAGNOSIS — E785 Hyperlipidemia, unspecified: Secondary | ICD-10-CM | POA: Diagnosis not present

## 2015-02-12 DIAGNOSIS — Z950 Presence of cardiac pacemaker: Secondary | ICD-10-CM | POA: Insufficient documentation

## 2015-02-12 DIAGNOSIS — I110 Hypertensive heart disease with heart failure: Secondary | ICD-10-CM | POA: Insufficient documentation

## 2015-02-12 DIAGNOSIS — I484 Atypical atrial flutter: Secondary | ICD-10-CM | POA: Diagnosis not present

## 2015-02-12 DIAGNOSIS — I509 Heart failure, unspecified: Secondary | ICD-10-CM | POA: Insufficient documentation

## 2015-02-12 HISTORY — PX: ELECTROPHYSIOLOGIC STUDY: SHX172A

## 2015-02-12 LAB — GLUCOSE, CAPILLARY
GLUCOSE-CAPILLARY: 125 mg/dL — AB (ref 65–99)
GLUCOSE-CAPILLARY: 131 mg/dL — AB (ref 65–99)

## 2015-02-12 SURGERY — ELECTROPHYSIOLOGY STUDY
Anesthesia: General

## 2015-02-12 MED ORDER — SODIUM CHLORIDE 0.9 % IV SOLN: 250.0000 mL | INTRAVENOUS | Status: DC | PRN

## 2015-02-12 MED ORDER — APIXABAN 5 MG PO TABS: 5.0000 mg | ORAL_TABLET | Freq: Two times a day (BID) | ORAL | Status: DC

## 2015-02-12 MED ORDER — FUROSEMIDE 10 MG/ML IJ SOLN
80.0000 mg | Freq: Two times a day (BID) | INTRAMUSCULAR | Status: AC
  Administered 2015-02-12: 80 mg via INTRAVENOUS

## 2015-02-12 MED ORDER — FUROSEMIDE 10 MG/ML IJ SOLN
INTRAMUSCULAR | Status: AC
  Filled 2015-02-12: qty 4

## 2015-02-12 MED ORDER — SODIUM CHLORIDE 0.9% FLUSH: 3.0000 mL | Freq: Two times a day (BID) | INTRAVENOUS | Status: DC

## 2015-02-12 MED ORDER — HEPARIN (PORCINE) IN NACL 2-0.9 UNIT/ML-% IJ SOLN
INTRAMUSCULAR | Status: DC | PRN
  Administered 2015-02-12: 500 mL

## 2015-02-12 MED ORDER — HEPARIN (PORCINE) IN NACL 2-0.9 UNIT/ML-% IJ SOLN
INTRAMUSCULAR | Status: AC
  Filled 2015-02-12: qty 500

## 2015-02-12 MED ORDER — PROPOFOL 10 MG/ML IV BOLUS
INTRAVENOUS | Status: DC | PRN
  Administered 2015-02-12: 80 mg via INTRAVENOUS
  Administered 2015-02-12 (×2): 30 mg via INTRAVENOUS

## 2015-02-12 MED ORDER — SODIUM CHLORIDE 0.9% FLUSH: 3.0000 mL | INTRAVENOUS | Status: DC | PRN

## 2015-02-12 MED ORDER — BUPIVACAINE HCL (PF) 0.25 % IJ SOLN
INTRAMUSCULAR | Status: AC
  Filled 2015-02-12: qty 30

## 2015-02-12 MED ORDER — SODIUM CHLORIDE 0.9 % IV SOLN
INTRAVENOUS | Status: DC
  Administered 2015-02-12: 07:00:00 via INTRAVENOUS

## 2015-02-12 MED ORDER — LIDOCAINE HCL (CARDIAC) 20 MG/ML IV SOLN
INTRAVENOUS | Status: DC | PRN
  Administered 2015-02-12: 60 mg via INTRAVENOUS

## 2015-02-12 MED ORDER — FENTANYL CITRATE (PF) 100 MCG/2ML IJ SOLN
INTRAMUSCULAR | Status: DC | PRN
  Administered 2015-02-12 (×2): 25 ug via INTRAVENOUS

## 2015-02-12 MED ORDER — SODIUM CHLORIDE 0.9 % IV SOLN
INTRAVENOUS | Status: DC | PRN
  Administered 2015-02-12: 07:00:00 via INTRAVENOUS

## 2015-02-12 MED ORDER — ONDANSETRON HCL 4 MG/2ML IJ SOLN
INTRAMUSCULAR | Status: DC | PRN
  Administered 2015-02-12 (×2): 4 mg via INTRAVENOUS

## 2015-02-12 MED ORDER — IRBESARTAN 75 MG PO TABS: 37.5000 mg | ORAL_TABLET | Freq: Every day | ORAL | Status: DC

## 2015-02-12 MED ORDER — BUPIVACAINE HCL (PF) 0.25 % IJ SOLN
INTRAMUSCULAR | Status: DC | PRN
  Administered 2015-02-12: 60 mL

## 2015-02-12 SURGICAL SUPPLY — 11 items
BAG SNAP BAND KOVER 36X36 (MISCELLANEOUS) ×3 IMPLANT
CATH DUODECA HALO/ISMUS 7FR (CATHETERS) ×3 IMPLANT
CATH OCTAPOLOR 6F 125CM 2-5-2 (CATHETERS) ×3 IMPLANT
PACK EP LATEX FREE (CUSTOM PROCEDURE TRAY) ×2
PACK EP LF (CUSTOM PROCEDURE TRAY) ×1 IMPLANT
PAD DEFIB LIFELINK (PAD) ×3 IMPLANT
SHEATH ATRIAL FLUTTER SAFL 8F (SHEATH) ×3 IMPLANT
SHEATH PINNACLE 6F 10CM (SHEATH) ×3 IMPLANT
SHEATH PINNACLE 7F 10CM (SHEATH) ×3 IMPLANT
SHEATH PINNACLE 8F 10CM (SHEATH) ×3 IMPLANT
SHIELD RADPAD SCOOP 12X17 (MISCELLANEOUS) ×3 IMPLANT

## 2015-02-12 NOTE — Anesthesia Preprocedure Evaluation (Signed)
Anesthesia Evaluation  Patient identified by MRN, date of birth, ID band Patient awake    Reviewed: Allergy & Precautions, NPO status , Patient's Chart, lab work & pertinent test results  History of Anesthesia Complications Negative for: history of anesthetic complications  Airway Mallampati: II  TM Distance: >3 FB Neck ROM: Full    Dental  (+) Upper Dentures, Edentulous Lower   Pulmonary shortness of breath, former smoker,    breath sounds clear to auscultation       Cardiovascular hypertension, Pt. on medications and Pt. on home beta blockers + CAD and +CHF  + dysrhythmias Atrial Fibrillation + pacemaker  Rhythm:Regular     Neuro/Psych negative neurological ROS  negative psych ROS   GI/Hepatic negative GI ROS, Neg liver ROS,   Endo/Other  diabetesMorbid obesity  Renal/GU negative Renal ROS     Musculoskeletal   Abdominal   Peds  Hematology   Anesthesia Other Findings   Reproductive/Obstetrics                             Anesthesia Physical Anesthesia Plan  ASA: III  Anesthesia Plan: General   Post-op Pain Management:    Induction: Intravenous  Airway Management Planned: Oral ETT and LMA  Additional Equipment: None  Intra-op Plan:   Post-operative Plan: Extubation in OR  Informed Consent: I have reviewed the patients History and Physical, chart, labs and discussed the procedure including the risks, benefits and alternatives for the proposed anesthesia with the patient or authorized representative who has indicated his/her understanding and acceptance.   Dental advisory given  Plan Discussed with: Surgeon and CRNA  Anesthesia Plan Comments:         Anesthesia Quick Evaluation

## 2015-02-12 NOTE — Discharge Instructions (Signed)
Do not restart  Metformin again X 48hrs May restart on 02-15-2015   Angiogram, Care After These instructions give you information about caring for yourself after your procedure. Your doctor may also give you more specific instructions. Call your doctor if you have any problems or questions after your procedure.  HOME CARE  Take medicines only as told by your doctor.  Follow your doctor's instructions about:  Care of the area where the tube was inserted.  Bandage (dressing) changes and removal.  You may shower 24-48 hours after the procedure or as told by your doctor.  Do not take baths, swim, or use a hot tub until your doctor approves.  Every day, check the area where the tube was inserted. Watch for:  Redness, swelling, or pain.  Fluid, blood, or pus.  Do not apply powder or lotion to the site.  Do not lift anything that is heavier than 10 lb (4.5 kg) for 5 days or as told by your doctor.  Ask your doctor when you can:  Return to work or school.  Do physical activities or play sports.  Have sex.  Do not drive or operate heavy machinery for 24 hours or as told by your doctor.  Have someone with you for the first 24 hours after the procedure.  Keep all follow-up visits as told by your doctor. This is important. GET HELP IF:  You have a fever.   You have chills.   You have more bleeding from the area where the tube was inserted. Hold pressure on the area.  You have redness, swelling, or pain in the area where the tube was inserted.  You have fluid or pus coming from the area. GET HELP RIGHT AWAY IF:   You have a lot of pain in the area where the tube was inserted.  The area where the tube was inserted is bleeding, and the bleeding does not stop after 30 minutes of holding steady pressure on the area.  The area near or just beyond the insertion site becomes pale, cool, tingly, or numb.   This information is not intended to replace advice given to you by  your health care provider. Make sure you discuss any questions you have with your health care provider.   Document Released: 03/24/2008 Document Revised: 01/16/2014 Document Reviewed: 05/29/2012 Elsevier Interactive Patient Education [employer]

## 2015-02-12 NOTE — H&P (View-Only) (Signed)
Patient Care Team: Joette Catching, MD as PCP - General (Family Medicine) Duke Salvia, MD (Cardiology)   HPI  Brittany [name] is a 82 y.o. female is seen in followup for complete heart block she is status post pacemaker implantation  she is having no problem related to this.  She has a history of prior LAD POBA with normal left ventricular function.   The patient was hospitalized recently for congestive heart failure. It was noted at that time that she was in atrial flutter. She saw RU PA about a week and a half ago and she declined at that point anticoagulation.   she continues to struggle with some shortness of breath although it is  A little bit better.   She also has some atypical L arm pain, lasting up to an hour or so and unrelated to eertion.  It mostly is in her shoulder and into her back    Echocardiogram 12/16 demonstrated normal LV function and moderate-severe LAE (45/22.2/45)  Past Medical History  Diagnosis Date  . CAD (coronary artery disease)   . Pacemaker     STJ pacer, Dr. Ladona Ridgel, 2006  . AV block   . Hyperlipidemia   . Hypertension   . Diabetes mellitus     Type II  . Glaucoma     Past Surgical History  Procedure Laterality Date  . Tonsillectomy  1937  . Breast biopsy  1991  . Left total knee replacement  1996  . Pacemaker insertion      St. Jude  . Femur im nail  01/18/2012    Procedure: INTRAMEDULLARY (IM) NAIL FEMORAL;  Surgeon: Senaida Lange, MD;  Location: [facility] OR;  Service: Orthopedics;  Laterality: Left;    Current Outpatient Prescriptions  Medication Sig Dispense Refill  . aspirin 81 MG tablet Take 81 mg by mouth at bedtime.    . carvedilol (COREG) 25 MG tablet TAKE  (1)  TABLET TWICE A DAY WITH MEALS (BREAKFAST AND SUPPER) 60 tablet 4  . Coenzyme Q10 (EQL COQ10) 300 MG CAPS Take 200 mg by mouth daily.     . furosemide (LASIX) 40 MG tablet Take 1 tablet (40 mg total) by mouth 2 (two) times daily. 60 tablet 0  . irbesartan (AVAPRO)  75 MG tablet Take 0.5 tablets (37.5 mg total) by mouth daily. 30 tablet 0  . isosorbide mononitrate (IMDUR) 30 MG 24 hr tablet Take 1 tablet (30 mg total) by mouth daily. 30 tablet 0  . MEGARED OMEGA-3 KRILL OIL 500 MG CAPS Take 1 tablet by mouth daily.    . metFORMIN (GLUCOPHAGE-XR) 500 MG 24 hr tablet Take 1,000 mg by mouth 2 (two) times daily.    . Multiple Vitamin (MULTIVITAMIN) tablet Take 1 tablet by mouth daily. CENTRUM SILVER    . potassium chloride 20 MEQ TBCR Take 20 mEq by mouth daily. 30 tablet 0   No current facility-administered medications for this visit.    Allergies  Allergen Reactions  . Ciprofloxacin Other (See Comments)    Severe chest pains  . Atorvastatin Other (See Comments)    REACTION: Myalgia  . Cedax [Ceftibuten] Other (See Comments)    Possibly nausea/vomiting, cyanosis, severe headaches  . Plavix [Clopidogrel Bisulfate] Other (See Comments)    Possibly nausea/vomiting, cyanosis, severe headaches    . Statins Other (See Comments)    Myalgias, cyanosis  . Ace Inhibitors Other (See Comments)    REACTION: cough and sore throat  Review of Systems negative except from HPI and PMH  Physical Exam BP 122/60 mmHg  Pulse 50  Ht  (1.549 m)  Wt 188 lb (85.276 kg)  BMI 35.54 kg/m2 Well developed and well nourished in no acute distress HENT normal E scleral and icterus clear Neck Supple JVP<10; carotids brisk and full Clear to ausculation Device pocket well healed; without hematoma or erythema.  There is no tethering Regular rate and rhythm, no murmurs gallops or rub Soft with active bowel sounds No clubbing cyanosis 2+R>L   Edema Alert and oriented, grossly normal motor and sensory function Skin Warm and Dry  ECG demonstrates  Ventricular pacing with underlying atrial flutter; flutter waves are described below  Assessment and  Plan  Hypertension much improved  Complete heart block  Pacemaker-St. Jude The patient's device was interrogated.   The information was reviewed. No changes were made in the programming.    Coronary disease with prior LAD POBA Without symptoms of ischemia  Atrial flutter  Chest/LA pain    the patient has had worsening shortness of breath and heart failure temporally related to the development of atrial flutter by electrocardiogram. He relatively typical with negative flutter waves with low amplitude in the inferior leads an upright flutter waves in lead V1.    We have discussed strategies including catheter ablation as primary N the issues of recurrence of atrial fibrillation as a downside. We will proceed with catheter ablation following 3 weeks of antecedent anticoagulation.  We discussed the use of the NOACs compared to Coumadin. We briefly reviewed the data of at least comparability in stroke prevention, bleeding and outcome. We discussed some of the new once wherein somewhat associated with decreased ischemic stroke risk, one to be taken daily, and has been shown to be comparable and bleeding risk to aspirin.  We also discussed bleeding associated with warfarin as well as NOACs and a wall bleeding as a complication of all these drugs intracranial bleeding is more frequently associated with warfarin then the NOACs and a GI bleeding is more commonly associated with the latter   I am not sure as to the cause of her chest and left arm pain. It certainly does not sound cardiac but with her history of coronary disease we will undertake Myoview scanning to exclude significant progression of coronary disease. If this is unrevealing we will check a sedimentation rate to exclude  PMR   She is volume overloaded. We will increase her diuretics from 40 twice a day---80 twice a day 4 days;  renal function was normal a couple of weeks ago  Her device is approaching ERI

## 2015-02-12 NOTE — Progress Notes (Signed)
Dr. Graciela Husbands wants pt to stay until PA can come renew her prescriptions. Pt and daughter understand and are ok waiting.

## 2015-02-12 NOTE — Interval H&P Note (Signed)
History and Physical Interval Note:  02/12/2015 7:46 AM  Brittany [name]  has presented today for surgery, with the diagnosis of aflutter  The various methods of treatment have been discussed with the patient and family. After consideration of risks, benefits and other options for treatment, the patient has consented to  Procedure(s): flutter ablation (N/A) as a surgical intervention .  The patient's history has been reviewed, patient examined, no change in status, stable for surgery.  I have reviewed the patient's chart and labs.  Questions were answered to the patient's satisfaction.     Sherryl Manges

## 2015-02-12 NOTE — Transfer of Care (Signed)
Immediate Anesthesia Transfer of Care Note  Patient: Brittany [name]  Procedure(s) Performed: Procedure(s): Electrophysiology Study (N/A)  Patient Location: Cath Lab  Anesthesia Type:General  Level of Consciousness: awake, alert , oriented and patient cooperative  Airway & Oxygen Therapy: Patient Spontanous Breathing and Patient connected to nasal cannula oxygen  Post-op Assessment: Report given to RN and Post -op Vital signs reviewed and stable  Post vital signs: Reviewed and stable  Last Vitals:  Filed Vitals:   02/12/15 0608  BP: 110/99  Pulse: 62  Temp: 36.5 C  Resp: 16    Complications: No apparent anesthesia complications

## 2015-02-12 NOTE — Progress Notes (Signed)
Hematoma noticed at (L) groin.2x2  Pressure held x 20 min. Resolved/siter [employer]

## 2015-02-15 ENCOUNTER — Telehealth: Payer: Self-pay | Admitting: *Deleted

## 2015-02-15 ENCOUNTER — Encounter (HOSPITAL_COMMUNITY): Payer: Self-pay | Admitting: Internal Medicine

## 2015-02-15 NOTE — Anesthesia Postprocedure Evaluation (Signed)
Anesthesia Post Note  Patient: Brittany [name]  Procedure(s) Performed: Procedure(s) (LRB): Electrophysiology Study (N/A)  Patient location during evaluation: PACU Anesthesia Type: General Level of consciousness: awake Pain management: pain level controlled Vital Signs Assessment: post-procedure vital signs reviewed and stable Respiratory status: spontaneous breathing Cardiovascular status: stable Postop Assessment: no signs of nausea or vomiting Anesthetic complications: no    Last Vitals:  Filed Vitals:   02/12/15 1415 02/12/15 1515  BP: 146/62 129/59  Pulse:    Temp:    Resp:      Last Pain: There were no vitals filed for this visit.               Malikah Lakey

## 2015-02-17 ENCOUNTER — Encounter: Payer: Self-pay | Admitting: Gastroenterology

## 2015-02-18 NOTE — Telephone Encounter (Signed)
Attempted to call the patient.  I left a message for her to call to get permission to speak with her daughter, but if this can wait, we can discuss with her at her follow up appointment in clinic tomorrow with [facility].  I did discuss increasing the patient's Imdur to 60 mg once daily per Dr. Odessa Fleming recommendations on her myoview dated 01/22/15. I inadvertently missed updating her medication list to reflect this change.

## 2015-02-18 NOTE — Progress Notes (Addendum)
Cardiology Office Note Date:  02/19/2015  Patient ID:  [name], Rahe Feb 11, 1927, MRN [id number] PCP:  Josue Hector, MD  Electrophysiologist: Dr. Graciela Husbands   Chief Complaint:  They state they are here to f/u after her procedure  History of Present Illness: Brittany [name] is a 82 y.o. female with history of PPM, HTN, HLD, DM and CAD with last intervention PTCA in 2007, comes to the office after her EPS procedure last week.  She had a hospital stay for CHF, December 2016 where she was newly found with Aflutter.  She was initially reluctant about full a/c after further discussion with Dr. Graciela Husbands, she was noted to be having more heart failures symptoms, she became agreeable, was started on Eliquis, her diuretic was increased for a few days, followed by EPS/DCCV for Aflutter on 02/12/15.  She is feeling better, no SOB, palpitations or CP.  She sleeps very well, denied PND or orthopnea symptoms.  She sleeps in a bed that elevates her head but she says not much, less then or about 2 pillows elevation only.  She had marked edema a few days ago her PMD has had her taking her lasix BID for the the last 3 days, to continue today a 4th day, the patient and her daughter states the swelling is decreasing.  She has had no dizziness, near syncope or syncope.  The patient states that since December time she feels like she is doing much better and her daughter agrees.  The patient denies any bleeding or pain at her procedures sites, she did have bruising particularly on the left   Past Medical History  Diagnosis Date  . CAD (coronary artery disease)   . Pacemaker     STJ pacer, Dr. Ladona Ridgel, 2006  . AV block   . Hyperlipidemia   . Hypertension   . Diabetes mellitus     Type II  . Glaucoma     Past Surgical History  Procedure Laterality Date  . Tonsillectomy  1937  . Breast biopsy  1991  . Left total knee replacement  1996  . Pacemaker insertion      St. Jude  . Femur im nail  01/18/2012   Procedure: INTRAMEDULLARY (IM) NAIL FEMORAL;  Surgeon: Senaida Lange, MD;  Location: [facility] OR;  Service: Orthopedics;  Laterality: Left;  . Electrophysiologic study N/A 02/12/2015    Procedure: Electrophysiology Study;  Surgeon: Duke Salvia, MD;  Location: [facility] INVASIVE CV LAB;  Service: Cardiovascular;  Laterality: N/A;    Current Outpatient Prescriptions  Medication Sig Dispense Refill  . apixaban (ELIQUIS) 5 MG TABS tablet Take 1 tablet (5 mg total) by mouth 2 (two) times daily. 60 tablet 6  . carvedilol (COREG) 25 MG tablet TAKE  (1)  TABLET TWICE A DAY WITH MEALS (BREAKFAST AND SUPPER) 60 tablet 4  . Coenzyme Q10 (EQL COQ10) 300 MG CAPS Take 200 mg by mouth daily.     . furosemide (LASIX) 40 MG tablet Take 1 tablet (40 mg total) by mouth 2 (two) times daily. 60 tablet 0  . irbesartan (AVAPRO) 75 MG tablet Take 0.5 tablets (37.5 mg total) by mouth daily. 30 tablet 3  . isosorbide mononitrate (IMDUR) 60 MG 24 hr tablet Take 1 tablet (60 mg total) by mouth daily. 30 tablet 11  . MEGARED OMEGA-3 KRILL OIL 500 MG CAPS Take 1 tablet by mouth daily.    . metFORMIN (GLUCOPHAGE-XR) 500 MG 24 hr tablet Take 1,000 mg by mouth 2 (  two) times daily.    . Multiple Vitamin (MULTIVITAMIN) tablet Take 1 tablet by mouth daily. CENTRUM SILVER    . potassium chloride 20 MEQ TBCR Take 20 mEq by mouth daily. 30 tablet 0   No current facility-administered medications for this visit.    Allergies:   Ciprofloxacin; Atorvastatin; Cedax; Plavix; Statins; and Ace inhibitors   Social History:  The patient  reports that she quit smoking about 64 years ago. Her smoking use included Cigarettes. She has never used smokeless tobacco. She reports that she drinks alcohol. She reports that she does not use illicit drugs.   Family History:  The patient reports her parents and siblings were healthy, her father died of a stroke in his old age.    ROS:  Please see the history of present illness. .   All other systems are  reviewed and otherwise negative.   PHYSICAL EXAM:  VS:  BP 128/66 mmHg  Pulse 72  Ht  (1.549 m)  Wt 182 lb (82.555 kg)  BMI 34.41 kg/m2  SpO2 96% BMI: Body mass index is 34.41 kg/(m^2). Well nourished, moderately obese, well developed, in no acute distress HEENT: normocephalic, atraumatic Neck: no JVD, carotid bruits or masses Cardiac:  normal S1, S2; RRR (paced); no significant murmurs, no rubs, or gallops Lungs:  clear to auscultation bilaterally, no wheezing, rhonchi or rales Abd: soft, nontender MS: no deformity or atrophy Ext: trace edema Skin: warm and dry, no rash Neuro:  No gross deficits appreciated Psych: euthymic mood, full affect  PPM site is stable, no tethering or discomfort Procedure sites, R groin is without ecchymosis, soft, nontender, the left groin has large area of ecchymosis, it is soft, no hematoma and non-tender, she has good pedal pulses.   EKG:  AV paced PPM battery check today not quite to ERI PPM check 01/07/16 showed  mode switch to VVI 50 since about Sept, battery estimate was approx 6 months  02/12/15: EPS/DCCV, Dr. Graciela Husbands Preop Dx: atrial flutter Postop Dx same- atypical Left sided Procedure: EPS/ DCCV  01/22/15: Stress Myoview There is a large defect of moderate severity. The defect is partially reversible.    Overall Study Impression Myocardial perfusion is abnormal. Findings consistent with ischemia. This is an intermediate risk study. Overall left ventricular systolic function was normal. LV cavity size is normal. Nuclear stress EF: 66%. There is no prior study for comparison.     01/03/15 Echocardiogram Study Conclusions  Left ventricle: The cavity size was normal. Wall thickness was increased in a pattern of mild LVH. Systolic function was normal. The estimated ejection fraction was in the range of 55% to 60%. Wall motion was normal; there were no regional wall motion abnormalities. - Aortic valve: There was trivial  regurgitation. - Mitral valve: Calcified annulus. There was mild regurgitation. - Left atrium: The atrium was moderately dilated. - Right atrium: The atrium was mildly dilated. - Pulmonary arteries: PA peak pressure: 53 mm Hg (S).  Recent Labs: 01/02/2015: B Natriuretic Peptide 232.3*; TSH 1.793 01/04/2015: Magnesium 2.0 02/10/2015: BUN 20; Creat 0.76; Hemoglobin 11.7*; Platelets 176; Potassium 3.9; Sodium 142  No results found for requested labs within last 365 days.   Estimated Creatinine Clearance: 50.1 mL/min (by C-G formula based on Cr of 0.76).   Wt Readings from Last 3 Encounters:  02/19/15 182 lb (82.555 kg)  02/12/15 186 lb (84.369 kg)  02/10/15 188 lb 6.4 oz (85.458 kg)     Other studies reviewed: Additional studies/records reviewed today include:  summarized above  DEVICE information:  STJ PPM implanted 2006 with Dr. Ladona Ridgel  ASSESSMENT AND PLAN:  1. Recent hospital stay with CHF     appears compensated by exam/lack of symptoms     Re-counseled on daily weights, minimzing sodium intake, and notify if she notes rapid weight gain/recurrent symptoms     She is feeling better, she is edematous, but reports is improving.     CHF teaching is done      2. PPM     PACER DEPENDENT     Device clinic check in 1 mo, nearing ERI  3. CAD cutting balloon angioplasty to mid LAD (80%>>10%) in 2007 (negative adenosine Myoview 2010, LVEF 64%) Recent abnormal stress test, her nitrate was up-titrated  No c/o CP On ASA, BB, she reports an intolerance or allergy to statins, hx of myalgia with atorvastatin  4. HTN Appears well controlled  5. New onset AFlutter CHADS2Vasc is at least 6 On Eliquis S/p EPS noting left sided AFlutter, had DCCV   Disposition: F/u with device clinic pacer check in 1 month, office visit in 51mo, sooner if needed.  S he will complete the additional lasix regime as she was instructed by her PMD office and monitor her continued response, if her edema does not  continue to improve, or if worsens or develops any symptoms they will call, or seek attention if needed.   Current medicines are reviewed at length with the patient today.  The patient did not have any concerns regarding medicines.  Judith Blonder, PA-C 02/19/2015 2:55 PM     [facility] [address] [place] [address] [phone] (office)  [phone] (fax)

## 2015-02-19 ENCOUNTER — Ambulatory Visit (INDEPENDENT_AMBULATORY_CARE_PROVIDER_SITE_OTHER): Payer: Medicare Other | Admitting: Physician Assistant

## 2015-02-19 ENCOUNTER — Encounter: Payer: Self-pay | Admitting: Physician Assistant

## 2015-02-19 VITALS — BP 128/66 | HR 72 | Ht 61.0 in | Wt 182.0 lb

## 2015-02-19 DIAGNOSIS — I442 Atrioventricular block, complete: Secondary | ICD-10-CM | POA: Diagnosis not present

## 2015-02-19 DIAGNOSIS — I5023 Acute on chronic systolic (congestive) heart failure: Secondary | ICD-10-CM | POA: Diagnosis not present

## 2015-02-19 DIAGNOSIS — I4891 Unspecified atrial fibrillation: Secondary | ICD-10-CM | POA: Diagnosis not present

## 2015-02-19 DIAGNOSIS — I2510 Atherosclerotic heart disease of native coronary artery without angina pectoris: Secondary | ICD-10-CM

## 2015-02-19 MED ORDER — ISOSORBIDE MONONITRATE ER 60 MG PO TB24: 60.0000 mg | ORAL_TABLET | Freq: Every day | ORAL | Status: DC

## 2015-02-19 NOTE — Patient Instructions (Addendum)
Medication Instructions:  Your physician recommends that you continue on your current medications as directed. Please refer to the Current Medication list given to you today.  Labwork: NONE  Testing/Procedures: NONE  Follow-Up: Your physician recommends that you schedule a follow-up appointment in: 1 month with device clinic for pacemaker check.  Your physician wants you to follow-up in: 3 months with Rella Larve PA.   If you need a refill on your cardiac medications before your next appointment, please call your pharmacy.

## 2015-02-19 NOTE — Telephone Encounter (Signed)
The patient saw Luster Landsberg, [place] today.

## 2015-02-22 ENCOUNTER — Telehealth: Payer: Self-pay

## 2015-02-22 NOTE — Telephone Encounter (Signed)
Prior auth for Eliquis 5 mg sent to [facility]. 

## 2015-02-23 ENCOUNTER — Telehealth: Payer: Self-pay

## 2015-02-23 NOTE — Telephone Encounter (Signed)
Eliquis approved through 01/09/2016. PA [id number].

## 2015-03-22 ENCOUNTER — Other Ambulatory Visit: Payer: Self-pay | Admitting: Internal Medicine

## 2015-03-25 ENCOUNTER — Ambulatory Visit (INDEPENDENT_AMBULATORY_CARE_PROVIDER_SITE_OTHER): Payer: Medicare Other | Admitting: Internal Medicine

## 2015-03-25 ENCOUNTER — Encounter: Payer: Self-pay | Admitting: Internal Medicine

## 2015-03-25 ENCOUNTER — Encounter: Payer: Self-pay | Admitting: *Deleted

## 2015-03-25 VITALS — BP 132/62 | HR 70 | Ht 62.0 in | Wt 174.4 lb

## 2015-03-25 DIAGNOSIS — Z45010 Encounter for checking and testing of cardiac pacemaker pulse generator [battery]: Secondary | ICD-10-CM

## 2015-03-25 DIAGNOSIS — Z45018 Encounter for adjustment and management of other part of cardiac pacemaker: Secondary | ICD-10-CM | POA: Diagnosis not present

## 2015-03-25 DIAGNOSIS — I442 Atrioventricular block, complete: Secondary | ICD-10-CM | POA: Diagnosis not present

## 2015-03-25 DIAGNOSIS — Z01812 Encounter for preprocedural laboratory examination: Secondary | ICD-10-CM

## 2015-03-25 LAB — CBC WITH DIFFERENTIAL/PLATELET
BASOS ABS: 0 10*3/uL (ref 0.0–0.1)
BASOS PCT: 0 % (ref 0–1)
Eosinophils Absolute: 0.3 10*3/uL (ref 0.0–0.7)
Eosinophils Relative: 4 % (ref 0–5)
HEMATOCRIT: 39.1 % (ref 36.0–46.0)
HEMOGLOBIN: 13.3 g/dL (ref 12.0–15.0)
LYMPHS PCT: 29 % (ref 12–46)
Lymphs Abs: 2.3 10*3/uL (ref 0.7–4.0)
MCH: 29.6 pg (ref 26.0–34.0)
MCHC: 34 g/dL (ref 30.0–36.0)
MCV: 87.1 fL (ref 78.0–100.0)
MPV: 10.1 fL (ref 8.6–12.4)
Monocytes Absolute: 0.8 10*3/uL (ref 0.1–1.0)
Monocytes Relative: 10 % (ref 3–12)
NEUTROS ABS: 4.5 10*3/uL (ref 1.7–7.7)
NEUTROS PCT: 57 % (ref 43–77)
Platelets: 194 10*3/uL (ref 150–400)
RBC: 4.49 MIL/uL (ref 3.87–5.11)
RDW: 14.5 % (ref 11.5–15.5)
WBC: 7.9 10*3/uL (ref 4.0–10.5)

## 2015-03-25 LAB — BASIC METABOLIC PANEL
BUN: 18 mg/dL (ref 7–25)
CO2: 28 mmol/L (ref 20–31)
Calcium: 11 mg/dL — ABNORMAL HIGH (ref 8.6–10.4)
Chloride: 106 mmol/L (ref 98–110)
Creat: 0.66 mg/dL (ref 0.60–0.88)
Glucose, Bld: 117 mg/dL — ABNORMAL HIGH (ref 65–99)
POTASSIUM: 4.1 mmol/L (ref 3.5–5.3)
Sodium: 142 mmol/L (ref 135–146)

## 2015-03-25 MED ORDER — CARVEDILOL 25 MG PO TABS: 12.5000 mg | ORAL_TABLET | Freq: Two times a day (BID) | ORAL | Status: DC

## 2015-03-25 MED ORDER — IRBESARTAN 75 MG PO TABS: 37.5000 mg | ORAL_TABLET | Freq: Every day | ORAL | Status: DC

## 2015-03-25 MED ORDER — ISOSORBIDE MONONITRATE ER 30 MG PO TB24: 30.0000 mg | ORAL_TABLET | Freq: Every day | ORAL | Status: DC

## 2015-03-25 NOTE — Progress Notes (Signed)
Patient Care Team: Joette CatchingLeonard Nyland, MD as PCP - General (Family Medicine) Duke SalviaSteven C Kristian Mogg, MD (Cardiology)   HPI  Brittany [name] is a 82 y.o. female is seen in followup for complete heart block; she is status post pacemaker implantation     She has a history of prior LAD POBA with normal left ventricular function.   The patient was hospitalized recently for congestive heart failure. It was noted at that time that she was in atrial flutter. She saw RU PA about a week and a half ago and she declined at that point anticoagulation. Subsequently, she agreed to anticoagulation and was taken to the lab for what appeared to be typical. Entrainment mapping, however, demonstrated a left atrial flutter and she was there  cardioverted.   Her edema has resolved. She has dizziness with standing. She denies chest pain.  Her pacemaker has reached ERI.       Echocardiogram 12/16 demonstrated normal LV function and moderate-severe LAE (45/22.2/45)  Past Medical History  Diagnosis Date  . CAD (coronary artery disease)   . Pacemaker     STJ pacer, Dr. Ladona Ridgelaylor, 2006  . AV block   . Hyperlipidemia   . Hypertension   . Diabetes mellitus     Type II  . Glaucoma     Past Surgical History  Procedure Laterality Date  . Tonsillectomy  1937  . Breast biopsy  1991  . Left total knee replacement  1996  . Pacemaker insertion      St. Jude  . Femur im nail  01/18/2012    Procedure: INTRAMEDULLARY (IM) NAIL FEMORAL;  Surgeon: Senaida LangeKevin M Supple, MD;  Location: [facility] OR;  Service: Orthopedics;  Laterality: Left;  . Electrophysiologic study N/A 02/12/2015    Procedure: Electrophysiology Study;  Surgeon: Duke SalviaSteven C Isadore Palecek, MD;  [facility];  Service: Cardiovascular;  Laterality: N/A;    Current Outpatient Prescriptions  Medication Sig Dispense Refill  . apixaban (ELIQUIS) 5 MG TABS tablet Take 1 tablet (5 mg total) by mouth 2 (two) times daily. 60 tablet 6  . carvedilol (COREG) 25 MG tablet  TAKE (1) TABLET TWICE A DAY WITH MEALS (BREAKFAST AND SUPPER) 60 tablet 3  . Coenzyme Q10 (EQL COQ10) 300 MG CAPS Take 200 mg by mouth daily.     . furosemide (LASIX) 40 MG tablet Take 1 tablet (40 mg total) by mouth 2 (two) times daily. 60 tablet 0  . irbesartan (AVAPRO) 75 MG tablet Take 0.5 tablets (37.5 mg total) by mouth daily. 30 tablet 3  . isosorbide mononitrate (IMDUR) 60 MG 24 hr tablet Take 1 tablet (60 mg total) by mouth daily. 30 tablet 11  . MEGARED OMEGA-3 KRILL OIL 500 MG CAPS Take 1 tablet by mouth daily.    . metFORMIN (GLUCOPHAGE-XR) 500 MG 24 hr tablet Take 1,000 mg by mouth 2 (two) times daily.    . Multiple Vitamin (MULTIVITAMIN) tablet Take 1 tablet by mouth daily. CENTRUM SILVER    . potassium chloride 20 MEQ TBCR Take 20 mEq by mouth daily. 30 tablet 0   No current facility-administered medications for this visit.    Allergies  Allergen Reactions  . Ciprofloxacin Other (See Comments)    Severe chest pains  . Atorvastatin Other (See Comments)    REACTION: Myalgia  . Cedax [Ceftibuten] Other (See Comments)    Possibly nausea/vomiting, cyanosis, severe headaches  . Plavix [Clopidogrel Bisulfate] Other (See Comments)    Possibly nausea/vomiting,  cyanosis, severe headaches    . Statins Other (See Comments)    Myalgias, cyanosis  . Ace Inhibitors Other (See Comments)    REACTION: cough and sore throat    Review of Systems negative except from HPI and PMH  Physical Exam BP 132/62 mmHg  Pulse 70  Ht  (1.575 m)  Wt 174 lb 6.4 oz (79.107 kg)  BMI 31.89 kg/m2 Well developed and well nourished in no acute distress HENT normal E scleral and icterus clear Neck Supple JVP<10; carotids brisk and full Clear to ausculation Device pocket well healed; without hematoma or erythema.  There is no tethering Regular rate and rhythm, no murmurs gallops or rub Soft with active bowel sounds No clubbing cyanosis no   Edema Alert and oriented, grossly normal motor and  sensory function Skin Warm and Dry  ECG demonstrates  P synchronous pacing with some atrial under sensing and pacing at the lower rate limit  Assessment and  Plan  Hypertension much improved  Complete heart block  Pacemaker-St. Jude The patient's device was interrogated.  The information was reviewed. No changes were made in the programming.    Coronary disease with prior LAD POBA Without symptoms of ischemia  Atrial flutter  Orthostatic intolerance  I think perhaps we overshot on her blood pressure control. She is now symptomatically orthostatic. We will change her Avapro to nighttime. We will cut her carvedilol and Imdur in half.  We will undertake generator replacement.We have reviewed the benefits and risks of generator replacement.  These include but are not limited to lead fracture and infection.  The patient understands, agrees and is willing to proceed.

## 2015-03-25 NOTE — Patient Instructions (Addendum)
Medication Instructions:  Your physician has recommended you make the following change in your medication:  1) DECREASE Carvedilol to 12.5 mg twice a day 2) CHANGE when you take Irbesartan -  take at bedtime 3) DECREASE Isosorbide to 30 mg daily  Labwork: Pre procedure labs today: BMET & CBCD  Testing/Procedures: Your physician has recommended that you have a generator change (battery change).  Please see the instruction sheet given to you today for more information.   Follow-Up: Your physician recommends that you schedule a follow-up appointment in: 4 weeks, after 3/29 procedure, with device clinic for wound check.  Your physician recommends that you schedule a follow-up appointment in: 3 months, after 3/29 procedure, with Dr. Graciela HusbandsKlein.    If you need a refill on your cardiac medications before your next appointment, please call your pharmacy.  Thank you for choosing [facility]!!

## 2015-03-26 LAB — CUP PACEART INCLINIC DEVICE CHECK
Implantable Lead Implant Date: 20061229
Implantable Lead Location: 753859
Implantable Lead Model: 5076
Lead Channel Pacing Threshold Amplitude: 1 V
Lead Channel Pacing Threshold Amplitude: 1.75 V
Lead Channel Pacing Threshold Pulse Width: 0.5 ms
Lead Channel Pacing Threshold Pulse Width: 0.5 ms
Lead Channel Setting Sensing Sensitivity: 5 mV
MDC IDC LEAD IMPLANT DT: 20061229
MDC IDC LEAD LOCATION: 753860
MDC IDC MSMT BATTERY IMPEDANCE: 23300 Ohm
MDC IDC MSMT BATTERY REMAINING LONGEVITY: 3
MDC IDC MSMT BATTERY VOLTAGE: 2.58 V
MDC IDC MSMT LEADCHNL RA IMPEDANCE VALUE: 379 Ohm
MDC IDC MSMT LEADCHNL RV IMPEDANCE VALUE: 956 Ohm
MDC IDC SESS DTM: 20170316183318
MDC IDC SET LEADCHNL RA PACING AMPLITUDE: 2.5 V
MDC IDC SET LEADCHNL RV PACING AMPLITUDE: 3.5 V
MDC IDC SET LEADCHNL RV PACING PULSEWIDTH: 0.5 ms
Pulse Gen Model: 5816
Pulse Gen Serial Number: 1643955

## 2015-04-06 ENCOUNTER — Other Ambulatory Visit: Payer: Self-pay | Admitting: Physician Assistant

## 2015-04-06 ENCOUNTER — Other Ambulatory Visit: Payer: Self-pay | Admitting: Internal Medicine

## 2015-04-07 ENCOUNTER — Ambulatory Visit (HOSPITAL_COMMUNITY)
Admission: RE | Admit: 2015-04-07 | Discharge: 2015-04-07 | Disposition: A | Discharge status: Home / Self Care | Payer: Medicare Other | Source: Ambulatory Visit | Attending: Internal Medicine | Admitting: Internal Medicine

## 2015-04-07 ENCOUNTER — Encounter (HOSPITAL_COMMUNITY)
Admission: RE | Disposition: A | Discharge status: Home / Self Care | Payer: Self-pay | Source: Ambulatory Visit | Attending: Internal Medicine

## 2015-04-07 DIAGNOSIS — Z45010 Encounter for checking and testing of cardiac pacemaker pulse generator [battery]: Secondary | ICD-10-CM | POA: Insufficient documentation

## 2015-04-07 DIAGNOSIS — I509 Heart failure, unspecified: Secondary | ICD-10-CM | POA: Diagnosis not present

## 2015-04-07 DIAGNOSIS — Z7901 Long term (current) use of anticoagulants: Secondary | ICD-10-CM | POA: Diagnosis not present

## 2015-04-07 DIAGNOSIS — I110 Hypertensive heart disease with heart failure: Secondary | ICD-10-CM | POA: Diagnosis not present

## 2015-04-07 DIAGNOSIS — I4892 Unspecified atrial flutter: Secondary | ICD-10-CM | POA: Diagnosis not present

## 2015-04-07 DIAGNOSIS — E119 Type 2 diabetes mellitus without complications: Secondary | ICD-10-CM | POA: Diagnosis not present

## 2015-04-07 DIAGNOSIS — H409 Unspecified glaucoma: Secondary | ICD-10-CM | POA: Diagnosis not present

## 2015-04-07 DIAGNOSIS — I442 Atrioventricular block, complete: Secondary | ICD-10-CM | POA: Diagnosis not present

## 2015-04-07 DIAGNOSIS — I2510 Atherosclerotic heart disease of native coronary artery without angina pectoris: Secondary | ICD-10-CM | POA: Insufficient documentation

## 2015-04-07 DIAGNOSIS — E785 Hyperlipidemia, unspecified: Secondary | ICD-10-CM | POA: Insufficient documentation

## 2015-04-07 DIAGNOSIS — Z7984 Long term (current) use of oral hypoglycemic drugs: Secondary | ICD-10-CM | POA: Diagnosis not present

## 2015-04-07 DIAGNOSIS — Z950 Presence of cardiac pacemaker: Secondary | ICD-10-CM | POA: Diagnosis present

## 2015-04-07 HISTORY — PX: EP IMPLANTABLE DEVICE: SHX172B

## 2015-04-07 LAB — SURGICAL PCR SCREEN
MRSA, PCR: NEGATIVE
STAPHYLOCOCCUS AUREUS: NEGATIVE

## 2015-04-07 LAB — GLUCOSE, CAPILLARY: Glucose-Capillary: 139 mg/dL — ABNORMAL HIGH (ref 65–99)

## 2015-04-07 SURGERY — PPM/BIV PPM GENERATOR CHANGEOUT
Anesthesia: LOCAL

## 2015-04-07 MED ORDER — ACETAMINOPHEN 325 MG PO TABS: 325.0000 mg | ORAL_TABLET | ORAL | Status: DC | PRN

## 2015-04-07 MED ORDER — ONDANSETRON HCL 4 MG/2ML IJ SOLN: 4.0000 mg | Freq: Four times a day (QID) | INTRAMUSCULAR | Status: DC | PRN

## 2015-04-07 MED ORDER — SODIUM CHLORIDE 0.9 % IR SOLN
Status: AC
  Filled 2015-04-07: qty 2

## 2015-04-07 MED ORDER — SODIUM CHLORIDE 0.9 % IR SOLN
80.0000 mg | Status: AC
  Administered 2015-04-07: 80 mg

## 2015-04-07 MED ORDER — MUPIROCIN 2 % EX OINT
TOPICAL_OINTMENT | CUTANEOUS | Status: AC
  Administered 2015-04-07: 1 via TOPICAL
  Filled 2015-04-07: qty 22

## 2015-04-07 MED ORDER — SODIUM CHLORIDE 0.9 % IV SOLN
INTRAVENOUS | Status: DC
  Administered 2015-04-07: 11:00:00 via INTRAVENOUS

## 2015-04-07 MED ORDER — VANCOMYCIN HCL IN DEXTROSE 1-5 GM/200ML-% IV SOLN
1000.0000 mg | INTRAVENOUS | Status: AC
  Administered 2015-04-07: 1000 mg via INTRAVENOUS
  Filled 2015-04-07: qty 200

## 2015-04-07 MED ORDER — LIDOCAINE HCL (PF) 1 % IJ SOLN
INTRAMUSCULAR | Status: AC
  Filled 2015-04-07: qty 60

## 2015-04-07 MED ORDER — MIDAZOLAM HCL 5 MG/5ML IJ SOLN
INTRAMUSCULAR | Status: DC | PRN
  Administered 2015-04-07: 1 mg via INTRAVENOUS

## 2015-04-07 MED ORDER — CHLORHEXIDINE GLUCONATE 4 % EX LIQD: 60.0000 mL | Freq: Once | CUTANEOUS | Status: DC

## 2015-04-07 MED ORDER — MIDAZOLAM HCL 5 MG/5ML IJ SOLN
INTRAMUSCULAR | Status: AC
  Filled 2015-04-07: qty 5

## 2015-04-07 MED ORDER — MUPIROCIN 2 % EX OINT
1.0000 "application " | TOPICAL_OINTMENT | Freq: Once | CUTANEOUS | Status: AC
  Administered 2015-04-07: 1 via TOPICAL

## 2015-04-07 MED ORDER — SODIUM CHLORIDE 0.9 % IV SOLN: INTRAVENOUS | Status: AC

## 2015-04-07 MED ORDER — LIDOCAINE HCL (PF) 1 % IJ SOLN
INTRAMUSCULAR | Status: DC | PRN
  Administered 2015-04-07: 40 mL

## 2015-04-07 SURGICAL SUPPLY — 4 items
CABLE SURGICAL S-101-97-12 (CABLE) ×2 IMPLANT
PAD DEFIB LIFELINK (PAD) ×2 IMPLANT
PPM ASSURITY DR PM2240 (Pacemaker) ×2 IMPLANT
TRAY PACEMAKER INSERTION (PACKS) ×2 IMPLANT

## 2015-04-07 NOTE — H&P (View-Only) (Signed)
    Patient Care Team: Leonard Nyland, MD as PCP - General (Family Medicine) Jeffree Cazeau C Collier Monica, MD (Cardiology)   HPI  Brittany [name] is a 82 y.o. female is seen in followup for complete heart block; she is status post pacemaker implantation     She has a history of prior LAD POBA with normal left ventricular function.   The patient was hospitalized recently for congestive heart failure. It was noted at that time that she was in atrial flutter. She saw RU PA about a week and a half ago and she declined at that point anticoagulation. Subsequently, she agreed to anticoagulation and was taken to the lab for what appeared to be typical. Entrainment mapping, however, demonstrated a left atrial flutter and she was there  cardioverted.   Her edema has resolved. She has dizziness with standing. She denies chest pain.  Her pacemaker has reached ERI.       Echocardiogram 12/16 demonstrated normal LV function and moderate-severe LAE (45/22.2/45)  Past Medical History  Diagnosis Date  . CAD (coronary artery disease)   . Pacemaker     STJ pacer, Dr. Taylor, 2006  . AV block   . Hyperlipidemia   . Hypertension   . Diabetes mellitus     Type II  . Glaucoma     Past Surgical History  Procedure Laterality Date  . Tonsillectomy  1937  . Breast biopsy  1991  . Left total knee replacement  1996  . Pacemaker insertion      St. Jude  . Femur im nail  01/18/2012    Procedure: INTRAMEDULLARY (IM) NAIL FEMORAL;  Surgeon: Kevin M Supple, MD;  Location: [facility];  Service: Orthopedics;  Laterality: Left;  . Electrophysiologic study N/A 02/12/2015    Procedure: Electrophysiology Study;  Surgeon: Ladasia Sircy C Nicloe Frontera, MD;  Location: [facility];  Service: Cardiovascular;  Laterality: N/A;    Current Outpatient Prescriptions  Medication Sig Dispense Refill  . apixaban (ELIQUIS) 5 MG TABS tablet Take 1 tablet (5 mg total) by mouth 2 (two) times daily. 60 tablet 6  . carvedilol (COREG) 25 MG tablet  TAKE (1) TABLET TWICE A DAY WITH MEALS (BREAKFAST AND SUPPER) 60 tablet 3  . Coenzyme Q10 (EQL COQ10) 300 MG CAPS Take 200 mg by mouth daily.     . furosemide (LASIX) 40 MG tablet Take 1 tablet (40 mg total) by mouth 2 (two) times daily. 60 tablet 0  . irbesartan (AVAPRO) 75 MG tablet Take 0.5 tablets (37.5 mg total) by mouth daily. 30 tablet 3  . isosorbide mononitrate (IMDUR) 60 MG 24 hr tablet Take 1 tablet (60 mg total) by mouth daily. 30 tablet 11  . MEGARED OMEGA-3 KRILL OIL 500 MG CAPS Take 1 tablet by mouth daily.    . metFORMIN (GLUCOPHAGE-XR) 500 MG 24 hr tablet Take 1,000 mg by mouth 2 (two) times daily.    . Multiple Vitamin (MULTIVITAMIN) tablet Take 1 tablet by mouth daily. CENTRUM SILVER    . potassium chloride 20 MEQ TBCR Take 20 mEq by mouth daily. 30 tablet 0   No current facility-administered medications for this visit.    Allergies  Allergen Reactions  . Ciprofloxacin Other (See Comments)    Severe chest pains  . Atorvastatin Other (See Comments)    REACTION: Myalgia  . Cedax [Ceftibuten] Other (See Comments)    Possibly nausea/vomiting, cyanosis, severe headaches  . Plavix [Clopidogrel Bisulfate] Other (See Comments)    Possibly nausea/vomiting,   cyanosis, severe headaches    . Statins Other (See Comments)    Myalgias, cyanosis  . Ace Inhibitors Other (See Comments)    REACTION: cough and sore throat    Review of Systems negative except from HPI and PMH  Physical Exam BP 132/62 mmHg  Pulse 70  Ht 5' 2" (1.575 m)  Wt 174 lb 6.4 oz (79.107 kg)  BMI 31.89 kg/m2 Well developed and well nourished in no acute distress HENT normal E scleral and icterus clear Neck Supple JVP<10; carotids brisk and full Clear to ausculation Device pocket well healed; without hematoma or erythema.  There is no tethering Regular rate and rhythm, no murmurs gallops or rub Soft with active bowel sounds No clubbing cyanosis no   Edema Alert and oriented, grossly normal motor and  sensory function Skin Warm and Dry  ECG demonstrates  P synchronous pacing with some atrial under sensing and pacing at the lower rate limit  Assessment and  Plan  Hypertension much improved  Complete heart block  Pacemaker-St. Jude The patient's device was interrogated.  The information was reviewed. No changes were made in the programming.    Coronary disease with prior LAD POBA Without symptoms of ischemia  Atrial flutter  Orthostatic intolerance  I think perhaps we overshot on her blood pressure control. She is now symptomatically orthostatic. We will change her Avapro to nighttime. We will cut her carvedilol and Imdur in half.  We will undertake generator replacement.We have reviewed the benefits and risks of generator replacement.  These include but are not limited to lead fracture and infection.  The patient understands, agrees and is willing to proceed.     

## 2015-04-07 NOTE — Discharge Instructions (Signed)

## 2015-04-07 NOTE — Interval H&P Note (Signed)
History and Physical Interval Note:  04/07/2015 12:17 PM  Brittany LundborgMarlyn M Wixted  has presented today for surgery, with the diagnosis of ERI  The various methods of treatment have been discussed with the patient and family. After consideration of risks, benefits and other options for treatment, the patient has consented to  Procedure(s): PPM Generator Changeout (N/A) as a surgical intervention .  The patient's history has been reviewed, patient examined, no change in status, stable for surgery.  I have reviewed the patient's chart and labs.  Questions were answered to the patient's satisfaction.     Sherryl MangesSteven Jahara Dail  We have reviewed the benefits and risks of generator replacement.  These include but are not limited to lead fracture and infection.  The patient understands, agrees and is willing to proceed.   Son present

## 2015-04-08 ENCOUNTER — Encounter (HOSPITAL_COMMUNITY): Payer: Self-pay | Admitting: Internal Medicine

## 2015-04-19 ENCOUNTER — Ambulatory Visit: Payer: Medicare Other

## 2015-04-21 ENCOUNTER — Ambulatory Visit: Payer: Medicare Other

## 2015-04-22 ENCOUNTER — Ambulatory Visit (INDEPENDENT_AMBULATORY_CARE_PROVIDER_SITE_OTHER): Payer: Medicare Other | Admitting: *Deleted

## 2015-04-22 ENCOUNTER — Encounter: Payer: Self-pay | Admitting: Internal Medicine

## 2015-04-22 DIAGNOSIS — Z45018 Encounter for adjustment and management of other part of cardiac pacemaker: Secondary | ICD-10-CM

## 2015-04-22 DIAGNOSIS — I442 Atrioventricular block, complete: Secondary | ICD-10-CM | POA: Diagnosis not present

## 2015-04-22 LAB — CUP PACEART INCLINIC DEVICE CHECK
Brady Statistic RV Percent Paced: 98 %
Date Time Interrogation Session: 20170413154442
Implantable Lead Implant Date: 20061229
Implantable Lead Location: 753859
Lead Channel Impedance Value: 887.5 Ohm
Lead Channel Pacing Threshold Amplitude: 1.5 V
Lead Channel Pacing Threshold Pulse Width: 0.5 ms
Lead Channel Pacing Threshold Pulse Width: 0.6 ms
Lead Channel Setting Pacing Amplitude: 1.75 V
Lead Channel Setting Pacing Pulse Width: 0.6 ms
Lead Channel Setting Sensing Sensitivity: 6 mV
MDC IDC LEAD IMPLANT DT: 20061229
MDC IDC LEAD LOCATION: 753860
MDC IDC MSMT BATTERY REMAINING LONGEVITY: 109.2
MDC IDC MSMT BATTERY VOLTAGE: 3.16 V
MDC IDC MSMT LEADCHNL RA IMPEDANCE VALUE: 387.5 Ohm
MDC IDC MSMT LEADCHNL RA PACING THRESHOLD AMPLITUDE: 1 V
MDC IDC MSMT LEADCHNL RA PACING THRESHOLD AMPLITUDE: 1 V
MDC IDC MSMT LEADCHNL RA PACING THRESHOLD PULSEWIDTH: 0.5 ms
MDC IDC MSMT LEADCHNL RA SENSING INTR AMPL: 1.6 mV
MDC IDC MSMT LEADCHNL RV PACING THRESHOLD AMPLITUDE: 1.75 V
MDC IDC MSMT LEADCHNL RV PACING THRESHOLD PULSEWIDTH: 0.6 ms
MDC IDC SET LEADCHNL RA PACING AMPLITUDE: 2 V
MDC IDC STAT BRADY RA PERCENT PACED: 50 %
Pulse Gen Model: 2240
Pulse Gen Serial Number: 7892876

## 2015-04-22 NOTE — Progress Notes (Signed)
Wound check appointment s/p PPM generator replacement 3/39/17. Dermabond removed. Wound without redness or edema. Incision edges approximated, wound well healed. Normal device function. Thresholds, sensing, and impedances consistent with implant measurements. Histogram distribution appropriate for patient and level of activity. No mode switches or high ventricular rates noted. Patient educated about wound care and Merlin monitoring. ROV with SK 07/08/15.

## 2015-07-08 ENCOUNTER — Encounter: Payer: Self-pay | Admitting: Internal Medicine

## 2015-07-08 ENCOUNTER — Ambulatory Visit (INDEPENDENT_AMBULATORY_CARE_PROVIDER_SITE_OTHER): Payer: Medicare Other | Admitting: Internal Medicine

## 2015-07-08 VITALS — BP 140/58 | HR 70 | Ht 62.0 in | Wt 175.8 lb

## 2015-07-08 DIAGNOSIS — I10 Essential (primary) hypertension: Secondary | ICD-10-CM

## 2015-07-08 DIAGNOSIS — I442 Atrioventricular block, complete: Secondary | ICD-10-CM | POA: Diagnosis not present

## 2015-07-08 DIAGNOSIS — Z950 Presence of cardiac pacemaker: Secondary | ICD-10-CM

## 2015-07-08 DIAGNOSIS — Z45018 Encounter for adjustment and management of other part of cardiac pacemaker: Secondary | ICD-10-CM | POA: Diagnosis not present

## 2015-07-08 DIAGNOSIS — I483 Typical atrial flutter: Secondary | ICD-10-CM | POA: Diagnosis not present

## 2015-07-08 LAB — CUP PACEART INCLINIC DEVICE CHECK
Brady Statistic RA Percent Paced: 57 %
Brady Statistic RV Percent Paced: 99 %
Date Time Interrogation Session: 20170629171110
Implantable Lead Implant Date: 20061229
Implantable Lead Model: 5076
Lead Channel Impedance Value: 387.5 Ohm
Lead Channel Impedance Value: 962.5 Ohm
Lead Channel Pacing Threshold Amplitude: 1.625 V
Lead Channel Pacing Threshold Pulse Width: 0.5 ms
Lead Channel Setting Pacing Amplitude: 2 V
Lead Channel Setting Sensing Sensitivity: 6 mV
MDC IDC LEAD IMPLANT DT: 20061229
MDC IDC LEAD LOCATION: 753859
MDC IDC LEAD LOCATION: 753860
MDC IDC MSMT BATTERY REMAINING LONGEVITY: 106.8
MDC IDC MSMT BATTERY VOLTAGE: 3.01 V
MDC IDC MSMT LEADCHNL RA PACING THRESHOLD AMPLITUDE: 0.75 V
MDC IDC MSMT LEADCHNL RA SENSING INTR AMPL: 0.6 mV
MDC IDC MSMT LEADCHNL RV PACING THRESHOLD PULSEWIDTH: 0.6 ms
MDC IDC SET LEADCHNL RV PACING AMPLITUDE: 1.875
MDC IDC SET LEADCHNL RV PACING PULSEWIDTH: 0.6 ms
Pulse Gen Serial Number: 7892876

## 2015-07-08 NOTE — Patient Instructions (Signed)
Medication Instructions: - Your physician recommends that you continue on your current medications as directed. Please refer to the Current Medication list given to you today.  Labwork: - none  Procedures/Testing: - none  Follow-Up: - Remote monitoring is used to monitor your Pacemaker of ICD from home. This monitoring reduces the number of office visits required to check your device to one time per year. It allows us to keep an eye on the functioning of your device to ensure it is working properly. You are scheduled for a device check from home on 10/07/15. You may send your transmission at any time that day. If you have a wireless device, the transmission will be sent automatically. After your physician reviews your transmission, you will receive a postcard with your next transmission date.  - Your physician wants you to follow-up in: 9 months with Gypsy BalsamAmber Seiler, NP. You will receive a reminder letter in the mail two months in advance. If you don't receive a letter, please call our office to schedule the follow-up appointment.   Any Additional Special Instructions Will Be Listed Below (If Applicable).     If you need a refill on your cardiac medications before your next appointment, please call your pharmacy.

## 2015-07-08 NOTE — Progress Notes (Signed)
Patient Care Team: Joette CatchingLeonard Nyland, MD as PCP - General (Family Medicine) Duke SalviaSteven C Klein, MD (Cardiology)   HPI  Brittany NationsMarlyn Jaquita RectorM Masri is a 82 y.o. female is seen in followup for complete heart block; she is status post pacemaker implantation and recently underwent generator replacement     She has a history of prior LAD POBA with normal left ventricular function.    She has a history of atrial flutter flutter that appeared to be typical. Entrainment mapping, however, demonstrated a left atrial flutter and she was there  cardioverted. She is on anticoagulation   The patient denies chest pain, shortness of breath, nocturnal dyspnea, orthopnea or peripheral edema.  There have been no palpitations, lightheadedness or syncope.    Echocardiogram 12/16 demonstrated normal LV function and moderate-severe LAE (45/22.2/45)  Past Medical History  Diagnosis Date  . CAD (coronary artery disease)   . Pacemaker     STJ pacer, Dr. Ladona Ridgelaylor, 2006  . AV block   . Hyperlipidemia   . Hypertension   . Diabetes mellitus     Type II  . Glaucoma     Past Surgical History  Procedure Laterality Date  . Tonsillectomy  1937  . Breast biopsy  1991  . Left total knee replacement  1996  . Pacemaker insertion      St. Jude  . Femur im nail  01/18/2012    Procedure: INTRAMEDULLARY (IM) NAIL FEMORAL;  Surgeon: Senaida LangeKevin M Supple, MD;  Location: [facility];  Service: Orthopedics;  Laterality: Left;  . Electrophysiologic study N/A 02/12/2015    Procedure: Electrophysiology Study;  Surgeon: Duke SalviaSteven C Klein, MD;  [facility];  Service: Cardiovascular;  Laterality: N/A;  . Ep implantable device N/A 04/07/2015    Procedure: PPM Generator Changeout;  Surgeon: Duke SalviaSteven C Klein, MD;  [facility];  Service: Cardiovascular;  Laterality: N/A;    Current Outpatient Prescriptions  Medication Sig Dispense Refill  . apixaban (ELIQUIS) 5 MG TABS tablet Take 1 tablet (5 mg total) by mouth 2 (two)  times daily. 60 tablet 6  . carvedilol (COREG) 25 MG tablet Take 0.5 tablets (12.5 mg total) by mouth 2 (two) times daily with a meal. 30 tablet 6  . Coenzyme Q10 (COQ10 PO) Take 1 capsule by mouth daily.    . furosemide (LASIX) 40 MG tablet Take 1 tablet (40 mg) by mouth in the AM and take 1/2 tablet (20 mg) in the PM    . irbesartan (AVAPRO) 75 MG tablet Take 0.5 tablets (37.5 mg total) by mouth at bedtime. 30 tablet 3  . isosorbide mononitrate (IMDUR) 30 MG 24 hr tablet Take 1 tablet (30 mg total) by mouth daily. 30 tablet 6  . MEGARED OMEGA-3 KRILL OIL 500 MG CAPS Take 1 tablet by mouth daily.    . metFORMIN (GLUCOPHAGE-XR) 500 MG 24 hr tablet Take 1,000 mg by mouth 2 (two) times daily.    . Multiple Vitamin (MULTIVITAMIN) tablet Take 1 tablet by mouth daily. CENTRUM SILVER    . Omega-3 Fatty Acids (OMEGA-3 PO) Take 1 capsule by mouth daily.    . potassium chloride 20 MEQ TBCR Take 20 mEq by mouth daily. 30 tablet 0   No current facility-administered medications for this visit.    Allergies  Allergen Reactions  . Ciprofloxacin Other (See Comments)    Severe chest pains  . Ace Inhibitors Other (See Comments)    REACTION: cough and sore throat  .  Atorvastatin Other (See Comments)    REACTION: Myalgia  . Cedax [Ceftibuten] Other (See Comments)    Possibly nausea/vomiting, cyanosis, severe headaches  . Plavix [Clopidogrel Bisulfate] Other (See Comments)    Possibly nausea/vomiting, cyanosis, severe headaches    . Statins Other (See Comments)    Myalgias, cyanosis    Review of Systems negative except from HPI and PMH  Physical Exam BP 140/58 mmHg  Pulse 70  Ht 5\' 2"  (1.575 m)  Wt 175 lb 12.8 oz (79.742 kg)  BMI 32.15 kg/m2  SpO2 96% Well developed and well nourished in no acute distress HENT normal E scleral and icterus clear Neck Supple JVP<10; carotids brisk and full Clear to ausculation Device pocket well healed; without hematoma or erythema.  There is no tethering  Regular rate and rhythm, no murmurs gallops or rub Soft with active bowel sounds No clubbing cyanosis no   Edema Alert and oriented, grossly normal motor and sensory function Skin Warm and Dry  ECG demonstrates  P synchronous pacing with some atrial under sensing and pacing at the lower rate limit  Assessment and  Plan  Hypertension much improved  Complete heart block  Pacemaker-St. Jude The patient's device was interrogated.  The information was reviewed. No changes were made in the programming.    Coronary disease with prior LAD POBA Without symptoms of ischemia  Atrial flutter  No intercurrent atrial fibrillation or flutter  ,Without symptoms of ischemia

## 2015-09-21 ENCOUNTER — Other Ambulatory Visit: Payer: Self-pay | Admitting: Physician Assistant

## 2015-10-07 ENCOUNTER — Telehealth: Payer: Self-pay | Admitting: Cardiology

## 2015-10-07 ENCOUNTER — Encounter: Payer: Medicare Other | Admitting: *Deleted

## 2015-10-07 NOTE — Telephone Encounter (Signed)
Spoke with pt and reminded pt of remote transmission that is due today. Pt verbalized understanding.   

## 2015-10-08 ENCOUNTER — Encounter: Payer: Self-pay | Admitting: Cardiology

## 2015-10-11 ENCOUNTER — Ambulatory Visit (INDEPENDENT_AMBULATORY_CARE_PROVIDER_SITE_OTHER): Payer: Medicare Other | Admitting: *Deleted

## 2015-10-11 DIAGNOSIS — I442 Atrioventricular block, complete: Secondary | ICD-10-CM | POA: Diagnosis not present

## 2015-10-11 NOTE — Progress Notes (Signed)
Remote pacemaker transmission.   

## 2015-10-13 ENCOUNTER — Encounter: Payer: Self-pay | Admitting: Cardiology

## 2015-10-21 LAB — CUP PACEART REMOTE DEVICE CHECK
Battery Voltage: 3.01 V
Brady Statistic AP VS Percent: 1 %
Brady Statistic AS VP Percent: 38 %
Brady Statistic AS VS Percent: 1 %
Brady Statistic RV Percent Paced: 99 %
Implantable Lead Implant Date: 20061229
Implantable Lead Location: 753860
Lead Channel Pacing Threshold Amplitude: 0.75 V
Lead Channel Pacing Threshold Amplitude: 1.625 V
Lead Channel Pacing Threshold Pulse Width: 0.6 ms
Lead Channel Sensing Intrinsic Amplitude: 0.7 mV
Lead Channel Sensing Intrinsic Amplitude: 11.2 mV
Lead Channel Setting Pacing Amplitude: 1.875
Lead Channel Setting Pacing Amplitude: 2 V
Lead Channel Setting Pacing Pulse Width: 0.6 ms
MDC IDC LEAD IMPLANT DT: 20061229
MDC IDC LEAD LOCATION: 753859
MDC IDC MSMT BATTERY REMAINING LONGEVITY: 102 mo
MDC IDC MSMT BATTERY REMAINING PERCENTAGE: 95.5 %
MDC IDC MSMT LEADCHNL RA IMPEDANCE VALUE: 380 Ohm
MDC IDC MSMT LEADCHNL RA PACING THRESHOLD PULSEWIDTH: 0.5 ms
MDC IDC MSMT LEADCHNL RV IMPEDANCE VALUE: 910 Ohm
MDC IDC PG SERIAL: 7892876
MDC IDC SESS DTM: 20171001060014
MDC IDC SET LEADCHNL RV SENSING SENSITIVITY: 6 mV
MDC IDC STAT BRADY AP VP PERCENT: 61 %
MDC IDC STAT BRADY RA PERCENT PACED: 47 %
Pulse Gen Model: 2240

## 2015-12-13 ENCOUNTER — Other Ambulatory Visit: Payer: Self-pay | Admitting: Internal Medicine

## 2015-12-16 ENCOUNTER — Other Ambulatory Visit: Payer: Self-pay | Admitting: Internal Medicine

## 2016-01-13 ENCOUNTER — Ambulatory Visit (INDEPENDENT_AMBULATORY_CARE_PROVIDER_SITE_OTHER): Payer: Medicare Other | Admitting: *Deleted

## 2016-01-13 DIAGNOSIS — I442 Atrioventricular block, complete: Secondary | ICD-10-CM

## 2016-01-13 NOTE — Progress Notes (Signed)
Remote pacemaker transmission.   

## 2016-01-14 ENCOUNTER — Encounter: Payer: Self-pay | Admitting: Cardiology

## 2016-01-31 LAB — CUP PACEART REMOTE DEVICE CHECK
Battery Remaining Longevity: 102 mo
Brady Statistic AP VS Percent: 1 %
Brady Statistic AS VP Percent: 35 %
Brady Statistic RA Percent Paced: 41 %
Implantable Lead Implant Date: 20061229
Implantable Lead Location: 753859
Lead Channel Impedance Value: 390 Ohm
Lead Channel Pacing Threshold Pulse Width: 0.5 ms
Lead Channel Sensing Intrinsic Amplitude: 0.7 mV
Lead Channel Sensing Intrinsic Amplitude: 11.2 mV
Lead Channel Setting Pacing Amplitude: 2 V
MDC IDC LEAD IMPLANT DT: 20061229
MDC IDC LEAD LOCATION: 753860
MDC IDC MSMT BATTERY REMAINING PERCENTAGE: 95.5 %
MDC IDC MSMT BATTERY VOLTAGE: 3.01 V
MDC IDC MSMT LEADCHNL RA PACING THRESHOLD AMPLITUDE: 0.75 V
MDC IDC MSMT LEADCHNL RV IMPEDANCE VALUE: 960 Ohm
MDC IDC MSMT LEADCHNL RV PACING THRESHOLD AMPLITUDE: 1.5 V
MDC IDC MSMT LEADCHNL RV PACING THRESHOLD PULSEWIDTH: 0.6 ms
MDC IDC PG IMPLANT DT: 20170329
MDC IDC PG SERIAL: 7892876
MDC IDC SESS DTM: 20180104070012
MDC IDC SET LEADCHNL RV PACING AMPLITUDE: 1.75 V
MDC IDC SET LEADCHNL RV PACING PULSEWIDTH: 0.6 ms
MDC IDC SET LEADCHNL RV SENSING SENSITIVITY: 6 mV
MDC IDC STAT BRADY AP VP PERCENT: 65 %
MDC IDC STAT BRADY AS VS PERCENT: 1 %
MDC IDC STAT BRADY RV PERCENT PACED: 99 %

## 2016-03-22 ENCOUNTER — Encounter: Payer: Self-pay | Admitting: Nurse Practitioner

## 2016-03-29 NOTE — Progress Notes (Signed)
Electrophysiology Office Note Date: 03/30/2016  ID:  Brittany [name] July 25, 1927, MRN [id number]  PCP: Josue Hector, MD Electrophysiologist: Graciela Husbands  CC: Pacemaker follow-up  Brittany [name] is a 83 y.o. female seen today for Dr Graciela Husbands.  She presents today for routine electrophysiology followup.  Since last being seen in our clinic, the patient reports doing very well.  She denies chest pain, palpitations, dyspnea, PND, orthopnea, nausea, vomiting, dizziness, syncope, edema, weight gain, or early satiety.  Device History: STJ dual chamber PPM implanted 2006 for complete heart block; gen change 2017   Past Medical History:  Diagnosis Date  . Atrial flutter (HCC)   . CAD (coronary artery disease)   . Complete heart block (HCC)    a. s/p STJ dual chamber PPM  . Glaucoma   . Hyperlipidemia   . Hypertension   . Type II diabetes mellitus (HCC)    Past Surgical History:  Procedure Laterality Date  . BREAST BIOPSY  1991  . ELECTROPHYSIOLOGIC STUDY N/A 02/12/2015   cardioversion by Dr Graciela Husbands   . EP IMPLANTABLE DEVICE N/A 04/07/2015   a. STJ dual chamber PPM implanted 2006 for complete heart block b. gen change 2017  . FEMUR IM NAIL  01/18/2012   Procedure: INTRAMEDULLARY (IM) NAIL FEMORAL;  Surgeon: Senaida Lange, MD;  Location: [facility] OR;  Service: Orthopedics;  Laterality: Left;  . left total knee replacement  1996  . TONSILLECTOMY  1937    Current Outpatient Prescriptions  Medication Sig Dispense Refill  . carvedilol (COREG) 25 MG tablet Take 0.5 tablets (12.5 mg total) by mouth 2 (two) times daily. 30 tablet 5  . Coenzyme Q10 (COQ10 PO) Take 1 capsule by mouth daily.    Marland Kitchen ELIQUIS 5 MG TABS tablet Take 1 tablet (5 mg total) by mouth 2 (two) times daily. 60 tablet 9  . furosemide (LASIX) 40 MG tablet Take 1 tablet (40 mg) by mouth in the AM and take 1/2 tablet (20 mg) in the PM    . irbesartan (AVAPRO) 75 MG tablet Take 0.5 tablets (37.5 mg total) by mouth at bedtime. 30  tablet 3  . isosorbide mononitrate (IMDUR) 30 MG 24 hr tablet TAKE 1 TABLET DAILY 30 tablet 6  . MEGARED OMEGA-3 KRILL OIL 500 MG CAPS Take 1 tablet by mouth daily.    . metFORMIN (GLUCOPHAGE-XR) 500 MG 24 hr tablet Take 1,000 mg by mouth 2 (two) times daily.    . Multiple Vitamin (MULTIVITAMIN) tablet Take 1 tablet by mouth daily. CENTRUM SILVER    . Omega-3 Fatty Acids (OMEGA-3 PO) Take 1 capsule by mouth daily.    . potassium chloride 20 MEQ TBCR Take 20 mEq by mouth daily. 30 tablet 0   No current facility-administered medications for this visit.     Allergies:   Ciprofloxacin; Ace inhibitors; Atorvastatin; Cedax [ceftibuten]; Plavix [clopidogrel bisulfate]; and Statins   Social History: Social History   Social History  . Marital status: Divorced    Spouse name: N/A  . Number of children: N/A  . Years of education: N/A   Occupational History  . Retired    Social History Main Topics  . Smoking status: Former Smoker    Types: Cigarettes    Quit date: 01/17/1951  . Smokeless tobacco: Never Used  . Alcohol use Yes     Comment: Occasional glass of wine  . Drug use: No  . Sexual activity: Not on file   Other Topics Concern  . Not  on file   Social History Narrative  . No narrative on file    Family History: Family History  Problem Relation Age of Onset  . Cancer    . Coronary artery disease    . Stroke    . Diabetes    . Hypertension       Review of Systems: All other systems reviewed and are otherwise negative except as noted above.   Physical Exam: VS:  BP (!) 178/66   Pulse 72   Ht 5' (1.524 m)   Wt 183 lb (83 kg)   BMI 35.74 kg/m  , BMI Body mass index is 35.74 kg/m.  GEN- The patient is elderly appearing, alert and oriented x 3 today.   HEENT: normocephalic, atraumatic; sclera clear, conjunctiva pink; hearing intact; oropharynx clear; neck supple  Lungs- Clear to ausculation bilaterally, normal work of breathing.  No wheezes, rales, rhonchi Heart-  Regular rate and rhythm  GI- soft, non-tender, non-distended, bowel sounds present  Extremities- no clubbing, cyanosis, or edema MS- no significant deformity or atrophy Skin- warm and dry, no rash or lesion; PPM pocket well healed Psych- euthymic mood, full affect Neuro- strength and sensation are intact  PPM Interrogation- reviewed in detail today,  See PACEART report  EKG:  EKG is not ordered today.  Recent Labs: No results found for requested labs within last 8760 hours.   Wt Readings from Last 3 Encounters:  03/30/16 183 lb (83 kg)  07/08/15 175 lb 12.8 oz (79.7 kg)  04/07/15 175 lb (79.4 kg)     Other studies Reviewed: Additional studies/ records that were reviewed today include: Dr Odessa FlemingKlein's office notes  Assessment and Plan:  1.  Complete heart block Normal PPM function See Pace Art report No changes today  2.  Left sided atrial flutter Continue Eliquis for CHADS2VASC of 5 CBC, BMET followed by PCP  3.  HTN Stable No change required today BP at home 140's  Current medicines are reviewed at length with the patient today.   The patient does not have concerns regarding her medicines.  The following changes were made today:  none  Labs/ tests ordered today include: none No orders of the defined types were placed in this encounter.    Disposition:   Follow up with Merlin, me in 1 year     Signed, Gypsy BalsamAmber Seiler, NP 03/30/2016 12:09 PM  [facility] [address] [place] [address] [phone] (office) [phone] (fax)

## 2016-03-30 ENCOUNTER — Encounter: Payer: Self-pay | Admitting: Nurse Practitioner

## 2016-03-30 ENCOUNTER — Ambulatory Visit (INDEPENDENT_AMBULATORY_CARE_PROVIDER_SITE_OTHER): Payer: Medicare Other | Admitting: Nurse Practitioner

## 2016-03-30 VITALS — BP 178/66 | HR 72 | Ht 60.0 in | Wt 183.0 lb

## 2016-03-30 DIAGNOSIS — I484 Atypical atrial flutter: Secondary | ICD-10-CM | POA: Diagnosis not present

## 2016-03-30 DIAGNOSIS — I10 Essential (primary) hypertension: Secondary | ICD-10-CM

## 2016-03-30 DIAGNOSIS — I442 Atrioventricular block, complete: Secondary | ICD-10-CM | POA: Diagnosis not present

## 2016-03-30 LAB — CUP PACEART INCLINIC DEVICE CHECK
Implantable Lead Implant Date: 20061229
Implantable Lead Location: 753860
Implantable Pulse Generator Implant Date: 20170329
MDC IDC LEAD IMPLANT DT: 20061229
MDC IDC LEAD LOCATION: 753859
MDC IDC PG SERIAL: 7892876
MDC IDC SESS DTM: 20180322124314
Pulse Gen Model: 2240

## 2016-03-30 NOTE — Patient Instructions (Signed)
Medication Instructions:   Your physician recommends that you continue on your current medications as directed. Please refer to the Current Medication list given to you today.  If you need a refill on your cardiac medications before your next appointment, please call your pharmacy.  Labwork:  NONE ORDERED  TODAY    Testing/Procedures:  NONE ORDERED  TODAY    Follow-Up:  Your physician wants you to follow-up in: ONE YEAR WITH Brittany [name]  You will receive a reminder letter in the mail two months in advance. If you don't receive a letter, please call our office to schedule the follow-up appointment.   Remote monitoring is used to monitor your Pacemaker of ICD from home. This monitoring reduces the number of office visits required to check your device to one time per year. It allows us to keep an eye on the functioning of your device to ensure it is working properly. You are scheduled for a device check from home on .  6-25-2018You may send your transmission at any time that day. If you have a wireless device, the transmission will be sent automatically. After your physician reviews your transmission, you will receive a postcard with your next transmission date.     Any Other Special Instructions Will Be Listed Below (If Applicable).

## 2016-04-17 ENCOUNTER — Other Ambulatory Visit: Payer: Self-pay | Admitting: Internal Medicine

## 2016-06-13 ENCOUNTER — Other Ambulatory Visit: Payer: Self-pay | Admitting: Internal Medicine

## 2016-07-03 ENCOUNTER — Ambulatory Visit (INDEPENDENT_AMBULATORY_CARE_PROVIDER_SITE_OTHER): Payer: Medicare Other | Admitting: *Deleted

## 2016-07-03 DIAGNOSIS — I442 Atrioventricular block, complete: Secondary | ICD-10-CM | POA: Diagnosis not present

## 2016-07-03 NOTE — Progress Notes (Signed)
Remote pacemaker transmission.   

## 2016-07-05 ENCOUNTER — Encounter: Payer: Self-pay | Admitting: Cardiology

## 2016-07-06 LAB — CUP PACEART REMOTE DEVICE CHECK
Battery Voltage: 2.99 V
Brady Statistic AP VP Percent: 76 %
Brady Statistic AS VS Percent: 1 %
Brady Statistic RV Percent Paced: 99 %
Date Time Interrogation Session: 20180625060016
Implantable Lead Implant Date: 20061229
Implantable Lead Implant Date: 20061229
Implantable Lead Location: 753860
Implantable Lead Model: 5076
Implantable Pulse Generator Implant Date: 20170329
Lead Channel Impedance Value: 910 Ohm
Lead Channel Pacing Threshold Amplitude: 0.75 V
Lead Channel Pacing Threshold Pulse Width: 0.6 ms
Lead Channel Sensing Intrinsic Amplitude: 0.8 mV
Lead Channel Setting Pacing Amplitude: 1.5 V
Lead Channel Setting Pacing Pulse Width: 0.6 ms
MDC IDC LEAD LOCATION: 753859
MDC IDC MSMT BATTERY REMAINING LONGEVITY: 107 mo
MDC IDC MSMT BATTERY REMAINING PERCENTAGE: 95.5 %
MDC IDC MSMT LEADCHNL RA IMPEDANCE VALUE: 380 Ohm
MDC IDC MSMT LEADCHNL RA PACING THRESHOLD PULSEWIDTH: 0.5 ms
MDC IDC MSMT LEADCHNL RV PACING THRESHOLD AMPLITUDE: 1.25 V
MDC IDC MSMT LEADCHNL RV SENSING INTR AMPL: 12 mV
MDC IDC SET LEADCHNL RA PACING AMPLITUDE: 2 V
MDC IDC SET LEADCHNL RV SENSING SENSITIVITY: 6 mV
MDC IDC STAT BRADY AP VS PERCENT: 1 %
MDC IDC STAT BRADY AS VP PERCENT: 24 %
MDC IDC STAT BRADY RA PERCENT PACED: 38 %
Pulse Gen Model: 2240
Pulse Gen Serial Number: 7892876

## 2016-07-10 ENCOUNTER — Other Ambulatory Visit: Payer: Self-pay | Admitting: Internal Medicine

## 2016-10-02 ENCOUNTER — Ambulatory Visit (INDEPENDENT_AMBULATORY_CARE_PROVIDER_SITE_OTHER): Payer: Medicare Other | Admitting: *Deleted

## 2016-10-02 DIAGNOSIS — I442 Atrioventricular block, complete: Secondary | ICD-10-CM

## 2016-10-03 NOTE — Progress Notes (Signed)
Remote pacemaker transmission.   

## 2016-10-05 ENCOUNTER — Encounter: Payer: Self-pay | Admitting: Cardiology

## 2016-10-06 LAB — CUP PACEART REMOTE DEVICE CHECK
Battery Remaining Percentage: 95.5 %
Brady Statistic AP VP Percent: 74 %
Brady Statistic AP VS Percent: 1 %
Brady Statistic RA Percent Paced: 54 %
Brady Statistic RV Percent Paced: 99 %
Date Time Interrogation Session: 20180924060012
Implantable Lead Implant Date: 20061229
Implantable Pulse Generator Implant Date: 20170329
Lead Channel Impedance Value: 830 Ohm
Lead Channel Pacing Threshold Amplitude: 1.5 V
Lead Channel Sensing Intrinsic Amplitude: 0.6 mV
Lead Channel Setting Pacing Amplitude: 2 V
Lead Channel Setting Pacing Pulse Width: 0.6 ms
Lead Channel Setting Sensing Sensitivity: 6 mV
MDC IDC LEAD IMPLANT DT: 20061229
MDC IDC LEAD LOCATION: 753859
MDC IDC LEAD LOCATION: 753860
MDC IDC MSMT BATTERY REMAINING LONGEVITY: 103 mo
MDC IDC MSMT BATTERY VOLTAGE: 2.99 V
MDC IDC MSMT LEADCHNL RA IMPEDANCE VALUE: 360 Ohm
MDC IDC MSMT LEADCHNL RA PACING THRESHOLD AMPLITUDE: 0.75 V
MDC IDC MSMT LEADCHNL RA PACING THRESHOLD PULSEWIDTH: 0.5 ms
MDC IDC MSMT LEADCHNL RV PACING THRESHOLD PULSEWIDTH: 0.6 ms
MDC IDC MSMT LEADCHNL RV SENSING INTR AMPL: 12 mV
MDC IDC PG SERIAL: 7892876
MDC IDC SET LEADCHNL RV PACING AMPLITUDE: 1.75 V
MDC IDC STAT BRADY AS VP PERCENT: 26 %
MDC IDC STAT BRADY AS VS PERCENT: 1 %

## 2016-11-16 IMAGING — DX DG CHEST 2V
2 series · 2 of 2 positions shown · non-contrast
Comparison: 10/15/2013

CLINICAL DATA: Left-sided chest pressure since yesterday.

EXAM:
CHEST  2 VIEW

[chest pa]
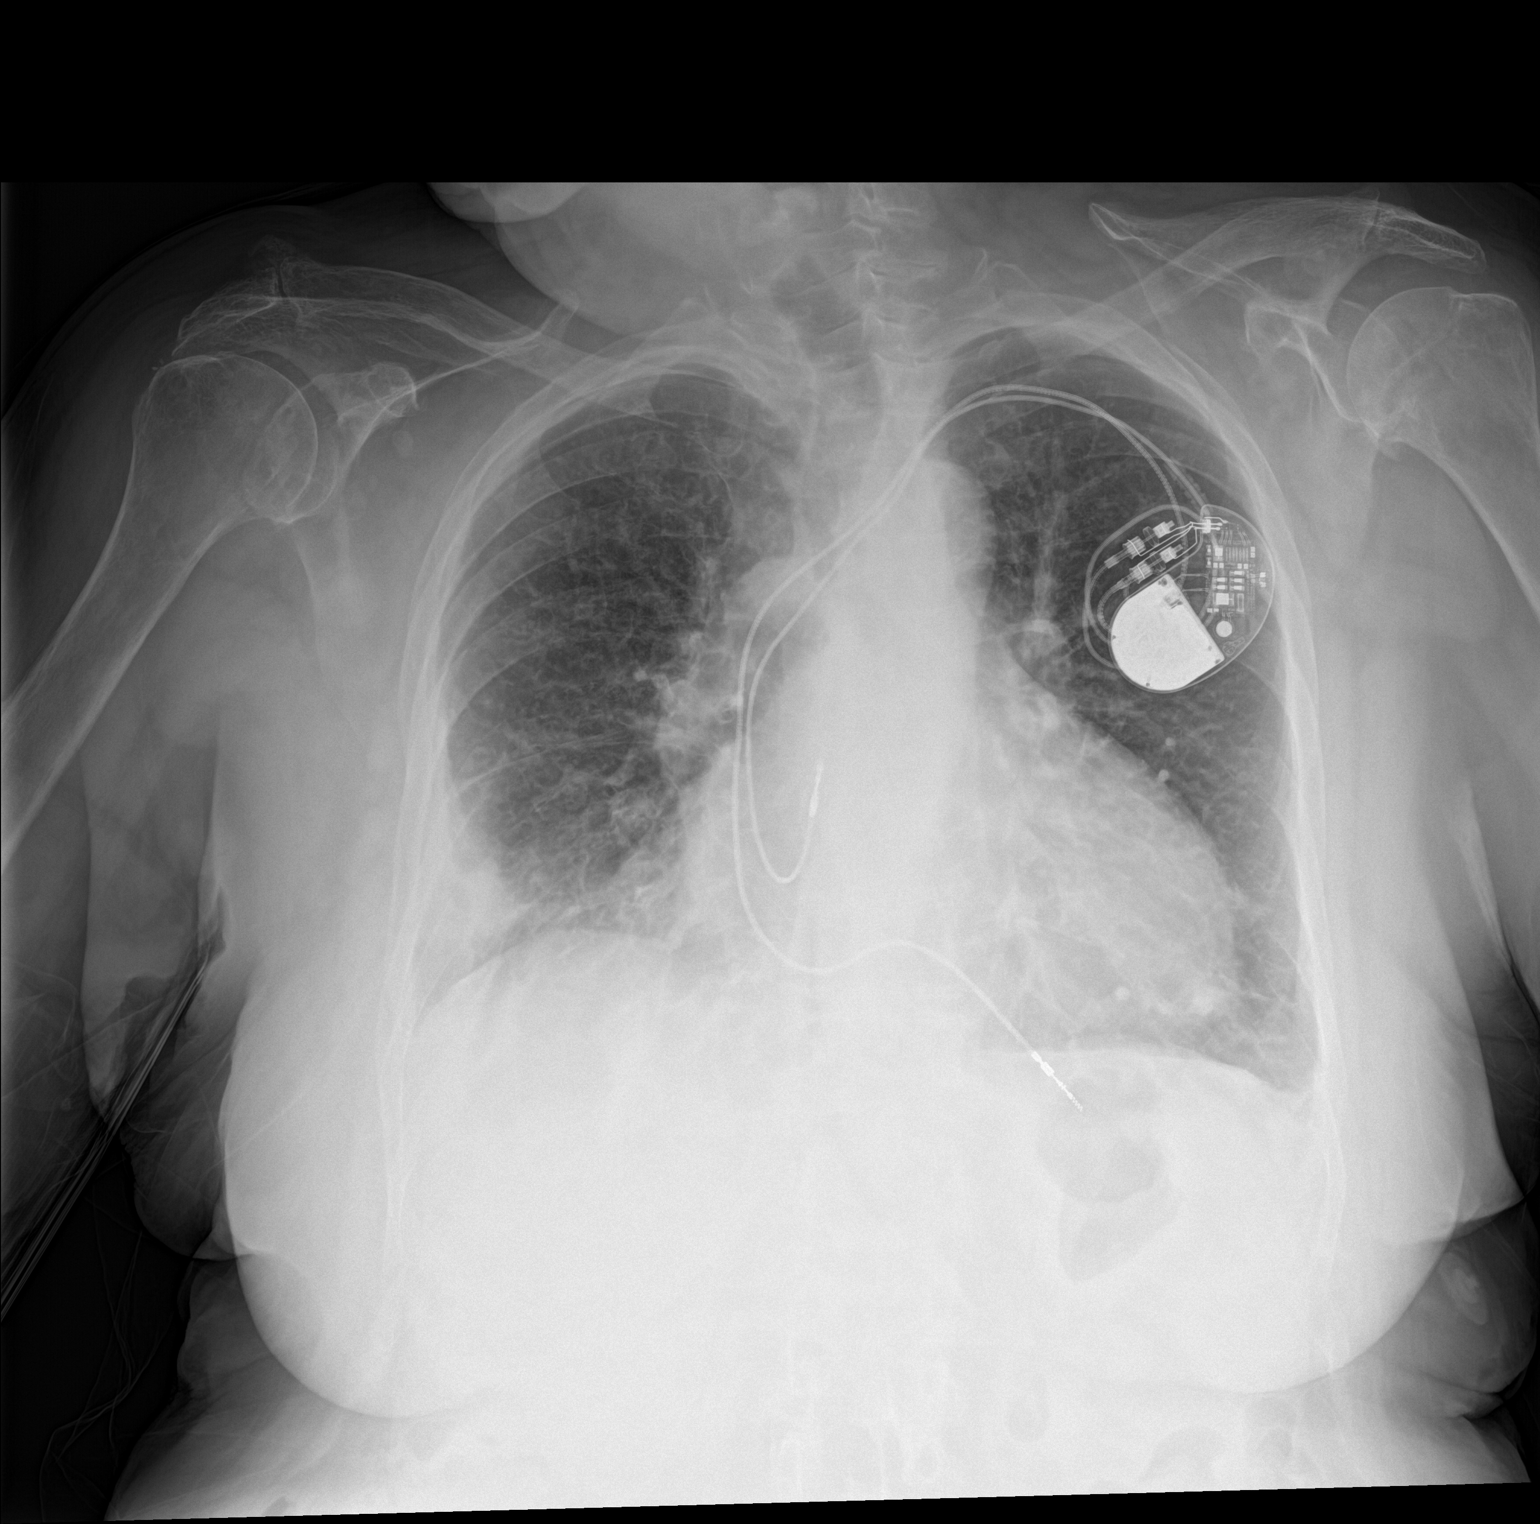

[chest lat]
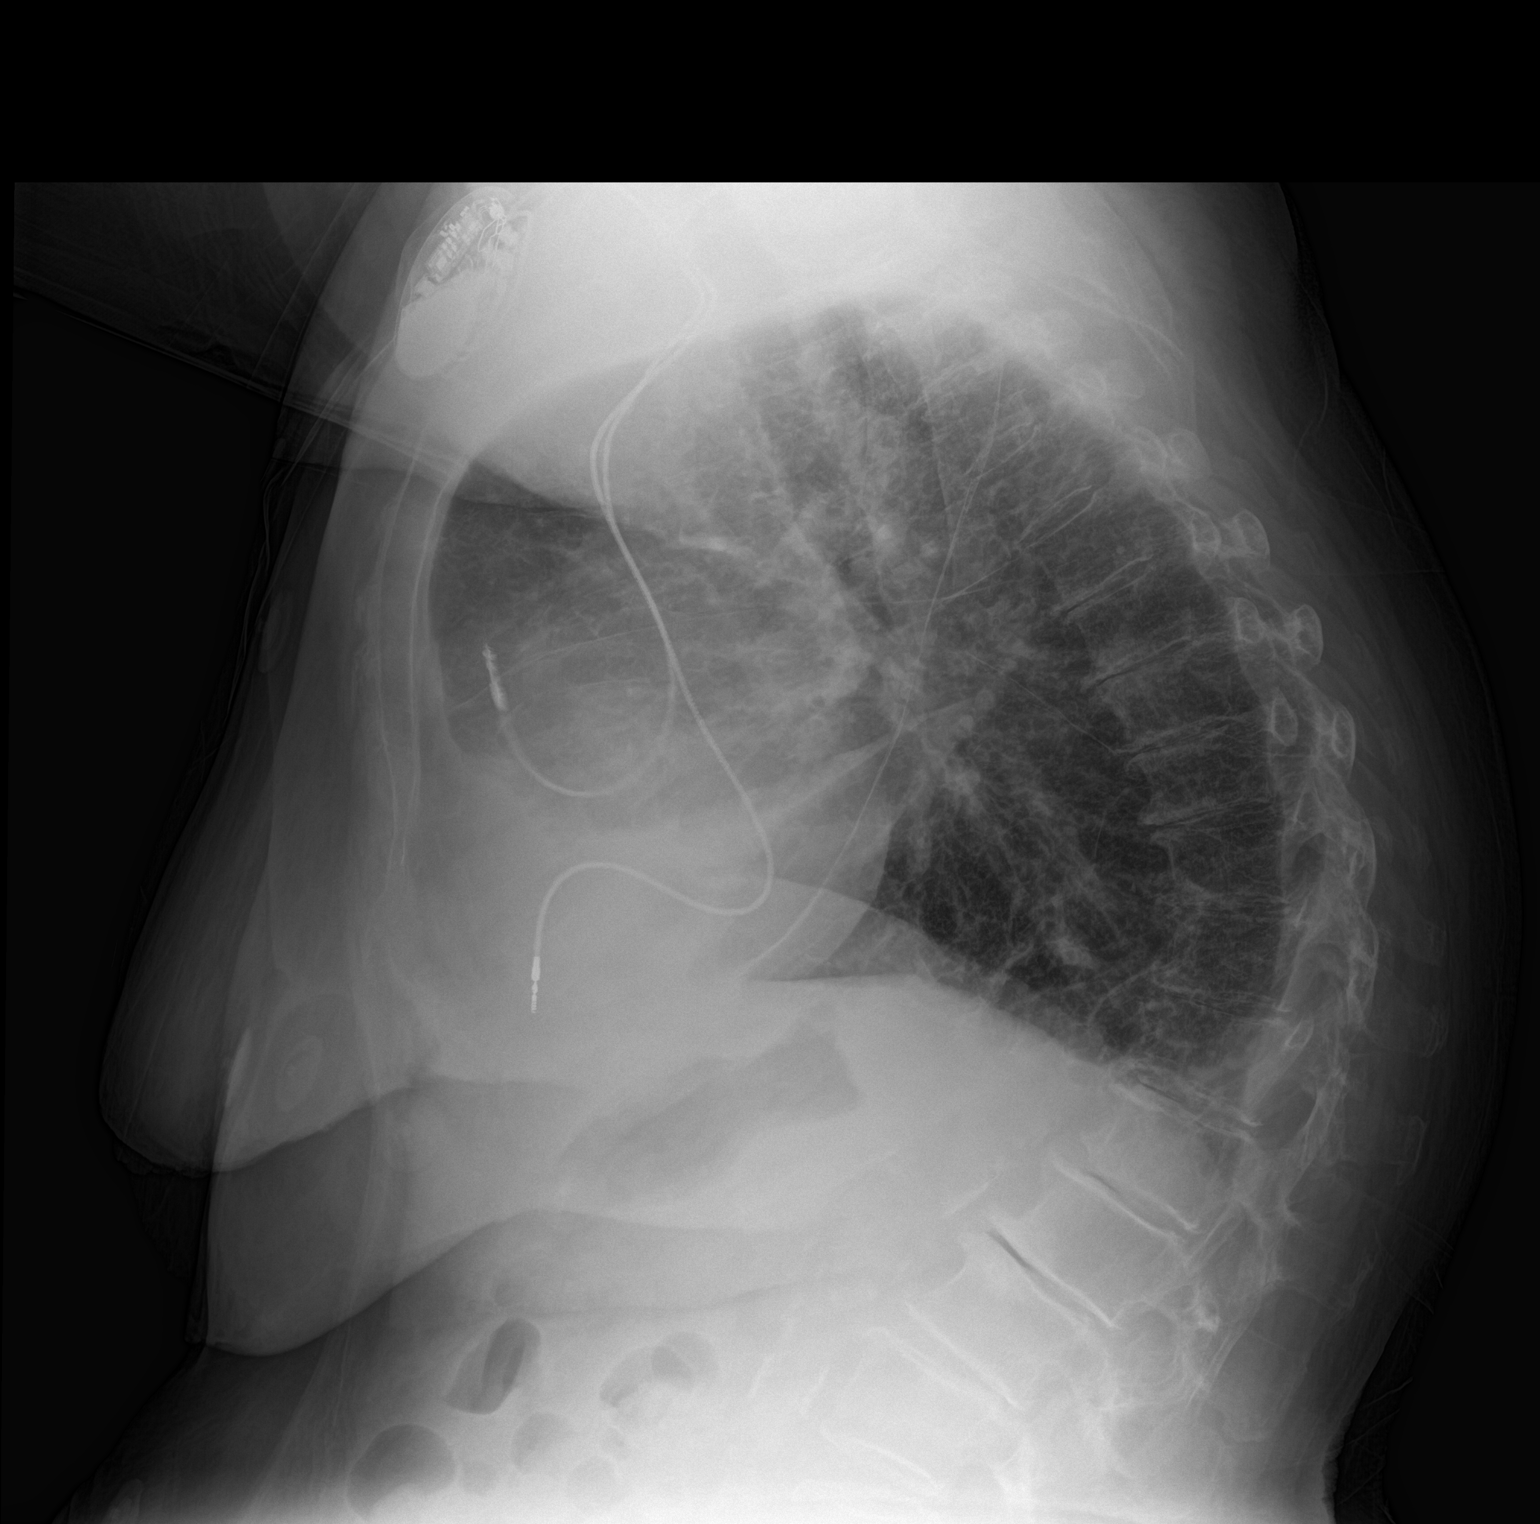

[2 of 2 positions shown; findings below may reference images not displayed]

FINDINGS: Dual lead cardiac pacemaker is stable.

Cardiomediastinal silhouette is enlarged. Mediastinal contours
appear intact.

There is coarsening of the interstitial markings. Airspace
consolidation is seen within the right lower lobe. There is a small
right pleural effusion. No evidence of pneumothorax or left pleural
effusion.

Osseous structures are without acute abnormality. Soft tissues are
grossly normal.
IMPRESSION: Enlarged cardiac silhouette with interstitial pulmonary edema.

Small right pleural effusion with right basilar atelectasis versus
airspace consolidation.

## 2016-12-11 ENCOUNTER — Other Ambulatory Visit: Payer: Self-pay | Admitting: Nurse Practitioner

## 2017-01-01 ENCOUNTER — Ambulatory Visit (INDEPENDENT_AMBULATORY_CARE_PROVIDER_SITE_OTHER): Payer: Medicare Other | Admitting: *Deleted

## 2017-01-01 DIAGNOSIS — I442 Atrioventricular block, complete: Secondary | ICD-10-CM | POA: Diagnosis not present

## 2017-01-03 ENCOUNTER — Encounter: Payer: Self-pay | Admitting: Cardiology

## 2017-01-03 NOTE — Progress Notes (Signed)
Remote pacemaker transmission.   

## 2017-01-04 ENCOUNTER — Other Ambulatory Visit: Payer: Self-pay | Admitting: Internal Medicine

## 2017-01-04 LAB — CUP PACEART REMOTE DEVICE CHECK
Battery Remaining Longevity: 101 mo
Brady Statistic AP VP Percent: 79 %
Brady Statistic AP VS Percent: 1 %
Brady Statistic AS VP Percent: 21 %
Brady Statistic RV Percent Paced: 99 %
Implantable Lead Implant Date: 20061229
Implantable Lead Location: 753860
Implantable Lead Model: 5076
Lead Channel Impedance Value: 380 Ohm
Lead Channel Impedance Value: 810 Ohm
Lead Channel Pacing Threshold Amplitude: 1.5 V
Lead Channel Sensing Intrinsic Amplitude: 12 mV
Lead Channel Setting Pacing Amplitude: 1.75 V
Lead Channel Setting Pacing Amplitude: 2 V
Lead Channel Setting Pacing Pulse Width: 0.6 ms
Lead Channel Setting Sensing Sensitivity: 6 mV
MDC IDC LEAD IMPLANT DT: 20061229
MDC IDC LEAD LOCATION: 753859
MDC IDC MSMT BATTERY REMAINING PERCENTAGE: 95.5 %
MDC IDC MSMT BATTERY VOLTAGE: 2.99 V
MDC IDC MSMT LEADCHNL RA PACING THRESHOLD AMPLITUDE: 0.75 V
MDC IDC MSMT LEADCHNL RA PACING THRESHOLD PULSEWIDTH: 0.5 ms
MDC IDC MSMT LEADCHNL RA SENSING INTR AMPL: 0.6 mV
MDC IDC MSMT LEADCHNL RV PACING THRESHOLD PULSEWIDTH: 0.6 ms
MDC IDC PG IMPLANT DT: 20170329
MDC IDC PG SERIAL: 7892876
MDC IDC SESS DTM: 20181224070012
MDC IDC STAT BRADY AS VS PERCENT: 1 %
MDC IDC STAT BRADY RA PERCENT PACED: 64 %

## 2017-01-04 NOTE — Telephone Encounter (Signed)
Eliquis 5mg  refill request received; pt is 83 yrs old, wt-83kg, Crea-0.64 via [facility] on 12/06/16, last seen by Gypsy BalsamAmber Seiler on 03/30/16; will send in refill request to requested Pharmacy.

## 2017-01-17 NOTE — Progress Notes (Signed)
Electrophysiology Office Note Date: 01/18/2017  ID:  Brittany [name], DOB December 12, 1927, MRN [id number]  PCP: Joette CatchingNyland, Leonard, MD Electrophysiologist: Graciela HusbandsKlein  CC: Pacemaker follow-up  Brittany LundborgMarlyn M [name] is a 84 y.o. female seen today for Dr Graciela HusbandsKlein.  She presents today for routine electrophysiology followup.  Since last being seen in our clinic, the patient reports doing reasonably well.  She denies chest pain, palpitations, dyspnea, PND, orthopnea, nausea, vomiting, dizziness, syncope, edema, weight gain, or early satiety.  Device History: STJ dual chamber PPM implanted 2006 for complete heart block; gen change 2017   Past Medical History:  Diagnosis Date  . Atrial flutter (HCC)   . CAD (coronary artery disease)   . Complete heart block (HCC)    a. s/p STJ dual chamber PPM  . Glaucoma   . Hyperlipidemia   . Hypertension   . Type II diabetes mellitus (HCC)    Past Surgical History:  Procedure Laterality Date  . BREAST BIOPSY  1991  . ELECTROPHYSIOLOGIC STUDY N/A 02/12/2015   cardioversion by Dr Graciela HusbandsKlein   . EP IMPLANTABLE DEVICE N/A 04/07/2015   a. STJ dual chamber PPM implanted 2006 for complete heart block b. gen change 2017  . FEMUR IM NAIL  01/18/2012   Procedure: INTRAMEDULLARY (IM) NAIL FEMORAL;  Surgeon: Senaida LangeKevin M Supple, MD;  Location: [facility] OR;  Service: Orthopedics;  Laterality: Left;  . left total knee replacement  1996  . TONSILLECTOMY  1937    Current Outpatient Medications  Medication Sig Dispense Refill  . carvedilol (COREG) 25 MG tablet Take 0.5 tablets (12.5 mg total) by mouth 2 (two) times daily. 30 tablet 8  . Coenzyme Q10 (COQ10 PO) Take 1 capsule by mouth daily.    Marland Kitchen. ELIQUIS 5 MG TABS tablet TAKE 1 TABLET TWICE A DAY 60 tablet 3  . furosemide (LASIX) 40 MG tablet Take 1 tablet (40 mg) by mouth in the AM and take 1/2 tablet (20 mg) in the PM    . glucose blood (ONE TOUCH ULTRA TEST) test strip USE FOR TESTING ONCE DAILY    . irbesartan (AVAPRO) 75 MG tablet Take 0.5  tablets (37.5 mg total) by mouth at bedtime. Please make yearly appt with Dr. Graciela HusbandsKlein before anymore refills. 1st attempt 15 tablet 3  . isosorbide mononitrate (IMDUR) 30 MG 24 hr tablet TAKE 1 TABLET DAILY 30 tablet 7  . MEGARED OMEGA-3 KRILL OIL 500 MG CAPS Take 1 tablet by mouth daily.    . metFORMIN (GLUCOPHAGE-XR) 500 MG 24 hr tablet Take 1,000 mg by mouth 2 (two) times daily.    . Multiple Vitamin (MULTIVITAMIN) tablet Take 1 tablet by mouth daily. CENTRUM SILVER    . Omega-3 Fatty Acids (OMEGA-3 PO) Take 1 capsule by mouth daily.    Letta Pate. ONETOUCH DELICA LANCETS 33G MISC     . potassium chloride 20 MEQ TBCR Take 20 mEq by mouth daily. 30 tablet 0   No current facility-administered medications for this visit.     Allergies:   Ciprofloxacin; Ace inhibitors; Atorvastatin; Cedax [ceftibuten]; Plavix [clopidogrel bisulfate]; and Statins   Social History: Social History   Socioeconomic History  . Marital status: Divorced    Spouse name: Not on file  . Number of children: Not on file  . Years of education: Not on file  . Highest education level: Not on file  Social Needs  . Financial resource strain: Not on file  . Food insecurity - worry: Not on file  . Food insecurity -  inability: Not on file  . Transportation needs - medical: Not on file  . Transportation needs - non-medical: Not on file  Occupational History  . Occupation: Retired  Tobacco Use  . Smoking status: Former Smoker    Types: Cigarettes    Last attempt to quit: 01/17/1951    Years since quitting: 66.0  . Smokeless tobacco: Never Used  Substance and Sexual Activity  . Alcohol use: Yes    Comment: Occasional glass of wine  . Drug use: No  . Sexual activity: Not on file  Other Topics Concern  . Not on file  Social History Narrative  . Not on file    Family History: Family History  Problem Relation Age of Onset  . Cancer Unknown   . Coronary artery disease Unknown   . Stroke Unknown   . Diabetes Unknown   .  Hypertension Unknown      Review of Systems: All other systems reviewed and are otherwise negative except as noted above.   Physical Exam: VS:  BP 138/80   Pulse 86   Resp 16   Ht 5\' 2"  (1.575 m)   Wt 165 lb 3.2 oz (74.9 kg)   SpO2 98%   BMI 30.22 kg/m  , BMI Body mass index is 30.22 kg/m.  GEN- The patient is elderly appearing, alert and oriented x 3 today.   HEENT: normocephalic, atraumatic; sclera clear, conjunctiva pink; hearing intact; oropharynx clear; neck supple  Lungs- Clear to ausculation bilaterally, normal work of breathing.  No wheezes, rales, rhonchi Heart- Regular rate and rhythm (paced) GI- soft, non-tender, non-distended, bowel sounds present  Extremities- no clubbing, cyanosis, or edema MS- no significant deformity or atrophy Skin- warm and dry, no rash or lesion; PPM pocket well healed Psych- euthymic mood, full affect Neuro- strength and sensation are intact  PPM Interrogation- reviewed in detail today,  See PACEART report  EKG:  EKG is not ordered today.  Recent Labs: No results found for requested labs within last 8760 hours.   Wt Readings from Last 3 Encounters:  01/18/17 165 lb 3.2 oz (74.9 kg)  03/30/16 183 lb (83 kg)  07/08/15 175 lb 12.8 oz (79.7 kg)     Other studies Reviewed: Additional studies/ records that were reviewed today include: Dr Odessa Fleming office notes  Assessment and Plan:  1.  Complete heart block Normal PPM function See Pace Art report No changes today  2.  Left sided atrial flutter Continue Eliquis for CHADS2VASC of 5 She is in atrial flutter today but asymptomatic  CBC, BMET followed by PCP  3.  HTN Stable No change required today  Current medicines are reviewed at length with the patient today.   The patient does not have concerns regarding her medicines.  The following changes were made today:  none  Labs/ tests ordered today include: none No orders of the defined types were placed in this  encounter.    Disposition:   Follow up with Arsenio Katz, Dr Graciela Husbands in 1 year     Signed, Gypsy Balsam, NP 01/18/2017 12:17 PM  [facility] [address] [place] [address] [phone] (office) [phone] (fax)

## 2017-01-18 ENCOUNTER — Ambulatory Visit: Payer: Medicare Other | Admitting: Nurse Practitioner

## 2017-01-18 ENCOUNTER — Encounter: Payer: Self-pay | Admitting: Nurse Practitioner

## 2017-01-18 VITALS — BP 138/80 | HR 86 | Resp 16 | Ht 62.0 in | Wt 165.2 lb

## 2017-01-18 DIAGNOSIS — I442 Atrioventricular block, complete: Secondary | ICD-10-CM

## 2017-01-18 DIAGNOSIS — I10 Essential (primary) hypertension: Secondary | ICD-10-CM

## 2017-01-18 DIAGNOSIS — I484 Atypical atrial flutter: Secondary | ICD-10-CM | POA: Diagnosis not present

## 2017-01-18 NOTE — Patient Instructions (Addendum)
Medication Instructions:   Your physician recommends that you continue on your current medications as directed. Please refer to the Current Medication list given to you today.   If you need a refill on your cardiac medications before your next appointment, please call your pharmacy.  Labwork: NONE ORDERED  TODAY    Testing/Procedures: NONE ORDERED  TODAY    Follow-Up:  Your physician wants you to follow-up in: ONE YEAR WITH  K,EIN  You will receive a reminder letter in the mail two months in advance. If you don't receive a letter, please call our office to schedule the follow-up appointment.   Remote monitoring is used to monitor your Pacemaker of ICD from home. This monitoring reduces the number of office visits required to check your device to one time per year. It allows us to keep an eye on the functioning of your device to ensure it is working properly. You are scheduled for a device check from home on . 3-25-19You may send your transmission at any time that day. If you have a wireless device, the transmission will be sent automatically. After your physician reviews your transmission, you will receive a postcard with your next transmission date.     Any Other Special Instructions Will Be Listed Below (If Applicable).

## 2017-01-31 LAB — CUP PACEART INCLINIC DEVICE CHECK
Date Time Interrogation Session: 20190123084853
Implantable Lead Implant Date: 20061229
Implantable Lead Location: 753859
Implantable Lead Model: 5076
MDC IDC LEAD IMPLANT DT: 20061229
MDC IDC LEAD LOCATION: 753860
MDC IDC PG IMPLANT DT: 20170329
MDC IDC PG SERIAL: 7892876
Pulse Gen Model: 2240

## 2017-03-05 ENCOUNTER — Other Ambulatory Visit: Payer: Self-pay | Admitting: Internal Medicine

## 2017-03-10 ENCOUNTER — Other Ambulatory Visit: Payer: Self-pay | Admitting: Internal Medicine

## 2017-04-02 ENCOUNTER — Ambulatory Visit (INDEPENDENT_AMBULATORY_CARE_PROVIDER_SITE_OTHER): Payer: Medicare Other | Admitting: *Deleted

## 2017-04-02 DIAGNOSIS — I442 Atrioventricular block, complete: Secondary | ICD-10-CM | POA: Diagnosis not present

## 2017-04-02 NOTE — Progress Notes (Signed)
Remote pacemaker transmission.   

## 2017-04-03 ENCOUNTER — Encounter: Payer: Self-pay | Admitting: Cardiology

## 2017-04-03 LAB — CUP PACEART REMOTE DEVICE CHECK
Battery Remaining Longevity: 102 mo
Battery Voltage: 2.99 V
Brady Statistic AS VP Percent: 25 %
Brady Statistic RA Percent Paced: 57 %
Date Time Interrogation Session: 20190325060014
Implantable Lead Implant Date: 20061229
Implantable Lead Location: 753859
Implantable Pulse Generator Implant Date: 20170329
Lead Channel Impedance Value: 360 Ohm
Lead Channel Pacing Threshold Amplitude: 0.75 V
Lead Channel Pacing Threshold Pulse Width: 0.5 ms
Lead Channel Setting Pacing Amplitude: 2 V
MDC IDC LEAD IMPLANT DT: 20061229
MDC IDC LEAD LOCATION: 753860
MDC IDC MSMT BATTERY REMAINING PERCENTAGE: 95.5 %
MDC IDC MSMT LEADCHNL RA SENSING INTR AMPL: 0.6 mV
MDC IDC MSMT LEADCHNL RV IMPEDANCE VALUE: 780 Ohm
MDC IDC MSMT LEADCHNL RV PACING THRESHOLD AMPLITUDE: 1.625 V
MDC IDC MSMT LEADCHNL RV PACING THRESHOLD PULSEWIDTH: 0.6 ms
MDC IDC MSMT LEADCHNL RV SENSING INTR AMPL: 12 mV
MDC IDC SET LEADCHNL RV PACING AMPLITUDE: 1.875
MDC IDC SET LEADCHNL RV PACING PULSEWIDTH: 0.6 ms
MDC IDC SET LEADCHNL RV SENSING SENSITIVITY: 6 mV
MDC IDC STAT BRADY AP VP PERCENT: 75 %
MDC IDC STAT BRADY AP VS PERCENT: 1 %
MDC IDC STAT BRADY AS VS PERCENT: 1 %
MDC IDC STAT BRADY RV PERCENT PACED: 99 %
Pulse Gen Model: 2240
Pulse Gen Serial Number: 7892876

## 2017-04-10 ENCOUNTER — Other Ambulatory Visit: Payer: Self-pay | Admitting: Nurse Practitioner

## 2017-05-03 ENCOUNTER — Other Ambulatory Visit: Payer: Self-pay | Admitting: Internal Medicine

## 2017-05-03 NOTE — Telephone Encounter (Signed)
Pt is a 84 yr old female who have an OV on 01/18/17 with Cardiology, her weight at that time was 74.9Kg. Her last noted SCr on 06/06/16 was 0.61in KPN.  Will refill Eliquis 5mg  BID.

## 2017-07-02 ENCOUNTER — Ambulatory Visit (INDEPENDENT_AMBULATORY_CARE_PROVIDER_SITE_OTHER): Payer: Medicare Other | Admitting: *Deleted

## 2017-07-02 DIAGNOSIS — I442 Atrioventricular block, complete: Secondary | ICD-10-CM | POA: Diagnosis not present

## 2017-07-02 NOTE — Progress Notes (Signed)
Remote pacemaker transmission.   

## 2017-07-03 ENCOUNTER — Encounter: Payer: Self-pay | Admitting: Cardiology

## 2017-07-06 LAB — CUP PACEART REMOTE DEVICE CHECK
Battery Remaining Longevity: 101 mo
Battery Remaining Percentage: 95.5 %
Brady Statistic AP VP Percent: 82 %
Brady Statistic AP VS Percent: 1 %
Brady Statistic AS VP Percent: 18 %
Brady Statistic AS VS Percent: 1 %
Brady Statistic RV Percent Paced: 99 %
Implantable Lead Implant Date: 20061229
Implantable Lead Location: 753860
Implantable Lead Model: 5076
Lead Channel Impedance Value: 380 Ohm
Lead Channel Pacing Threshold Amplitude: 1.5 V
Lead Channel Pacing Threshold Pulse Width: 0.6 ms
Lead Channel Sensing Intrinsic Amplitude: 0.7 mV
Lead Channel Sensing Intrinsic Amplitude: 12 mV
Lead Channel Setting Pacing Amplitude: 1.75 V
Lead Channel Setting Pacing Amplitude: 2 V
Lead Channel Setting Pacing Pulse Width: 0.6 ms
Lead Channel Setting Sensing Sensitivity: 6 mV
MDC IDC LEAD IMPLANT DT: 20061229
MDC IDC LEAD LOCATION: 753859
MDC IDC MSMT BATTERY VOLTAGE: 2.99 V
MDC IDC MSMT LEADCHNL RA PACING THRESHOLD AMPLITUDE: 0.75 V
MDC IDC MSMT LEADCHNL RA PACING THRESHOLD PULSEWIDTH: 0.5 ms
MDC IDC MSMT LEADCHNL RV IMPEDANCE VALUE: 830 Ohm
MDC IDC PG IMPLANT DT: 20170329
MDC IDC PG SERIAL: 7892876
MDC IDC SESS DTM: 20190624060014
MDC IDC STAT BRADY RA PERCENT PACED: 73 %

## 2017-07-23 ENCOUNTER — Encounter: Payer: Self-pay | Admitting: Physician Assistant

## 2017-08-09 ENCOUNTER — Encounter: Payer: Self-pay | Admitting: Physician Assistant

## 2017-08-09 ENCOUNTER — Encounter (INDEPENDENT_AMBULATORY_CARE_PROVIDER_SITE_OTHER): Payer: Self-pay

## 2017-08-09 ENCOUNTER — Ambulatory Visit: Payer: Medicare Other | Admitting: Physician Assistant

## 2017-08-09 ENCOUNTER — Other Ambulatory Visit (INDEPENDENT_AMBULATORY_CARE_PROVIDER_SITE_OTHER): Payer: Medicare Other

## 2017-08-09 ENCOUNTER — Encounter

## 2017-08-09 VITALS — BP 164/60 | HR 76 | Ht 59.0 in | Wt 160.4 lb

## 2017-08-09 DIAGNOSIS — Z7901 Long term (current) use of anticoagulants: Secondary | ICD-10-CM

## 2017-08-09 DIAGNOSIS — K648 Other hemorrhoids: Secondary | ICD-10-CM | POA: Diagnosis not present

## 2017-08-09 DIAGNOSIS — K625 Hemorrhage of anus and rectum: Secondary | ICD-10-CM

## 2017-08-09 LAB — CBC WITH DIFFERENTIAL/PLATELET
BASOS PCT: 0.7 % (ref 0.0–3.0)
Basophils Absolute: 0.1 10*3/uL (ref 0.0–0.1)
EOS ABS: 0.2 10*3/uL (ref 0.0–0.7)
Eosinophils Relative: 3 % (ref 0.0–5.0)
HEMATOCRIT: 36.6 % (ref 36.0–46.0)
Hemoglobin: 12.4 g/dL (ref 12.0–15.0)
LYMPHS PCT: 39.5 % (ref 12.0–46.0)
Lymphs Abs: 2.8 10*3/uL (ref 0.7–4.0)
MCHC: 34 g/dL (ref 30.0–36.0)
MCV: 87 fl (ref 78.0–100.0)
Monocytes Absolute: 0.9 10*3/uL (ref 0.1–1.0)
Monocytes Relative: 12.5 % — ABNORMAL HIGH (ref 3.0–12.0)
NEUTROS ABS: 3.1 10*3/uL (ref 1.4–7.7)
Neutrophils Relative %: 44.3 % (ref 43.0–77.0)
PLATELETS: 212 10*3/uL (ref 150.0–400.0)
RBC: 4.2 Mil/uL (ref 3.87–5.11)
RDW: 14.3 % (ref 11.5–15.5)
WBC: 7.1 10*3/uL (ref 4.0–10.5)

## 2017-08-09 MED ORDER — HYDROCORTISONE ACETATE 25 MG RE SUPP: RECTAL | 6 refills | Status: DC

## 2017-08-09 NOTE — Patient Instructions (Signed)
Your provider has requested that you go to the basement level for lab work before leaving today. Press "B" on the elevator. The lab is located at the first door on the left as you exit the elevator.  We sent a prescription to your pharmacy [facility]. If too expensive, you can get Preperation H suppositories.  If bleeding increases you may need an er evaluation/admission because of Eliquis and may need a colonoscopy.   Follow up with Dr. Lavon PaganiniNandigam in one month.  Call our office in the next week to schedule. [phone] choose option 2.  If she doesn't have an opening, make the appointment with Mike GipAmy Esterwood PA for early September.   If you are age 82 or older, your body mass index should be between 23-30. Your Body mass index is 32.39 kg/m. If this is out of the aforementioned range listed, please consider follow up with your Primary Care Provider.

## 2017-08-09 NOTE — Progress Notes (Signed)
Subjective:    Patient ID: Brittany [name], female    DOB: [birth year], 84 y.o.   MRN: [id number]  HPI Hoorain is a pleasant 84 year old white female, new to GI today referred by Baxter Hire has PA-C/[facility] for evaluation of rectal bleeding.  She had been seen here in 2003 by Dr. Arlyce Dice and may have had an EGD.  She says she has never had colonoscopy. Patient also states she does not believe in colonoscopy. He has history of hypertension, congestive heart failure, adult onset diabetes mellitus, she status post pacemaker and had LAD balloon angioplasty remotely.  She is maintained on Eliquis. Patient has recently started having intermittent small-volume rectal bleeding which she says is always red and she has not seen any clots and has not noted any dark blood or dark stools.  She only sees blood with a bowel movement and has noted blood in the commode but not particularly mixed with the stool and not on the tissue.  She has not had any problems with constipation or straining.  She has no complaints of abdominal discomfort.  Appetite is been good, weight has been stable She denies any anal rectal discomfort. She feels that her symptoms have started within the past couple of weeks she has never had any bleeding in the past Royal issues. Exam per primary care yesterday with finding of bright red blood in the patient's depend, external exam was negative. She had labs done yesterday we have requested those.  Last hemoglobin done in May 2019 was 11.9 hematocrit 37.4, MCV of 90 platelets 210.  Review of Systems Pertinent positive and negative review of systems were noted in the above HPI section.  All other review of systems was otherwise negative.  Outpatient Encounter Medications as of 08/09/2017  Medication Sig  . acetaminophen (TYLENOL) 325 MG tablet Take 650 mg by mouth as needed.  . carvedilol (COREG) 25 MG tablet TAKE 1/2 TABLET TWICE A DAY  . Coenzyme Q10 (COQ10 PO) Take 1 capsule by mouth  daily.  Marland Kitchen docusate sodium (COLACE) 100 MG capsule Take 100 mg by mouth daily.  Marland Kitchen ELIQUIS 5 MG TABS tablet TAKE 1 TABLET TWICE A DAY  . furosemide (LASIX) 40 MG tablet Take 2 tablet (80 mg) by mouth in the AM and take 1 tablet (40 mg) in the PM  . glucose blood (ONE TOUCH ULTRA TEST) test strip USE FOR TESTING ONCE DAILY  . irbesartan (AVAPRO) 75 MG tablet Take 0.5 tablets (37.5 mg total) by mouth daily.  . isosorbide mononitrate (IMDUR) 30 MG 24 hr tablet TAKE 1 TABLET DAILY  . MEGARED OMEGA-3 KRILL OIL 500 MG CAPS Take 1 tablet by mouth daily.  . metFORMIN (GLUCOPHAGE-XR) 500 MG 24 hr tablet Take 1,000 mg by mouth 2 (two) times daily.  . Multiple Vitamin (MULTIVITAMIN) tablet Take 1 tablet by mouth daily. CENTRUM SILVER  . Omega-3 Fatty Acids (OMEGA-3 PO) Take 1 capsule by mouth daily.  Letta Pate DELICA LANCETS 33G MISC   . potassium chloride 20 MEQ TBCR Take 20 mEq by mouth daily.  . hydrocortisone (ANUSOL-HC) 25 MG suppository Insert 1 suppository in the rectum before bedtime for 10 days for internal hemorrhoids. Repeat as needed.   No facility-administered encounter medications on file as of 08/09/2017.    Allergies  Allergen Reactions  . Ciprofloxacin Other (See Comments)    Severe chest pains  . Ace Inhibitors Other (See Comments)    REACTION: cough and sore throat  . Cedax [Ceftibuten]  Other (See Comments)    Possibly nausea/vomiting, cyanosis, severe headaches  . Lipitor [Atorvastatin] Other (See Comments)    REACTION: Myalgia  . Plavix [Clopidogrel Bisulfate] Other (See Comments)    Possibly nausea/vomiting, cyanosis, severe headaches    . Statins Other (See Comments)    Myalgias, cyanosis   Patient Active Problem List   Diagnosis Date Noted  . CHF (congestive heart failure) (HCC) 01/02/2015  . DM2 (diabetes mellitus, type 2) (HCC) 01/02/2015  . Belching 10/22/2012  . SOB (shortness of breath) 10/22/2012  . Lower extremity numbness 06/20/2012  . Closed left hip  fracture (HCC) 01/18/2012  . HYPERSOMNIA UNSPECIFIED 06/23/2008  . EDEMA 06/23/2008  . GLAUCOMA 05/27/2008  . LAD-balloon angioplasty/cutting balloon-2007 05/27/2008  . AV BLOCK, COMPLETE 05/27/2008  . PACEMAKER, PERMANENT 05/27/2008  . HYPERLIPIDEMIA 11/05/2006  . HYPERTENSION 11/05/2006   Social History   Socioeconomic History  . Marital status: Divorced    Spouse name: Not on file  . Number of children: 5  . Years of education: Not on file  . Highest education level: Not on file  Occupational History  . Occupation: Retired  Engineer, production  . Financial resource strain: Not on file  . Food insecurity:    Worry: Not on file    Inability: Not on file  . Transportation needs:    Medical: Not on file    Non-medical: Not on file  Tobacco Use  . Smoking status: Former Smoker    Types: Cigarettes    Last attempt to quit: 01/17/1951    Years since quitting: 66.6  . Smokeless tobacco: Never Used  Substance and Sexual Activity  . Alcohol use: Yes    Comment: Occasional glass of wine  . Drug use: No  . Sexual activity: Not on file  Lifestyle  . Physical activity:    Days per week: Not on file    Minutes per session: Not on file  . Stress: Not on file  Relationships  . Social connections:    Talks on phone: Not on file    Gets together: Not on file    Attends religious service: Not on file    Active member of club or organization: Not on file    Attends meetings of clubs or organizations: Not on file    Relationship status: Not on file  . Intimate partner violence:    Fear of current or ex partner: Not on file    Emotionally abused: Not on file    Physically abused: Not on file    Forced sexual activity: Not on file  Other Topics Concern  . Not on file  Social History Narrative  . Not on file    Ms. Mackins's family history includes Diabetes in her mother; Heart attack in her father; Hypertension in her father; Kidney disease in her daughter; Stroke in her father.       Objective:    Vitals:   08/09/17 0943  BP: (!) 164/60  Pulse: 76    Physical Exam; well-developed pleasant elderly white female in no acute distress, she ambulates with a walker and is wearing depends.  Blood pressure 164/60 pulse 76, height 4 foot 11, weight 160, BMI 32.3.  HEENT; nontraumatic normocephalic EOMI PERRLA sclera anicteric buccal mucosa moist, neck supple, Cardiovascular ;regular rate and rhythm with S1-S2.  Pulmonary; clear bilaterally, Abdomen; soft nontender nondistended bowel sounds are active no palpable mass or hepatosplenomegaly, Rectal ;exam she has small external hemorrhoid tags, she is nontender to exam stool  is brown and trace heme positive.  On anoscopy is a friable internal hemorrhoid no mass felt.  Extremities ;no clubbing cyanosis or edema skin warm and dry, Neuro psych alert and oriented, grossly nonfocal mood and affect appropriate       Assessment & Plan:   #361 84 year old white female, chronically anticoagulated on Eliquis referred for small volume bright red blood per rectum or mentally over the past 2 weeks. Patient does have a friable internal hemorrhoid on anoscopy and I suspect her current bleeding is secondary to an internal hemorrhoid, exacerbated by chronic anticoagulation.  Cannot rule out other rectal or colonic lesion./cancer  #2 adult onset diabetes mellitus #3.  Congestive heart failure #4.  Coronary artery disease status post remote angioplasty Exline #5 status post pacemaker  Plan; Discussed the situation  In detail with the patient and told her that we could not for certain what her bleeding is coming from without doing a colonoscopy.  She is not interested in a colonoscopy at this time.  She does not feel that she is having significant bleeding.  We will repeat CBC today, and we will obtain copies of yesterday's hemoglobin and iron studies. Start Anusol HC suppositories nightly x1 week then repeat for recurrent symptoms as  needed. Advised patient should she develop more significant bleeding, develop bloody stools and/or clots that she would need to proceed to the emergency room for evaluation. Will plan office follow-up in 1 month.  Patient will be established with Dr. Lavon PaganiniNandigam  Addendum, did receive copies of labs from 10/23/2017 hemoglobin 12.2 hematocrit of 36.1 MCV of 85, iron 129 iron saturation 58.  These are reassuring.  Tiquan Bouch S Librada Castronovo PA-C 08/09/2017   Cc: Domenick BookbinderHess, Kristin M, PA-C

## 2017-08-15 NOTE — Progress Notes (Signed)
Reviewed and agree with documentation and assessment and plan. K. Veena Ciarra Braddy , MD   

## 2017-08-21 IMAGING — NM NM MISC PROCEDURE
6 series · 36 of 36 positions shown · non-contrast
Comparison: none

[Series 1: wbr_r-proj_st rest · 6.51mm/px · 6 of 64 frames shown]
[frame 6/64]
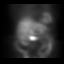
[frame 16/64]
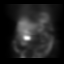
[frame 27/64]
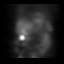
[frame 38/64]
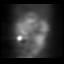
[frame 48/64]
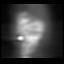
[frame 59/64]
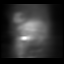

[Series 1: rest · 6.51mm/px · 6 of 64 frames shown]
[frame 6/64]
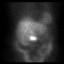
[frame 16/64]
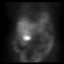
[frame 27/64]
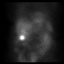
[frame 38/64]
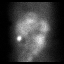
[frame 48/64]
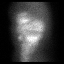
[frame 59/64]
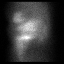

[Series 2: stress · 6.51mm/px · 6 of 512 frames shown (1 of 2)]
[frame 43/512]
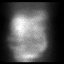
[frame 128/512]
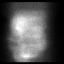
[frame 214/512]
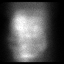
[frame 299/512]
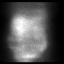
[frame 384/512]
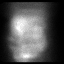
[frame 470/512]
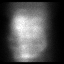

[Series 2: wbr_s-proj_st stress · 6.51mm/px · 6 of 64 frames shown (1 of 2)]
[frame 6/64]
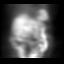
[frame 16/64]
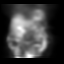
[frame 27/64]
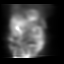
[frame 38/64]
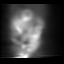
[frame 48/64]
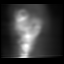
[frame 59/64]
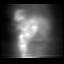

[Series 2: stress · 6.51mm/px · 6 of 64 frames shown (2 of 2)]
[frame 6/64]
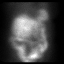
[frame 16/64]
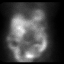
[frame 27/64]
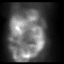
[frame 38/64]
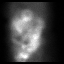
[frame 48/64]
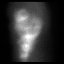
[frame 59/64]
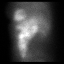

[Series 2: wbr_s-proj_st stress · 6.51mm/px · 6 of 512 frames shown (2 of 2)]
[frame 43/512]
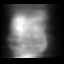
[frame 128/512]
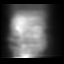
[frame 214/512]
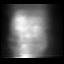
[frame 299/512]
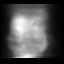
[frame 384/512]
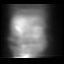
[frame 470/512]
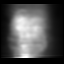

[36 of 36 positions shown; findings below may reference images not displayed]

Canned report from images found in remote index.

Refer to host system for actual result text.

## 2017-09-04 ENCOUNTER — Other Ambulatory Visit: Payer: Self-pay | Admitting: Internal Medicine

## 2017-09-05 NOTE — Telephone Encounter (Signed)
Pt is a 84 yr old female who last saw NP in office on 01/18/17 for Dr Graciela HusbandsKlein. Her last SCr was 0.65 on 06/05/17. Last noted weight was 72.7Kg on 08/09/17. Will refill Eliquis 5mg  BID.

## 2017-10-01 ENCOUNTER — Ambulatory Visit (INDEPENDENT_AMBULATORY_CARE_PROVIDER_SITE_OTHER): Payer: Medicare Other | Admitting: *Deleted

## 2017-10-01 DIAGNOSIS — I442 Atrioventricular block, complete: Secondary | ICD-10-CM

## 2017-10-01 NOTE — Progress Notes (Signed)
Remote pacemaker transmission.   

## 2017-10-02 ENCOUNTER — Encounter: Payer: Self-pay | Admitting: Cardiology

## 2017-10-21 LAB — CUP PACEART REMOTE DEVICE CHECK
Battery Remaining Longevity: 99 mo
Battery Remaining Percentage: 95.5 %
Battery Voltage: 2.99 V
Brady Statistic AP VP Percent: 81 %
Brady Statistic AP VS Percent: 1 %
Brady Statistic AS VS Percent: 1 %
Brady Statistic RV Percent Paced: 99 %
Date Time Interrogation Session: 20190923060014
Implantable Lead Implant Date: 20061229
Implantable Lead Location: 753859
Implantable Lead Model: 5076
Implantable Pulse Generator Implant Date: 20170329
Lead Channel Impedance Value: 390 Ohm
Lead Channel Pacing Threshold Amplitude: 0.75 V
Lead Channel Pacing Threshold Pulse Width: 0.5 ms
Lead Channel Sensing Intrinsic Amplitude: 0.6 mV
Lead Channel Setting Sensing Sensitivity: 6 mV
MDC IDC LEAD IMPLANT DT: 20061229
MDC IDC LEAD LOCATION: 753860
MDC IDC MSMT LEADCHNL RV IMPEDANCE VALUE: 780 Ohm
MDC IDC MSMT LEADCHNL RV PACING THRESHOLD AMPLITUDE: 1.625 V
MDC IDC MSMT LEADCHNL RV PACING THRESHOLD PULSEWIDTH: 0.6 ms
MDC IDC MSMT LEADCHNL RV SENSING INTR AMPL: 12 mV
MDC IDC PG SERIAL: 7892876
MDC IDC SET LEADCHNL RA PACING AMPLITUDE: 2 V
MDC IDC SET LEADCHNL RV PACING AMPLITUDE: 1.875
MDC IDC SET LEADCHNL RV PACING PULSEWIDTH: 0.6 ms
MDC IDC STAT BRADY AS VP PERCENT: 19 %
MDC IDC STAT BRADY RA PERCENT PACED: 75 %

## 2017-12-31 ENCOUNTER — Ambulatory Visit (INDEPENDENT_AMBULATORY_CARE_PROVIDER_SITE_OTHER): Payer: Medicare Other

## 2017-12-31 DIAGNOSIS — I442 Atrioventricular block, complete: Secondary | ICD-10-CM

## 2017-12-31 NOTE — Progress Notes (Signed)
Remote pacemaker transmission.   

## 2018-01-02 LAB — CUP PACEART REMOTE DEVICE CHECK
Battery Remaining Longevity: 98 mo
Battery Remaining Percentage: 95.5 %
Battery Voltage: 2.98 V
Brady Statistic RA Percent Paced: 73 %
Date Time Interrogation Session: 20191223070014
Implantable Lead Implant Date: 20061229
Implantable Lead Location: 753859
Implantable Lead Model: 5076
Implantable Pulse Generator Implant Date: 20170329
Lead Channel Impedance Value: 380 Ohm
Lead Channel Pacing Threshold Amplitude: 0.75 V
Lead Channel Pacing Threshold Pulse Width: 0.5 ms
Lead Channel Setting Sensing Sensitivity: 6 mV
MDC IDC LEAD IMPLANT DT: 20061229
MDC IDC LEAD LOCATION: 753860
MDC IDC MSMT LEADCHNL RA SENSING INTR AMPL: 0.7 mV
MDC IDC MSMT LEADCHNL RV IMPEDANCE VALUE: 760 Ohm
MDC IDC MSMT LEADCHNL RV PACING THRESHOLD AMPLITUDE: 1.625 V
MDC IDC MSMT LEADCHNL RV PACING THRESHOLD PULSEWIDTH: 0.6 ms
MDC IDC MSMT LEADCHNL RV SENSING INTR AMPL: 12 mV
MDC IDC PG SERIAL: 7892876
MDC IDC SET LEADCHNL RA PACING AMPLITUDE: 2 V
MDC IDC SET LEADCHNL RV PACING AMPLITUDE: 1.875
MDC IDC SET LEADCHNL RV PACING PULSEWIDTH: 0.6 ms
MDC IDC STAT BRADY AP VP PERCENT: 77 %
MDC IDC STAT BRADY AP VS PERCENT: 1 %
MDC IDC STAT BRADY AS VP PERCENT: 22 %
MDC IDC STAT BRADY AS VS PERCENT: 1 %
MDC IDC STAT BRADY RV PERCENT PACED: 99 %
Pulse Gen Model: 2240

## 2018-01-18 ENCOUNTER — Ambulatory Visit: Payer: Medicare Other | Admitting: Internal Medicine

## 2018-01-18 VITALS — BP 132/80 | HR 69

## 2018-01-18 DIAGNOSIS — I442 Atrioventricular block, complete: Secondary | ICD-10-CM | POA: Diagnosis not present

## 2018-01-18 DIAGNOSIS — Z950 Presence of cardiac pacemaker: Secondary | ICD-10-CM | POA: Diagnosis not present

## 2018-01-18 DIAGNOSIS — I484 Atypical atrial flutter: Secondary | ICD-10-CM

## 2018-01-18 NOTE — Patient Instructions (Signed)
Medication Instructions:  Your physician recommends that you continue on your current medications as directed. Please refer to the Current Medication list given to you today.  Labwork: None ordered.  Testing/Procedures: None ordered.  Follow-Up: Your physician recommends that you schedule a follow-up appointment in:   12 months with Dr Graciela Husbands  Remote monitoring is used to monitor your Pacemaker from home. This monitoring reduces the number of office visits required to check your device to one time per year. It allows [place] to keep an eye on the functioning of your device to ensure it is working properly. You are scheduled for a device check from home on 04/01/2018. You may send your transmission at any time that day. If you have a wireless device, the transmission will be sent automatically. After your physician reviews your transmission, you will receive a postcard with your next transmission date.    Any Other Special Instructions Will Be Listed Below (If Applicable).     If you need a refill on your cardiac medications before your next appointment, please call your pharmacy.

## 2018-01-18 NOTE — Progress Notes (Signed)
Patient Care Team: Joette Catching, MD as PCP - General (Family Medicine) Duke Salvia, MD (Cardiology)   HPI  Brittany [name] is a 85 y.o. female is seen in followup for complete heart block; she is status post pacemaker implantation and  underwent generator replacement 2017    She has a history of prior LAD POBA with normal left ventricular function.    She has a history of atrial flutter flutter that appeared to be typical. Entrainment mapping, however, demonstrated a left atrial flutter and she was there  cardioverted. She is on anticoagulation w apixoban  No bleeding   The patient denies chest pain, shortness of breath, nocturnal dyspnea, orthopnea or peripheral edema.  There have been no palpitations, lightheadedness or syncope however she putters around her apartment on a walker. Date Cr K Hgb  11/19 0.71 3.9 11.4           Echocardiogram 12/16 demonstrated normal LV function and moderate-severe LAE (45/22.2/45)  Past Medical History:  Diagnosis Date  . Atrial flutter (HCC)   . CAD (coronary artery disease)   . CHF (congestive heart failure) (HCC)   . Complete heart block (HCC)    a. s/p STJ dual chamber PPM  . Depression   . Glaucoma   . Hyperlipidemia   . Hypertension   . Type II diabetes mellitus (HCC)     Past Surgical History:  Procedure Laterality Date  . BREAST BIOPSY  1991  . ELECTROPHYSIOLOGIC STUDY N/A 02/12/2015   cardioversion by Dr Graciela Husbands   . EP IMPLANTABLE DEVICE N/A 04/07/2015   a. STJ dual chamber PPM implanted 2006 for complete heart block b. gen change 2017  . FEMUR IM NAIL  01/18/2012   Procedure: INTRAMEDULLARY (IM) NAIL FEMORAL;  Surgeon: Senaida Lange, MD;  Location: [facility] OR;  Service: Orthopedics;  Laterality: Left;  . left total knee replacement  1996  . TONSILLECTOMY  1937    Current Outpatient Medications  Medication Sig Dispense Refill  . acetaminophen (TYLENOL) 325 MG tablet Take 650 mg by mouth as needed.    . carvedilol  (COREG) 25 MG tablet TAKE 1/2 TABLET TWICE A DAY 30 tablet 10  . Coenzyme Q10 (COQ10 PO) Take 1 capsule by mouth daily.    Marland Kitchen docusate sodium (COLACE) 100 MG capsule Take 100 mg by mouth daily.    Marland Kitchen ELIQUIS 5 MG TABS tablet TAKE 1 TABLET TWICE A DAY 60 tablet 6  . furosemide (LASIX) 40 MG tablet Take 2 tablet (80 mg) by mouth in the AM and take 1 tablet (40 mg) in the PM    . glucose blood (ONE TOUCH ULTRA TEST) test strip USE FOR TESTING ONCE DAILY    . hydrocortisone (ANUSOL-HC) 25 MG suppository Insert 1 suppository in the rectum before bedtime for 10 days for internal hemorrhoids. Repeat as needed. 10 suppository 6  . irbesartan (AVAPRO) 75 MG tablet Take 0.5 tablets (37.5 mg total) by mouth daily. 45 tablet 2  . isosorbide mononitrate (IMDUR) 30 MG 24 hr tablet TAKE 1 TABLET DAILY 90 tablet 3  . MEGARED OMEGA-3 KRILL OIL 500 MG CAPS Take 1 tablet by mouth daily.    . metFORMIN (GLUCOPHAGE-XR) 500 MG 24 hr tablet Take 1,000 mg by mouth 2 (two) times daily.    . Multiple Vitamin (MULTIVITAMIN) tablet Take 1 tablet by mouth daily. CENTRUM SILVER    . Omega-3 Fatty Acids (OMEGA-3 PO) Take 1 capsule by mouth daily.    Marland Kitchen  ONETOUCH DELICA LANCETS 33G MISC     . potassium chloride 20 MEQ TBCR Take 20 mEq by mouth daily. 30 tablet 0   No current facility-administered medications for this visit.     Allergies  Allergen Reactions  . Ciprofloxacin Other (See Comments)    Severe chest pains  . Ace Inhibitors Other (See Comments)    REACTION: cough and sore throat  . Cedax [Ceftibuten] Other (See Comments)    Possibly nausea/vomiting, cyanosis, severe headaches  . Lipitor [Atorvastatin] Other (See Comments)    REACTION: Myalgia  . Plavix [Clopidogrel Bisulfate] Other (See Comments)    Possibly nausea/vomiting, cyanosis, severe headaches    . Statins Other (See Comments)    Myalgias, cyanosis    Review of Systems negative except from HPI and PMH  Physical Exam BP 132/80 (BP Location: Left  Arm, Patient Position: Supine)   Pulse 69  Well developed and nourished in no acute distress HENT normal Neck supple with JVP 7-8 cm Clear Regular rate and rhythm, no murmurs or gallops Abd-soft with active BS No Clubbing cyanosis trace edema Skin-warm and dry A & Oriented  Grossly normal sensory and motor function Device pocket well healed; without hematoma or erythema.  There is no tethering   ECG  P-synchronous/ AV  pacing Her other G no I thought it is always a and the other day  Assessment and  Plan  Hypertension much improved  Complete heart block  Pacemaker-St. Jude The patient's device was interrogated.  The information was reviewed. No changes were made in the programming.    Coronary disease with prior LAD POBA Without symptoms of ischemia  Atrial flutter  No intercurrent afib  On Anticoagulation;  No bleeding issues Hgb stable  Without symptoms of ischemia  We spent more than 50% of our >25 min visit in face to face counseling regarding the above

## 2018-01-22 LAB — CUP PACEART INCLINIC DEVICE CHECK
Date Time Interrogation Session: 20200114075736
Implantable Lead Implant Date: 20061229
Implantable Lead Implant Date: 20061229
Implantable Lead Location: 753859
Implantable Lead Location: 753860
Implantable Lead Model: 5076
Implantable Pulse Generator Implant Date: 20170329
Pulse Gen Model: 2240
Pulse Gen Serial Number: 7892876

## 2018-04-01 ENCOUNTER — Other Ambulatory Visit: Payer: Self-pay

## 2018-04-01 ENCOUNTER — Ambulatory Visit (INDEPENDENT_AMBULATORY_CARE_PROVIDER_SITE_OTHER): Payer: Medicare Other | Admitting: *Deleted

## 2018-04-01 DIAGNOSIS — I442 Atrioventricular block, complete: Secondary | ICD-10-CM

## 2018-04-02 LAB — CUP PACEART REMOTE DEVICE CHECK
Battery Remaining Longevity: 97 mo
Battery Remaining Percentage: 95.5 %
Battery Voltage: 2.98 V
Brady Statistic AP VP Percent: 88 %
Brady Statistic AP VS Percent: 1 %
Brady Statistic AS VP Percent: 12 %
Brady Statistic AS VS Percent: 1 %
Brady Statistic RA Percent Paced: 88 %
Brady Statistic RV Percent Paced: 99 %
Date Time Interrogation Session: 20200323060013
Implantable Lead Implant Date: 20061229
Implantable Lead Implant Date: 20061229
Implantable Lead Location: 753859
Implantable Lead Location: 753860
Implantable Lead Model: 5076
Implantable Pulse Generator Implant Date: 20170329
Lead Channel Impedance Value: 380 Ohm
Lead Channel Impedance Value: 730 Ohm
Lead Channel Pacing Threshold Amplitude: 0.75 V
Lead Channel Pacing Threshold Amplitude: 1.875 V
Lead Channel Pacing Threshold Pulse Width: 0.5 ms
Lead Channel Pacing Threshold Pulse Width: 0.6 ms
Lead Channel Sensing Intrinsic Amplitude: 0.7 mV
Lead Channel Sensing Intrinsic Amplitude: 12 mV
Lead Channel Setting Pacing Amplitude: 2 V
Lead Channel Setting Pacing Amplitude: 2.125
Lead Channel Setting Pacing Pulse Width: 0.6 ms
Lead Channel Setting Sensing Sensitivity: 6 mV
Pulse Gen Model: 2240
Pulse Gen Serial Number: 7892876

## 2018-04-10 NOTE — Progress Notes (Signed)
Remote pacemaker transmission.   

## 2018-07-01 ENCOUNTER — Ambulatory Visit (INDEPENDENT_AMBULATORY_CARE_PROVIDER_SITE_OTHER): Payer: Medicare Other | Admitting: *Deleted

## 2018-07-01 DIAGNOSIS — I442 Atrioventricular block, complete: Secondary | ICD-10-CM

## 2018-07-01 DIAGNOSIS — I509 Heart failure, unspecified: Secondary | ICD-10-CM

## 2018-07-01 LAB — CUP PACEART REMOTE DEVICE CHECK
Battery Remaining Longevity: 98 mo
Battery Remaining Percentage: 95.5 %
Battery Voltage: 2.98 V
Brady Statistic AP VP Percent: 89 %
Brady Statistic AP VS Percent: 1 %
Brady Statistic AS VP Percent: 11 %
Brady Statistic AS VS Percent: 1 %
Brady Statistic RA Percent Paced: 88 %
Brady Statistic RV Percent Paced: 99 %
Date Time Interrogation Session: 20200622061551
Implantable Lead Implant Date: 20061229
Implantable Lead Implant Date: 20061229
Implantable Lead Location: 753859
Implantable Lead Location: 753860
Implantable Lead Model: 5076
Implantable Pulse Generator Implant Date: 20170329
Lead Channel Impedance Value: 380 Ohm
Lead Channel Impedance Value: 760 Ohm
Lead Channel Pacing Threshold Amplitude: 0.75 V
Lead Channel Pacing Threshold Amplitude: 1.75 V
Lead Channel Pacing Threshold Pulse Width: 0.5 ms
Lead Channel Pacing Threshold Pulse Width: 0.6 ms
Lead Channel Sensing Intrinsic Amplitude: 0.4 mV
Lead Channel Sensing Intrinsic Amplitude: 12 mV
Lead Channel Setting Pacing Amplitude: 2 V
Lead Channel Setting Pacing Amplitude: 2 V
Lead Channel Setting Pacing Pulse Width: 0.6 ms
Lead Channel Setting Sensing Sensitivity: 6 mV
Pulse Gen Model: 2240
Pulse Gen Serial Number: 7892876

## 2018-07-10 NOTE — Progress Notes (Signed)
Remote pacemaker transmission.   

## 2018-09-30 LAB — CUP PACEART REMOTE DEVICE CHECK
Battery Remaining Longevity: 97 mo
Battery Remaining Percentage: 95.5 %
Battery Voltage: 2.98 V
Brady Statistic AP VP Percent: 89 %
Brady Statistic AP VS Percent: 1 %
Brady Statistic AS VP Percent: 11 %
Brady Statistic AS VS Percent: 1 %
Brady Statistic RA Percent Paced: 88 %
Brady Statistic RV Percent Paced: 99 %
Date Time Interrogation Session: 20200921060012
Implantable Lead Implant Date: 20061229
Implantable Lead Implant Date: 20061229
Implantable Lead Location: 753859
Implantable Lead Location: 753860
Implantable Lead Model: 5076
Implantable Pulse Generator Implant Date: 20170329
Lead Channel Impedance Value: 360 Ohm
Lead Channel Impedance Value: 740 Ohm
Lead Channel Pacing Threshold Amplitude: 0.75 V
Lead Channel Pacing Threshold Amplitude: 1.625 V
Lead Channel Pacing Threshold Pulse Width: 0.5 ms
Lead Channel Pacing Threshold Pulse Width: 0.6 ms
Lead Channel Sensing Intrinsic Amplitude: 0.7 mV
Lead Channel Sensing Intrinsic Amplitude: 12 mV
Lead Channel Setting Pacing Amplitude: 1.875
Lead Channel Setting Pacing Amplitude: 2 V
Lead Channel Setting Pacing Pulse Width: 0.6 ms
Lead Channel Setting Sensing Sensitivity: 6 mV
Pulse Gen Model: 2240
Pulse Gen Serial Number: 7892876

## 2018-10-01 ENCOUNTER — Ambulatory Visit (INDEPENDENT_AMBULATORY_CARE_PROVIDER_SITE_OTHER): Payer: Medicare Other | Admitting: *Deleted

## 2018-10-01 DIAGNOSIS — I509 Heart failure, unspecified: Secondary | ICD-10-CM

## 2018-10-01 DIAGNOSIS — I442 Atrioventricular block, complete: Secondary | ICD-10-CM

## 2018-10-11 NOTE — Progress Notes (Signed)
Remote pacemaker transmission.   

## 2018-12-31 ENCOUNTER — Ambulatory Visit (INDEPENDENT_AMBULATORY_CARE_PROVIDER_SITE_OTHER): Payer: Medicare Other | Admitting: *Deleted

## 2018-12-31 ENCOUNTER — Telehealth: Payer: Self-pay | Admitting: *Deleted

## 2018-12-31 DIAGNOSIS — I442 Atrioventricular block, complete: Secondary | ICD-10-CM | POA: Diagnosis not present

## 2018-12-31 LAB — CUP PACEART REMOTE DEVICE CHECK
Battery Remaining Longevity: 96 mo
Battery Remaining Percentage: 95.5 %
Battery Voltage: 2.98 V
Brady Statistic AP VP Percent: 86 %
Brady Statistic AP VS Percent: 1 %
Brady Statistic AS VP Percent: 14 %
Brady Statistic AS VS Percent: 1 %
Brady Statistic RA Percent Paced: 85 %
Brady Statistic RV Percent Paced: 99 %
Date Time Interrogation Session: 20201222023539
Implantable Lead Implant Date: 20061229
Implantable Lead Implant Date: 20061229
Implantable Lead Location: 753859
Implantable Lead Location: 753860
Implantable Lead Model: 5076
Implantable Pulse Generator Implant Date: 20170329
Lead Channel Impedance Value: 380 Ohm
Lead Channel Impedance Value: 730 Ohm
Lead Channel Pacing Threshold Amplitude: 0.75 V
Lead Channel Pacing Threshold Amplitude: 1.875 V
Lead Channel Pacing Threshold Pulse Width: 0.5 ms
Lead Channel Pacing Threshold Pulse Width: 0.6 ms
Lead Channel Sensing Intrinsic Amplitude: 0.6 mV
Lead Channel Sensing Intrinsic Amplitude: 12 mV
Lead Channel Setting Pacing Amplitude: 2 V
Lead Channel Setting Pacing Amplitude: 2.125
Lead Channel Setting Pacing Pulse Width: 0.6 ms
Lead Channel Setting Sensing Sensitivity: 6 mV
Pulse Gen Model: 2240
Pulse Gen Serial Number: 7892876

## 2018-12-31 NOTE — Telephone Encounter (Signed)
LMOM requesting call back to [facility]. Direct number given.  Presenting rhythm as of scheduled transmission on 12/31/18 at 02:35 shows ?AT/VP rhythm at MTR with ?PMT intervention. Also noted recent increase in day/night HR per DirectTrend, now back at baseline. Will discuss any symptoms with patient.

## 2019-01-07 NOTE — Telephone Encounter (Signed)
Left message  Chanetta Marshall, NP 01/07/2019 9:46 AM

## 2019-01-30 ENCOUNTER — Ambulatory Visit: Payer: Medicare Other

## 2019-03-17 ENCOUNTER — Telehealth: Payer: Self-pay | Admitting: Internal Medicine

## 2019-03-17 NOTE — Telephone Encounter (Signed)
New Message:     Pt have an appt with Dr Graciela Husbands tomorrow. Daughter says she will need to come in with pt, she havi problems remembering.nre

## 2019-03-18 ENCOUNTER — Ambulatory Visit: Payer: Medicare Other | Admitting: Internal Medicine

## 2019-03-18 ENCOUNTER — Other Ambulatory Visit: Payer: Self-pay

## 2019-03-18 ENCOUNTER — Encounter: Payer: Self-pay | Admitting: Internal Medicine

## 2019-03-18 VITALS — BP 160/72 | HR 70 | Ht 59.0 in | Wt 153.0 lb

## 2019-03-18 DIAGNOSIS — Z950 Presence of cardiac pacemaker: Secondary | ICD-10-CM | POA: Diagnosis not present

## 2019-03-18 DIAGNOSIS — I442 Atrioventricular block, complete: Secondary | ICD-10-CM

## 2019-03-18 MED ORDER — FUROSEMIDE 40 MG PO TABS: 60.0000 mg | ORAL_TABLET | Freq: Every day | ORAL | 3 refills | Status: DC

## 2019-03-18 NOTE — Telephone Encounter (Signed)
Spoke with pt's daughter [name] and advised OK for her to come to 03/18/2019 appointment with pt to to pt's memory issues. This is noted in pt's appointment notes.

## 2019-03-18 NOTE — Progress Notes (Signed)
Patient Care Team: Tracey Harries, MD as PCP - General (Family Medicine) Duke Salvia, MD (Cardiology)   HPI  Brittany [name] is a 86 y.o. female is seen in followup for complete heart block; she is status post pacemaker implantation and  underwent generator replacement 2017    She has a history of prior LAD POBA with normal left ventricular function.    She has a history of atrial flutter flutter that appeared to be typical. Entrainment mapping, however, demonstrated a left atrial flutter and she was there  cardioverted. She is on anticoagulation w apixoban  No bleeding    The patient denies chest pain, shortness of breath, nocturnal dyspnea, orthopnea   There have been no palpitations, lightheadedness or syncope.  Activity however is markedly limited within the house. Some edema doing better now that she is keeping her feet up.  They had actually had cut her crocs as her feet were so swollen.  Date Cr K Hgb  11/19 0.71 3.9 11.4   12/20 (CE)  0.69 4.2 12.5      Echocardiogram 12/16 demonstrated normal LV function and moderate-severe LAE (45/22.2/45)  Past Medical History:  Diagnosis Date  . Atrial flutter (HCC)   . CAD (coronary artery disease)   . CHF (congestive heart failure) (HCC)   . Complete heart block (HCC)    a. s/p STJ dual chamber PPM  . Depression   . Glaucoma   . Hyperlipidemia   . Hypertension   . Type II diabetes mellitus (HCC)     Past Surgical History:  Procedure Laterality Date  . BREAST BIOPSY  1991  . ELECTROPHYSIOLOGIC STUDY N/A 02/12/2015   cardioversion by Dr Graciela Husbands   . EP IMPLANTABLE DEVICE N/A 04/07/2015   a. STJ dual chamber PPM implanted 2006 for complete heart block b. gen change 2017  . FEMUR IM NAIL  01/18/2012   Procedure: INTRAMEDULLARY (IM) NAIL FEMORAL;  Surgeon: Senaida Lange, MD;  Location: [facility] OR;  Service: Orthopedics;  Laterality: Left;  . left total knee replacement  1996  . TONSILLECTOMY  1937    Current Outpatient  Medications  Medication Sig Dispense Refill  . acetaminophen (TYLENOL) 325 MG tablet Take 650 mg by mouth as needed.    . carvedilol (COREG) 25 MG tablet TAKE 1/2 TABLET TWICE A DAY 30 tablet 10  . docusate sodium (COLACE) 100 MG capsule Take 100 mg by mouth daily.    Marland Kitchen ELIQUIS 5 MG TABS tablet TAKE 1 TABLET TWICE A DAY 60 tablet 6  . glucose blood (ONE TOUCH ULTRA TEST) test strip USE FOR TESTING ONCE DAILY    . irbesartan (AVAPRO) 75 MG tablet Take 0.5 tablets (37.5 mg total) by mouth daily. 45 tablet 2  . isosorbide mononitrate (IMDUR) 30 MG 24 hr tablet Take 1 tablet by mouth daily.    Marland Kitchen MEGARED OMEGA-3 KRILL OIL 500 MG CAPS Take 1 tablet by mouth daily.    . metFORMIN (GLUCOPHAGE-XR) 500 MG 24 hr tablet Take 1,000 mg by mouth 2 (two) times daily.    . Multiple Vitamin (MULTIVITAMIN) tablet Take 1 tablet by mouth daily. CENTRUM SILVER    . ONETOUCH DELICA LANCETS 33G MISC     . potassium chloride 20 MEQ TBCR Take 20 mEq by mouth daily. 30 tablet 0  . hydrocortisone (ANUSOL-HC) 25 MG suppository Insert 1 suppository in the rectum before bedtime for 10 days for internal hemorrhoids. Repeat as needed. (Patient not taking: Reported  on 03/18/2019) 10 suppository 6   No current facility-administered medications for this visit.    Allergies  Allergen Reactions  . Ciprofloxacin Other (See Comments)    Severe chest pains  . Ace Inhibitors Other (See Comments)    REACTION: cough and sore throat  . Cedax [Ceftibuten] Other (See Comments)    Possibly nausea/vomiting, cyanosis, severe headaches  . Lipitor [Atorvastatin] Other (See Comments)    REACTION: Myalgia  . Plavix [Clopidogrel Bisulfate] Other (See Comments)    Possibly nausea/vomiting, cyanosis, severe headaches    . Statins Other (See Comments)    Myalgias, cyanosis    Review of Systems negative except from HPI and PMH  Physical Exam BP (!) 160/72   Pulse 70   Ht 4\' 11"  (1.499 m)   Wt 153 lb (69.4 kg)   SpO2 96%   BMI 30.90  kg/m  Well developed and well nourished in no acute distress HENT normal Neck supple with JVP-flat Clear Device pocket well healed; without hematoma or erythema.  There is no tethering  Regular rate and rhythm, 2/6 murmur Abd-soft with active BS No Clubbing cyanosis 2+ edema Skin-warm and dry A & Oriented  Grossly normal sensory and motor function  ECG AV pacing  Assessment and  Plan  Hypertension    HFpEF  Complete heart block  Pacemaker-St. Jude The patient's device was interrogated.  The information was reviewed. No changes were made in the programming.    Coronary disease with prior LAD POBA Without symptoms of ischemia  Atrial flutter/tachycardia  Recurrent atrial tachycardia.  We have reprogrammed the max tracking rate so as to decrease ventricular rates during these episodes; they are relatively short.  Mildly volume overloaded.  We will have her increase her furosemide from 40/20--60 every morning.  This will hopefully achieve a threshold effect  Without symptoms of ischemia  Blood pressure is elevated.  We will start home blood pressure monitoring.

## 2019-03-18 NOTE — Patient Instructions (Addendum)
Medication Instructions:  Your physician has recommended you make the following change in your medication:   Take Lasix 60mg  by mouth daily   Labwork: None ordered.  Testing/Procedures: None ordered.  Follow-Up: Your physician wants you to follow-up in: 12 months with Dr . You will receive a reminder letter in the mail two months in advance. If you don't receive a letter, please call our office to schedule the follow-up appointment.  Remote monitoring is used to monitor your Pacemaker of ICD from home. This monitoring reduces the number of office visits required to check your device to one time per year. It allows Graciela Husbands to keep an eye on the functioning of your device to ensure it is working properly.  Any Other Special Instructions Will Be Listed Below (If Applicable).  If you need a refill on your cardiac medications before your next appointment, please call your pharmacy.

## 2019-03-20 LAB — CUP PACEART INCLINIC DEVICE CHECK
Battery Remaining Longevity: 91 mo
Battery Voltage: 2.98 V
Brady Statistic RA Percent Paced: 99.47 %
Brady Statistic RV Percent Paced: 99.81 %
Date Time Interrogation Session: 20210309150200
Implantable Lead Implant Date: 20061229
Implantable Lead Implant Date: 20061229
Implantable Lead Location: 753859
Implantable Lead Location: 753860
Implantable Lead Model: 5076
Implantable Pulse Generator Implant Date: 20170329
Lead Channel Pacing Threshold Amplitude: 1 V
Lead Channel Pacing Threshold Amplitude: 2.125 V
Lead Channel Pacing Threshold Pulse Width: 0.5 ms
Lead Channel Pacing Threshold Pulse Width: 0.6 ms
Lead Channel Sensing Intrinsic Amplitude: 0.7 mV
Lead Channel Setting Pacing Amplitude: 2 V
Lead Channel Setting Pacing Amplitude: 2.375
Lead Channel Setting Pacing Pulse Width: 0.6 ms
Lead Channel Setting Sensing Sensitivity: 6 mV
Pulse Gen Model: 2240
Pulse Gen Serial Number: 7892876

## 2019-04-01 ENCOUNTER — Ambulatory Visit (INDEPENDENT_AMBULATORY_CARE_PROVIDER_SITE_OTHER): Payer: Medicare Other | Admitting: *Deleted

## 2019-04-01 DIAGNOSIS — I442 Atrioventricular block, complete: Secondary | ICD-10-CM

## 2019-04-01 LAB — CUP PACEART REMOTE DEVICE CHECK
Battery Remaining Longevity: 95 mo
Battery Remaining Percentage: 95.5 %
Battery Voltage: 2.98 V
Brady Statistic AP VP Percent: 98 %
Brady Statistic AP VS Percent: 1 %
Brady Statistic AS VP Percent: 1.8 %
Brady Statistic AS VS Percent: 1 %
Brady Statistic RA Percent Paced: 98 %
Brady Statistic RV Percent Paced: 99 %
Date Time Interrogation Session: 20210323020013
Implantable Lead Implant Date: 20061229
Implantable Lead Implant Date: 20061229
Implantable Lead Location: 753859
Implantable Lead Location: 753860
Implantable Lead Model: 5076
Implantable Pulse Generator Implant Date: 20170329
Lead Channel Impedance Value: 380 Ohm
Lead Channel Impedance Value: 690 Ohm
Lead Channel Pacing Threshold Amplitude: 1 V
Lead Channel Pacing Threshold Amplitude: 2.125 V
Lead Channel Pacing Threshold Pulse Width: 0.5 ms
Lead Channel Pacing Threshold Pulse Width: 0.6 ms
Lead Channel Sensing Intrinsic Amplitude: 0.7 mV
Lead Channel Sensing Intrinsic Amplitude: 12 mV
Lead Channel Setting Pacing Amplitude: 2 V
Lead Channel Setting Pacing Amplitude: 2.375
Lead Channel Setting Pacing Pulse Width: 0.6 ms
Lead Channel Setting Sensing Sensitivity: 6 mV
Pulse Gen Model: 2240
Pulse Gen Serial Number: 7892876

## 2019-04-02 NOTE — Progress Notes (Signed)
PPM Remote  

## 2019-07-01 ENCOUNTER — Ambulatory Visit (INDEPENDENT_AMBULATORY_CARE_PROVIDER_SITE_OTHER): Payer: Medicare Other | Admitting: *Deleted

## 2019-07-01 DIAGNOSIS — I442 Atrioventricular block, complete: Secondary | ICD-10-CM

## 2019-07-02 LAB — CUP PACEART REMOTE DEVICE CHECK
Battery Remaining Longevity: 89 mo
Battery Remaining Percentage: 95.5 %
Battery Voltage: 2.96 V
Brady Statistic AP VP Percent: 98 %
Brady Statistic AP VS Percent: 1 %
Brady Statistic AS VP Percent: 2.3 %
Brady Statistic AS VS Percent: 1 %
Brady Statistic RA Percent Paced: 97 %
Brady Statistic RV Percent Paced: 99 %
Date Time Interrogation Session: 20210623015013
Implantable Lead Implant Date: 20061229
Implantable Lead Implant Date: 20061229
Implantable Lead Location: 753859
Implantable Lead Location: 753860
Implantable Lead Model: 5076
Implantable Pulse Generator Implant Date: 20170329
Lead Channel Impedance Value: 380 Ohm
Lead Channel Impedance Value: 700 Ohm
Lead Channel Pacing Threshold Amplitude: 1 V
Lead Channel Pacing Threshold Amplitude: 1.875 V
Lead Channel Pacing Threshold Pulse Width: 0.5 ms
Lead Channel Pacing Threshold Pulse Width: 0.6 ms
Lead Channel Sensing Intrinsic Amplitude: 0.5 mV
Lead Channel Sensing Intrinsic Amplitude: 12 mV
Lead Channel Setting Pacing Amplitude: 2 V
Lead Channel Setting Pacing Amplitude: 2.125
Lead Channel Setting Pacing Pulse Width: 0.6 ms
Lead Channel Setting Sensing Sensitivity: 6 mV
Pulse Gen Model: 2240
Pulse Gen Serial Number: 7892876

## 2019-07-02 NOTE — Progress Notes (Signed)
Remote pacemaker transmission.   

## 2019-07-28 ENCOUNTER — Telehealth: Payer: Self-pay

## 2019-07-28 NOTE — Telephone Encounter (Signed)
Patient daughter called in concerning patients pacemaker site. If you can read the mychart message that the patient has sent and call the daughter back at [phone]

## 2019-07-28 NOTE — Telephone Encounter (Signed)
Please see my chart note from 07/28/19.

## 2019-07-31 ENCOUNTER — Ambulatory Visit (INDEPENDENT_AMBULATORY_CARE_PROVIDER_SITE_OTHER): Payer: Medicare Other | Admitting: Emergency Medicine

## 2019-07-31 ENCOUNTER — Other Ambulatory Visit: Payer: Self-pay

## 2019-07-31 DIAGNOSIS — I442 Atrioventricular block, complete: Secondary | ICD-10-CM

## 2019-07-31 LAB — CUP PACEART INCLINIC DEVICE CHECK
Date Time Interrogation Session: 20210722170952
Implantable Lead Implant Date: 20061229
Implantable Lead Implant Date: 20061229
Implantable Lead Location: 753859
Implantable Lead Location: 753860
Implantable Lead Model: 5076
Implantable Pulse Generator Implant Date: 20170329
Pulse Gen Model: 2240
Pulse Gen Serial Number: 7892876

## 2019-07-31 NOTE — Progress Notes (Signed)
Patient seen in device clinic today for wound check. Patient concerned about raised area. Raised area is noted to be the device itself. Wound without redness or edema. Incision edges approximated. No abnormalities noted to site.   Advised patient to call device clinic [phone] if she notices any swelling, redness, drainage, fever or chills. Verbalizes understanding.

## 2019-09-30 ENCOUNTER — Ambulatory Visit (INDEPENDENT_AMBULATORY_CARE_PROVIDER_SITE_OTHER): Payer: Medicare Other | Admitting: *Deleted

## 2019-09-30 DIAGNOSIS — I442 Atrioventricular block, complete: Secondary | ICD-10-CM | POA: Diagnosis not present

## 2019-09-30 LAB — CUP PACEART REMOTE DEVICE CHECK
Battery Remaining Longevity: 89 mo
Battery Remaining Percentage: 95.5 %
Battery Voltage: 2.96 V
Brady Statistic AP VP Percent: 97 %
Brady Statistic AP VS Percent: 1 %
Brady Statistic AS VP Percent: 2.6 %
Brady Statistic AS VS Percent: 1 %
Brady Statistic RA Percent Paced: 97 %
Brady Statistic RV Percent Paced: 99 %
Date Time Interrogation Session: 20210921020014
Implantable Lead Implant Date: 20061229
Implantable Lead Implant Date: 20061229
Implantable Lead Location: 753859
Implantable Lead Location: 753860
Implantable Lead Model: 5076
Implantable Pulse Generator Implant Date: 20170329
Lead Channel Impedance Value: 380 Ohm
Lead Channel Impedance Value: 740 Ohm
Lead Channel Pacing Threshold Amplitude: 1 V
Lead Channel Pacing Threshold Amplitude: 2.125 V
Lead Channel Pacing Threshold Pulse Width: 0.5 ms
Lead Channel Pacing Threshold Pulse Width: 0.6 ms
Lead Channel Sensing Intrinsic Amplitude: 0.4 mV
Lead Channel Sensing Intrinsic Amplitude: 12 mV
Lead Channel Setting Pacing Amplitude: 2 V
Lead Channel Setting Pacing Amplitude: 2.375
Lead Channel Setting Pacing Pulse Width: 0.6 ms
Lead Channel Setting Sensing Sensitivity: 6 mV
Pulse Gen Model: 2240
Pulse Gen Serial Number: 7892876

## 2019-10-01 NOTE — Progress Notes (Signed)
Remote pacemaker transmission.   

## 2019-12-31 ENCOUNTER — Ambulatory Visit (INDEPENDENT_AMBULATORY_CARE_PROVIDER_SITE_OTHER): Payer: Medicare Other

## 2019-12-31 DIAGNOSIS — I442 Atrioventricular block, complete: Secondary | ICD-10-CM

## 2020-01-04 LAB — CUP PACEART REMOTE DEVICE CHECK
Battery Remaining Longevity: 91 mo
Battery Remaining Percentage: 95.5 %
Battery Voltage: 2.96 V
Brady Statistic AP VP Percent: 96 %
Brady Statistic AP VS Percent: 1 %
Brady Statistic AS VP Percent: 4 %
Brady Statistic AS VS Percent: 1 %
Brady Statistic RA Percent Paced: 96 %
Brady Statistic RV Percent Paced: 99 %
Date Time Interrogation Session: 20211222020016
Implantable Lead Implant Date: 20061229
Implantable Lead Implant Date: 20061229
Implantable Lead Location: 753859
Implantable Lead Location: 753860
Implantable Lead Model: 5076
Implantable Pulse Generator Implant Date: 20170329
Lead Channel Impedance Value: 390 Ohm
Lead Channel Impedance Value: 750 Ohm
Lead Channel Pacing Threshold Amplitude: 1 V
Lead Channel Pacing Threshold Amplitude: 2 V
Lead Channel Pacing Threshold Pulse Width: 0.5 ms
Lead Channel Pacing Threshold Pulse Width: 0.6 ms
Lead Channel Sensing Intrinsic Amplitude: 0.7 mV
Lead Channel Sensing Intrinsic Amplitude: 12 mV
Lead Channel Setting Pacing Amplitude: 2 V
Lead Channel Setting Pacing Amplitude: 2.25 V
Lead Channel Setting Pacing Pulse Width: 0.6 ms
Lead Channel Setting Sensing Sensitivity: 6 mV
Pulse Gen Model: 2240
Pulse Gen Serial Number: 7892876

## 2020-01-14 NOTE — Progress Notes (Signed)
Remote pacemaker transmission.   

## 2020-03-18 ENCOUNTER — Encounter: Payer: Medicare Other | Admitting: Internal Medicine

## 2020-03-31 ENCOUNTER — Ambulatory Visit: Payer: Medicare Other

## 2020-04-15 LAB — CUP PACEART REMOTE DEVICE CHECK
Battery Remaining Longevity: 83 mo
Battery Remaining Percentage: 89 %
Battery Voltage: 2.95 V
Brady Statistic AP VP Percent: 95 %
Brady Statistic AP VS Percent: 1 %
Brady Statistic AS VP Percent: 4.5 %
Brady Statistic AS VS Percent: 1 %
Brady Statistic RA Percent Paced: 95 %
Brady Statistic RV Percent Paced: 99 %
Date Time Interrogation Session: 20220323020012
Implantable Lead Implant Date: 20061229
Implantable Lead Implant Date: 20061229
Implantable Lead Location: 753859
Implantable Lead Location: 753860
Implantable Lead Model: 5076
Implantable Pulse Generator Implant Date: 20170329
Lead Channel Impedance Value: 360 Ohm
Lead Channel Impedance Value: 750 Ohm
Lead Channel Pacing Threshold Amplitude: 1 V
Lead Channel Pacing Threshold Amplitude: 2 V
Lead Channel Pacing Threshold Pulse Width: 0.5 ms
Lead Channel Pacing Threshold Pulse Width: 0.6 ms
Lead Channel Sensing Intrinsic Amplitude: 0.7 mV
Lead Channel Sensing Intrinsic Amplitude: 12 mV
Lead Channel Setting Pacing Amplitude: 2 V
Lead Channel Setting Pacing Amplitude: 2.25 V
Lead Channel Setting Pacing Pulse Width: 0.6 ms
Lead Channel Setting Sensing Sensitivity: 6 mV
Pulse Gen Model: 2240
Pulse Gen Serial Number: 7892876

## 2020-04-21 ENCOUNTER — Other Ambulatory Visit: Payer: Self-pay | Admitting: Internal Medicine

## 2020-05-06 DIAGNOSIS — I4892 Unspecified atrial flutter: Secondary | ICD-10-CM | POA: Insufficient documentation

## 2020-05-18 ENCOUNTER — Encounter: Payer: Self-pay | Admitting: Internal Medicine

## 2020-05-18 ENCOUNTER — Other Ambulatory Visit: Payer: Self-pay

## 2020-05-18 ENCOUNTER — Ambulatory Visit: Payer: Medicare Other | Admitting: Internal Medicine

## 2020-05-18 VITALS — BP 156/70 | HR 71 | Ht 59.0 in | Wt 164.4 lb

## 2020-05-18 DIAGNOSIS — I442 Atrioventricular block, complete: Secondary | ICD-10-CM | POA: Diagnosis not present

## 2020-05-18 DIAGNOSIS — I4892 Unspecified atrial flutter: Secondary | ICD-10-CM | POA: Diagnosis not present

## 2020-05-18 DIAGNOSIS — R600 Localized edema: Secondary | ICD-10-CM

## 2020-05-18 DIAGNOSIS — Z950 Presence of cardiac pacemaker: Secondary | ICD-10-CM | POA: Diagnosis not present

## 2020-05-18 DIAGNOSIS — I509 Heart failure, unspecified: Secondary | ICD-10-CM | POA: Diagnosis not present

## 2020-05-18 MED ORDER — FUROSEMIDE 80 MG PO TABS: 80.0000 mg | ORAL_TABLET | Freq: Every day | ORAL | 3 refills | Status: DC

## 2020-05-18 NOTE — Progress Notes (Signed)
Patient Care Team: Tracey Harries, MD as PCP - General (Family Medicine) Duke Salvia, MD (Cardiology)   HPI  Brittany [name] is a 87 y.o. female is seen in followup for complete heart block; she is status post pacemaker implantation and  underwent generator replacement 2017    She has a history of prior LAD POBA with normal left ventricular function.    She has a history of atrial flutter flutter that appeared to be typical. Entrainment mapping, however, demonstrated a left atrial flutter and she was there  cardioverted. She is on anticoagulation w apixoban.  no bleeding  The patient denies chest pain, nocturnal dyspnea, orthopnea.  There have been no palpitations, lightheadedness or syncope.   Markedly limited activity.  Since significant bilateral edema.  Longstanding venous varicosities.    Date Cr K Hgb  11/19 0.71 3.9 11.4   12/20 (CE)  0.69 4.2 12.5   3/22 (CE)  0.72 3.8 11.4     Echocardiogram 12/16 demonstrated normal LV function and moderate-severe LAE (45/22.2/45)  Past Medical History:  Diagnosis Date  . Atrial flutter (HCC)   . CAD (coronary artery disease)   . CHF (congestive heart failure) (HCC)   . Complete heart block (HCC)    a. s/p STJ dual chamber PPM  . Depression   . Glaucoma   . Hyperlipidemia   . Hypertension   . Type II diabetes mellitus (HCC)     Past Surgical History:  Procedure Laterality Date  . BREAST BIOPSY  1991  . ELECTROPHYSIOLOGIC STUDY N/A 02/12/2015   cardioversion by Dr Graciela Husbands   . EP IMPLANTABLE DEVICE N/A 04/07/2015   a. STJ dual chamber PPM implanted 2006 for complete heart block b. gen change 2017  . FEMUR IM NAIL  01/18/2012   Procedure: INTRAMEDULLARY (IM) NAIL FEMORAL;  Surgeon: Senaida Lange, MD;  Location: [facility] OR;  Service: Orthopedics;  Laterality: Left;  . left total knee replacement  1996  . TONSILLECTOMY  1937    Current Outpatient Medications  Medication Sig Dispense Refill  . acetaminophen (TYLENOL) 325  MG tablet Take 650 mg by mouth as needed.    . carvedilol (COREG) 25 MG tablet TAKE 1/2 TABLET TWICE A DAY 30 tablet 10  . docusate sodium (COLACE) 100 MG capsule Take 100 mg by mouth daily.    Marland Kitchen ELIQUIS 5 MG TABS tablet TAKE 1 TABLET TWICE A DAY 60 tablet 6  . furosemide (LASIX) 40 MG tablet Take 1.5 tablets (60 mg total) by mouth daily. Please keep upcoming appt in May 2022 with Dr. Graciela Husbands before anymore refills. Thank you 135 tablet 0  . glucose blood test strip USE FOR TESTING ONCE DAILY    . hydrocortisone (ANUSOL-HC) 25 MG suppository Insert 1 suppository in the rectum before bedtime for 10 days for internal hemorrhoids. Repeat as needed. (Patient taking differently: as needed for hemorrhoids or anal itching. Insert 1 suppository in the rectum before bedtime for 10 days for internal hemorrhoids. Repeat as needed.) 10 suppository 6  . irbesartan (AVAPRO) 75 MG tablet Take 0.5 tablets (37.5 mg total) by mouth daily. 45 tablet 2  . isosorbide mononitrate (IMDUR) 30 MG 24 hr tablet Take 1 tablet by mouth daily.    . metFORMIN (GLUCOPHAGE-XR) 500 MG 24 hr tablet Take 1,000 mg by mouth 2 (two) times daily.    . Multiple Vitamin (MULTIVITAMIN) tablet Take 1 tablet by mouth daily. CENTRUM SILVER    . ONETOUCH DELICA LANCETS  33G MISC     . potassium chloride 20 MEQ TBCR Take 20 mEq by mouth daily. 30 tablet 0  . cyanocobalamin 1000 MCG tablet Take by mouth daily.     No current facility-administered medications for this visit.    Allergies  Allergen Reactions  . Ciprofloxacin Other (See Comments)    Severe chest pains  . Ace Inhibitors Other (See Comments)    REACTION: cough and sore throat  . Cedax [Ceftibuten] Other (See Comments)    Possibly nausea/vomiting, cyanosis, severe headaches  . Lipitor [Atorvastatin] Other (See Comments)    REACTION: Myalgia  . Plavix [Clopidogrel Bisulfate] Other (See Comments)    Possibly nausea/vomiting, cyanosis, severe headaches    . Statins Other (See  Comments)    Myalgias, cyanosis    Review of Systems negative except from HPI and PMH  Physical Exam BP (!) 156/70   Pulse 71   Ht 4\' 11"  (1.499 m)   Wt 164 lb 6.4 oz (74.6 kg)   SpO2 94%   BMI 33.20 kg/m  Well developed and well nourished in no acute distress HENT normal Neck supple with JVP-flat Clear Device pocket well healed; without hematoma or erythema.  There is no tethering  Regular rate and rhythm, no * murmur Abd-soft with active BS No Clubbing cyanosis 3+ edema Venous varicosities Skin-warm and dry A & Oriented  Grossly normal sensory and motor function  ECG AV pacing at 71  Assessment and  Plan  Hypertension    HFpEF  Complete heart block  Pacemaker-St. Jude    Coronary disease with prior LAD POBA    Atrial flutter/tachycardia   Blood pressure remains elevated but at home with been better.  We will continue her on Avapro and carvedilol  Infrequent atrial fibrillation.  Continue her on Eliquis.  Dosed appropriately given her renal function and her weight  Significant bilateral edema.  I suspect much of this is venous insufficiency, and discussed with Dr. and will refer to [facility] to see if we can get some help with this either none procedural or procedural.  I think if her feet weight 10 pounds less each should be much more able to get around  No ischemia.  She is not on statins.  In her 90s, I would not be aggressive about the use of a PCSK9 or acetamide  With complete heart block and ventricular pacing and her peripheral edema will reassess LV function to make sure there is not a component of HFrEF

## 2020-05-18 NOTE — Patient Instructions (Addendum)
Medication Instructions:  Your physician has recommended you make the following change in your medication:   Begin taking Lasix 80mg  - 1 tablet by mouth daily.  *If you need a refill on your cardiac medications before your next appointment, please call your pharmacy*   Lab Work: None ordered.   If you have labs (blood work) drawn today and your tests are completely normal, you will receive your results only by: MyChart Message (if you have MyChart) OR . A paper copy in the mail If you have any lab test that is abnormal or we need to change your treatment, we will call you to review the results.   Testing/Procedures: Your physician has requested that you have an echocardiogram. Echocardiography is a painless test that uses sound waves to create images of your heart. It provides your doctor with information about the size and shape of your heart and how well your heart's chambers and valves are working. This procedure takes approximately one hour. There are no restrictions for this procedure.    Follow-Up: At [facility], you and your health needs are our priority.  As part of our continuing mission to provide you with exceptional heart care, we have created designated Provider Care Teams.  These Care Teams include your primary Cardiologist (physician) and Advanced Practice Providers (APPs -  Physician Assistants and Nurse Practitioners) who all work together to provide you with the care you need, when you need it.  We recommend signing up for the patient portal called "MyChart".  Sign up information is provided on this After Visit Summary.  MyChart is used to connect with patients for Virtual Visits (Telemedicine).  Patients are able to view lab/test results, encounter notes, upcoming appointments, etc.  Non-urgent messages can be sent to your provider as well.   To learn more about what you can do with MyChart, go to [facility].    Your next appointment:   6  month(s)  The format for your next appointment:   In Person  Provider:   You will see one of the following Advanced Practice Providers on your designated Care Team:    [email or web address], NP  Gypsy Balsam, PA-C  Francis Dowse "Casimiro Needle, Otilio Saber   Other Instructions Referral to Vein and Vascular Specialist

## 2020-05-28 ENCOUNTER — Other Ambulatory Visit: Payer: Self-pay | Admitting: *Deleted

## 2020-05-28 DIAGNOSIS — R200 Anesthesia of skin: Secondary | ICD-10-CM

## 2020-06-15 ENCOUNTER — Ambulatory Visit (HOSPITAL_COMMUNITY): Payer: Medicare Other | Attending: Internal Medicine

## 2020-06-15 ENCOUNTER — Other Ambulatory Visit: Payer: Self-pay

## 2020-06-15 DIAGNOSIS — I4892 Unspecified atrial flutter: Secondary | ICD-10-CM | POA: Diagnosis present

## 2020-06-15 DIAGNOSIS — R600 Localized edema: Secondary | ICD-10-CM | POA: Diagnosis not present

## 2020-06-15 DIAGNOSIS — I509 Heart failure, unspecified: Secondary | ICD-10-CM

## 2020-06-15 LAB — ECHOCARDIOGRAM COMPLETE
Area-P 1/2: 3.65 cm2
P 1/2 time: 372 msec
S' Lateral: 3 cm

## 2020-06-22 ENCOUNTER — Encounter: Payer: Self-pay | Admitting: Vascular Surgery

## 2020-06-22 ENCOUNTER — Encounter (HOSPITAL_COMMUNITY): Payer: Medicare Other

## 2020-06-22 ENCOUNTER — Ambulatory Visit: Payer: Medicare Other | Admitting: Vascular Surgery

## 2020-06-22 ENCOUNTER — Ambulatory Visit (HOSPITAL_COMMUNITY)
Admission: RE | Admit: 2020-06-22 | Discharge: 2020-06-22 | Disposition: A | Discharge status: Home / Self Care | Payer: Medicare Other | Source: Ambulatory Visit | Attending: Vascular Surgery | Admitting: Vascular Surgery

## 2020-06-22 ENCOUNTER — Other Ambulatory Visit: Payer: Self-pay | Admitting: Internal Medicine

## 2020-06-22 ENCOUNTER — Other Ambulatory Visit: Payer: Self-pay

## 2020-06-22 DIAGNOSIS — M7989 Other specified soft tissue disorders: Secondary | ICD-10-CM | POA: Insufficient documentation

## 2020-06-22 DIAGNOSIS — R200 Anesthesia of skin: Secondary | ICD-10-CM

## 2020-06-22 NOTE — Progress Notes (Signed)
Patient name: Brittany [name] MRN: [id number] DOB: Apr 14, 1927 Sex: female  REASON FOR CONSULT: Evaluate for lower extremity edema and venous insufficiency  HPI: Brittany [name] is a 87 y.o. female, with history of hypertension, hyperlipidemia, diabetes, complete heart block status post pacemaker, aflutter that presents for evaluation of lower extremity edema and venous insufficiency.  Patient is here with her daughter and sounds like she has had leg swelling for years.  She is ambulatory with a walker but spends much of her time on the couch.  She was recently seen by Dr. Graciela Husbands her cardiologist who recommended a visit to our office for further evaluation.  Apparently she has some compression stockings that she wears sometimes but they usually do not fit.  She has no lower extremity symptoms including pain, itching, burning etc other than just leg swelling.  She feels the leg swelling is not bothersome.  Past Medical History:  Diagnosis Date   Atrial flutter (HCC)    CAD (coronary artery disease)    CHF (congestive heart failure) (HCC)    Complete heart block (HCC)    a. s/p STJ dual chamber PPM   Depression    Glaucoma    Hyperlipidemia    Hypertension    Type II diabetes mellitus (HCC)     Past Surgical History:  Procedure Laterality Date   BREAST BIOPSY  1991   ELECTROPHYSIOLOGIC STUDY N/A 02/12/2015   cardioversion by Dr Graciela Husbands    EP IMPLANTABLE DEVICE N/A 04/07/2015   a. STJ dual chamber PPM implanted 2006 for complete heart block b. gen change 2017   FEMUR IM NAIL  01/18/2012   Procedure: INTRAMEDULLARY (IM) NAIL FEMORAL;  Surgeon: Senaida Lange, MD;  Location: [facility] OR;  Service: Orthopedics;  Laterality: Left;   left total knee replacement Bilateral 1996   TONSILLECTOMY  1937    Family History  Problem Relation Age of Onset   Diabetes Mother    Heart attack Father    Stroke Father    Hypertension Father    Kidney disease Daughter     SOCIAL HISTORY: Social History    Socioeconomic History   Marital status: Divorced    Spouse name: Not on file   Number of children: 5   Years of education: Not on file   Highest education level: Not on file  Occupational History   Occupation: Retired  Tobacco Use   Smoking status: Former    Pack years: 0.00    Types: Cigarettes    Quit date: 01/17/1951    Years since quitting: 69.4   Smokeless tobacco: Never  Vaping Use   Vaping Use: Never used  Substance and Sexual Activity   Alcohol use: Yes    Comment: Occasional glass of wine   Drug use: No   Sexual activity: Not on file  Other Topics Concern   Not on file  Social History Narrative   Not on file   Social Determinants of Health   Financial Resource Strain: Not on file  Food Insecurity: Not on file  Transportation Needs: Not on file  Physical Activity: Not on file  Stress: Not on file  Social Connections: Not on file  Intimate Partner Violence: Not on file    Allergies  Allergen Reactions   Ciprofloxacin Other (See Comments)    Severe chest pains   Ace Inhibitors Other (See Comments)    REACTION: cough and sore throat   Cedax [Ceftibuten] Other (See Comments)    Possibly nausea/vomiting,  cyanosis, severe headaches   Lipitor [Atorvastatin] Other (See Comments)    REACTION: Myalgia   Plavix [Clopidogrel Bisulfate] Other (See Comments)    Possibly nausea/vomiting, cyanosis, severe headaches     Statins Other (See Comments)    Myalgias, cyanosis    Current Outpatient Medications  Medication Sig Dispense Refill   acetaminophen (TYLENOL) 325 MG tablet Take 650 mg by mouth as needed.     carvedilol (COREG) 25 MG tablet TAKE 1/2 TABLET TWICE A DAY 30 tablet 10   cyanocobalamin 1000 MCG tablet Take by mouth daily.     docusate sodium (COLACE) 100 MG capsule Take 100 mg by mouth daily.     ELIQUIS 5 MG TABS tablet TAKE 1 TABLET TWICE A DAY 60 tablet 6   furosemide (LASIX) 80 MG tablet Take 1 tablet (80 mg total) by mouth daily. 90 tablet 3    glucose blood test strip USE FOR TESTING ONCE DAILY     hydrocortisone (ANUSOL-HC) 25 MG suppository Insert 1 suppository in the rectum before bedtime for 10 days for internal hemorrhoids. Repeat as needed. (Patient taking differently: as needed for hemorrhoids or anal itching. Insert 1 suppository in the rectum before bedtime for 10 days for internal hemorrhoids. Repeat as needed.) 10 suppository 6   irbesartan (AVAPRO) 75 MG tablet Take 0.5 tablets (37.5 mg total) by mouth daily. 45 tablet 2   isosorbide mononitrate (IMDUR) 30 MG 24 hr tablet Take 1 tablet by mouth daily.     metFORMIN (GLUCOPHAGE-XR) 500 MG 24 hr tablet Take 1,000 mg by mouth 2 (two) times daily.     Multiple Vitamin (MULTIVITAMIN) tablet Take 1 tablet by mouth daily. CENTRUM SILVER     ONETOUCH DELICA LANCETS 33G MISC      potassium chloride 20 MEQ TBCR Take 20 mEq by mouth daily. 30 tablet 0   No current facility-administered medications for this visit.    REVIEW OF SYSTEMS:   denotes positive finding,  denotes negative finding Cardiac  Comments:  Chest pain or chest pressure:    Shortness of breath upon exertion:    Short of breath when lying flat:    Irregular heart rhythm:        Vascular    Pain in calf, thigh, or hip brought on by ambulation:    Pain in feet at night that wakes you up from your sleep:     Blood clot in your veins:    Leg swelling:  x Bilateral      Pulmonary    Oxygen at home:    Productive cough:     Wheezing:         Neurologic    Sudden weakness in arms or legs:     Sudden numbness in arms or legs:     Sudden onset of difficulty speaking or slurred speech:    Temporary loss of vision in one eye:     Problems with dizziness:         Gastrointestinal    Blood in stool:     Vomited blood:         Genitourinary    Burning when urinating:     Blood in urine:        Psychiatric    Major depression:         Hematologic    Bleeding problems:    Problems with blood  clotting too easily:        Skin    Rashes or ulcers:  Constitutional    Fever or chills:      PHYSICAL EXAM: Vitals:   06/22/20 1437  BP: (!) 159/80  Pulse: 72  Temp: 98.7 F (37.1 C)  TempSrc: Skin  SpO2: 96%  Weight: 158 lb 3.2 oz (71.8 kg)  Height: 4\' 11"  (1.499 m)    GENERAL: The patient is a well-nourished female, in no acute distress. The vital signs are documented above. CARDIAC: There is a regular rate and rhythm.  VASCULAR:  Palpable DP pulses bilaterally Appreciable lower extremity edema but not marked Notable spider veins and reticular veins bilaterally PULMONARY: No respiratory distress. ABDOMEN: Soft and non-tender. MUSCULOSKELETAL: There are no major deformities or cyanosis. NEUROLOGIC: No focal weakness or paresthesias are detected. SKIN: There are no ulcers or rashes noted. PSYCHIATRIC: The patient has a normal affect.  DATA:   Indications: Swelling.     Limitations: Body habitus.  Performing Technologist: RVT      Examination Guidelines: A complete evaluation includes B-mode imaging,  spectral  Doppler, color Doppler, and power Doppler as needed of all accessible  portions  of each vessel. Bilateral testing is considered an integral part of a  complete  examination. Limited examinations for reoccurring indications may be  performed  as noted. The reflux portion of the exam is performed with the patient in  reverse Trendelenburg.  Significant venous reflux is defined as >500 ms in the superficial venous  system, and >1 second in the deep venous system.      Venous Reflux Times  +--------------+---------+------+-----------+------------+----------------+   RIGHT         Reflux NoRefluxReflux TimeDiameter cmsComments                                    Yes                                             +--------------+---------+------+-----------+------------+----------------+   CFV           no                                                         +--------------+---------+------+-----------+------------+----------------+   FV mid        no                                                        +--------------+---------+------+-----------+------------+----------------+   Popliteal     no                                                        +--------------+---------+------+-----------+------------+----------------+   GSV at SFJ    no                           0.485                        +--------------+---------+------+-----------+------------+----------------+  GSV prox thighno                           0.369                        +--------------+---------+------+-----------+------------+----------------+   GSV mid thigh no                            0.28                        +--------------+---------+------+-----------+------------+----------------+   GSV dist thigh                             0.196                        +--------------+---------+------+-----------+------------+----------------+   GSV at knee   no                           0.237                        +--------------+---------+------+-----------+------------+----------------+   SSV Pop Fossa no                           0.315                        +--------------+---------+------+-----------+------------+----------------+   SSV prox calf no                           0.328                        +--------------+---------+------+-----------+------------+----------------+   SSV mid calf  no                           0.195    chronic  thrombus  +--------------+---------+------+-----------+------------+----------------+       +--------------+---------+------+-----------+------------+----------------+   LEFT          Reflux NoRefluxReflux TimeDiameter cmsComments                                    Yes                                              +--------------+---------+------+-----------+------------+----------------+   CFV           no                                                        +--------------+---------+------+-----------+------------+----------------+   FV mid        no                                                        +--------------+---------+------+-----------+------------+----------------+  Popliteal     no                                                        +--------------+---------+------+-----------+------------+----------------+   GSV at [facility]    no                           0.582                        +--------------+---------+------+-----------+------------+----------------+   GSV prox thighno                           0.479                        +--------------+---------+------+-----------+------------+----------------+   GSV mid thigh                              0.335                        +--------------+---------+------+-----------+------------+----------------+   GSV dist thighno                           0.417                        +--------------+---------+------+-----------+------------+----------------+   GSV at knee   no                           0.308                        +--------------+---------+------+-----------+------------+----------------+   GSV prox calf           yes    >500 ms     0.269                        +--------------+---------+------+-----------+------------+----------------+   SSV Pop Fossa no                           0.435                        +--------------+---------+------+-----------+------------+----------------+   SSV prox calf no                           0.239                        +--------------+---------+------+-----------+------------+----------------+   SSV mid calf  no                           0.213     chronic  thrombus  +--------------+---------+------+-----------+------------+----------------+            Summary:  Right:  - No evidence of deep vein thrombosis seen in the right lower extremity,  from the common femoral through the popliteal veins.  - Chronic thrombus  in the SSV.  - No evidence of deep vein reflux.  -No evidence of superficial vein reflux.      Left:  - No evidence of deep vein thrombosis seen in the left lower extremity,  from the common femoral through the popliteal veins.  - Chronic thrombus in the SSV.  - No evidence of deep vein reflux.  - Superficial vein reflux in the GSV proximal calf.     *See table(s) above for measurements and observations.   Electronically signed by Sherald Hesshristopher Prisila Dlouhy MD on 06/22/2020 at 3:04:54 PM.   Assessment/Plan:  87 year old female presents for evaluation of bilateral lower extremity swelling and venous insufficiency.  Discussed with her and her daughter that her venous reflux study today really shows no evidence of significant lower extremity reflux to suggest venous insufficiency as primary underlying etiology.  I discussed even if she did have significant reflux we normally manage this conservatively with leg elevation, compression and exercise.  She is really having no symptoms and has no heaviness, achiness, pain burning, etc.  She feels the swelling is not bothersome.  We did size her for some knee-high compression stockings at her daughters request just to see if this will help alleviate and control her swelling which is primary concern.  No evidence of DVT.   Cephus Shellinghristopher J. Larna Capelle, MD [facility] Office: [phone]

## 2020-06-30 ENCOUNTER — Ambulatory Visit (INDEPENDENT_AMBULATORY_CARE_PROVIDER_SITE_OTHER): Payer: Medicare Other

## 2020-06-30 DIAGNOSIS — I442 Atrioventricular block, complete: Secondary | ICD-10-CM | POA: Diagnosis not present

## 2020-06-30 LAB — CUP PACEART REMOTE DEVICE CHECK
Battery Remaining Longevity: 30 mo
Battery Remaining Percentage: 33 %
Battery Voltage: 2.95 V
Brady Statistic AP VP Percent: 95 %
Brady Statistic AP VS Percent: 1 %
Brady Statistic AS VP Percent: 5.5 %
Brady Statistic AS VS Percent: 1 %
Brady Statistic RA Percent Paced: 94 %
Brady Statistic RV Percent Paced: 99 %
Date Time Interrogation Session: 20220622020015
Implantable Lead Implant Date: 20061229
Implantable Lead Implant Date: 20061229
Implantable Lead Location: 753859
Implantable Lead Location: 753860
Implantable Lead Model: 5076
Implantable Pulse Generator Implant Date: 20170329
Lead Channel Impedance Value: 380 Ohm
Lead Channel Impedance Value: 830 Ohm
Lead Channel Pacing Threshold Amplitude: 1 V
Lead Channel Pacing Threshold Amplitude: 1.375 V
Lead Channel Pacing Threshold Pulse Width: 0.5 ms
Lead Channel Pacing Threshold Pulse Width: 1 ms
Lead Channel Sensing Intrinsic Amplitude: 0.5 mV
Lead Channel Sensing Intrinsic Amplitude: 12 mV
Lead Channel Setting Pacing Amplitude: 1.625
Lead Channel Setting Pacing Amplitude: 2 V
Lead Channel Setting Pacing Pulse Width: 1 ms
Lead Channel Setting Sensing Sensitivity: 6 mV
Pulse Gen Model: 2240
Pulse Gen Serial Number: 7892876

## 2020-07-06 ENCOUNTER — Telehealth: Payer: Self-pay

## 2020-07-06 NOTE — Telephone Encounter (Signed)
-----   Message from Duke Salvia, MD sent at 07/01/2020  4:34 PM EDT ----- Please Inform Patient Echo showed  mostly stableand nearly normal  heart muscle function  50-55% from 60% or so

## 2020-07-06 NOTE — Telephone Encounter (Signed)
Attempted phone call to pt.  Per Epic ok to leave voicemail message.  Pt advised per Dr Graciela Husbands echo shows stable and nearly normal heart muscle function.  Please call for additional questions or concerns.

## 2020-07-20 NOTE — Progress Notes (Signed)
Remote pacemaker transmission.   

## 2020-09-29 ENCOUNTER — Ambulatory Visit (INDEPENDENT_AMBULATORY_CARE_PROVIDER_SITE_OTHER): Payer: Medicare Other

## 2020-09-29 DIAGNOSIS — I442 Atrioventricular block, complete: Secondary | ICD-10-CM | POA: Diagnosis not present

## 2020-09-29 LAB — CUP PACEART REMOTE DEVICE CHECK
Battery Remaining Longevity: 26 mo
Battery Remaining Percentage: 30 %
Battery Voltage: 2.93 V
Brady Statistic AP VP Percent: 92 %
Brady Statistic AP VS Percent: 1 %
Brady Statistic AS VP Percent: 8.2 %
Brady Statistic AS VS Percent: 1 %
Brady Statistic RA Percent Paced: 92 %
Brady Statistic RV Percent Paced: 99 %
Date Time Interrogation Session: 20220921020013
Implantable Lead Implant Date: 20061229
Implantable Lead Implant Date: 20061229
Implantable Lead Location: 753859
Implantable Lead Location: 753860
Implantable Lead Model: 5076
Implantable Pulse Generator Implant Date: 20170329
Lead Channel Impedance Value: 360 Ohm
Lead Channel Impedance Value: 760 Ohm
Lead Channel Pacing Threshold Amplitude: 1 V
Lead Channel Pacing Threshold Amplitude: 1.625 V
Lead Channel Pacing Threshold Pulse Width: 0.5 ms
Lead Channel Pacing Threshold Pulse Width: 1 ms
Lead Channel Sensing Intrinsic Amplitude: 0.5 mV
Lead Channel Sensing Intrinsic Amplitude: 12 mV
Lead Channel Setting Pacing Amplitude: 1.875
Lead Channel Setting Pacing Amplitude: 2 V
Lead Channel Setting Pacing Pulse Width: 1 ms
Lead Channel Setting Sensing Sensitivity: 6 mV
Pulse Gen Model: 2240
Pulse Gen Serial Number: 7892876

## 2020-10-06 NOTE — Progress Notes (Signed)
Remote pacemaker transmission.   

## 2020-12-29 ENCOUNTER — Ambulatory Visit (INDEPENDENT_AMBULATORY_CARE_PROVIDER_SITE_OTHER): Payer: Medicare Other

## 2020-12-29 DIAGNOSIS — I442 Atrioventricular block, complete: Secondary | ICD-10-CM

## 2020-12-29 LAB — CUP PACEART REMOTE DEVICE CHECK
Battery Remaining Longevity: 23 mo
Battery Remaining Percentage: 26 %
Battery Voltage: 2.93 V
Brady Statistic AP VP Percent: 90 %
Brady Statistic AP VS Percent: 1 %
Brady Statistic AS VP Percent: 9.8 %
Brady Statistic AS VS Percent: 1 %
Brady Statistic RA Percent Paced: 90 %
Brady Statistic RV Percent Paced: 99 %
Date Time Interrogation Session: 20221221034930
Implantable Lead Implant Date: 20061229
Implantable Lead Implant Date: 20061229
Implantable Lead Location: 753859
Implantable Lead Location: 753860
Implantable Lead Model: 5076
Implantable Pulse Generator Implant Date: 20170329
Lead Channel Impedance Value: 390 Ohm
Lead Channel Impedance Value: 740 Ohm
Lead Channel Pacing Threshold Amplitude: 1 V
Lead Channel Pacing Threshold Amplitude: 1.75 V
Lead Channel Pacing Threshold Pulse Width: 0.5 ms
Lead Channel Pacing Threshold Pulse Width: 1 ms
Lead Channel Sensing Intrinsic Amplitude: 0.3 mV
Lead Channel Sensing Intrinsic Amplitude: 12 mV
Lead Channel Setting Pacing Amplitude: 2 V
Lead Channel Setting Pacing Amplitude: 2 V
Lead Channel Setting Pacing Pulse Width: 1 ms
Lead Channel Setting Sensing Sensitivity: 6 mV
Pulse Gen Model: 2240
Pulse Gen Serial Number: 7892876

## 2021-01-07 NOTE — Progress Notes (Signed)
Remote pacemaker transmission.   

## 2021-03-30 ENCOUNTER — Ambulatory Visit (INDEPENDENT_AMBULATORY_CARE_PROVIDER_SITE_OTHER): Payer: Medicare Other

## 2021-03-30 DIAGNOSIS — I442 Atrioventricular block, complete: Secondary | ICD-10-CM

## 2021-03-31 LAB — CUP PACEART REMOTE DEVICE CHECK
Battery Remaining Longevity: 20 mo
Battery Remaining Percentage: 23 %
Battery Voltage: 2.92 V
Brady Statistic AP VP Percent: 90 %
Brady Statistic AP VS Percent: 1 %
Brady Statistic AS VP Percent: 10 %
Brady Statistic AS VS Percent: 1 %
Brady Statistic RA Percent Paced: 89 %
Brady Statistic RV Percent Paced: 99 %
Date Time Interrogation Session: 20230322020035
Implantable Lead Implant Date: 20061229
Implantable Lead Implant Date: 20061229
Implantable Lead Location: 753859
Implantable Lead Location: 753860
Implantable Lead Model: 5076
Implantable Pulse Generator Implant Date: 20170329
Lead Channel Impedance Value: 390 Ohm
Lead Channel Impedance Value: 710 Ohm
Lead Channel Pacing Threshold Amplitude: 1 V
Lead Channel Pacing Threshold Amplitude: 1.75 V
Lead Channel Pacing Threshold Pulse Width: 0.5 ms
Lead Channel Pacing Threshold Pulse Width: 1 ms
Lead Channel Sensing Intrinsic Amplitude: 0.3 mV
Lead Channel Sensing Intrinsic Amplitude: 12 mV
Lead Channel Setting Pacing Amplitude: 2 V
Lead Channel Setting Pacing Amplitude: 2 V
Lead Channel Setting Pacing Pulse Width: 1 ms
Lead Channel Setting Sensing Sensitivity: 6 mV
Pulse Gen Model: 2240
Pulse Gen Serial Number: 7892876

## 2021-04-13 NOTE — Progress Notes (Signed)
Remote pacemaker transmission.   

## 2021-06-07 ENCOUNTER — Telehealth: Payer: Self-pay

## 2021-06-07 NOTE — Telephone Encounter (Signed)
Merlin alert for "exceeded AT/AF burden". Presenting rhythm AFlutter with V pacing. While multiple episodes logged, appears to be ongoing/persistent since 06/02/21 due to intermittent blanked AFL waves and therefore frequent MS episodes. History of AFL with CHB. OAC- Eliquis.  Clinic aware per previous (06/02/21) encounter summary and requested notification if persistent >3 days. Routing per request. - JJB  Successful telephone encounter to patient's daughter and caregiver [name] who states patient has not had any complaints of increased shortness of breath, dizziness, lightheadedness, or fatique over the last 5 days. Daughter states patient is compliant with all medications including eliquis and BB. Patient lives with daughter who administers medications. Daughter is provided device clinic contact at [phone] should symptoms occur. Daughter is appreciative of call.

## 2021-06-29 ENCOUNTER — Ambulatory Visit (INDEPENDENT_AMBULATORY_CARE_PROVIDER_SITE_OTHER): Payer: Medicare Other

## 2021-06-29 DIAGNOSIS — I442 Atrioventricular block, complete: Secondary | ICD-10-CM

## 2021-06-30 LAB — CUP PACEART REMOTE DEVICE CHECK
Battery Remaining Longevity: 17 mo
Battery Remaining Percentage: 19 %
Battery Voltage: 2.89 V
Brady Statistic AP VP Percent: 90 %
Brady Statistic AP VS Percent: 1 %
Brady Statistic AS VP Percent: 10 %
Brady Statistic AS VS Percent: 1 %
Brady Statistic RA Percent Paced: 85 %
Brady Statistic RV Percent Paced: 99 %
Date Time Interrogation Session: 20230621023342
Implantable Lead Implant Date: 20061229
Implantable Lead Implant Date: 20061229
Implantable Lead Location: 753859
Implantable Lead Location: 753860
Implantable Lead Model: 5076
Implantable Pulse Generator Implant Date: 20170329
Lead Channel Impedance Value: 380 Ohm
Lead Channel Impedance Value: 680 Ohm
Lead Channel Pacing Threshold Amplitude: 1 V
Lead Channel Pacing Threshold Amplitude: 1.625 V
Lead Channel Pacing Threshold Pulse Width: 0.5 ms
Lead Channel Pacing Threshold Pulse Width: 1 ms
Lead Channel Sensing Intrinsic Amplitude: 0.5 mV
Lead Channel Sensing Intrinsic Amplitude: 12 mV
Lead Channel Setting Pacing Amplitude: 1.875
Lead Channel Setting Pacing Amplitude: 2 V
Lead Channel Setting Pacing Pulse Width: 1 ms
Lead Channel Setting Sensing Sensitivity: 6 mV
Pulse Gen Model: 2240
Pulse Gen Serial Number: 7892876

## 2021-07-09 ENCOUNTER — Other Ambulatory Visit: Payer: Self-pay | Admitting: Internal Medicine

## 2021-07-11 NOTE — Progress Notes (Signed)
Remote pacemaker transmission.   

## 2021-08-15 ENCOUNTER — Other Ambulatory Visit: Payer: Self-pay | Admitting: Internal Medicine

## 2021-08-29 NOTE — Progress Notes (Signed)
Electrophysiology Office Note Date: 09/05/2021  ID:  Brittany [name], Brittany [name] 1927/11/16, MRN [id number]  PCP: Tracey Harries, MD Primary Cardiologist: None Electrophysiologist: Sherryl Manges, MD   CC: Pacemaker follow-up  Brittany [name] is a 88 y.o. female seen today for Sherryl Manges, MD for routine electrophysiology followup.  Since last being seen in our clinic the patient reports doing well overall.  she denies chest pain, palpitations, dyspnea, PND, orthopnea, nausea, vomiting, dizziness, syncope, edema, weight gain, or early satiety.  Device History: STJ dual chamber PPM implanted 2006 for complete heart block; gen change 2017  Past Medical History:  Diagnosis Date   Atrial flutter (HCC)    CAD (coronary artery disease)    CHF (congestive heart failure) (HCC)    Complete heart block (HCC)    a. s/p STJ dual chamber PPM   Depression    Glaucoma    Hyperlipidemia    Hypertension    Type II diabetes mellitus (HCC)    Past Surgical History:  Procedure Laterality Date   BREAST BIOPSY  1991   ELECTROPHYSIOLOGIC STUDY N/A 02/12/2015   cardioversion by Dr Graciela Husbands    EP IMPLANTABLE DEVICE N/A 04/07/2015   a. STJ dual chamber PPM implanted 2006 for complete heart block b. gen change 2017   FEMUR IM NAIL  01/18/2012   Procedure: INTRAMEDULLARY (IM) NAIL FEMORAL;  Surgeon: Senaida Lange, MD;  Location: [facility] OR;  Service: Orthopedics;  Laterality: Left;   left total knee replacement Bilateral 1996   TONSILLECTOMY  1937    Current Outpatient Medications  Medication Sig Dispense Refill   acetaminophen (TYLENOL) 325 MG tablet Take 650 mg by mouth as needed.     carvedilol (COREG) 25 MG tablet TAKE 1/2 TABLET TWICE A DAY 30 tablet 10   cyanocobalamin 1000 MCG tablet Take by mouth daily.     docusate sodium (COLACE) 100 MG capsule Take 100 mg by mouth daily.     ELIQUIS 5 MG TABS tablet TAKE 1 TABLET TWICE A DAY 60 tablet 6   furosemide (LASIX) 80 MG tablet TAKE ONE TABLET ONCE DAILY  (MUST MAKE/KEEP APPOINTMENT FOR REFILLS) 15 tablet 0   glucose blood test strip USE FOR TESTING ONCE DAILY     hydrocortisone (ANUSOL-HC) 25 MG suppository Insert 1 suppository in the rectum before bedtime for 10 days for internal hemorrhoids. Repeat as needed. (Patient taking differently: as needed for hemorrhoids or anal itching. Insert 1 suppository in the rectum before bedtime for 10 days for internal hemorrhoids. Repeat as needed.) 10 suppository 6   irbesartan (AVAPRO) 75 MG tablet Take 0.5 tablets (37.5 mg total) by mouth daily. 45 tablet 2   isosorbide mononitrate (IMDUR) 30 MG 24 hr tablet Take 1 tablet by mouth daily.     metFORMIN (GLUCOPHAGE-XR) 500 MG 24 hr tablet Take 1,000 mg by mouth 2 (two) times daily.     Multiple Vitamin (MULTIVITAMIN) tablet Take 1 tablet by mouth daily. CENTRUM SILVER     ONETOUCH DELICA LANCETS 33G MISC      potassium chloride 20 MEQ TBCR Take 20 mEq by mouth daily. 30 tablet 0   No current facility-administered medications for this visit.    Allergies:   Ciprofloxacin, Ace inhibitors, Cedax [ceftibuten], Lipitor [atorvastatin], Plavix [clopidogrel bisulfate], and Statins   Social History: Social History   Socioeconomic History   Marital status: Divorced    Spouse name: Not on file   Number of children: 5   Years of education: Not  on file   Highest education level: Not on file  Occupational History   Occupation: Retired  Tobacco Use   Smoking status: Former    Types: Cigarettes    Quit date: 01/17/1951    Years since quitting: 70.6   Smokeless tobacco: Never  Vaping Use   Vaping Use: Never used  Substance and Sexual Activity   Alcohol use: Yes    Comment: Occasional glass of wine   Drug use: No   Sexual activity: Not on file  Other Topics Concern   Not on file  Social History Narrative   Not on file   Social Determinants of Health   Financial Resource Strain: Not on file  Food Insecurity: Not on file  Transportation Needs: Not on  file  Physical Activity: Not on file  Stress: Not on file  Social Connections: Not on file  Intimate Partner Violence: Not on file    Family History: Family History  Problem Relation Age of Onset   Diabetes Mother    Heart attack Father    Stroke Father    Hypertension Father    Kidney disease Daughter      Review of Systems: All other systems reviewed and are otherwise negative except as noted above.  Physical Exam: Vitals:   09/05/21 0828  BP: 126/70  Pulse: 72  SpO2: 97%  Weight: 153 lb (69.4 kg)  Height: 4\' 11"  (1.499 m)     GEN- The patient is well appearing, alert and oriented x 3 today.   HEENT: normocephalic, atraumatic; sclera clear, conjunctiva pink; hearing intact; oropharynx clear; neck supple  Lungs- Clear to ausculation bilaterally, normal work of breathing.  No wheezes, rales, rhonchi Heart- Regular rate and rhythm, no murmurs, rubs or gallops  GI- soft, non-tender, non-distended, bowel sounds present  Extremities- no clubbing or cyanosis. No edema MS- no significant deformity or atrophy Skin- warm and dry, no rash or lesion; PPM pocket well healed Psych- euthymic mood, full affect Neuro- strength and sensation are intact  PPM Interrogation- reviewed in detail today,  See PACEART report  EKG:  EKG is ordered today. Personal review of ekg ordered today shows aflutter with V pacing at 72   Recent Labs: No results found for requested labs within last 365 days.   Wt Readings from Last 3 Encounters:  09/05/21 153 lb (69.4 kg)  06/22/20 158 lb 3.2 oz (71.8 kg)  05/18/20 164 lb 6.4 oz (74.6 kg)     Other studies Reviewed: Additional studies/ records that were reviewed today include: Previous EP office notes, Previous remote checks, Most recent labwork.   Assessment and Plan:  1. CHB s/p St. Jude PPM  Normal PPM function See Pace Art report Cap Confirm was not recommended today. RV capture 2.0V @ 1.0 ms bipolar and unipolar. Fixed amplitude at  4.0V @ 1.0 ms did unfortunately decrease battery life by about 4-6 months, but Cap Confirm/Autocapture was not recommended in bipolar or unipolar settings.  Discussed possibility of needing lead revision at time of gen change.   2. Left sided atrial flutter Continue Eliquis for CHA2DS2VASc of at least 5. Increased burden since May. Asymptomatic. Prefers to avoid procedures if possible.   3. HTN Stable on current regimen    Current medicines are reviewed at length with the patient today.    Labs/ tests ordered today include:  Orders Placed This Encounter  Procedures   Basic metabolic panel   CBC   EKG 12-Lead   Disposition:   Follow  up with Dr. Graciela Husbands in 6 months  Signed, Luane School  09/05/2021 10:43 AM  [facility] [address] [place] [address] [phone] (office) [phone] (fax)

## 2021-09-02 ENCOUNTER — Other Ambulatory Visit: Payer: Self-pay | Admitting: Internal Medicine

## 2021-09-05 ENCOUNTER — Ambulatory Visit: Payer: Medicare Other | Attending: Student | Admitting: Student

## 2021-09-05 ENCOUNTER — Encounter: Payer: Self-pay | Admitting: Student

## 2021-09-05 VITALS — BP 126/70 | HR 72 | Ht 59.0 in | Wt 153.0 lb

## 2021-09-05 DIAGNOSIS — I4892 Unspecified atrial flutter: Secondary | ICD-10-CM

## 2021-09-05 DIAGNOSIS — I442 Atrioventricular block, complete: Secondary | ICD-10-CM

## 2021-09-05 DIAGNOSIS — Z950 Presence of cardiac pacemaker: Secondary | ICD-10-CM | POA: Diagnosis not present

## 2021-09-05 LAB — CUP PACEART INCLINIC DEVICE CHECK
Battery Remaining Longevity: 9 mo
Battery Voltage: 2.89 V
Brady Statistic RA Percent Paced: 77 %
Brady Statistic RV Percent Paced: 99.97 %
Date Time Interrogation Session: 20230828104337
Implantable Lead Implant Date: 20061229
Implantable Lead Implant Date: 20061229
Implantable Lead Location: 753859
Implantable Lead Location: 753860
Implantable Lead Model: 5076
Implantable Pulse Generator Implant Date: 20170329
Lead Channel Impedance Value: 375 Ohm
Lead Channel Impedance Value: 762.5 Ohm
Lead Channel Pacing Threshold Amplitude: 1.75 V
Lead Channel Pacing Threshold Amplitude: 1.75 V
Lead Channel Pacing Threshold Pulse Width: 1 ms
Lead Channel Pacing Threshold Pulse Width: 1 ms
Lead Channel Sensing Intrinsic Amplitude: 1.3 mV
Lead Channel Sensing Intrinsic Amplitude: 12 mV
Lead Channel Setting Pacing Amplitude: 2 V
Lead Channel Setting Pacing Amplitude: 4 V
Lead Channel Setting Pacing Pulse Width: 1 ms
Lead Channel Setting Sensing Sensitivity: 6 mV
Pulse Gen Model: 2240
Pulse Gen Serial Number: 7892876

## 2021-09-05 LAB — CBC

## 2021-09-05 NOTE — Patient Instructions (Signed)
Medication Instructions:  Your physician recommends that you continue on your current medications as directed. Please refer to the Current Medication list given to you today.  *If you need a refill on your cardiac medications before your next appointment, please call your pharmacy*   Lab Work: TODAY: BMET, CBC  If you have labs (blood work) drawn today and your tests are completely normal, you will receive your results only by: MyChart Message (if you have MyChart) OR A paper copy in the mail If you have any lab test that is abnormal or we need to change your treatment, we will call you to review the results.   Follow-Up: At [facility], you and your health needs are our priority.  As part of our continuing mission to provide you with exceptional heart care, we have created designated Provider Care Teams.  These Care Teams include your primary Cardiologist (physician) and Advanced Practice Providers (APPs -  Physician Assistants and Nurse Practitioners) who all work together to provide you with the care you need, when you need it.  Your next appointment:   6 month(s)  The format for your next appointment:   In Person  Provider:   Sherryl Manges, MD

## 2021-09-06 LAB — CBC
Hematocrit: 37.5 % (ref 34.0–46.6)
Hemoglobin: 11.9 g/dL (ref 11.1–15.9)
MCH: 29.1 pg (ref 26.6–33.0)
MCHC: 31.7 g/dL (ref 31.5–35.7)
MCV: 92 fL (ref 79–97)
Platelets: 257 10*3/uL (ref 150–450)
RBC: 4.09 x10E6/uL (ref 3.77–5.28)
RDW: 13.1 % (ref 11.7–15.4)
WBC: 7 10*3/uL (ref 3.4–10.8)

## 2021-09-06 LAB — BASIC METABOLIC PANEL
BUN/Creatinine Ratio: 19 (ref 12–28)
BUN: 14 mg/dL (ref 10–36)
CO2: 27 mmol/L (ref 20–29)
Calcium: 11.1 mg/dL — ABNORMAL HIGH (ref 8.7–10.3)
Chloride: 105 mmol/L (ref 96–106)
Creatinine, Ser: 0.73 mg/dL (ref 0.57–1.00)
Glucose: 124 mg/dL — ABNORMAL HIGH (ref 70–99)
Potassium: 4.3 mmol/L (ref 3.5–5.2)
Sodium: 144 mmol/L (ref 134–144)
eGFR: 77 mL/min/{1.73_m2} (ref >59)

## 2021-09-23 ENCOUNTER — Other Ambulatory Visit: Payer: Self-pay | Admitting: Internal Medicine

## 2021-09-28 ENCOUNTER — Ambulatory Visit (INDEPENDENT_AMBULATORY_CARE_PROVIDER_SITE_OTHER): Payer: Medicare Other

## 2021-09-28 DIAGNOSIS — I442 Atrioventricular block, complete: Secondary | ICD-10-CM

## 2021-09-28 LAB — CUP PACEART REMOTE DEVICE CHECK
Battery Remaining Longevity: 9 mo
Battery Remaining Percentage: 15 %
Battery Voltage: 2.86 V
Brady Statistic AP VP Percent: 0 %
Brady Statistic AP VS Percent: 0 %
Brady Statistic AS VP Percent: 100 %
Brady Statistic AS VS Percent: 0 %
Brady Statistic RA Percent Paced: 1 %
Brady Statistic RV Percent Paced: 99 %
Date Time Interrogation Session: 20230920020014
Implantable Lead Implant Date: 20061229
Implantable Lead Implant Date: 20061229
Implantable Lead Location: 753859
Implantable Lead Location: 753860
Implantable Lead Model: 5076
Implantable Pulse Generator Implant Date: 20170329
Lead Channel Impedance Value: 390 Ohm
Lead Channel Impedance Value: 750 Ohm
Lead Channel Pacing Threshold Amplitude: 1 V
Lead Channel Pacing Threshold Amplitude: 1.75 V
Lead Channel Pacing Threshold Pulse Width: 0.5 ms
Lead Channel Pacing Threshold Pulse Width: 1 ms
Lead Channel Sensing Intrinsic Amplitude: 1.2 mV
Lead Channel Sensing Intrinsic Amplitude: 12 mV
Lead Channel Setting Pacing Amplitude: 2 V
Lead Channel Setting Pacing Amplitude: 4 V
Lead Channel Setting Pacing Pulse Width: 1 ms
Lead Channel Setting Sensing Sensitivity: 6 mV
Pulse Gen Model: 2240
Pulse Gen Serial Number: 7892876

## 2021-10-03 ENCOUNTER — Telehealth: Payer: Self-pay

## 2021-10-03 NOTE — Telephone Encounter (Signed)
Will increase at 6 months per protocol.

## 2021-10-03 NOTE — Telephone Encounter (Signed)
-----   Message from Deboraha Sprang, MD sent at 09/30/2021  4:43 PM EDT ----- Remote reviewed. This remote is abnormal for Battery approaching Elective Replacement Indicator

## 2021-10-11 NOTE — Progress Notes (Signed)
Remote pacemaker transmission.   

## 2021-12-28 ENCOUNTER — Ambulatory Visit: Payer: Medicare Other

## 2021-12-28 LAB — CUP PACEART REMOTE DEVICE CHECK
Battery Remaining Longevity: 6 mo
Battery Remaining Percentage: 11 %
Battery Voltage: 2.83 V
Brady Statistic AP VP Percent: 33 %
Brady Statistic AP VS Percent: 1 %
Brady Statistic AS VP Percent: 67 %
Brady Statistic AS VS Percent: 1 %
Brady Statistic RA Percent Paced: 1 %
Brady Statistic RV Percent Paced: 99 %
Date Time Interrogation Session: 20231220023744
Implantable Lead Connection Status: 753985
Implantable Lead Connection Status: 753985
Implantable Lead Implant Date: 20061229
Implantable Lead Implant Date: 20061229
Implantable Lead Location: 753859
Implantable Lead Location: 753860
Implantable Lead Model: 5076
Implantable Pulse Generator Implant Date: 20170329
Lead Channel Impedance Value: 380 Ohm
Lead Channel Impedance Value: 740 Ohm
Lead Channel Pacing Threshold Amplitude: 1 V
Lead Channel Pacing Threshold Amplitude: 1.75 V
Lead Channel Pacing Threshold Pulse Width: 0.5 ms
Lead Channel Pacing Threshold Pulse Width: 1 ms
Lead Channel Sensing Intrinsic Amplitude: 0.8 mV
Lead Channel Sensing Intrinsic Amplitude: 12 mV
Lead Channel Setting Pacing Amplitude: 2 V
Lead Channel Setting Pacing Amplitude: 4 V
Lead Channel Setting Pacing Pulse Width: 1 ms
Lead Channel Setting Sensing Sensitivity: 6 mV
Pulse Gen Model: 2240
Pulse Gen Serial Number: 7892876

## 2021-12-29 ENCOUNTER — Telehealth: Payer: Self-pay

## 2021-12-29 NOTE — Telephone Encounter (Signed)
LMOVM for patient to call the office to discuss battery status. I already scheduled monthly battery checks. Please make patient aware.

## 2021-12-29 NOTE — Telephone Encounter (Signed)
Received the following routine transmission:  Scheduled remote reviewed. Normal device function.   AF/flutter, controlled rates Persistent AF,  burden >99%, Eliquis.  Clinic visit 8/28, pt. asymptomatic, prefers to avoid procedures Battery estimate 6.45mo - route to triage Next remote to be determined, currently scheduled 03/29/2022 [place]   Needs to be contacted about setting up on monthly remote schedule now at on battery.  Will route to device CMA team to assist.  Thanks.  Also due to see Dr. Graciela Husbands in February.  Please assist with scheduling as well.

## 2022-01-03 NOTE — Telephone Encounter (Signed)
Lmovm for patient to return my call.

## 2022-01-04 NOTE — Telephone Encounter (Signed)
I spoke with the patient daughter and let her know that she has 6 months left on the battery. We are starting monthly battery checks and the next one is 01/30/2022.

## 2022-01-30 ENCOUNTER — Ambulatory Visit: Payer: Medicare Other | Attending: Internal Medicine

## 2022-01-30 DIAGNOSIS — I442 Atrioventricular block, complete: Secondary | ICD-10-CM

## 2022-01-31 LAB — CUP PACEART REMOTE DEVICE CHECK
Battery Remaining Longevity: 5 mo
Battery Remaining Percentage: 9 %
Battery Voltage: 2.81 V
Brady Statistic AP VP Percent: 42 %
Brady Statistic AP VS Percent: 1 %
Brady Statistic AS VP Percent: 58 %
Brady Statistic AS VS Percent: 1 %
Brady Statistic RA Percent Paced: 1 %
Brady Statistic RV Percent Paced: 99 %
Date Time Interrogation Session: 20240122020013
Implantable Lead Connection Status: 753985
Implantable Lead Connection Status: 753985
Implantable Lead Implant Date: 20061229
Implantable Lead Implant Date: 20061229
Implantable Lead Location: 753859
Implantable Lead Location: 753860
Implantable Lead Model: 5076
Implantable Pulse Generator Implant Date: 20170329
Lead Channel Impedance Value: 380 Ohm
Lead Channel Impedance Value: 740 Ohm
Lead Channel Pacing Threshold Amplitude: 1 V
Lead Channel Pacing Threshold Amplitude: 1.75 V
Lead Channel Pacing Threshold Pulse Width: 0.5 ms
Lead Channel Pacing Threshold Pulse Width: 1 ms
Lead Channel Sensing Intrinsic Amplitude: 0.7 mV
Lead Channel Sensing Intrinsic Amplitude: 12 mV
Lead Channel Setting Pacing Amplitude: 2 V
Lead Channel Setting Pacing Amplitude: 4 V
Lead Channel Setting Pacing Pulse Width: 1 ms
Lead Channel Setting Sensing Sensitivity: 6 mV
Pulse Gen Model: 2240
Pulse Gen Serial Number: 7892876

## 2022-02-04 ENCOUNTER — Emergency Department (HOSPITAL_COMMUNITY)
Admission: EM | Admit: 2022-02-04 | Discharge: 2022-02-04 | Disposition: A | Discharge status: Home / Self Care | Payer: Medicare Other | Attending: Emergency Medicine | Admitting: Emergency Medicine

## 2022-02-04 DIAGNOSIS — I110 Hypertensive heart disease with heart failure: Secondary | ICD-10-CM | POA: Insufficient documentation

## 2022-02-04 DIAGNOSIS — Z7901 Long term (current) use of anticoagulants: Secondary | ICD-10-CM | POA: Insufficient documentation

## 2022-02-04 DIAGNOSIS — Z79899 Other long term (current) drug therapy: Secondary | ICD-10-CM | POA: Diagnosis not present

## 2022-02-04 DIAGNOSIS — Z950 Presence of cardiac pacemaker: Secondary | ICD-10-CM | POA: Insufficient documentation

## 2022-02-04 DIAGNOSIS — I959 Hypotension, unspecified: Secondary | ICD-10-CM | POA: Diagnosis present

## 2022-02-04 DIAGNOSIS — I4892 Unspecified atrial flutter: Secondary | ICD-10-CM | POA: Insufficient documentation

## 2022-02-04 DIAGNOSIS — I509 Heart failure, unspecified: Secondary | ICD-10-CM | POA: Insufficient documentation

## 2022-02-04 LAB — CBC WITH DIFFERENTIAL/PLATELET
Abs Immature Granulocytes: 0.03 10*3/uL (ref 0.00–0.07)
Basophils Absolute: 0 10*3/uL (ref 0.0–0.1)
Basophils Relative: 1 %
Eosinophils Absolute: 0.2 10*3/uL (ref 0.0–0.5)
Eosinophils Relative: 3 %
HCT: 35.1 % — ABNORMAL LOW (ref 36.0–46.0)
Hemoglobin: 11.7 g/dL — ABNORMAL LOW (ref 12.0–15.0)
Immature Granulocytes: 1 %
Lymphocytes Relative: 25 %
Lymphs Abs: 1.6 10*3/uL (ref 0.7–4.0)
MCH: 30.2 pg (ref 26.0–34.0)
MCHC: 33.3 g/dL (ref 30.0–36.0)
MCV: 90.5 fL (ref 80.0–100.0)
Monocytes Absolute: 0.7 10*3/uL (ref 0.1–1.0)
Monocytes Relative: 11 %
Neutro Abs: 3.9 10*3/uL (ref 1.7–7.7)
Neutrophils Relative %: 59 %
Platelets: 156 10*3/uL (ref 150–400)
RBC: 3.88 MIL/uL (ref 3.87–5.11)
RDW: 14.6 % (ref 11.5–15.5)
WBC: 6.5 10*3/uL (ref 4.0–10.5)
nRBC: 0 % (ref 0.0–0.2)

## 2022-02-04 LAB — BASIC METABOLIC PANEL
Anion gap: 7 (ref 5–15)
BUN: 14 mg/dL (ref 8–23)
CO2: 26 mmol/L (ref 22–32)
Calcium: 10.4 mg/dL — ABNORMAL HIGH (ref 8.9–10.3)
Chloride: 106 mmol/L (ref 98–111)
Creatinine, Ser: 0.82 mg/dL (ref 0.44–1.00)
GFR, Estimated: >60 mL/min (ref >60)
Glucose, Bld: 108 mg/dL — ABNORMAL HIGH (ref 70–99)
Potassium: 4.2 mmol/L (ref 3.5–5.1)
Sodium: 139 mmol/L (ref 135–145)

## 2022-02-04 NOTE — ED Provider Notes (Signed)
[facility] Provider Note   CSN: [id number] Arrival date & time: 02/04/22  1654     History {Add pertinent medical, surgical, social history, OB history to HPI:1} Chief Complaint  Patient presents with   Wellness Visit    Brittany [name] is a 89 y.o. female.  HPI     Home Medications Prior to Admission medications   Medication Sig Start Date End Date Taking? Authorizing Provider  acetaminophen (TYLENOL) 325 MG tablet Take 650 mg by mouth as needed.    [provider]  carvedilol (COREG) 25 MG tablet TAKE 1/2 TABLET TWICE A DAY 03/12/17   Deboraha Sprang, MD  cyanocobalamin 1000 MCG tablet Take by mouth daily.    [provider]  docusate sodium (COLACE) 100 MG capsule Take 100 mg by mouth daily.    [provider]  ELIQUIS 5 MG TABS tablet TAKE 1 TABLET TWICE A DAY 09/05/17   Deboraha Sprang, MD  furosemide (LASIX) 80 MG tablet Take 1 tablet (80 mg total) by mouth daily. 09/23/21   Deboraha Sprang, MD  glucose blood test strip USE FOR TESTING ONCE DAILY 08/07/16   [provider]  hydrocortisone (ANUSOL-HC) 25 MG suppository Insert 1 suppository in the rectum before bedtime for 10 days for internal hemorrhoids. Repeat as needed. Patient taking differently: as needed for hemorrhoids or anal itching. Insert 1 suppository in the rectum before bedtime for 10 days for internal hemorrhoids. Repeat as needed. 08/09/17   Esterwood, Amy S, PA-C  irbesartan (AVAPRO) 75 MG tablet Take 0.5 tablets (37.5 mg total) by mouth daily. 04/12/17   Chanetta Marshall K, NP  isosorbide mononitrate (IMDUR) 30 MG 24 hr tablet Take 1 tablet by mouth daily. 09/04/18   [provider]  metFORMIN (GLUCOPHAGE-XR) 500 MG 24 hr tablet Take 1,000 mg by mouth 2 (two) times daily. 12/10/14   [provider]  Multiple Vitamin (MULTIVITAMIN) tablet Take 1 tablet by mouth daily. CENTRUM SILVER    [provider]  Jonetta Speak LANCETS [address] [place]  11/06/16   [provider]  potassium chloride 20 MEQ TBCR Take 20 mEq by mouth daily. 01/05/15   Thurnell Lose, MD      Allergies    Ciprofloxacin, Ace inhibitors, Cedax [ceftibuten], Lipitor [atorvastatin], Plavix [clopidogrel bisulfate], and Statins    Review of Systems   Review of Systems  Physical Exam Updated Vital Signs BP 111/79 (BP Location: Right Arm)   Pulse 73   Temp 97.9 F (36.6 C) (Oral)   Resp 18   SpO2 99%  Physical Exam  ED Results / Procedures / Treatments   Labs (all labs ordered are listed, but only abnormal results are displayed) Labs Reviewed  BASIC METABOLIC PANEL - Abnormal; Notable for the following components:      Result Value   Glucose, Bld 108 (*)    Calcium 10.4 (*)    All other components within normal limits  CBC WITH DIFFERENTIAL/PLATELET - Abnormal; Notable for the following components:   Hemoglobin 11.7 (*)    HCT 35.1 (*)    All other components within normal limits  CBC WITH DIFFERENTIAL/PLATELET    EKG None  Radiology No results found.  Procedures Procedures  {Document cardiac monitor, telemetry assessment procedure when appropriate:1}  Medications Ordered in ED Medications - No data to display  ED Course/ Medical Decision Making/ A&P   {   Click here for ABCD2, HEART and other  calculatorsREFRESH Note before signing :1}                          Medical Decision Making  ***  {Document critical care time when appropriate:1} {Document review of labs and clinical decision tools ie heart score, Chads2Vasc2 etc:1}  {Document your independent review of radiology images, and any outside records:1} {Document your discussion with family members, caretakers, and with consultants:1} {Document social determinants of health affecting pt's care:1} {Document your decision making why or why not admission, treatments were needed:1} Final Clinical Impression(s) / ED Diagnoses Final diagnoses:   None    Rx / DC Orders ED Discharge Orders     None

## 2022-02-04 NOTE — ED Triage Notes (Signed)
Patient states she is unsure of why she is here and has no complaints. Notes indicate patient was seen at an Urgent Care in [facility] earlier today for evaluation of low blood pressure measurements at home. Patient was normotensive at urgent care. Patient is alert, oriented, has no complaints, and is in no apparent distress.

## 2022-02-04 NOTE — ED Provider Triage Note (Signed)
Emergency Medicine Provider Triage Evaluation Note  Brittany [name] , a 89 y.o. female  was evaluated in triage.  Pt's daughter reports pt's blood pressure was low at home.  Pt weaker and did not eat one day earlier in the week .  Review of Systems  Positive: weakness Negative: fever  Physical Exam  BP 101/87   Pulse 70   Temp 98.3 F (36.8 C)   Resp 16   SpO2 97%  Gen:   Awake, no distress   Resp:  Normal effort  MSK:   Moves extremities without difficulty  Other:    Medical Decision Making  Medically screening exam initiated at 5:08 PM.  Appropriate orders placed.  Dorna Leitz was informed that the remainder of the evaluation will be completed by another provider, this initial triage assessment does not replace that evaluation, and the importance of remaining in the ED until their evaluation is complete.     Fransico Meadow, [place] 02/04/22 1730

## 2022-02-04 NOTE — ED Notes (Signed)
Pt in bed, daughter at bedside, pt denies pain, pt offers no complaints, daughter states that they are here because she has been checking her blood pressure at home and occasionally gets some low readings.  Moving all extremities, positive sensation to touch, equal strength bilaterally

## 2022-02-04 NOTE — Discharge Instructions (Signed)
You were seen in the emergency department today for your concern of hypotension while in the emergency department monitor your blood pressure maintaining normal limits we checked your blood counts which were within normal limits your electrolytes are also within normal limits see attached sheet for concerning signs for hypotension however briefly if you notice that your mother is hearing sweaty, has episodes of passing out or is complaining of chest pain please return her to the emergency department soon as possible.

## 2022-02-20 ENCOUNTER — Ambulatory Visit: Payer: Medicare Other | Attending: Internal Medicine | Admitting: Internal Medicine

## 2022-02-20 ENCOUNTER — Encounter: Payer: Self-pay | Admitting: Internal Medicine

## 2022-02-20 VITALS — BP 100/62 | HR 73 | Ht 59.0 in | Wt 153.0 lb

## 2022-02-20 DIAGNOSIS — Z950 Presence of cardiac pacemaker: Secondary | ICD-10-CM | POA: Diagnosis not present

## 2022-02-20 DIAGNOSIS — I442 Atrioventricular block, complete: Secondary | ICD-10-CM

## 2022-02-20 LAB — CUP PACEART INCLINIC DEVICE CHECK
Battery Remaining Longevity: 5 mo
Brady Statistic RA Percent Paced: 1 % — CL
Brady Statistic RV Percent Paced: 99 %
Date Time Interrogation Session: 20240212193334
Implantable Lead Connection Status: 753985
Implantable Lead Connection Status: 753985
Implantable Lead Implant Date: 20061229
Implantable Lead Implant Date: 20061229
Implantable Lead Location: 753859
Implantable Lead Location: 753860
Implantable Lead Model: 5076
Implantable Pulse Generator Implant Date: 20170329
Lead Channel Impedance Value: 380 Ohm
Lead Channel Impedance Value: 750 Ohm
Lead Channel Pacing Threshold Amplitude: 2 V
Lead Channel Pacing Threshold Pulse Width: 0.5 ms
Lead Channel Sensing Intrinsic Amplitude: 1.1 mV
Pulse Gen Model: 2240
Pulse Gen Serial Number: 7892876

## 2022-02-20 NOTE — Progress Notes (Signed)
Patient Care Team: Bernerd Limbo, MD as PCP - General (Family Medicine) Deboraha Sprang, MD as PCP - Electrophysiology (Cardiology) Deboraha Sprang, MD (Cardiology)   HPI  Brittany [name] is a 89 y.o. female is seen in followup for complete heart block; she is status post pacemaker implantation and  underwent generator replacement 2017    She has a history of prior LAD POBA with normal left ventricular function.    She has a history of atrial flutter flutter that appeared to be typical. Entrainment mapping, however, demonstrated a left atrial flutter and she was there  cardioverted. She is on anticoagulation w apixoban.  no bleeding  Denies chest pain.  Nocturnal dyspnea orthopnea.  No palpitations lightheadedness.  No falls or syncope.  She is able to transfer her on her own.      Date Cr K Hgb LDL  11/19 0.71 3.9 11.4 83 (5/18)   12/20 (CE)  0.69 4.2 12.5    3/22 (CE)  0.72 3.8 11.4   1/24 0.82 4.2 11.7      Echocardiogram 12/16 demonstrated normal LV function and moderate-severe LAE (45/22.2/45)  Past Medical History:  Diagnosis Date   Atrial flutter (HCC)    CAD (coronary artery disease)    CHF (congestive heart failure) (HCC)    Complete heart block ([place])    a. s/p STJ dual chamber PPM   Depression    Glaucoma    Hyperlipidemia    Hypertension    Type II diabetes mellitus ([place])     Past Surgical History:  Procedure Laterality Date   BREAST BIOPSY  1991   ELECTROPHYSIOLOGIC STUDY N/A 02/12/2015   cardioversion by Dr Caryl Comes    EP IMPLANTABLE DEVICE N/A 04/07/2015   a. STJ dual chamber PPM implanted 2006 for complete heart block b. gen change 2017   FEMUR IM NAIL  01/18/2012   Procedure: INTRAMEDULLARY (IM) NAIL FEMORAL;  Surgeon: Marin Shutter, MD;  Location: [facility];  Service: Orthopedics;  Laterality: Left;   left total knee replacement Bilateral 1996   TONSILLECTOMY  1937    Current Outpatient Medications  Medication Sig Dispense Refill    acetaminophen (TYLENOL) 325 MG tablet Take 650 mg by mouth as needed.     carvedilol (COREG) 25 MG tablet TAKE 1/2 TABLET TWICE A DAY 30 tablet 10   cyanocobalamin 1000 MCG tablet Take by mouth daily.     docusate sodium (COLACE) 100 MG capsule Take 100 mg by mouth daily.     ELIQUIS 5 MG TABS tablet TAKE 1 TABLET TWICE A DAY 60 tablet 6   furosemide (LASIX) 80 MG tablet Take 1 tablet (80 mg total) by mouth daily. 90 tablet 3   glucose blood test strip USE FOR TESTING ONCE DAILY     hydrocortisone (ANUSOL-HC) 25 MG suppository Insert 1 suppository in the rectum before bedtime for 10 days for internal hemorrhoids. Repeat as needed. (Patient taking differently: as needed for hemorrhoids or anal itching. Insert 1 suppository in the rectum before bedtime for 10 days for internal hemorrhoids. Repeat as needed.) 10 suppository 6   irbesartan (AVAPRO) 75 MG tablet Take 0.5 tablets (37.5 mg total) by mouth daily. 45 tablet 2   isosorbide mononitrate (IMDUR) 30 MG 24 hr tablet Take 1 tablet by mouth daily.     metFORMIN (GLUCOPHAGE-XR) 500 MG 24 hr tablet Take 1,000 mg by mouth 2 (two) times daily.     Multiple Vitamin (MULTIVITAMIN) tablet Take  1 tablet by mouth daily. CENTRUM SILVER     ONETOUCH DELICA LANCETS [id number] MISC      potassium chloride 20 MEQ TBCR Take 20 mEq by mouth daily. 30 tablet 0   No current facility-administered medications for this visit.    Allergies  Allergen Reactions   Ciprofloxacin Other (See Comments)    Severe chest pains   Ace Inhibitors Other (See Comments)    REACTION: cough and sore throat   Cedax [Ceftibuten] Other (See Comments)    Possibly nausea/vomiting, cyanosis, severe headaches   Lipitor [Atorvastatin] Other (See Comments)    REACTION: Myalgia   Plavix [Clopidogrel Bisulfate] Other (See Comments)    Possibly nausea/vomiting, cyanosis, severe headaches     Statins Other (See Comments)    Myalgias, cyanosis    Review of Systems negative except from HPI  and PMH  Physical Exam BP 100/62   Pulse 73   Ht 4' 11"$  (1.499 m)   Wt 153 lb (69.4 kg)   SpO2 97%   BMI 30.90 kg/m  Well developed and well nourished in no acute distress sitting in wheelchair HENT normal Neck supple with JVP-flat Clear Device pocket well healed; without hematoma or erythema.  There is no tethering  Regular rate and rhythm, no  gallop No  murmur Abd-soft with active BS No Clubbing cyanosis tr edema Skin-warm and dry A & Oriented  Grossly normal sensory and motor function  ECG atrial tachycardia at about 200 bpm with complete heart block  Device function is normal. Programming changes changed from [id number]  See Paceart for details    Assessment and  Plan  Hypertension    HFpEF  Complete heart block  Pacemaker-[employer]    Coronary disease with prior LAD POBA    Atrial flutter/tachycardia  Blood pressures have been increasingly low.  Her carvedilol has been decreased to once a day, we will stop it.  If the blood pressure systolic remains less than 130, I would have her stop her irbesartan as I think the greatest risk to her health is a fall and while she has not had that, her blood pressure, validated in the ER, is running very low.  Her device is approaching ERI.  When it comes time to change her battery, we will anticipate [employer] as her required LV lead output is high.  Would also anticipate in Aegis pouch as this would be her third device  Continue her on Apixaban-- no bleeding dosed for renal function and weight  No angina

## 2022-02-20 NOTE — Patient Instructions (Addendum)
Medication Instructions:  Your physician has recommended you make the following change in your medication:   **  Stop Carvedilol  ** If blood pressure remains below 130(systolic) - stop Irbesartan in 2 weeks.  *If you need a refill on your cardiac medications before your next appointment, please call your pharmacy*   Lab Work: None ordered.  If you have labs (blood work) drawn today and your tests are completely normal, you will receive your results only by: [place] (if you have MyChart) OR A paper copy in the mail If you have any lab test that is abnormal or we need to change your treatment, we will call you to review the results.   Testing/Procedures: None ordered.    Follow-Up: At [facility], you and your health needs are our priority.  As part of our continuing mission to provide you with exceptional heart care, we have created designated Provider Care Teams.  These Care Teams include your primary Cardiologist (physician) and Advanced Practice Providers (APPs -  Physician Assistants and Nurse Practitioners) who all work together to provide you with the care you need, when you need it.  We recommend signing up for the patient portal called "MyChart".  Sign up information is provided on this After Visit Summary.  MyChart is used to connect with patients for Virtual Visits (Telemedicine).  Patients are able to view lab/test results, encounter notes, upcoming appointments, etc.  Non-urgent messages can be sent to your provider as well.   To learn more about what you can do with MyChart, go to [email or web address].    Your next appointment:   Your device is nearing need for battery change out - you will have follow up appointments scheduled after this takes place.

## 2022-02-28 ENCOUNTER — Emergency Department (HOSPITAL_COMMUNITY): Payer: Medicare Other

## 2022-02-28 ENCOUNTER — Other Ambulatory Visit: Payer: Self-pay

## 2022-02-28 ENCOUNTER — Inpatient Hospital Stay (HOSPITAL_COMMUNITY)
Admission: EM | Admit: 2022-02-28 | Discharge: 2022-03-02 | DRG: 689 | Disposition: A | Discharge status: Home / Self Care | Payer: Medicare Other | Attending: Family Medicine | Admitting: Family Medicine

## 2022-02-28 ENCOUNTER — Encounter (HOSPITAL_COMMUNITY): Payer: Self-pay

## 2022-02-28 DIAGNOSIS — I10 Essential (primary) hypertension: Secondary | ICD-10-CM

## 2022-02-28 DIAGNOSIS — Z833 Family history of diabetes mellitus: Secondary | ICD-10-CM | POA: Diagnosis not present

## 2022-02-28 DIAGNOSIS — I110 Hypertensive heart disease with heart failure: Secondary | ICD-10-CM | POA: Diagnosis present

## 2022-02-28 DIAGNOSIS — H409 Unspecified glaucoma: Secondary | ICD-10-CM | POA: Diagnosis present

## 2022-02-28 DIAGNOSIS — Z950 Presence of cardiac pacemaker: Secondary | ICD-10-CM

## 2022-02-28 DIAGNOSIS — E876 Hypokalemia: Secondary | ICD-10-CM | POA: Diagnosis present

## 2022-02-28 DIAGNOSIS — E785 Hyperlipidemia, unspecified: Secondary | ICD-10-CM | POA: Diagnosis present

## 2022-02-28 DIAGNOSIS — Z823 Family history of stroke: Secondary | ICD-10-CM

## 2022-02-28 DIAGNOSIS — Z888 Allergy status to other drugs, medicaments and biological substances status: Secondary | ICD-10-CM

## 2022-02-28 DIAGNOSIS — I5032 Chronic diastolic (congestive) heart failure: Secondary | ICD-10-CM | POA: Diagnosis present

## 2022-02-28 DIAGNOSIS — R0602 Shortness of breath: Secondary | ICD-10-CM

## 2022-02-28 DIAGNOSIS — Z7984 Long term (current) use of oral hypoglycemic drugs: Secondary | ICD-10-CM

## 2022-02-28 DIAGNOSIS — Z66 Do not resuscitate: Secondary | ICD-10-CM | POA: Diagnosis present

## 2022-02-28 DIAGNOSIS — F32A Depression, unspecified: Secondary | ICD-10-CM | POA: Diagnosis present

## 2022-02-28 DIAGNOSIS — Z8249 Family history of ischemic heart disease and other diseases of the circulatory system: Secondary | ICD-10-CM | POA: Diagnosis not present

## 2022-02-28 DIAGNOSIS — F05 Delirium due to known physiological condition: Secondary | ICD-10-CM | POA: Diagnosis present

## 2022-02-28 DIAGNOSIS — I2510 Atherosclerotic heart disease of native coronary artery without angina pectoris: Secondary | ICD-10-CM | POA: Diagnosis present

## 2022-02-28 DIAGNOSIS — E860 Dehydration: Secondary | ICD-10-CM | POA: Diagnosis present

## 2022-02-28 DIAGNOSIS — Z1152 Encounter for screening for COVID-19: Secondary | ICD-10-CM | POA: Diagnosis not present

## 2022-02-28 DIAGNOSIS — Z79899 Other long term (current) drug therapy: Secondary | ICD-10-CM

## 2022-02-28 DIAGNOSIS — I442 Atrioventricular block, complete: Secondary | ICD-10-CM | POA: Diagnosis present

## 2022-02-28 DIAGNOSIS — E119 Type 2 diabetes mellitus without complications: Secondary | ICD-10-CM

## 2022-02-28 DIAGNOSIS — Z96653 Presence of artificial knee joint, bilateral: Secondary | ICD-10-CM | POA: Diagnosis present

## 2022-02-28 DIAGNOSIS — Z87891 Personal history of nicotine dependence: Secondary | ICD-10-CM | POA: Diagnosis not present

## 2022-02-28 DIAGNOSIS — I509 Heart failure, unspecified: Secondary | ICD-10-CM

## 2022-02-28 DIAGNOSIS — Z7901 Long term (current) use of anticoagulants: Secondary | ICD-10-CM

## 2022-02-28 DIAGNOSIS — E1169 Type 2 diabetes mellitus with other specified complication: Secondary | ICD-10-CM | POA: Diagnosis not present

## 2022-02-28 DIAGNOSIS — N390 Urinary tract infection, site not specified: Principal | ICD-10-CM | POA: Diagnosis present

## 2022-02-28 DIAGNOSIS — I4892 Unspecified atrial flutter: Secondary | ICD-10-CM | POA: Diagnosis present

## 2022-02-28 DIAGNOSIS — R200 Anesthesia of skin: Secondary | ICD-10-CM

## 2022-02-28 DIAGNOSIS — R000 Tachycardia, unspecified: Secondary | ICD-10-CM | POA: Diagnosis present

## 2022-02-28 DIAGNOSIS — R410 Disorientation, unspecified: Principal | ICD-10-CM

## 2022-02-28 DIAGNOSIS — Z882 Allergy status to sulfonamides status: Secondary | ICD-10-CM

## 2022-02-28 DIAGNOSIS — G9341 Metabolic encephalopathy: Secondary | ICD-10-CM | POA: Diagnosis present

## 2022-02-28 LAB — RESP PANEL BY RT-PCR (RSV, FLU A&B, COVID)  RVPGX2
Influenza A by PCR: NEGATIVE
Influenza B by PCR: NEGATIVE
Resp Syncytial Virus by PCR: NEGATIVE
SARS Coronavirus 2 by RT PCR: NEGATIVE

## 2022-02-28 LAB — PROTIME-INR
INR: 1.9 — ABNORMAL HIGH (ref 0.8–1.2)
Prothrombin Time: 21.6 seconds — ABNORMAL HIGH (ref 11.4–15.2)

## 2022-02-28 LAB — COMPREHENSIVE METABOLIC PANEL
ALT: 26 U/L (ref 0–44)
AST: 31 U/L (ref 15–41)
Albumin: 3.7 g/dL (ref 3.5–5.0)
Alkaline Phosphatase: 72 U/L (ref 38–126)
Anion gap: 8 (ref 5–15)
BUN: 16 mg/dL (ref 8–23)
CO2: 26 mmol/L (ref 22–32)
Calcium: 10.1 mg/dL (ref 8.9–10.3)
Chloride: 103 mmol/L (ref 98–111)
Creatinine, Ser: 0.81 mg/dL (ref 0.44–1.00)
GFR, Estimated: >60 mL/min (ref >60)
Glucose, Bld: 179 mg/dL — ABNORMAL HIGH (ref 70–99)
Potassium: 3.1 mmol/L — ABNORMAL LOW (ref 3.5–5.1)
Sodium: 137 mmol/L (ref 135–145)
Total Bilirubin: 1.1 mg/dL (ref 0.3–1.2)
Total Protein: 7.1 g/dL (ref 6.5–8.1)

## 2022-02-28 LAB — CBC WITH DIFFERENTIAL/PLATELET
Abs Immature Granulocytes: 0.04 10*3/uL (ref 0.00–0.07)
Basophils Absolute: 0 10*3/uL (ref 0.0–0.1)
Basophils Relative: 0 %
Eosinophils Absolute: 0 10*3/uL (ref 0.0–0.5)
Eosinophils Relative: 0 %
HCT: 38.8 % (ref 36.0–46.0)
Hemoglobin: 12.9 g/dL (ref 12.0–15.0)
Immature Granulocytes: 1 %
Lymphocytes Relative: 2 %
Lymphs Abs: 0.2 10*3/uL — ABNORMAL LOW (ref 0.7–4.0)
MCH: 29.6 pg (ref 26.0–34.0)
MCHC: 33.2 g/dL (ref 30.0–36.0)
MCV: 89 fL (ref 80.0–100.0)
Monocytes Absolute: 0 10*3/uL — ABNORMAL LOW (ref 0.1–1.0)
Monocytes Relative: 1 %
Neutro Abs: 7.9 10*3/uL — ABNORMAL HIGH (ref 1.7–7.7)
Neutrophils Relative %: 96 %
Platelets: 157 10*3/uL (ref 150–400)
RBC: 4.36 MIL/uL (ref 3.87–5.11)
RDW: 14.2 % (ref 11.5–15.5)
WBC: 8.2 10*3/uL (ref 4.0–10.5)
nRBC: 0 % (ref 0.0–0.2)

## 2022-02-28 LAB — LACTIC ACID, PLASMA
Lactic Acid, Venous: 2.1 mmol/L (ref 0.5–1.9)
Lactic Acid, Venous: 2.5 mmol/L (ref 0.5–1.9)

## 2022-02-28 LAB — HEMOGLOBIN A1C
Hgb A1c MFr Bld: 5.9 % — ABNORMAL HIGH (ref 4.8–5.6)
Mean Plasma Glucose: 122.63 mg/dL

## 2022-02-28 LAB — URINALYSIS, W/ REFLEX TO CULTURE (INFECTION SUSPECTED)
Bilirubin Urine: NEGATIVE
Glucose, UA: NEGATIVE mg/dL
Ketones, ur: NEGATIVE mg/dL
Nitrite: POSITIVE — AB
Specific Gravity, Urine: 1.025 (ref 1.005–1.030)
pH: 6 (ref 5.0–8.0)

## 2022-02-28 LAB — APTT: aPTT: 33 seconds (ref 24–36)

## 2022-02-28 LAB — CBG MONITORING, ED: Glucose-Capillary: 167 mg/dL — ABNORMAL HIGH (ref 70–99)

## 2022-02-28 LAB — GLUCOSE, CAPILLARY
Glucose-Capillary: 130 mg/dL — ABNORMAL HIGH (ref 70–99)
Glucose-Capillary: 101 mg/dL — ABNORMAL HIGH (ref 70–99)
Glucose-Capillary: 88 mg/dL (ref 70–99)

## 2022-02-28 MED ORDER — INSULIN ASPART 100 UNIT/ML IJ SOLN: 0.0000 [IU] | Freq: Every day | INTRAMUSCULAR | Status: DC

## 2022-02-28 MED ORDER — ONDANSETRON HCL 4 MG PO TABS: 4.0000 mg | ORAL_TABLET | Freq: Four times a day (QID) | ORAL | Status: DC | PRN

## 2022-02-28 MED ORDER — APIXABAN 5 MG PO TABS
5.0000 mg | ORAL_TABLET | Freq: Two times a day (BID) | ORAL | Status: DC
  Administered 2022-02-28 – 2022-03-02 (×5): 5 mg via ORAL
  Filled 2022-02-28 (×5): qty 1

## 2022-02-28 MED ORDER — SODIUM CHLORIDE 0.9 % IV SOLN
2.0000 g | Freq: Once | INTRAVENOUS | Status: AC
  Administered 2022-02-28: 2 g via INTRAVENOUS
  Filled 2022-02-28: qty 20

## 2022-02-28 MED ORDER — ACETAMINOPHEN 650 MG RE SUPP: 650.0000 mg | Freq: Four times a day (QID) | RECTAL | Status: DC | PRN

## 2022-02-28 MED ORDER — ADULT MULTIVITAMIN W/MINERALS CH
1.0000 | ORAL_TABLET | Freq: Every day | ORAL | Status: DC
  Administered 2022-02-28 – 2022-03-02 (×3): 1 via ORAL
  Filled 2022-02-28 (×3): qty 1

## 2022-02-28 MED ORDER — SODIUM CHLORIDE 0.9 % IV SOLN: INTRAVENOUS | Status: AC

## 2022-02-28 MED ORDER — SENNOSIDES-DOCUSATE SODIUM 8.6-50 MG PO TABS
1.0000 | ORAL_TABLET | Freq: Every day | ORAL | Status: DC
  Administered 2022-02-28 – 2022-03-02 (×3): 1 via ORAL
  Filled 2022-02-28 (×3): qty 1

## 2022-02-28 MED ORDER — SODIUM CHLORIDE 0.9 % IV SOLN
1.0000 g | INTRAVENOUS | Status: DC
  Administered 2022-03-01 – 2022-03-02 (×2): 1 g via INTRAVENOUS
  Filled 2022-02-28 (×2): qty 10

## 2022-02-28 MED ORDER — ACETAMINOPHEN 325 MG PO TABS: 650.0000 mg | ORAL_TABLET | Freq: Four times a day (QID) | ORAL | Status: DC | PRN

## 2022-02-28 MED ORDER — INSULIN ASPART 100 UNIT/ML IJ SOLN
0.0000 [IU] | Freq: Three times a day (TID) | INTRAMUSCULAR | Status: DC
  Administered 2022-02-28: 1 [IU] via SUBCUTANEOUS
  Administered 2022-03-01 (×2): 2 [IU] via SUBCUTANEOUS

## 2022-02-28 MED ORDER — LACTATED RINGERS IV BOLUS
1000.0000 mL | Freq: Once | INTRAVENOUS | Status: AC
  Administered 2022-02-28: 1000 mL via INTRAVENOUS

## 2022-02-28 MED ORDER — ONDANSETRON HCL 4 MG/2ML IJ SOLN: 4.0000 mg | Freq: Four times a day (QID) | INTRAMUSCULAR | Status: DC | PRN

## 2022-02-28 MED ORDER — POTASSIUM CHLORIDE CRYS ER 20 MEQ PO TBCR
40.0000 meq | EXTENDED_RELEASE_TABLET | ORAL | Status: AC
  Administered 2022-02-28 (×2): 40 meq via ORAL
  Filled 2022-02-28 (×2): qty 2

## 2022-02-28 NOTE — Evaluation (Signed)
Clinical/Bedside Swallow Evaluation Patient Details  Name: Brittany [name] MRN: [id number] Date of Birth: July 15, 1927  Today's Date: 02/28/2022 Time: SLP Start Time (ACUTE ONLY): [id number] SLP Stop Time (ACUTE ONLY): 1327 SLP Time Calculation (min) (ACUTE ONLY): 22 min  Past Medical History:  Past Medical History:  Diagnosis Date   Atrial flutter ([place])    CAD (coronary artery disease)    CHF (congestive heart failure) (HCC)    Complete heart block ([place])    a. s/p STJ dual chamber PPM   Depression    Glaucoma    Hyperlipidemia    Hypertension    Type II diabetes mellitus ([place])    Past Surgical History:  Past Surgical History:  Procedure Laterality Date   BREAST BIOPSY  1991   ELECTROPHYSIOLOGIC STUDY N/A 02/12/2015   cardioversion by Dr Caryl Comes    EP IMPLANTABLE DEVICE N/A 04/07/2015   a. STJ dual chamber PPM implanted 2006 for complete heart block b. gen change 2017   FEMUR IM NAIL  01/18/2012   Procedure: INTRAMEDULLARY (IM) NAIL FEMORAL;  Surgeon: Marin Shutter, MD;  Location: [facility];  Service: Orthopedics;  Laterality: Left;   left total knee replacement Bilateral 1996   TONSILLECTOMY  1937   HPI:  Brittany [name] is a 89 y.o. female with medical history significant of history of atrial flutter, complete heart block status post pacemaker implantation, chronic diastolic heart failure, hypertension, type 2 diabetes mellitus and hyperlipidemia; presented to the hospital secondary to generalized weakness and altered mental status changes.  Patient's symptoms has presented partially abruptly in the last 24-48 hours prior to admission; demonstrating inability with assistance to go to the bathroom (something that she has normally being able to do on her own), and noticing to be more confused than usual.  Patient denies pain, expressed chills, no nausea, no vomiting, no abdominal pain or focal weaknesses.     Workup in the ED: CT scan without acute intracranial abnormalities; positive  urinalysis suggesting UTI and mild dehydration.  Cultures taken, IV fluids given and antibiotics started.BSE requested.    Assessment / Plan / Recommendation  Clinical Impression  Clinical swallow evaluation completed at bedside. Pt cried out with repositioning and indicated discomfort in her back. Oral motor evaluation reveals edentulous lower and upper partials. No gross asymmetry. Pt assessed with ice chips, thin water via straw sips, puree, and regular textures (graham crackers broken into bite sized pieces). Pt without overt signs or symptoms of reduced airway protection and no reports of globus. She does exhibit belching after trials. Recommend D3/mech soft and thin liquids with PO medications whole or crushed in puree and supervision/feeder assist. Pt would benefit from sitting up in chair for meals due to suspected esophageal component characterized by excessive belching today. No further SLP services indicated at this time. SLP will sign off. SLP Visit Diagnosis: Dysphagia, unspecified (R13.10)    Aspiration Risk  Mild aspiration risk    Diet Recommendation Dysphagia 3 (Mech soft);Thin liquid   Liquid Administration via: Cup;Straw Medication Administration: Whole meds with puree Supervision: Staff to assist with self feeding;Full supervision/cueing for compensatory strategies Compensations: Slow rate;Small sips/bites;Lingual sweep for clearance of pocketing Postural Changes: Seated upright at 90 degrees;Remain upright for at least 30 minutes after po intake    Other  Recommendations Oral Care Recommendations: Oral care BID;Staff/trained caregiver to provide oral care    Recommendations for follow up therapy are one component of a multi-disciplinary discharge planning process, led by the attending physician.  Recommendations may be updated based on patient status, additional functional criteria and insurance authorization.  Follow up Recommendations No SLP follow up      Assistance  Recommended at Discharge    Functional Status Assessment Patient has not had a recent decline in their functional status  Frequency and Duration            Prognosis Prognosis for improved oropharyngeal function: Good      Swallow Study   General Date of Onset: 02/28/22 HPI: Brittany [name] is a 89 y.o. female with medical history significant of history of atrial flutter, complete heart block status post pacemaker implantation, chronic diastolic heart failure, hypertension, type 2 diabetes mellitus and hyperlipidemia; presented to the hospital secondary to generalized weakness and altered mental status changes.  Patient's symptoms has presented partially abruptly in the last 24-48 hours prior to admission; demonstrating inability with assistance to go to the bathroom (something that she has normally being able to do on her own), and noticing to be more confused than usual.  Patient denies pain, expressed chills, no nausea, no vomiting, no abdominal pain or focal weaknesses.     Workup in the ED: CT scan without acute intracranial abnormalities; positive urinalysis suggesting UTI and mild dehydration.  Cultures taken, IV fluids given and antibiotics started.BSE requested. Type of Study: Bedside Swallow Evaluation Diet Prior to this Study: Dysphagia 2 (finely chopped);Thin liquids (Level 0) Temperature Spikes Noted: No Respiratory Status: Room air History of Recent Intubation: No Behavior/Cognition: Alert;Cooperative;Pleasant mood Oral Cavity Assessment: Within Functional Limits Oral Care Completed by SLP: Yes Oral Cavity - Dentition: Dentures, top;Edentulous Vision: Functional for self-feeding Self-Feeding Abilities: Needs assist Patient Positioning: Upright in bed Baseline Vocal Quality: Normal Volitional Cough: Strong Volitional Swallow: Able to elicit    Oral/Motor/Sensory Function Overall Oral Motor/Sensory Function: Within functional limits   Ice Chips Ice chips: Within functional  limits   Thin Liquid Thin Liquid: Within functional limits Presentation: Cup;Self Fed;Straw Other Comments: excessive eructation    Nectar Thick Nectar Thick Liquid: Not tested   Honey Thick Honey Thick Liquid: Not tested   Puree Puree: Within functional limits Presentation: Spoon   Solid     Solid: Impaired Oral Phase Impairments: Impaired mastication Oral Phase Functional Implications: Prolonged oral transit     Thank you,  Genene Churn, [place]  Shiloh Swopes 02/28/2022,1:40 PM

## 2022-02-28 NOTE — Assessment & Plan Note (Signed)
-  Status post pacemaker implantation -Continue patient follow-up with electrophysiology -Last visit 02/20/2022; everything was stable and functioning as intended.

## 2022-02-28 NOTE — Assessment & Plan Note (Signed)
-  Update A1c -Hold oral hypoglycemic agents -Sliding scale insulin has been ordered. -CBG fluctuation.

## 2022-02-28 NOTE — Assessment & Plan Note (Signed)
-  Blood pressure soft currently -Holding oral hypoglycemic agent -Follow-up vital signs and adjust regimen as needed.

## 2022-02-28 NOTE — Assessment & Plan Note (Signed)
-  Rate controlled -Patient with permanent pacemaker -Continue Eliquis for secondary prevention. -Telemetry monitoring has been requested. -Continue to follow electrolytes and keep things stable (goal is for potassium above 4 and magnesium above 2).

## 2022-02-28 NOTE — ED Provider Notes (Signed)
[facility] Provider Note   CSN: [id number] Arrival date & time: 02/28/22  0221     History  Chief Complaint  Patient presents with   Altered Mental Status    Brittany [name] is a 89 y.o. female.  Patient presents to the emergency department from home.  Patient is accompanied by her daughter who cares for her.  Daughter reports that the patient called for her at around 58 PM to get up and go to the bathroom.  She was too weak to get up and seemed more confused than usual.  She comes to the ED by EMS.  EMS has raise concern for possible stroke as they have noticed possible right facial droop.       Home Medications Prior to Admission medications   Medication Sig Start Date End Date Taking? Authorizing Provider  acetaminophen (TYLENOL) 325 MG tablet Take 650 mg by mouth as needed.    [provider]  cyanocobalamin 1000 MCG tablet Take by mouth daily.    [provider]  docusate sodium (COLACE) 100 MG capsule Take 100 mg by mouth daily.    [provider]  ELIQUIS 5 MG TABS tablet TAKE 1 TABLET TWICE A DAY 09/05/17   Deboraha Sprang, MD  furosemide (LASIX) 80 MG tablet Take 1 tablet (80 mg total) by mouth daily. 09/23/21   Deboraha Sprang, MD  glucose blood test strip USE FOR TESTING ONCE DAILY 08/07/16   [provider]  hydrocortisone (ANUSOL-HC) 25 MG suppository Insert 1 suppository in the rectum before bedtime for 10 days for internal hemorrhoids. Repeat as needed. Patient taking differently: as needed for hemorrhoids or anal itching. Insert 1 suppository in the rectum before bedtime for 10 days for internal hemorrhoids. Repeat as needed. 08/09/17   Esterwood, Amy S, PA-C  irbesartan (AVAPRO) 75 MG tablet Take 0.5 tablets (37.5 mg total) by mouth daily. 04/12/17   Chanetta Marshall K, NP  isosorbide mononitrate (IMDUR) 30 MG 24 hr tablet Take 1 tablet by mouth daily. 09/04/18   [provider]   metFORMIN (GLUCOPHAGE-XR) 500 MG 24 hr tablet Take 1,000 mg by mouth 2 (two) times daily. 12/10/14   [provider]  Multiple Vitamin (MULTIVITAMIN) tablet Take 1 tablet by mouth daily. CENTRUM SILVER    [provider]  Jonetta Speak LANCETS [id number] [place]  11/06/16   [provider]  potassium chloride 20 MEQ TBCR Take 20 mEq by mouth daily. 01/05/15   Thurnell Lose, MD      Allergies    Ciprofloxacin, Ace inhibitors, Cedax [ceftibuten], Lipitor [atorvastatin], Plavix [clopidogrel bisulfate], and Statins    Review of Systems   Review of Systems  Physical Exam Updated Vital Signs BP (!) 114/45   Pulse (!) 59   Resp 19   SpO2 99%  Physical Exam Vitals and nursing note reviewed.  Constitutional:      General: She is not in acute distress.    Appearance: She is well-developed.  HENT:     Head: Normocephalic and atraumatic.     Mouth/Throat:     Mouth: Mucous membranes are moist.  Eyes:     General: Vision grossly intact. Gaze aligned appropriately.     Extraocular Movements: Extraocular movements intact.     Conjunctiva/sclera: Conjunctivae normal.  Cardiovascular:     Rate and Rhythm: Normal rate and regular rhythm.     Pulses: Normal pulses.     Heart sounds: Normal  heart sounds, S1 normal and S2 normal. No murmur heard.    No friction rub. No gallop.  Pulmonary:     Effort: Pulmonary effort is normal. No respiratory distress.     Breath sounds: Normal breath sounds.  Abdominal:     General: Bowel sounds are normal.     Palpations: Abdomen is soft.     Tenderness: There is no abdominal tenderness. There is no guarding or rebound.     Hernia: No hernia is present.  Musculoskeletal:        General: No swelling.     Cervical back: Full passive range of motion without pain, normal range of motion and neck supple. No spinous process tenderness or muscular tenderness. Normal range of motion.     Right lower leg: No edema.     Left lower leg: No  edema.  Skin:    General: Skin is warm and dry.     Capillary Refill: Capillary refill takes less than 2 seconds.     Findings: No ecchymosis, erythema, rash or wound.  Neurological:     General: No focal deficit present.     Mental Status: She is alert. She is disoriented.     GCS: GCS eye subscore is 4. GCS verbal subscore is 4. GCS motor subscore is 6.     Cranial Nerves: Cranial nerves 2-12 are intact.     Sensory: Sensation is intact.     Motor: Motor function is intact.     ED Results / Procedures / Treatments   Labs (all labs ordered are listed, but only abnormal results are displayed) Labs Reviewed  LACTIC ACID, PLASMA - Abnormal; Notable for the following components:      Result Value   Lactic Acid, Venous 2.1 (*)    All other components within normal limits  LACTIC ACID, PLASMA - Abnormal; Notable for the following components:   Lactic Acid, Venous 2.5 (*)    All other components within normal limits  COMPREHENSIVE METABOLIC PANEL - Abnormal; Notable for the following components:   Potassium 3.1 (*)    Glucose, Bld 179 (*)    All other components within normal limits  CBC WITH DIFFERENTIAL/PLATELET - Abnormal; Notable for the following components:   Neutro Abs 7.9 (*)    Lymphs Abs 0.2 (*)    Monocytes Absolute 0.0 (*)    All other components within normal limits  PROTIME-INR - Abnormal; Notable for the following components:   Prothrombin Time 21.6 (*)    INR 1.9 (*)    All other components within normal limits  URINALYSIS, W/ REFLEX TO CULTURE (INFECTION SUSPECTED) - Abnormal; Notable for the following components:   Color, Urine YELLOW (*)    Hgb urine dipstick TRACE (*)    Protein, ur TRACE (*)    Nitrite POSITIVE (*)    Leukocytes,Ua SMALL (*)    Bacteria, UA MANY (*)    All other components within normal limits  CBG MONITORING, ED - Abnormal; Notable for the following components:   Glucose-Capillary 167 (*)    All other components within normal limits   CULTURE, BLOOD (ROUTINE X 2)  CULTURE, BLOOD (ROUTINE X 2)  RESP PANEL BY RT-PCR (RSV, FLU A&B, COVID)  RVPGX2  APTT    EKG EKG Interpretation  Date/Time:  Tuesday February 28 2022 02:30:46 EST Ventricular Rate:  73 PR Interval:    QRS Duration: 176 QT Interval:  453 QTC Calculation: 500 R Axis:   -72 Text Interpretation: VENTRICULAR PACED  RHYTHM No significant change since last tracing Confirmed by Orpah Greek [phone]) on 02/28/2022 4:35:57 AM  Radiology DG Chest Port 1 View  Result Date: 02/28/2022 CLINICAL DATA:  Sepsis EXAM: PORTABLE CHEST 1 VIEW COMPARISON:  01/04/2015 FINDINGS: Shallow lung inflation. Cardiomediastinal contours are normal. No focal airspace consolidation or pulmonary edema. No pleural effusion or pneumothorax. Unchanged position of left chest wall AICD leads. IMPRESSION: Shallow lung inflation without acute cardiopulmonary process. Electronically Signed   By: Ulyses Jarred M.D.   On: 02/28/2022 03:40   CT HEAD WO CONTRAST (5MM)  Result Date: 02/28/2022 CLINICAL DATA:  Altered mental status EXAM: CT HEAD WITHOUT CONTRAST TECHNIQUE: Contiguous axial images were obtained from the base of the skull through the vertex without intravenous contrast. RADIATION DOSE REDUCTION: This exam was performed according to the departmental dose-optimization program which includes automated exposure control, adjustment of the mA and/or kV according to patient size and/or use of iterative reconstruction technique. COMPARISON:  01/05/2005 FINDINGS: Brain: There is no mass, hemorrhage or extra-axial collection. The size and configuration of the ventricles and extra-axial CSF spaces are normal. There is hypoattenuation of the white matter, most commonly indicating chronic small vessel disease. Vascular: No abnormal hyperdensity of the major intracranial arteries or dural venous sinuses. No intracranial atherosclerosis. Skull: The visualized skull base, calvarium and extracranial  soft tissues are normal. Sinuses/Orbits: No fluid levels or advanced mucosal thickening of the visualized paranasal sinuses. No mastoid or middle ear effusion. The orbits are normal. IMPRESSION: No acute intracranial abnormality. Electronically Signed   By: Ulyses Jarred M.D.   On: 02/28/2022 03:34    Procedures Procedures    Medications Ordered in ED Medications  cefTRIAXone (ROCEPHIN) 2 g in sodium chloride 0.9 % 100 mL IVPB (2 g Intravenous New Bag/Given 02/28/22 0456)    ED Course/ Medical Decision Making/ A&P                             Medical Decision Making Amount and/or Complexity of Data Reviewed Independent Historian: caregiver External Data Reviewed: labs, ECG and notes. Labs: ordered. Decision-making details documented in ED Course. Radiology: ordered and independent interpretation performed. Decision-making details documented in ED Course. ECG/medicine tests: ordered and independent interpretation performed. Decision-making details documented in ED Course.  Risk Decision regarding hospitalization.   Patient arrives to the emergency department for altered mental status.  Differential diagnosis considered included metabolic encephalopathy secondary to multiple medical conditions and acute stroke.  Patient's evaluation was nonfocal at arrival.  EMS was concerned about possible right facial droop.  I do not appreciate a facial droop and patient's daughter, at the bedside confirms that there is no facial asymmetry.  Patient is moving all 4 extremities without difficulty.  Additionally, patient is on Eliquis.  If there is a deficit is extremely mild and she is not a candidate for thrombolytics secondary to Eliquis use as well as minimal deficit.  Code stroke was therefore not called.  Workup was performed.  CT head unremarkable.  She does have an elevated lactic acid at 2.5.  He does not, however, have a leukocytosis.  She is afebrile.  No tachycardia or tachypnea.  She  essentially does not have any SIRS criteria but she does have an obvious urinary tract infection on urinalysis.  Cultures pending.  Patient administered Rocephin 2 g IV.  Patient's blood pressures have been low at times.  This could be due to the infection, however, discussing this  with her daughter reveals that the patient has been experiencing low blood pressures recently.  This has caused her cardiologist to stop her Coreg.  This is likely multifactorial.  She was given IV fluids with improvement.  As she is extremely weak, unsteady on her feet and still altered, will admit.        Final Clinical Impression(s) / ED Diagnoses Final diagnoses:  Delirium  Urinary tract infection without hematuria, site unspecified    Rx / DC Orders ED Discharge Orders     None         Navarro Nine, Gwenyth Allegra, MD 02/28/22 [phone]

## 2022-02-28 NOTE — Assessment & Plan Note (Signed)
-  In the setting of UTI -Continue constant reorientation, maintain adequate hydration and provide IV antibiotics -Follow clinical response.

## 2022-02-28 NOTE — Assessment & Plan Note (Signed)
-  Chronic diastolic heart failure -Stable and compensated at this moment -Given soft blood pressure on presentation and acute UTI process will hold diuretics and provide gentle/judicious fluid resuscitation in the next 24 hours. -Follow daily weights and strict I's and O's. -Resume Lasix when appropriate.

## 2022-02-28 NOTE — Assessment & Plan Note (Signed)
-  Repletion orders given -Electrolytes trend and replete as needed -Check magnesium level -Telemetry monitoring has been ordered.

## 2022-02-28 NOTE — ED Triage Notes (Addendum)
Pt presents with altered mental status. Pt lives with daughter and Dagoberto Reef was at 11pm last night according to daughter. Pt was complaining of back pain and feeling cold before going to bed. Pt called out to daughter this morning and was altered and could not get out of bed. Pt not able to follow commands at this time.

## 2022-02-28 NOTE — H&P (Signed)
History and Physical    Patient: Brittany [name] [id number] DOB: 09-20-1927 DOA: 02/28/2022 DOS: the patient was seen and examined on 02/28/2022 PCP: Bernerd Limbo, MD  Patient coming from: Home  Chief Complaint:  Chief Complaint  Patient presents with   Altered Mental Status   HPI: Brittany [name] is a 89 y.o. female with medical history significant of history of atrial flutter, complete heart block status post pacemaker implantation, chronic diastolic heart failure, hypertension, type 2 diabetes mellitus and hyperlipidemia; presented to the hospital secondary to generalized weakness and altered mental status changes.  Patient's symptoms has presented partially abruptly in the last 24-48 hours prior to admission; demonstrating inability with assistance to go to the bathroom (something that she has normally being able to do on her own), and noticing to be more confused than usual. Patient denies pain, expressed chills, no nausea, no vomiting, no abdominal pain or focal weaknesses.  Workup in the ED: CT scan without acute intracranial abnormalities; positive urinalysis suggesting UTI and mild dehydration.  Cultures taken, IV fluids given and antibiotics started.  TRH contacted to place patient in the hospital for further evaluation and management.   Review of Systems: As mentioned in the history of present illness. All other systems reviewed and are negative. Past Medical History:  Diagnosis Date   Atrial flutter ([place])    CAD (coronary artery disease)    CHF (congestive heart failure) (HCC)    Complete heart block ([place])    a. s/p STJ dual chamber PPM   Depression    Glaucoma    Hyperlipidemia    Hypertension    Type II diabetes mellitus ([place])    Past Surgical History:  Procedure Laterality Date   BREAST BIOPSY  1991   ELECTROPHYSIOLOGIC STUDY N/A 02/12/2015   cardioversion by Dr Caryl Comes    EP IMPLANTABLE DEVICE N/A 04/07/2015   a. STJ dual chamber PPM implanted 2006 for complete  heart block b. gen change 2017   FEMUR IM NAIL  01/18/2012   Procedure: INTRAMEDULLARY (IM) NAIL FEMORAL;  Surgeon: Marin Shutter, MD;  Location: [place];  Service: Orthopedics;  Laterality: Left;   left total knee replacement Bilateral 1996   TONSILLECTOMY  1937   Social History:  reports that she quit smoking about 71 years ago. Her smoking use included cigarettes. She has never used smokeless tobacco. She reports current alcohol use. She reports that she does not use drugs.  Allergies  Allergen Reactions   Ciprofloxacin Other (See Comments)    Severe chest pains   Ace Inhibitors Other (See Comments)    REACTION: cough and sore throat   Cedax [Ceftibuten] Other (See Comments)    Possibly nausea/vomiting, cyanosis, severe headaches   Lipitor [Atorvastatin] Other (See Comments)    REACTION: Myalgia   Plavix [Clopidogrel Bisulfate] Other (See Comments)    Possibly nausea/vomiting, cyanosis, severe headaches     Statins Other (See Comments)    Myalgias, cyanosis    Family History  Problem Relation Age of Onset   Diabetes Mother    Heart attack Father    Stroke Father    Hypertension Father    Kidney disease Daughter     Prior to Admission medications   Medication Sig Start Date End Date Taking? Authorizing Provider  acetaminophen (TYLENOL) 325 MG tablet Take 650 mg by mouth as needed.   Yes [provider]  cyanocobalamin 1000 MCG tablet Take by mouth daily.   Yes [provider]  docusate sodium (  COLACE) 100 MG capsule Take 100 mg by mouth daily.   Yes [provider]  ELIQUIS 5 MG TABS tablet TAKE 1 TABLET TWICE A DAY Patient taking differently: Take 5 mg by mouth 2 (two) times daily. 09/05/17  Yes Deboraha Sprang, MD  furosemide (LASIX) 80 MG tablet Take 1 tablet (80 mg total) by mouth daily. 09/23/21  Yes Deboraha Sprang, MD  irbesartan (AVAPRO) 75 MG tablet Take 0.5 tablets (37.5 mg total) by mouth daily. 04/12/17  Yes Seiler, Amber K, NP   isosorbide mononitrate (IMDUR) 30 MG 24 hr tablet Take 1 tablet by mouth daily. 09/04/18  Yes [provider]  metFORMIN (GLUCOPHAGE-XR) 500 MG 24 hr tablet Take 500 mg by mouth daily with breakfast. 12/10/14  Yes [provider]  Multiple Vitamin (MULTIVITAMIN) tablet Take 1 tablet by mouth daily. CENTRUM SILVER   Yes [provider]  potassium chloride 20 MEQ TBCR Take 20 mEq by mouth daily. 01/05/15  Yes Thurnell Lose, MD  senna-docusate (SENOKOT-S) 8.6-50 MG tablet Take 1 tablet by mouth daily.   Yes [provider]  glucose blood test strip USE FOR TESTING ONCE DAILY 08/07/16   [provider]  hydrocortisone (ANUSOL-HC) 25 MG suppository Insert 1 suppository in the rectum before bedtime for 10 days for internal hemorrhoids. Repeat as needed. Patient not taking: Reported on 02/28/2022 08/09/17   Alfredia Ferguson, PA-C  [facility] [place] LANCETS [id number] [place]  11/06/16   [provider]    Physical Exam: Vitals:   02/28/22 0714 02/28/22 0830 02/28/22 0912 02/28/22 1010  BP:  (!) 107/43 (!) 105/42 (!) 106/44  Pulse:  76 73 71  Resp:  20 19 17  $ Temp: 99.9 F (37.7 C)   98.4 F (36.9 C)  TempSrc: Rectal     SpO2:  97% 96% 97%   General exam: Alert, awake, oriented to person and place currently; recognizes daughter at bedside.  Following simple commands.  Expressing chills. Respiratory system: Clear to auscultation. Respiratory effort normal.  No using accessory muscle.  2 L nasal cannula supplementation for comfort applied in the ED with saturations in the mid 90s. Cardiovascular system: Rate controlled, no rubs, no gallops, no JVD; pacemaker in place. Gastrointestinal system: Abdomen is nondistended, soft and nontender. No organomegaly or masses felt. Normal bowel sounds heard. Central nervous system: No focal neurological deficits. Extremities: No cyanosis or clubbing; no edema. Skin: No petechiae. Psychiatry: Mood & affect appropriate.    Data Reviewed: Lactic acid: 2.1 Comprehensive metabolic panel: Sodium 0000000, potassium 3.1, chloride 103, bicarb 26, CBG 179, BUN 16, creatinine 0.81 and normal LFTs CBC with differential: WBCs 8.2, hemoglobin 12.9 and platelet count 157 K; absolute neutrophils 7.9 Respiratory panel by PCR: Negative COVID, RSV and influenza.  Assessment and Plan: * Acute metabolic encephalopathy -In the setting of UTI -Continue constant reorientation, maintain adequate hydration and provide IV antibiotics -Follow clinical response.  Hypokalemia -Repletion orders given -Electrolytes trend and replete as needed -Check magnesium level -Telemetry monitoring has been ordered.  Atrial flutter (HCC)-tachycardia -Rate controlled -Patient with permanent pacemaker -Continue Eliquis for secondary prevention. -Telemetry monitoring has been requested. -Continue to follow electrolytes and keep things stable (goal is for potassium above 4 and magnesium above 2).  DM2 (diabetes mellitus, type 2) ([place]) -Update A1c -Hold oral hypoglycemic agents -Sliding scale insulin has been ordered. -CBG fluctuation.   CHF (congestive heart failure) (HCC) -Chronic diastolic heart failure -Stable and compensated at this moment -Given soft blood pressure  on presentation and acute UTI process will hold diuretics and provide gentle/judicious fluid resuscitation in the next 24 hours. -Follow daily weights and strict I's and O's. -Resume Lasix when appropriate.  AV BLOCK, COMPLETE -Status post pacemaker implantation -Continue patient follow-up with electrophysiology -Last visit 02/20/2022; everything was stable and functioning as intended.  Essential hypertension -Blood pressure soft currently -Holding oral hypoglycemic agent -Follow-up vital signs and adjust regimen as needed.      Advance Care Planning:   Code Status: DNR   Consults: None  Family Communication: Daughter at bedside.  Severity of Illness: The  appropriate patient status for this patient is INPATIENT. Inpatient status is judged to be reasonable and necessary in order to provide the required intensity of service to ensure the patient's safety. The patient's presenting symptoms, physical exam findings, and initial radiographic and laboratory data in the context of their chronic comorbidities is felt to place them at high risk for further clinical deterioration. Furthermore, it is not anticipated that the patient will be medically stable for discharge from the hospital within 2 midnights of admission.   * I certify that at the point of admission it is my clinical judgment that the patient will require inpatient hospital care spanning beyond 2 midnights from the point of admission due to high intensity of service, high risk for further deterioration and high frequency of surveillance required.*  Author: Barton Dubois, MD 02/28/2022 10:35 AM  For on call review [email or web address].

## 2022-03-01 DIAGNOSIS — G9341 Metabolic encephalopathy: Secondary | ICD-10-CM | POA: Diagnosis not present

## 2022-03-01 LAB — URINALYSIS, W/ REFLEX TO CULTURE (INFECTION SUSPECTED)
Bilirubin Urine: NEGATIVE
Glucose, UA: NEGATIVE mg/dL
Hgb urine dipstick: NEGATIVE
Ketones, ur: NEGATIVE mg/dL
Nitrite: NEGATIVE
Protein, ur: 30 mg/dL — AB
Specific Gravity, Urine: 1.017 (ref 1.005–1.030)
pH: 8 (ref 5.0–8.0)

## 2022-03-01 LAB — BASIC METABOLIC PANEL
Anion gap: 4 — ABNORMAL LOW (ref 5–15)
BUN: 16 mg/dL (ref 8–23)
CO2: 26 mmol/L (ref 22–32)
Calcium: 9.8 mg/dL (ref 8.9–10.3)
Chloride: 109 mmol/L (ref 98–111)
Creatinine, Ser: 0.66 mg/dL (ref 0.44–1.00)
GFR, Estimated: >60 mL/min (ref >60)
Glucose, Bld: 117 mg/dL — ABNORMAL HIGH (ref 70–99)
Potassium: 4.4 mmol/L (ref 3.5–5.1)
Sodium: 139 mmol/L (ref 135–145)

## 2022-03-01 LAB — CBC
HCT: 36.4 % (ref 36.0–46.0)
Hemoglobin: 11.9 g/dL — ABNORMAL LOW (ref 12.0–15.0)
MCH: 29.7 pg (ref 26.0–34.0)
MCHC: 32.7 g/dL (ref 30.0–36.0)
MCV: 90.8 fL (ref 80.0–100.0)
Platelets: 131 10*3/uL — ABNORMAL LOW (ref 150–400)
RBC: 4.01 MIL/uL (ref 3.87–5.11)
RDW: 14.7 % (ref 11.5–15.5)
WBC: 9.9 10*3/uL (ref 4.0–10.5)
nRBC: 0 % (ref 0.0–0.2)

## 2022-03-01 LAB — GLUCOSE, CAPILLARY
Glucose-Capillary: 87 mg/dL (ref 70–99)
Glucose-Capillary: 180 mg/dL — ABNORMAL HIGH (ref 70–99)
Glucose-Capillary: 190 mg/dL — ABNORMAL HIGH (ref 70–99)
Glucose-Capillary: 129 mg/dL — ABNORMAL HIGH (ref 70–99)

## 2022-03-01 LAB — VITAMIN B12: Vitamin B-12: 309 pg/mL (ref 180–914)

## 2022-03-01 LAB — MAGNESIUM: Magnesium: 1.9 mg/dL (ref 1.7–2.4)

## 2022-03-01 LAB — TSH: TSH: 1.25 u[IU]/mL (ref 0.350–4.500)

## 2022-03-01 MED ORDER — ENSURE ENLIVE PO LIQD
237.0000 mL | Freq: Two times a day (BID) | ORAL | Status: DC
  Administered 2022-03-01 – 2022-03-02 (×2): 237 mL via ORAL

## 2022-03-01 NOTE — Progress Notes (Signed)
Pt intermittently screaming out "Brittany [name]"!. She remains confused. No acute events overnight. Brittany Corona Edd Fabian

## 2022-03-01 NOTE — Progress Notes (Signed)
Initial Nutrition Assessment  DOCUMENTATION CODES:   Not applicable  INTERVENTION:  Continue diet recommendations per ST Ensure Enlive po BID, each supplement provides 350 kcal and 20 grams of protein. Magic cup TID with meals, each supplement provides 290 kcal and 9 grams of protein Continue MVI with minerals daily  NUTRITION DIAGNOSIS:   Inadequate oral intake related to acute illness as evidenced by per patient/family report.  GOAL:   Patient will meet greater than or equal to 90% of their needs  MONITOR:   PO intake, Supplement acceptance, Labs, Weight trends  REASON FOR ASSESSMENT:   Malnutrition Screening Tool    ASSESSMENT:   Pt admitted with AMS in the setting of UTI. PMH significant for aflutter, complete heart block s/p pacemaker, chronic dHF, HTN, T2DM, HLD.  2/20- s/p BSE- recommend dysphagia 3, thin liquids  Per flowsheet documentation, pt noted to be oriented x1.   Pt sitting up in bed at time of visit. Her daughter present at bedside who provides nutrition related history. She reports that pt was eating at her baseline up until 2 days ago. At that time she did not eating or finishing meals. Pt typically east warm cereal and 2 cookie/biscuits for breakfast. Uncertain how pt eats remainder of the day. When she does not eat as well, she prepared a "milkshake" with Boost for her to drink.   Pt's daughter reports that she was told she ate all of her breakfast this morning.   She reports that the pt's weight has gradually been declining within the last year. Noting that her weight was around 180 lbs and is now down to 150 lbs. Unfortunately, cannot confirm this weight loss d/t limited weight history on file within the last year.  Weight noted to be 69.4 kg on 09/05/21 and now appears to be 69.1 kg. Will continue to monitor.   Medications: SSI 0-5 units qhs, SSI 0-9 units TID, MVI, senna, IV abx  Labs: anion gap 4, CBG's 87- 167 x24 hours  NUTRITION - FOCUSED  PHYSICAL EXAM:  Flowsheet Row Most Recent Value  Orbital Region No depletion  Upper Arm Region No depletion  Thoracic and Lumbar Region No depletion  Buccal Region No depletion  Temple Region Moderate depletion  Clavicle Bone Region No depletion  Clavicle and Acromion Bone Region No depletion  Scapular Bone Region No depletion  Dorsal Hand No depletion  Patellar Region No depletion  Anterior Thigh Region No depletion  Posterior Calf Region No depletion  Edema (RD Assessment) None  Hair Reviewed  Eyes Reviewed  Mouth Reviewed  Skin Reviewed  Nails Reviewed       Diet Order:   Diet Order             DIET DYS 3 Room service appropriate? Yes; Fluid consistency: Thin  Diet effective now                   EDUCATION NEEDS:   Education needs have been addressed  Skin:  Skin Assessment: Reviewed RN Assessment  Last BM:  2/20  Height:   Ht Readings from Last 1 Encounters:  02/20/22 4' 11"$  (1.499 m)    Weight:   Wt Readings from Last 1 Encounters:  03/01/22 69.1 kg    Ideal Body Weight:  44.7 kg  BMI:  Body mass index is 30.77 kg/m.  Estimated Nutritional Needs:   Kcal:  1300-1500  Protein:  65-80g  Fluid:  1.3-1.5L  Clayborne Dana, RDN, LDN Clinical Nutrition

## 2022-03-01 NOTE — TOC Progression Note (Signed)
  Transition of Care [facility]) Screening Note   Patient Details  Name: Brittany [name] Date of Birth: [birth year]   Transition of Care [facility]) CM/SW Contact:    Shade Flood, LCSW Phone Number: 03/01/2022, 1:08 PM    Transition of Care Department [facility]) has reviewed patient and no TOC needs have been identified at this time. We will continue to monitor patient advancement through interdisciplinary progression rounds. If new patient transition needs arise, please place a TOC consult.

## 2022-03-01 NOTE — Progress Notes (Signed)
PROGRESS NOTE     Brittany [name], is a 89 y.o. female, DOB - May 30, 1927, [id number]  Admit date - 02/28/2022   Admitting Physician Rolla Plate, DO  Outpatient Primary MD for the patient is Bernerd Limbo, MD  LOS - 1  Chief Complaint  Patient presents with   Altered Mental Status        Brief Narrative:  89 y.o. female with medical history significant of history of atrial flutter, complete heart block status post pacemaker implantation, chronic diastolic heart failure, hypertension, type 2 diabetes mellitus and hyperlipidemia admitted with acute metabolic encephalopathy on 02/28/2022 with concerns for UTI    -Assessment and Plan: 1)Acute metabolic encephalopathy -In the setting of presumed  UTI -Continue constant reorientation, maintain adequate hydration and provide IV antibiotics -Continue IV Rocephin pending urine culture  2)Hypokalemia -Replaced and normalized -Magnesium WNL  3)Atrial flutter (HCC)-tachycardia -Rate controlled -Patient with permanent pacemaker -Continue Eliquis for secondary prevention.  4)DM2 (diabetes mellitus, type 2) (HCC) - A1c 5.9--- reflecting excellent diabetic control PTA Use Novolog/Humalog Sliding scale insulin with Accu-Cheks/Fingersticks as ordered    CHF /-Chronic diastolic heart failure -Stable and compensated at this moment -Hold Lasix for now due to soft BP in the setting of infection and dehydration -Resume Lasix when appropriate.  AV BLOCK, COMPLETE -Status post pacemaker implantation -Continue patient follow-up with electrophysiology -Last visit 02/20/2022; everything was stable and functioning as intended.  Status is: Inpatient   Disposition: The patient is from: Home              Anticipated d/c is to: Home with [facility]              Anticipated d/c date is: 1 day              Patient currently is not medically stable to d/c. Barriers: Not Clinically Stable-   Code Status :  -  Code Status: DNR   Family  Communication:    NA (patient is alert, awake and coherent)   DVT Prophylaxis  :   - SCDs    apixaban (ELIQUIS) tablet 5 mg   Lab Results  Component Value Date   PLT 131 (L) 03/01/2022    Inpatient Medications  Scheduled Meds:  apixaban  5 mg Oral BID   feeding supplement  237 mL Oral BID BM   insulin aspart  0-5 Units Subcutaneous QHS   insulin aspart  0-9 Units Subcutaneous TID WC   multivitamin with minerals  1 tablet Oral Daily   senna-docusate  1 tablet Oral Daily   Continuous Infusions:  cefTRIAXone (ROCEPHIN)  IV Stopped (03/01/22 0551)   PRN Meds:.acetaminophen **OR** acetaminophen, ondansetron **OR** ondansetron (ZOFRAN) IV   Anti-infectives (From admission, onward)    Start     Dose/Rate Route Frequency Ordered Stop   03/01/22 0500  cefTRIAXone (ROCEPHIN) 1 g in sodium chloride 0.9 % 100 mL IVPB        1 g 200 mL/hr over 30 Minutes Intravenous Every 24 hours 02/28/22 0847     02/28/22 0445  cefTRIAXone (ROCEPHIN) 2 g in sodium chloride 0.9 % 100 mL IVPB        2 g 200 mL/hr over 30 Minutes Intravenous  Once 02/28/22 0438 02/28/22 0528         Subjective: Brittany [name] today has no fevers, no emesis,  No chest pain,   - Operative, confusion appears to be improving   Objective: Vitals:   02/28/22 1815 02/28/22 2053 03/01/22 0610 03/01/22  1254  BP: 119/81 103/69 (!) 108/49 110/66  Pulse: 70 72 68 75  Resp: 17 18 16 17  $ Temp: 98.4 F (36.9 C) 97.9 F (36.6 C) 98 F (36.7 C) 98.5 F (36.9 C)  TempSrc: Oral Oral Oral Oral  SpO2: 98% 100% 98% 98%  Weight:   69.1 kg     Intake/Output Summary (Last 24 hours) at 03/01/2022 1617 Last data filed at 03/01/2022 1323 Gross per 24 hour  Intake 1481.2 ml  Output --  Net 1481.2 ml   Filed Weights   03/01/22 0610  Weight: 69.1 kg    Physical Exam  Gen:- Awake Alert, cooperative HEENT:- [place].AT, No sclera icterus Nose- [place] 2L/min Neck-Supple Neck,No JVD,.  Lungs-no wheezing,, fair symmetrical air  movement CV- S1, S2 normal, irregular  Abd-  +ve B.Sounds, Abd Soft, No tenderness,    Extremity/Skin:- No  edema, pedal pulses present  Psych-affect is appropriate, oriented x3 Neuro-generalized/, no new focal deficits, no tremors  Data Reviewed: I have personally reviewed following labs and imaging studies  CBC: Recent Labs  Lab 02/28/22 0226 03/01/22 0457  WBC 8.2 9.9  NEUTROABS 7.9*  --   HGB 12.9 11.9*  HCT 38.8 36.4  MCV 89.0 90.8  PLT 157 [id number]*   Basic Metabolic Panel: Recent Labs  Lab 02/28/22 0226 03/01/22 0457  NA 137 139  K 3.1* 4.4  CL 103 109  CO2 26 26  GLUCOSE 179* 117*  BUN 16 16  CREATININE 0.81 0.66  CALCIUM 10.1 9.8  MG  --  1.9   GFR: Estimated Creatinine Clearance: 38 mL/min (by C-G formula based on SCr of 0.66 mg/dL). Liver Function Tests: Recent Labs  Lab 02/28/22 0226  AST 31  ALT 26  ALKPHOS 72  BILITOT 1.1  PROT 7.1  ALBUMIN 3.7   HbA1C: Recent Labs    02/28/22 0226  HGBA1C 5.9*   Recent Results (from the past 240 hour(s))  Resp panel by RT-PCR (RSV, Flu A&B, Covid) Anterior Nasal Swab     Status: None   Collection Time: 02/28/22  2:52 AM   Specimen: Anterior Nasal Swab  Result Value Ref Range Status   SARS Coronavirus 2 by RT PCR NEGATIVE NEGATIVE Final    Comment: (NOTE) SARS-CoV-2 target nucleic acids are NOT DETECTED.  The SARS-CoV-2 RNA is generally detectable in upper respiratory specimens during the acute phase of infection. The lowest concentration of SARS-CoV-2 viral copies this assay can detect is 138 copies/mL. A negative result does not preclude SARS-Cov-2 infection and should not be used as the sole basis for treatment or other patient management decisions. A negative result may occur with  improper specimen collection/handling, submission of specimen other than nasopharyngeal swab, presence of viral mutation(s) within the areas targeted by this assay, and inadequate number of viral copies(<138 copies/mL).  A negative result must be combined with clinical observations, patient history, and epidemiological information. The expected result is Negative.  Fact Sheet for Patients:  EntrepreneurPulse.com.au  Fact Sheet for Healthcare Providers:  IncredibleEmployment.be  This test is no t yet approved or cleared by the [place] FDA and  has been authorized for detection and/or diagnosis of SARS-CoV-2 by FDA under an Emergency Use Authorization (EUA). This EUA will remain  in effect (meaning this test can be used) for the duration of the COVID-19 declaration under Section 564(b)(1) of the Act, 21 U.S.C.section 360bbb-3(b)(1), unless the authorization is terminated  or revoked sooner.       Influenza A by PCR  NEGATIVE NEGATIVE Final   Influenza B by PCR NEGATIVE NEGATIVE Final    Comment: (NOTE) The Xpert Xpress SARS-CoV-2/FLU/RSV plus assay is intended as an aid in the diagnosis of influenza from Nasopharyngeal swab specimens and should not be used as a sole basis for treatment. Nasal washings and aspirates are unacceptable for Xpert Xpress SARS-CoV-2/FLU/RSV testing.  Fact Sheet for Patients: EntrepreneurPulse.com.au  Fact Sheet for Healthcare Providers: IncredibleEmployment.be  This test is not yet approved or cleared by the [place] FDA and has been authorized for detection and/or diagnosis of SARS-CoV-2 by FDA under an Emergency Use Authorization (EUA). This EUA will remain in effect (meaning this test can be used) for the duration of the COVID-19 declaration under Section 564(b)(1) of the Act, 21 U.S.C. section 360bbb-3(b)(1), unless the authorization is terminated or revoked.     Resp Syncytial Virus by PCR NEGATIVE NEGATIVE Final    Comment: (NOTE) Fact Sheet for Patients: EntrepreneurPulse.com.au  Fact Sheet for Healthcare  Providers: IncredibleEmployment.be  This test is not yet approved or cleared by the [place] FDA and has been authorized for detection and/or diagnosis of SARS-CoV-2 by FDA under an Emergency Use Authorization (EUA). This EUA will remain in effect (meaning this test can be used) for the duration of the COVID-19 declaration under Section 564(b)(1) of the Act, 21 U.S.C. section 360bbb-3(b)(1), unless the authorization is terminated or revoked.  Performed at [facility], [address], [place], [place] [address]   Blood Culture (routine x 2)     Status: None (Preliminary result)   Collection Time: 02/28/22  3:13 AM   Specimen: BLOOD  Result Value Ref Range Status   Specimen Description BLOOD BLOOD RIGHT HAND  Final   Special Requests   Final    BOTTLES DRAWN AEROBIC AND ANAEROBIC Blood Culture results may not be optimal due to an inadequate volume of blood received in culture bottles   Culture   Final    NO GROWTH 1 DAY Performed at [facility], [address] [place], [place] [address]    Report Status PENDING  Incomplete  Blood Culture (routine x 2)     Status: None (Preliminary result)   Collection Time: 02/28/22  3:14 AM   Specimen: BLOOD  Result Value Ref Range Status   Specimen Description BLOOD LEFT ANTECUBITAL  Final   Special Requests   Final    BOTTLES DRAWN AEROBIC AND ANAEROBIC Blood Culture adequate volume   Culture   Final    NO GROWTH 1 DAY Performed at [facility], [address] [place],  [address]    Report Status PENDING  Incomplete      Radiology Studies: DG Chest Port 1 View  Result Date: 02/28/2022 CLINICAL DATA:  Sepsis EXAM: PORTABLE CHEST 1 VIEW COMPARISON:  01/04/2015 FINDINGS: Shallow lung inflation. Cardiomediastinal contours are normal. No focal airspace consolidation or pulmonary edema. No pleural effusion or pneumothorax. Unchanged position of left chest wall AICD leads. IMPRESSION: Shallow lung inflation without  acute cardiopulmonary process. Electronically Signed   By: Ulyses Jarred M.D.   On: 02/28/2022 03:40   CT HEAD WO CONTRAST (5MM)  Result Date: 02/28/2022 CLINICAL DATA:  Altered mental status EXAM: CT HEAD WITHOUT CONTRAST TECHNIQUE: Contiguous axial images were obtained from the base of the skull through the vertex without intravenous contrast. RADIATION DOSE REDUCTION: This exam was performed according to the departmental dose-optimization program which includes automated exposure control, adjustment of the mA and/or kV according to patient size and/or use of  iterative reconstruction technique. COMPARISON:  01/05/2005 FINDINGS: Brain: There is no mass, hemorrhage or extra-axial collection. The size and configuration of the ventricles and extra-axial CSF spaces are normal. There is hypoattenuation of the white matter, most commonly indicating chronic small vessel disease. Vascular: No abnormal hyperdensity of the major intracranial arteries or dural venous sinuses. No intracranial atherosclerosis. Skull: The visualized skull base, calvarium and extracranial soft tissues are normal. Sinuses/Orbits: No fluid levels or advanced mucosal thickening of the visualized paranasal sinuses. No mastoid or middle ear effusion. The orbits are normal. IMPRESSION: No acute intracranial abnormality. Electronically Signed   By: Ulyses Jarred M.D.   On: 02/28/2022 03:34    Scheduled Meds:  apixaban  5 mg Oral BID   feeding supplement  237 mL Oral BID BM   insulin aspart  0-5 Units Subcutaneous QHS   insulin aspart  0-9 Units Subcutaneous TID WC   multivitamin with minerals  1 tablet Oral Daily   senna-docusate  1 tablet Oral Daily   Continuous Infusions:  cefTRIAXone (ROCEPHIN)  IV Stopped (03/01/22 0551)    LOS: 1 day   Roxan Hockey M.D on 03/01/2022 at 4:17 PM  Go to [email or web address] - for contact info  [facility] - Office  [phone]  If 7PM-7AM, please contact  night-coverage [email or web address] 03/01/2022, 4:17 PM

## 2022-03-01 NOTE — Progress Notes (Signed)
Mobility Specialist Progress Note:    03/01/22 1206  Mobility  Activity Ambulated with assistance in room  Level of Assistance Minimal assist, patient does 75% or more  Assistive Device Front wheel walker  Distance Ambulated (ft) 16 ft  Activity Response Tolerated well  Mobility Referral Yes  $Mobility charge 1 Mobility   Pt was agreeable to mobility session after max encouragement. C/o hernia pain, according to daughter, when pt screamed "ouch" and pointed to hip. Required MinA to stand, MinG to ambulate in room. Tolerated well, asx throughout. Daughter assisted with repositioning in bed. Left pt in room with daughter, all needs met, call bell in reach, bed alarm on.   Royetta Crochet Mobility Specialist Please contact via Solicitor or  Rehab office at [phone]

## 2022-03-02 ENCOUNTER — Ambulatory Visit (INDEPENDENT_AMBULATORY_CARE_PROVIDER_SITE_OTHER): Payer: Medicare Other

## 2022-03-02 DIAGNOSIS — I442 Atrioventricular block, complete: Secondary | ICD-10-CM

## 2022-03-02 LAB — URINE CULTURE: Culture: NO GROWTH

## 2022-03-02 LAB — GLUCOSE, CAPILLARY: Glucose-Capillary: 110 mg/dL — ABNORMAL HIGH (ref 70–99)

## 2022-03-02 MED ORDER — FUROSEMIDE 40 MG PO TABS: 40.0000 mg | ORAL_TABLET | Freq: Every day | ORAL | 1 refills | Status: DC

## 2022-03-02 MED ORDER — CEPHALEXIN 500 MG PO CAPS: 500.0000 mg | ORAL_CAPSULE | Freq: Three times a day (TID) | ORAL | 0 refills | Status: AC

## 2022-03-02 MED ORDER — SENNOSIDES-DOCUSATE SODIUM 8.6-50 MG PO TABS: 2.0000 | ORAL_TABLET | Freq: Two times a day (BID) | ORAL | 1 refills | Status: AC

## 2022-03-02 NOTE — Progress Notes (Signed)
Ng Discharge Note  Admit Date:  02/28/2022 Discharge date: 03/02/2022   GLYNIS HASSELMAN to be D/C'd Home per MD order.  AVS completed. Patient/caregiver able to verbalize understanding.  Discharge Medication: Allergies as of 03/02/2022       Reactions   Ciprofloxacin Other (See Comments)   Severe chest pains   Ace Inhibitors Other (See Comments)   REACTION: cough and sore throat   Cedax [ceftibuten] Other (See Comments)   Possibly nausea/vomiting, cyanosis, severe headaches   Lipitor [atorvastatin] Other (See Comments)   REACTION: Myalgia   Plavix [clopidogrel Bisulfate] Other (See Comments)   Possibly nausea/vomiting, cyanosis, severe headaches    Statins Other (See Comments)   Myalgias, cyanosis        Medication List     STOP taking these medications    docusate sodium 100 MG capsule Commonly known as: COLACE   hydrocortisone 25 MG suppository Commonly known as: ANUSOL-HC       TAKE these medications    acetaminophen 325 MG tablet Commonly known as: TYLENOL Take 650 mg by mouth as needed.   cephALEXin 500 MG capsule Commonly known as: Keflex Take 1 capsule (500 mg total) by mouth 3 (three) times daily for 5 days.   cyanocobalamin 1000 MCG tablet Take by mouth daily.   Eliquis 5 MG Tabs tablet Generic drug: apixaban TAKE 1 TABLET TWICE A DAY What changed: how much to take   furosemide 40 MG tablet Commonly known as: LASIX Take 1 tablet (40 mg total) by mouth daily. What changed:  medication strength how much to take   glucose blood test strip USE FOR TESTING ONCE DAILY   irbesartan 75 MG tablet Commonly known as: AVAPRO Take 0.5 tablets (37.5 mg total) by mouth daily.   isosorbide mononitrate 30 MG 24 hr tablet Commonly known as: IMDUR Take 1 tablet by mouth daily.   metFORMIN 500 MG 24 hr tablet Commonly known as: GLUCOPHAGE-XR Take 500 mg by mouth daily with breakfast.   multivitamin tablet Take 1 tablet by mouth daily. CENTRUM  SILVER   OneTouch Delica Lancets [id number] Misc   Potassium Chloride ER 20 MEQ Tbcr Take 20 mEq by mouth daily.   senna-docusate 8.6-50 MG tablet Commonly known as: Senokot-S Take 2 tablets by mouth 2 (two) times daily. What changed:  how much to take when to take this        Discharge Assessment: Vitals:   03/02/22 0621 03/02/22 1000  BP: (!) 106/57 (!) 147/43  Pulse: 69 76  Resp: 18 18  Temp: 97.9 F (36.6 C) 97.6 F (36.4 C)  SpO2: 100% 100%   Skin clean, dry and intact without evidence of skin break down, no evidence of skin tears noted. IV catheter discontinued intact. Site without signs and symptoms of complications - no redness or edema noted at insertion site, patient denies c/o pain - only slight tenderness at site.  Dressing with slight pressure applied.  D/c Instructions-Education: Discharge instructions given to patient/family with verbalized understanding. D/c education completed with patient/family including follow up instructions, medication list, d/c activities limitations if indicated, with other d/c instructions as indicated by MD - patient able to verbalize understanding, all questions fully answered. Patient instructed to return to ED, call 911, or call MD for any changes in condition.  Patient escorted via [place], and D/C home via private auto.  Tsosie Billing, LPN [id number] [phone] AM

## 2022-03-02 NOTE — Care Management Important Message (Signed)
Important Message  Patient Details  Name: Brittany [name] MRN: [id number] Date of Birth: 13-Jun-1927   Medicare Important Message Given:  N/A - LOS <3 / Initial given by admissions     Tommy Medal 03/02/2022, 11:52 AM

## 2022-03-02 NOTE — TOC Transition Note (Signed)
Transition of Care [facility]) - CM/SW Discharge Note   Patient Details  Name: Brittany [name] MRN: [id number] Date of Birth: [birth year]  [facility] CM/SW Contact:  Boneta Lucks, RN Phone Number: 03/02/2022, 11:30 AM   Clinical Narrative:   Patient discharging home. MD ordering outpatient PT. CM spoke with her daughter, they want [facility] outpatient, order updated.     Final next level of care: OP Rehab Barriers to Discharge: Barriers Resolved   Discharge Placement          Name of family member notified: Daughter Patient and family notified of of transfer: 03/02/22  Discharge Plan and Services Additional resources added to the After Visit Summary for         Social Determinants of Health (SDOH) Interventions SDOH Screenings   Food Insecurity: No Food Insecurity (02/28/2022)  Housing: Low Risk  (02/28/2022)  Transportation Needs: No Transportation Needs (02/28/2022)  Utilities: Not At Risk (02/28/2022)  Tobacco Use: Medium Risk (02/28/2022)     Readmission Risk Interventions    03/02/2022   11:30 AM  Readmission Risk Prevention Plan  Post Dischage Appt Not Complete  Medication Screening Complete  Transportation Screening Complete

## 2022-03-02 NOTE — Discharge Instructions (Signed)
1)Avoid ibuprofen/Advil/Aleve/Motrin/Goody Powders/Naproxen/BC powders/Meloxicam/Diclofenac/Indomethacin and other Nonsteroidal anti-inflammatory medications as these will make you more likely to bleed and can cause stomach ulcers, can also cause Kidney problems.

## 2022-03-02 NOTE — Discharge Summary (Signed)
Brittany [name], is a 89 y.o. female  DOB [birth year]  MRN [id number].  Admission date:  02/28/2022  Admitting Physician  Rolla Plate, DO  Discharge Date:  03/02/2022   Primary MD  Bernerd Limbo, MD  Recommendations for primary care physician for things to follow:   1)Avoid ibuprofen/Advil/Aleve/Motrin/Goody Powders/Naproxen/BC powders/Meloxicam/Diclofenac/Indomethacin and other Nonsteroidal anti-inflammatory medications as these will make you more likely to bleed and can cause stomach ulcers, can also cause Kidney problems.   Admission Diagnosis  Delirium [R41.0] Urinary tract infection without hematuria, site unspecified [id number] Acute metabolic encephalopathy [id number]   Discharge Diagnosis  Delirium [R41.0] Urinary tract infection without hematuria, site unspecified [id number] Acute metabolic encephalopathy [id number]   Principal Problem:   Acute metabolic encephalopathy Active Problems:   Essential hypertension   AV BLOCK, COMPLETE   CHF (congestive heart failure) (HCC)   DM2 (diabetes mellitus, type 2) (HCC)   Atrial flutter (HCC)-tachycardia   Hypokalemia      Past Medical History:  Diagnosis Date   Atrial flutter (HCC)    CAD (coronary artery disease)    CHF (congestive heart failure) (HCC)    Complete heart block ([place])    a. s/p STJ dual chamber PPM   Depression    Glaucoma    Hyperlipidemia    Hypertension    Type II diabetes mellitus ([place])     Past Surgical History:  Procedure Laterality Date   BREAST BIOPSY  1991   ELECTROPHYSIOLOGIC STUDY N/A 02/12/2015   cardioversion by Dr Caryl Comes    EP IMPLANTABLE DEVICE N/A 04/07/2015   a. STJ dual chamber PPM implanted 2006 for complete heart block b. gen change 2017   FEMUR IM NAIL  01/18/2012   Procedure: INTRAMEDULLARY (IM) NAIL FEMORAL;  Surgeon: Marin Shutter, MD;  Location: [place];  Service: Orthopedics;  Laterality: Left;    left total knee replacement Bilateral 1996   TONSILLECTOMY  1937     HPI  from the history and physical done on the day of admission:    HPI: Brittany [name] is a 89 y.o. female with medical history significant of history of atrial flutter, complete heart block status post pacemaker implantation, chronic diastolic heart failure, hypertension, type 2 diabetes mellitus and hyperlipidemia; presented to the hospital secondary to generalized weakness and altered mental status changes.  Patient's symptoms has presented partially abruptly in the last 24-48 hours prior to admission; demonstrating inability with assistance to go to the bathroom (something that she has normally being able to do on her own), and noticing to be more confused than usual. Patient denies pain, expressed chills, no nausea, no vomiting, no abdominal pain or focal weaknesses.   Workup in the ED: CT scan without acute intracranial abnormalities; positive urinalysis suggesting UTI and mild dehydration.  Cultures taken, IV fluids given and antibiotics started.  TRH contacted to place patient in the hospital for further evaluation and management.   Review of Systems: As mentioned in the history of present illness. All other systems reviewed and are  negative.     Hospital Course:     Brief Narrative:  89 y.o. female with medical history significant of history of atrial flutter, complete heart block status post pacemaker implantation, chronic diastolic heart failure, hypertension, type 2 diabetes mellitus and hyperlipidemia admitted with acute metabolic encephalopathy on 02/28/2022 with concerns for UTI    -Assessment and Plan: 1)Acute metabolic encephalopathy -In the setting of presumed  UTI -Treated with IV Rocephin, discharged on Keflex -Urine culture pending   2)Hypokalemia -Replaced and normalized -Magnesium WNL   3)Atrial flutter (HCC)-tachycardia -Rate controlled -Patient with permanent pacemaker -Continue Eliquis  for  stroke prevention.   4)DM2 (diabetes mellitus, type 2) (HCC) - A1c 5.9--- reflecting excellent diabetic control PTA -Resume metformin    CHF /-Chronic diastolic heart failure -Stable and compensated at this moment =-Resume Lasix and Avapro   AV BLOCK, COMPLETE -Status post pacemaker implantation -Continue patient follow-up with electrophysiology -Last visit 02/20/2022; everything was stable and functioning as intended.     Disposition: The patient is from: Home              Anticipated d/c is to: Home with [facility]  Discharge Condition: Stable   Diet and Activity recommendation:  As advised  Discharge Instructions    Discharge Instructions     Ambulatory referral to Physical Therapy   Complete by: As directed    Call MD for:  difficulty breathing, headache or visual disturbances   Complete by: As directed    Call MD for:  persistant dizziness or light-headedness   Complete by: As directed    Call MD for:  persistant nausea and vomiting   Complete by: As directed    Call MD for:  severe uncontrolled pain   Complete by: As directed    Call MD for:  temperature >100.4   Complete by: As directed    Diet - low sodium heart healthy   Complete by: As directed    Discharge instructions   Complete by: As directed    1)Avoid ibuprofen/Advil/Aleve/Motrin/Goody Powders/Naproxen/BC powders/Meloxicam/Diclofenac/Indomethacin and other Nonsteroidal anti-inflammatory medications as these will make you more likely to bleed and can cause stomach ulcers, can also cause Kidney problems.   2)Follow up with Bernerd Limbo, MD -----within 1 week   Increase activity slowly   Complete by: As directed         Discharge Medications     Allergies as of 03/02/2022       Reactions   Ciprofloxacin Other (See Comments)   Severe chest pains   Ace Inhibitors Other (See Comments)   REACTION: cough and sore throat   Cedax [ceftibuten] Other (See Comments)   Possibly nausea/vomiting, cyanosis,  severe headaches   Lipitor [atorvastatin] Other (See Comments)   REACTION: Myalgia   Plavix [clopidogrel Bisulfate] Other (See Comments)   Possibly nausea/vomiting, cyanosis, severe headaches    Statins Other (See Comments)   Myalgias, cyanosis        Medication List     STOP taking these medications    docusate sodium 100 MG capsule Commonly known as: COLACE   hydrocortisone 25 MG suppository Commonly known as: ANUSOL-HC       TAKE these medications    acetaminophen 325 MG tablet Commonly known as: TYLENOL Take 650 mg by mouth as needed.   cephALEXin 500 MG capsule Commonly known as: Keflex Take 1 capsule (500 mg total) by mouth 3 (three) times daily for 5 days.   cyanocobalamin 1000 MCG tablet Take by mouth daily.  Eliquis 5 MG Tabs tablet Generic drug: apixaban TAKE 1 TABLET TWICE A DAY What changed: how much to take   furosemide 40 MG tablet Commonly known as: LASIX Take 1 tablet (40 mg total) by mouth daily. What changed:  medication strength how much to take   glucose blood test strip USE FOR TESTING ONCE DAILY   irbesartan 75 MG tablet Commonly known as: AVAPRO Take 0.5 tablets (37.5 mg total) by mouth daily.   isosorbide mononitrate 30 MG 24 hr tablet Commonly known as: IMDUR Take 1 tablet by mouth daily.   metFORMIN 500 MG 24 hr tablet Commonly known as: GLUCOPHAGE-XR Take 500 mg by mouth daily with breakfast.   multivitamin tablet Take 1 tablet by mouth daily. CENTRUM SILVER   OneTouch Delica Lancets [id number] Misc   Potassium Chloride ER 20 MEQ Tbcr Take 20 mEq by mouth daily.   senna-docusate 8.6-50 MG tablet Commonly known as: Senokot-S Take 2 tablets by mouth 2 (two) times daily. What changed:  how much to take when to take this        Major procedures and Radiology Reports - PLEASE review detailed and final reports for all details, in brief -   DG Chest Port 1 View  Result Date: 02/28/2022 CLINICAL DATA:  Sepsis EXAM:  PORTABLE CHEST 1 VIEW COMPARISON:  01/04/2015 FINDINGS: Shallow lung inflation. Cardiomediastinal contours are normal. No focal airspace consolidation or pulmonary edema. No pleural effusion or pneumothorax. Unchanged position of left chest wall AICD leads. IMPRESSION: Shallow lung inflation without acute cardiopulmonary process. Electronically Signed   By: Ulyses Jarred M.D.   On: 02/28/2022 03:40   CT HEAD WO CONTRAST (5MM)  Result Date: 02/28/2022 CLINICAL DATA:  Altered mental status EXAM: CT HEAD WITHOUT CONTRAST TECHNIQUE: Contiguous axial images were obtained from the base of the skull through the vertex without intravenous contrast. RADIATION DOSE REDUCTION: This exam was performed according to the departmental dose-optimization program which includes automated exposure control, adjustment of the mA and/or kV according to patient size and/or use of iterative reconstruction technique. COMPARISON:  01/05/2005 FINDINGS: Brain: There is no mass, hemorrhage or extra-axial collection. The size and configuration of the ventricles and extra-axial CSF spaces are normal. There is hypoattenuation of the white matter, most commonly indicating chronic small vessel disease. Vascular: No abnormal hyperdensity of the major intracranial arteries or dural venous sinuses. No intracranial atherosclerosis. Skull: The visualized skull base, calvarium and extracranial soft tissues are normal. Sinuses/Orbits: No fluid levels or advanced mucosal thickening of the visualized paranasal sinuses. No mastoid or middle ear effusion. The orbits are normal. IMPRESSION: No acute intracranial abnormality. Electronically Signed   By: Ulyses Jarred M.D.   On: 02/28/2022 03:34   CUP PACEART INCLINIC DEVICE CHECK  Result Date: 02/20/2022 Pacemaker check in clinic. Normal device function. Thresholds, sensing, impedances consistent with previous measurements. Device programmed to maximize longevity. 98% AT/AF burden, with controlled  ventricular rates noted. Device programmed at appropriate  safety margins. Histogram distribution appropriate for patient activity level. Device programmed to optimize intrinsic conduction. Estimated longevity 5.44mhs. Patient enrolled in remote follow-up. Patient education completed.  PROGRAMMING CHANGES PER DR KCaryl Comes Changed mode from DDDR TO VVIR due to patient's chronic afib/flutter. Device initially with 16,970 AMS due to AF.MLeticia Penna RN   Micro Results  Recent Results (from the past 240 hour(s))  Resp panel by RT-PCR (RSV, Flu A&B, Covid) Anterior Nasal Swab     Status: None   Collection Time: 02/28/22  2:52 AM  Specimen: Anterior Nasal Swab  Result Value Ref Range Status   SARS Coronavirus 2 by RT PCR NEGATIVE NEGATIVE Final    Comment: (NOTE) SARS-CoV-2 target nucleic acids are NOT DETECTED.  The SARS-CoV-2 RNA is generally detectable in upper respiratory specimens during the acute phase of infection. The lowest concentration of SARS-CoV-2 viral copies this assay can detect is 138 copies/mL. A negative result does not preclude SARS-Cov-2 infection and should not be used as the sole basis for treatment or other patient management decisions. A negative result may occur with  improper specimen collection/handling, submission of specimen other than nasopharyngeal swab, presence of viral mutation(s) within the areas targeted by this assay, and inadequate number of viral copies(<138 copies/mL). A negative result must be combined with clinical observations, patient history, and epidemiological information. The expected result is Negative.  Fact Sheet for Patients:  EntrepreneurPulse.com.au  Fact Sheet for Healthcare Providers:  IncredibleEmployment.be  This test is no t yet approved or cleared by the [place] FDA and  has been authorized for detection and/or diagnosis of SARS-CoV-2 by FDA under an Emergency Use Authorization (EUA). This  EUA will remain  in effect (meaning this test can be used) for the duration of the COVID-19 declaration under Section 564(b)(1) of the Act, 21 U.S.C.section 360bbb-3(b)(1), unless the authorization is terminated  or revoked sooner.       Influenza A by PCR NEGATIVE NEGATIVE Final   Influenza B by PCR NEGATIVE NEGATIVE Final    Comment: (NOTE) The Xpert Xpress SARS-CoV-2/FLU/RSV plus assay is intended as an aid in the diagnosis of influenza from Nasopharyngeal swab specimens and should not be used as a sole basis for treatment. Nasal washings and aspirates are unacceptable for Xpert Xpress SARS-CoV-2/FLU/RSV testing.  Fact Sheet for Patients: EntrepreneurPulse.com.au  Fact Sheet for Healthcare Providers: IncredibleEmployment.be  This test is not yet approved or cleared by the [place] FDA and has been authorized for detection and/or diagnosis of SARS-CoV-2 by FDA under an Emergency Use Authorization (EUA). This EUA will remain in effect (meaning this test can be used) for the duration of the COVID-19 declaration under Section 564(b)(1) of the Act, 21 U.S.C. section 360bbb-3(b)(1), unless the authorization is terminated or revoked.     Resp Syncytial Virus by PCR NEGATIVE NEGATIVE Final    Comment: (NOTE) Fact Sheet for Patients: EntrepreneurPulse.com.au  Fact Sheet for Healthcare Providers: IncredibleEmployment.be  This test is not yet approved or cleared by the [place] FDA and has been authorized for detection and/or diagnosis of SARS-CoV-2 by FDA under an Emergency Use Authorization (EUA). This EUA will remain in effect (meaning this test can be used) for the duration of the COVID-19 declaration under Section 564(b)(1) of the Act, 21 U.S.C. section 360bbb-3(b)(1), unless the authorization is terminated or revoked.  Performed at [facility], [address], [place], [place] [address]    Blood Culture (routine x 2)     Status: None (Preliminary result)   Collection Time: 02/28/22  3:13 AM   Specimen: BLOOD  Result Value Ref Range Status   Specimen Description BLOOD BLOOD RIGHT HAND  Final   Special Requests   Final    BOTTLES DRAWN AEROBIC AND ANAEROBIC Blood Culture results may not be optimal due to an inadequate volume of blood received in culture bottles   Culture   Final    NO GROWTH 2 DAYS Performed at [facility], [address] [place], [place] [address]    Report Status PENDING  Incomplete  Blood  Culture (routine x 2)     Status: None (Preliminary result)   Collection Time: 02/28/22  3:14 AM   Specimen: BLOOD  Result Value Ref Range Status   Specimen Description BLOOD LEFT ANTECUBITAL  Final   Special Requests   Final    BOTTLES DRAWN AEROBIC AND ANAEROBIC Blood Culture adequate volume   Culture   Final    NO GROWTH 2 DAYS Performed at [facility], [address], [place], [place] [address]    Report Status PENDING  Incomplete    Today   Subjective    Damian Baldassarre today has no new complaints No fever  Or chills   No Nausea, Vomiting or Diarrhea   Patient has been seen and examined prior to discharge   Objective   Blood pressure (!) 147/43, pulse 76, temperature 97.6 F (36.4 C), temperature source Oral, resp. rate 18, weight 68.8 kg, SpO2 100 %.   Intake/Output Summary (Last 24 hours) at 03/02/2022 1104 Last data filed at 03/01/2022 2300 Gross per 24 hour  Intake 220 ml  Output --  Net 220 ml    Exam Gen:- Awake Alert, no acute distress  HEENT:- [place].AT, No sclera icterus Neck-Supple Neck,No JVD,.  Lungs-  CTAB , good air movement bilaterally CV- S1, S2 normal, irregular, pacemaker in situ Abd-  +ve B.Sounds, Abd Soft, No tenderness, no C area tenderness Extremity/Skin:- No  edema,   good pulses Psych-affect is appropriate, oriented x3 Neuro-no new focal deficits, no tremors    Data Review   CBC w Diff:  Lab Results   Component Value Date   WBC 9.9 03/01/2022   HGB 11.9 (L) 03/01/2022   HGB 11.9 09/05/2021   HCT 36.4 03/01/2022   HCT 37.5 09/05/2021   PLT 131 (L) 03/01/2022   PLT 257 09/05/2021   LYMPHOPCT 2 02/28/2022   MONOPCT 1 02/28/2022   EOSPCT 0 02/28/2022   BASOPCT 0 02/28/2022   CMP:  Lab Results  Component Value Date   NA 139 03/01/2022   NA 144 09/05/2021   K 4.4 03/01/2022   CL 109 03/01/2022   CO2 26 03/01/2022   BUN 16 03/01/2022   BUN 14 09/05/2021   CREATININE 0.66 03/01/2022   CREATININE 0.66 03/25/2015   PROT 7.1 02/28/2022   ALBUMIN 3.7 02/28/2022   BILITOT 1.1 02/28/2022   ALKPHOS 72 02/28/2022   AST 31 02/28/2022   ALT 26 02/28/2022   Total Discharge time is about 33 minutes  Roxan Hockey M.D on 03/02/2022 at 11:04 AM  Go to [email or web address] -  for contact info  [facility] - Office  [phone]

## 2022-03-02 NOTE — Progress Notes (Signed)
Pt  noted with increased confusion throughout the night, yelling out "Marla" or "help". Requiring frequent redirection and reorientation by staff. Agitated at times, Turned and repositioned throughout the night.

## 2022-03-05 LAB — CULTURE, BLOOD (ROUTINE X 2)
Culture: NO GROWTH
Culture: NO GROWTH
Special Requests: ADEQUATE

## 2022-03-08 ENCOUNTER — Ambulatory Visit: Payer: Medicare Other | Attending: Family Medicine

## 2022-03-08 ENCOUNTER — Other Ambulatory Visit: Payer: Self-pay

## 2022-03-08 DIAGNOSIS — R200 Anesthesia of skin: Secondary | ICD-10-CM | POA: Insufficient documentation

## 2022-03-08 DIAGNOSIS — R2681 Unsteadiness on feet: Secondary | ICD-10-CM | POA: Diagnosis present

## 2022-03-08 DIAGNOSIS — M6281 Muscle weakness (generalized): Secondary | ICD-10-CM | POA: Insufficient documentation

## 2022-03-08 LAB — CUP PACEART REMOTE DEVICE CHECK
Battery Remaining Longevity: 4 mo
Battery Remaining Percentage: 7 %
Battery Voltage: 2.81 V
Brady Statistic RV Percent Paced: 99 %
Date Time Interrogation Session: 20240226110653
Implantable Lead Connection Status: 753985
Implantable Lead Connection Status: 753985
Implantable Lead Implant Date: 20061229
Implantable Lead Implant Date: 20061229
Implantable Lead Location: 753859
Implantable Lead Location: 753860
Implantable Lead Model: 5076
Implantable Pulse Generator Implant Date: 20170329
Lead Channel Impedance Value: 710 Ohm
Lead Channel Pacing Threshold Amplitude: 1.75 V
Lead Channel Pacing Threshold Pulse Width: 1 ms
Lead Channel Sensing Intrinsic Amplitude: 12 mV
Lead Channel Setting Pacing Amplitude: 4 V
Lead Channel Setting Pacing Pulse Width: 1 ms
Lead Channel Setting Sensing Sensitivity: 6 mV
Pulse Gen Model: 2240
Pulse Gen Serial Number: 7892876

## 2022-03-08 NOTE — Therapy (Signed)
OUTPATIENT PHYSICAL THERAPY LOWER EXTREMITY EVALUATION   Patient Name: Brittany [name] MRN: [id number] DOB:March 07, 1927, 89 y.o., female Today's Date: 03/08/2022  END OF SESSION:  PT End of Session - 03/08/22 0950     Visit Number 1    Number of Visits 6    Date for PT Re-Evaluation 05/05/22    PT Start Time 0950    PT Stop Time 1028    PT Time Calculation (min) 38 min    Equipment Utilized During Treatment Gait belt    Activity Tolerance Patient tolerated treatment well    Behavior During Therapy WFL for tasks assessed/performed             Past Medical History:  Diagnosis Date   Atrial flutter ([place])    CAD (coronary artery disease)    CHF (congestive heart failure) (HCC)    Complete heart block ([place])    a. s/p STJ dual chamber PPM   Depression    Glaucoma    Hyperlipidemia    Hypertension    Type II diabetes mellitus ([place])    Past Surgical History:  Procedure Laterality Date   BREAST BIOPSY  1991   ELECTROPHYSIOLOGIC STUDY N/A 02/12/2015   cardioversion by Dr Caryl Comes    EP IMPLANTABLE DEVICE N/A 04/07/2015   a. STJ dual chamber PPM implanted 2006 for complete heart block b. gen change 2017   FEMUR IM NAIL  01/18/2012   Procedure: INTRAMEDULLARY (IM) NAIL FEMORAL;  Surgeon: Marin Shutter, MD;  Location: [place];  Service: Orthopedics;  Laterality: Left;   left total knee replacement Bilateral 1996   TONSILLECTOMY  1937   Patient Active Problem List   Diagnosis Date Noted   Acute metabolic encephalopathy 0000000   Hypokalemia 02/28/2022   Leg swelling 06/22/2020   Atrial flutter (HCC)-tachycardia 05/06/2020   CHF (congestive heart failure) ([place]) 01/02/2015   DM2 (diabetes mellitus, type 2) ([place]) 01/02/2015   Belching 10/22/2012   SOB (shortness of breath) 10/22/2012   Lower extremity numbness 06/20/2012   Closed left hip fracture ([place]) 01/18/2012   HYPERSOMNIA UNSPECIFIED 06/23/2008   EDEMA 06/23/2008   GLAUCOMA 05/27/2008   LAD-balloon  angioplasty/cutting balloon-2007 05/27/2008   AV BLOCK, COMPLETE 05/27/2008   PACEMAKER, PERMANENT 05/27/2008   HYPERLIPIDEMIA 11/05/2006   Essential hypertension 11/05/2006    PCP: Bernerd Limbo, MD  REFERRING PROVIDER: Roxan Hockey, MD   REFERRING DIAG: Lower extremity numbness; Lower extremity weakness   THERAPY DIAG:  Muscle weakness (generalized)  Unsteadiness on feet  Rationale for Evaluation and Treatment: Rehabilitation  ONSET DATE: 02/28/22  SUBJECTIVE:   SUBJECTIVE STATEMENT: Patient's daughter reports that she had a UTI on 02/28/22 and she could not get up out of bed. EMS had to be called and she was transported to [facility]. She was discharged on 03/02/22. She has been improving since she got home and she is almost back to her prior level before her UTI. She is now able to get around her house better and not having as much trouble getting into or out of bed.   PERTINENT HISTORY: HTN, CHF, DM, glaucoma, depression, hernia, and cognitive deficits PAIN:  Are you having pain? Yes: NPRS scale: 3-4/10 Pain location: left hip Pain description: soreness  PRECAUTIONS: None  WEIGHT BEARING RESTRICTIONS: No  FALLS:  Has patient fallen in last 6 months? No  LIVING ENVIRONMENT: Lives with: lives with their family Lives in: House/apartment Stairs: Yes: External: 2 steps; none Has following equipment at home: Gilford Rile - 2  wheeled  OCCUPATION: retired  PLOF: Independent  PATIENT GOALS: improved strength and mobility  NEXT MD VISIT: none scheduled  OBJECTIVE:   COGNITION: Overall cognitive status: Impaired     SENSATION: Patient's daughter reports that she has neuropathy in her left foot.   LOWER EXTREMITY ROM: unable to assessed to due to cognitive deficits  LOWER EXTREMITY MMT:  MMT Right eval Left eval  Hip flexion Unable to assess Unable to assess  Hip extension    Hip abduction    Hip adduction    Hip internal rotation    Hip  external rotation    Knee flexion Unable to assess Unable to assess  Knee extension 4/5 4/5  Ankle dorsiflexion 3/5 3/5  Ankle plantarflexion    Ankle inversion    Ankle eversion     (Blank rows = not tested)  FUNCTIONAL TESTS:  5 times sit to stand: 50.07 seconds with upper extremity support from armrests  GAIT: Assistive device utilized: Environmental consultant - 2 wheeled Level of assistance: CGA Gait speed: 0.27 m/s  Comments: CGA for safety, decreased gait speed and stride length   TODAY'S TREATMENT:                                                                                                                              DATE:    PATIENT EDUCATION:  Education details: POC, prognosis, and goals for therapy  Person educated: Patient and Child(ren) Education method: Explanation Education comprehension: verbalized understanding  HOME EXERCISE PROGRAM:   ASSESSMENT:  CLINICAL IMPRESSION: Patient is a 89 y.o. female who was seen today for physical therapy evaluation and treatment for lower extremity weakness secondary to a hospital admission on 02/28/22 due to a UTI.  She presented with decreased lower extremity strength and power bilaterally as evidenced by her manual muscle testing and 5 times sit to stand test.  She is also an elevated fall risk as evidenced by her 5 times sit to stand time and her gait speed.  Hip and knee flexor manual muscle testing was unable to be completed as proper testing position was unable to be obtained due to cognitive deficits.  Recommend that she continue with skilled physical therapy to address her impairments to maximize her safety and functional mobility.  OBJECTIVE IMPAIRMENTS: Abnormal gait, decreased balance, decreased cognition, decreased mobility, difficulty walking, decreased ROM, decreased strength, and pain.   ACTIVITY LIMITATIONS: carrying, lifting, standing, stairs, transfers, and locomotion level  PARTICIPATION LIMITATIONS: meal prep and  cleaning  PERSONAL FACTORS: Age, Behavior pattern, Transportation, and 3+ comorbidities: HTN, CHF, DM, glaucoma, depression, hernia, and cognitive deficits  are also affecting patient's functional outcome.   REHAB POTENTIAL: Good  CLINICAL DECISION MAKING: Evolving/moderate complexity  EVALUATION COMPLEXITY: Moderate   GOALS: Goals reviewed with patient? Yes  LONG TERM GOALS: Target date: 03/29/22  Patient will be able to complete her HEP with minimal cueing. Baseline:  Goal status: INITIAL  2.  Patient will improve  her gait speed to at least 0.35 m/s. Baseline:  Goal status: INITIAL  3.  Patient will improve her 5 times sit to stand time to 40 seconds or less for improved lower extremity power. Baseline:  Goal status: INITIAL  PLAN:  PT FREQUENCY: 2x/week  PT DURATION: 3 weeks  PLANNED INTERVENTIONS: Therapeutic exercises, Therapeutic activity, Neuromuscular re-education, Balance training, Gait training, Patient/Family education, Self Care, Stair training, and Re-evaluation  PLAN FOR NEXT SESSION: nustep, lower extremity strengthening, and balance interventions   Darlin Coco, PT 03/08/2022, 2:13 PM

## 2022-03-14 ENCOUNTER — Ambulatory Visit: Payer: Medicare Other | Attending: Family Medicine | Admitting: *Deleted

## 2022-03-14 ENCOUNTER — Encounter: Payer: Self-pay | Admitting: *Deleted

## 2022-03-14 DIAGNOSIS — R2681 Unsteadiness on feet: Secondary | ICD-10-CM | POA: Insufficient documentation

## 2022-03-14 DIAGNOSIS — M6281 Muscle weakness (generalized): Secondary | ICD-10-CM | POA: Insufficient documentation

## 2022-03-14 NOTE — Therapy (Signed)
OUTPATIENT PHYSICAL THERAPY LOWER EXTREMITY EVALUATION   Patient Name: Brittany [name] MRN: [id number] DOB:Mar 14, 1927, 89 y.o., female Today's Date: 03/14/2022  END OF SESSION:  PT End of Session - 03/14/22 1300     Visit Number 2    Number of Visits 6    Date for PT Re-Evaluation 05/05/22    PT Start Time 22    PT Stop Time 1346    PT Time Calculation (min) 46 min             Past Medical History:  Diagnosis Date   Atrial flutter ([place])    CAD (coronary artery disease)    CHF (congestive heart failure) (HCC)    Complete heart block ([place])    a. s/p STJ dual chamber PPM   Depression    Glaucoma    Hyperlipidemia    Hypertension    Type II diabetes mellitus ([place])    Past Surgical History:  Procedure Laterality Date   BREAST BIOPSY  1991   ELECTROPHYSIOLOGIC STUDY N/A 02/12/2015   cardioversion by Dr Caryl Comes    EP IMPLANTABLE DEVICE N/A 04/07/2015   a. STJ dual chamber PPM implanted 2006 for complete heart block b. gen change 2017   FEMUR IM NAIL  01/18/2012   Procedure: INTRAMEDULLARY (IM) NAIL FEMORAL;  Surgeon: Marin Shutter, MD;  Location: [facility];  Service: Orthopedics;  Laterality: Left;   left total knee replacement Bilateral 1996   TONSILLECTOMY  1937   Patient Active Problem List   Diagnosis Date Noted   Acute metabolic encephalopathy 0000000   Hypokalemia 02/28/2022   Leg swelling 06/22/2020   Atrial flutter (HCC)-tachycardia 05/06/2020   CHF (congestive heart failure) ([place]) 01/02/2015   DM2 (diabetes mellitus, type 2) ([place]) 01/02/2015   Belching 10/22/2012   SOB (shortness of breath) 10/22/2012   Lower extremity numbness 06/20/2012   Closed left hip fracture ([place]) 01/18/2012   HYPERSOMNIA UNSPECIFIED 06/23/2008   EDEMA 06/23/2008   GLAUCOMA 05/27/2008   LAD-balloon angioplasty/cutting balloon-2007 05/27/2008   AV BLOCK, COMPLETE 05/27/2008   PACEMAKER, PERMANENT 05/27/2008   HYPERLIPIDEMIA 11/05/2006   Essential hypertension 11/05/2006     PCP: Bernerd Limbo, MD  REFERRING PROVIDER: Roxan Hockey, MD   REFERRING DIAG: Lower extremity numbness; Lower extremity weakness   THERAPY DIAG:  Muscle weakness (generalized)  Unsteadiness on feet  Rationale for Evaluation and Treatment: Rehabilitation  ONSET DATE: 02/28/22  SUBJECTIVE:   SUBJECTIVE STATEMENT: Patient's daughter reports that she had a UTI on 02/28/22 and she could not get up out of bed.  Doing better today with energy. Wearing Girdle to help with her hernia pain     PERTINENT HISTORY: HTN, CHF, DM, glaucoma, depression, hernia, and cognitive deficits PAIN:  Are you having pain? Yes: NPRS scale: 3-4/10 Pain location: left hip Pain description: soreness  PRECAUTIONS: None  WEIGHT BEARING RESTRICTIONS: No  FALLS:  Has patient fallen in last 6 months? No  LIVING ENVIRONMENT: Lives with: lives with their family Lives in: House/apartment Stairs: Yes: External: 2 steps; none Has following equipment at home: Gilford Rile - 2 wheeled  OCCUPATION: retired  PLOF: Independent  PATIENT GOALS: improved strength and mobility  NEXT MD VISIT: none scheduled  OBJECTIVE:      TODAY'S TREATMENT:  DATE:                                                                                                                          03-14-22                                    EXERCISE LOG      Use soft back chair  Exercise Repetitions and Resistance Comments  Nustep X 10 mins, L1 seat 8   Seated:  marching 3x10   Ball squeeze 3x10    hold ball for Pt   clam Red tband 3x10   Sit to stand X3 with UE assist   HS curl Red 3x10 each LE   LAQs 2# 3x10 each LE    Blank cell = exercise not performed today       COGNITION: Overall cognitive status: Impaired     SENSATION: Patient's daughter reports that she has neuropathy in her left foot.    LOWER EXTREMITY ROM: unable to assessed to due to cognitive deficits  LOWER EXTREMITY MMT:  MMT Right eval Left eval  Hip flexion Unable to assess Unable to assess  Hip extension    Hip abduction    Hip adduction    Hip internal rotation    Hip external rotation    Knee flexion Unable to assess Unable to assess  Knee extension 4/5 4/5  Ankle dorsiflexion 3/5 3/5  Ankle plantarflexion    Ankle inversion    Ankle eversion     (Blank rows = not tested)  FUNCTIONAL TESTS:  5 times sit to stand: 50.07 seconds with upper extremity support from armrests  GAIT: Assistive device utilized: Environmental consultant - 2 wheeled Level of assistance: CGA Gait speed: 0.27 m/s  Comments: CGA for safety, decreased gait speed and stride length    PATIENT EDUCATION:  Education details: POC, prognosis, and goals for therapy  Person educated: Patient and Child(ren) Education method: Explanation Education comprehension: verbalized understanding  HOME EXERCISE PROGRAM:   ASSESSMENT:  CLINICAL IMPRESSION:  Pt arrived today in good spirits. She was able to perform seated LE strengthening exs [facility] and [facility] and did very well. Pt does report having back pain so a soft back chair is needed.    OBJECTIVE IMPAIRMENTS: Abnormal gait, decreased balance, decreased cognition, decreased mobility, difficulty walking, decreased ROM, decreased strength, and pain.   ACTIVITY LIMITATIONS: carrying, lifting, standing, stairs, transfers, and locomotion level  PARTICIPATION LIMITATIONS: meal prep and cleaning  PERSONAL FACTORS: Age, Behavior pattern, Transportation, and 3+ comorbidities: HTN, CHF, DM, glaucoma, depression, hernia, and cognitive deficits  are also affecting patient's functional outcome.   REHAB POTENTIAL: Good  CLINICAL DECISION MAKING: Evolving/moderate complexity  EVALUATION COMPLEXITY: Moderate   GOALS: Goals reviewed with patient? Yes  LONG TERM GOALS: Target date: 03/29/22  Patient will be  able to complete her HEP with minimal cueing. Baseline:  Goal status:  INITIAL  2.  Patient will improve her gait speed to at least 0.35 m/s. Baseline:  Goal status: INITIAL  3.  Patient will improve her 5 times sit to stand time to 40 seconds or less for improved lower extremity power. Baseline:  Goal status: INITIAL  PLAN:  PT FREQUENCY: 2x/week  PT DURATION: 3 weeks  PLANNED INTERVENTIONS: Therapeutic exercises, Therapeutic activity, Neuromuscular re-education, Balance training, Gait training, Patient/Family education, Self Care, Stair training, and Re-evaluation  PLAN FOR NEXT SESSION: nustep, lower extremity strengthening, and balance interventions   Derral Colucci,CHRIS, PTA 03/14/2022, 1:59 PM

## 2022-03-16 ENCOUNTER — Ambulatory Visit: Payer: Medicare Other

## 2022-03-16 DIAGNOSIS — K4030 Unilateral inguinal hernia, with obstruction, without gangrene, not specified as recurrent: Secondary | ICD-10-CM | POA: Diagnosis not present

## 2022-03-16 DIAGNOSIS — R2681 Unsteadiness on feet: Secondary | ICD-10-CM

## 2022-03-16 DIAGNOSIS — M6281 Muscle weakness (generalized): Secondary | ICD-10-CM

## 2022-03-16 NOTE — Therapy (Addendum)
OUTPATIENT PHYSICAL THERAPY LOWER EXTREMITY TREATMENT   Patient Name: Brittany [name] MRN: [id number] DOB:12/26/1927, 89 y.o., female Today's Date: 03/16/2022  END OF SESSION:  PT End of Session - 03/16/22 1605     Visit Number 3    Number of Visits 6    Date for PT Re-Evaluation 05/05/22    PT Start Time 1600    PT Stop Time 1645    PT Time Calculation (min) 45 min    Activity Tolerance Patient tolerated treatment well    Behavior During Therapy [facility] for tasks assessed/performed             Past Medical History:  Diagnosis Date   Atrial flutter (HCC)    CAD (coronary artery disease)    CHF (congestive heart failure) (H[facility])    Complete heart block (H[facility])    a. s/p STJ dual chamber PPM   Depression    Glaucoma    Hyperlipidemia    Hypertension    Type II diabetes mellitus (H[facility])    Past Surgical History:  Procedure Laterality Date   BREAST BIOPSY  1991   ELECTROPHYSIOLOGIC STUDY N/A 02/12/2015   cardioversion by Dr Graciela Husbands    EP IMPLANTABLE DEVICE N/A 04/07/2015   a. STJ dual chamber PPM implanted 2006 for complete heart block b. gen change 2017   FEMUR IM NAIL  01/18/2012   Procedure: INTRAMEDULLARY (IM) NAIL FEMORAL;  Surgeon: Senaida Lange, MD;  Location: [facility];  Service: Orthopedics;  Laterality: Left;   left total knee replacement Bilateral 1996   TONSILLECTOMY  1937   Patient Active Problem List   Diagnosis Date Noted   Acute metabolic encephalopathy 02/28/2022   Hypokalemia 02/28/2022   Leg swelling 06/22/2020   Atrial flutter (HCC)-tachycardia 05/06/2020   CHF (congestive heart failure) (HCC) 01/02/2015   DM2 (diabetes mellitus, type 2) (HCC) 01/02/2015   Belching 10/22/2012   SOB (shortness of breath) 10/22/2012   Lower extremity numbness 06/20/2012   Closed left hip fracture (HCC) 01/18/2012   HYPERSOMNIA UNSPECIFIED 06/23/2008   EDEMA 06/23/2008   GLAUCOMA 05/27/2008   LAD-balloon angioplasty/cutting balloon-2007 05/27/2008   AV BLOCK, COMPLETE  05/27/2008   PACEMAKER, PERMANENT 05/27/2008   HYPERLIPIDEMIA 11/05/2006   Essential hypertension 11/05/2006    PCP: Tracey Harries, MD  REFERRING PROVIDER: Shon Hale, MD   REFERRING DIAG: Lower extremity numbness; Lower extremity weakness   THERAPY DIAG:  Muscle weakness (generalized)  Unsteadiness on feet  Rationale for Evaluation and Treatment: Rehabilitation  ONSET DATE: 02/28/22  SUBJECTIVE:   SUBJECTIVE STATEMENT: Patient reports that her back is hurting her a little more today.   PERTINENT HISTORY: HTN, CHF, DM, glaucoma, depression, hernia, and cognitive deficits PAIN:  Are you having pain? Yes: NPRS scale: 3-4/10 Pain location: left hip Pain description: soreness  PRECAUTIONS: None  WEIGHT BEARING RESTRICTIONS: No  FALLS:  Has patient fallen in last 6 months? No  LIVING ENVIRONMENT: Lives with: lives with their family Lives in: House/apartment Stairs: Yes: External: 2 steps; none Has following equipment at home: Environmental consultant - 2 wheeled  OCCUPATION: retired  PLOF: Independent  PATIENT GOALS: improved strength and mobility  NEXT MD VISIT: none scheduled  OBJECTIVE:      COGNITION: Overall cognitive status: Impaired     SENSATION: Patient's daughter reports that she has neuropathy in her left foot.   LOWER EXTREMITY ROM: unable to assessed to due to cognitive deficits  LOWER EXTREMITY MMT:  MMT Right eval Left eval  Hip flexion Unable to  assess Unable to assess  Hip extension    Hip abduction    Hip adduction    Hip internal rotation    Hip external rotation    Knee flexion Unable to assess Unable to assess  Knee extension 4/5 4/5  Ankle dorsiflexion 3/5 3/5  Ankle plantarflexion    Ankle inversion    Ankle eversion     (Blank rows = not tested)  FUNCTIONAL TESTS:  5 times sit to stand: 50.07 seconds with upper extremity support from armrests  GAIT: Assistive device utilized: Environmental consultant - 2 wheeled Level of assistance: CGA Gait  speed: 0.27 m/s  Comments: CGA for safety, decreased gait speed and stride length  TODAY'S TREATMENT:                                                                                                                              DATE:                                     3/7 EXERCISE LOG  Exercise Repetitions and Resistance Comments  Nustep  L3 x 5 minutes; L1 x 7 minutes   LAQ 2# x 3 x10 reps    Seated heel raise  2# x 20 reps    Seated marching  2# x 15 reps    Toe taps on step  6" step x 20 reps    Seated clams  Red t-band x 3 x 10 reps    Sit to stand  4 reps  With UE support from armrests   Blank cell = exercise not performed today                                                                                                          03-14-22                    EXERCISE LOG      Use soft back chair  Exercise Repetitions and Resistance Comments  Nustep X 10 mins, L1 seat 8   Seated:  marching 3x10   Ball squeeze 3x10    hold ball for Pt   clam Red tband 3x10   Sit to stand X3 with UE assist   HS curl Red 3x10 each LE   LAQs 2# 3x10 each LE    Blank cell = exercise not performed today   PATIENT EDUCATION:  Education details: POC, prognosis, and  goals for therapy  Person educated: Patient and Child(ren) Education method: Explanation Education comprehension: verbalized understanding  HOME EXERCISE PROGRAM:   ASSESSMENT:  CLINICAL IMPRESSION:   Patient was introduced to toe taps on steps for improved posture and awareness while walking. She required minimal cueing with this intervention for improved awareness of her surroundings to prevent her from looking down at the floor. She reported feeling good upon the conclusion of treatment. She continues to require skilled physical therapy to address her remaining impairments to return to her prior level of function.   PHYSICAL THERAPY DISCHARGE SUMMARY  Visits from Start of Care: 3  Current functional level related to goals /  functional outcomes: Patient was unable to meet her goals for skilled physical therapy as she did not complete her recommended plan of care.    Remaining deficits: Gait speed and lower extremity power   Education / Equipment: HEP   Patient agrees to discharge. Patient goals were not met. Patient is being discharged due to not returning since the last visit.  Candi Leash, PT, DPT    OBJECTIVE IMPAIRMENTS: Abnormal gait, decreased balance, decreased cognition, decreased mobility, difficulty walking, decreased ROM, decreased strength, and pain.   ACTIVITY LIMITATIONS: carrying, lifting, standing, stairs, transfers, and locomotion level  PARTICIPATION LIMITATIONS: meal prep and cleaning  PERSONAL FACTORS: Age, Behavior pattern, Transportation, and 3+ comorbidities: HTN, CHF, DM, glaucoma, depression, hernia, and cognitive deficits  are also affecting patient's functional outcome.   REHAB POTENTIAL: Good  CLINICAL DECISION MAKING: Evolving/moderate complexity  EVALUATION COMPLEXITY: Moderate   GOALS: Goals reviewed with patient? Yes  LONG TERM GOALS: Target date: 03/29/22  Patient will be able to complete her HEP with minimal cueing. Baseline:  Goal status: INITIAL  2.  Patient will improve her gait speed to at least 0.35 m/s. Baseline:  Goal status: INITIAL  3.  Patient will improve her 5 times sit to stand time to 40 seconds or less for improved lower extremity power. Baseline:  Goal status: INITIAL  PLAN:  PT FREQUENCY: 2x/week  PT DURATION: 3 weeks  PLANNED INTERVENTIONS: Therapeutic exercises, Therapeutic activity, Neuromuscular re-education, Balance training, Gait training, Patient/Family education, Self Care, Stair training, and Re-evaluation  PLAN FOR NEXT SESSION: nustep, lower extremity strengthening, and balance interventions   Brittany [name], PT 03/16/2022, 5:57 PM

## 2022-03-19 ENCOUNTER — Inpatient Hospital Stay (HOSPITAL_COMMUNITY)
Admission: EM | Admit: 2022-03-19 | Discharge: 2022-03-24 | DRG: 351 | Disposition: A | Discharge status: Skilled Nursing Facility | Payer: Medicare Other | Attending: Internal Medicine | Admitting: Internal Medicine

## 2022-03-19 ENCOUNTER — Emergency Department (HOSPITAL_COMMUNITY): Payer: Medicare Other

## 2022-03-19 ENCOUNTER — Other Ambulatory Visit: Payer: Self-pay

## 2022-03-19 DIAGNOSIS — Z23 Encounter for immunization: Secondary | ICD-10-CM | POA: Diagnosis not present

## 2022-03-19 DIAGNOSIS — K5900 Constipation, unspecified: Secondary | ICD-10-CM | POA: Insufficient documentation

## 2022-03-19 DIAGNOSIS — I509 Heart failure, unspecified: Secondary | ICD-10-CM | POA: Diagnosis not present

## 2022-03-19 DIAGNOSIS — I4892 Unspecified atrial flutter: Secondary | ICD-10-CM | POA: Diagnosis present

## 2022-03-19 DIAGNOSIS — I484 Atypical atrial flutter: Secondary | ICD-10-CM | POA: Diagnosis not present

## 2022-03-19 DIAGNOSIS — E11649 Type 2 diabetes mellitus with hypoglycemia without coma: Secondary | ICD-10-CM | POA: Diagnosis not present

## 2022-03-19 DIAGNOSIS — Z950 Presence of cardiac pacemaker: Secondary | ICD-10-CM | POA: Diagnosis not present

## 2022-03-19 DIAGNOSIS — Z888 Allergy status to other drugs, medicaments and biological substances status: Secondary | ICD-10-CM

## 2022-03-19 DIAGNOSIS — R9431 Abnormal electrocardiogram [ECG] [EKG]: Secondary | ICD-10-CM | POA: Diagnosis not present

## 2022-03-19 DIAGNOSIS — Z7984 Long term (current) use of oral hypoglycemic drugs: Secondary | ICD-10-CM | POA: Diagnosis not present

## 2022-03-19 DIAGNOSIS — Z01810 Encounter for preprocedural cardiovascular examination: Secondary | ICD-10-CM | POA: Diagnosis not present

## 2022-03-19 DIAGNOSIS — I447 Left bundle-branch block, unspecified: Secondary | ICD-10-CM | POA: Diagnosis not present

## 2022-03-19 DIAGNOSIS — F0390 Unspecified dementia without behavioral disturbance: Secondary | ICD-10-CM | POA: Diagnosis present

## 2022-03-19 DIAGNOSIS — Z8249 Family history of ischemic heart disease and other diseases of the circulatory system: Secondary | ICD-10-CM | POA: Diagnosis not present

## 2022-03-19 DIAGNOSIS — Z96653 Presence of artificial knee joint, bilateral: Secondary | ICD-10-CM | POA: Diagnosis present

## 2022-03-19 DIAGNOSIS — E669 Obesity, unspecified: Secondary | ICD-10-CM | POA: Insufficient documentation

## 2022-03-19 DIAGNOSIS — Z6830 Body mass index (BMI) 30.0-30.9, adult: Secondary | ICD-10-CM

## 2022-03-19 DIAGNOSIS — Z823 Family history of stroke: Secondary | ICD-10-CM | POA: Diagnosis not present

## 2022-03-19 DIAGNOSIS — Z79899 Other long term (current) drug therapy: Secondary | ICD-10-CM | POA: Diagnosis not present

## 2022-03-19 DIAGNOSIS — I442 Atrioventricular block, complete: Secondary | ICD-10-CM | POA: Diagnosis present

## 2022-03-19 DIAGNOSIS — E785 Hyperlipidemia, unspecified: Secondary | ICD-10-CM | POA: Diagnosis present

## 2022-03-19 DIAGNOSIS — Z66 Do not resuscitate: Secondary | ICD-10-CM | POA: Diagnosis present

## 2022-03-19 DIAGNOSIS — Z87891 Personal history of nicotine dependence: Secondary | ICD-10-CM

## 2022-03-19 DIAGNOSIS — K4030 Unilateral inguinal hernia, with obstruction, without gangrene, not specified as recurrent: Secondary | ICD-10-CM | POA: Diagnosis present

## 2022-03-19 DIAGNOSIS — I2510 Atherosclerotic heart disease of native coronary artery without angina pectoris: Secondary | ICD-10-CM | POA: Diagnosis present

## 2022-03-19 DIAGNOSIS — K4090 Unilateral inguinal hernia, without obstruction or gangrene, not specified as recurrent: Secondary | ICD-10-CM | POA: Diagnosis not present

## 2022-03-19 DIAGNOSIS — Z833 Family history of diabetes mellitus: Secondary | ICD-10-CM | POA: Diagnosis not present

## 2022-03-19 DIAGNOSIS — I110 Hypertensive heart disease with heart failure: Secondary | ICD-10-CM | POA: Diagnosis present

## 2022-03-19 DIAGNOSIS — I272 Pulmonary hypertension, unspecified: Secondary | ICD-10-CM | POA: Diagnosis present

## 2022-03-19 DIAGNOSIS — E876 Hypokalemia: Secondary | ICD-10-CM | POA: Diagnosis present

## 2022-03-19 DIAGNOSIS — E872 Acidosis, unspecified: Secondary | ICD-10-CM | POA: Insufficient documentation

## 2022-03-19 DIAGNOSIS — I5032 Chronic diastolic (congestive) heart failure: Secondary | ICD-10-CM | POA: Diagnosis present

## 2022-03-19 DIAGNOSIS — I10 Essential (primary) hypertension: Secondary | ICD-10-CM

## 2022-03-19 DIAGNOSIS — Z7901 Long term (current) use of anticoagulants: Secondary | ICD-10-CM

## 2022-03-19 DIAGNOSIS — E1165 Type 2 diabetes mellitus with hyperglycemia: Secondary | ICD-10-CM | POA: Diagnosis present

## 2022-03-19 DIAGNOSIS — Z881 Allergy status to other antibiotic agents status: Secondary | ICD-10-CM

## 2022-03-19 DIAGNOSIS — I351 Nonrheumatic aortic (valve) insufficiency: Secondary | ICD-10-CM | POA: Diagnosis not present

## 2022-03-19 LAB — COMPREHENSIVE METABOLIC PANEL
ALT: 18 U/L (ref 0–44)
AST: 22 U/L (ref 15–41)
Albumin: 3.7 g/dL (ref 3.5–5.0)
Alkaline Phosphatase: 64 U/L (ref 38–126)
Anion gap: 6 (ref 5–15)
BUN: 10 mg/dL (ref 8–23)
CO2: 25 mmol/L (ref 22–32)
Calcium: 9.8 mg/dL (ref 8.9–10.3)
Chloride: 104 mmol/L (ref 98–111)
Creatinine, Ser: 0.69 mg/dL (ref 0.44–1.00)
GFR, Estimated: >60 mL/min (ref >60)
Glucose, Bld: 137 mg/dL — ABNORMAL HIGH (ref 70–99)
Potassium: 3.1 mmol/L — ABNORMAL LOW (ref 3.5–5.1)
Sodium: 135 mmol/L (ref 135–145)
Total Bilirubin: 0.8 mg/dL (ref 0.3–1.2)
Total Protein: 7.1 g/dL (ref 6.5–8.1)

## 2022-03-19 LAB — CBC WITH DIFFERENTIAL/PLATELET
Abs Immature Granulocytes: 0.03 10*3/uL (ref 0.00–0.07)
Basophils Absolute: 0 10*3/uL (ref 0.0–0.1)
Basophils Relative: 1 %
Eosinophils Absolute: 0.1 10*3/uL (ref 0.0–0.5)
Eosinophils Relative: 2 %
HCT: 36.8 % (ref 36.0–46.0)
Hemoglobin: 12.1 g/dL (ref 12.0–15.0)
Immature Granulocytes: 1 %
Lymphocytes Relative: 29 %
Lymphs Abs: 1.9 10*3/uL (ref 0.7–4.0)
MCH: 29.7 pg (ref 26.0–34.0)
MCHC: 32.9 g/dL (ref 30.0–36.0)
MCV: 90.4 fL (ref 80.0–100.0)
Monocytes Absolute: 0.6 10*3/uL (ref 0.1–1.0)
Monocytes Relative: 9 %
Neutro Abs: 3.9 10*3/uL (ref 1.7–7.7)
Neutrophils Relative %: 58 %
Platelets: 266 10*3/uL (ref 150–400)
RBC: 4.07 MIL/uL (ref 3.87–5.11)
RDW: 14.2 % (ref 11.5–15.5)
WBC: 6.5 10*3/uL (ref 4.0–10.5)
nRBC: 0 % (ref 0.0–0.2)

## 2022-03-19 LAB — LACTIC ACID, PLASMA
Lactic Acid, Venous: 2.2 mmol/L (ref 0.5–1.9)
Lactic Acid, Venous: 1.5 mmol/L (ref 0.5–1.9)

## 2022-03-19 LAB — GLUCOSE, CAPILLARY: Glucose-Capillary: 144 mg/dL — ABNORMAL HIGH (ref 70–99)

## 2022-03-19 MED ORDER — INSULIN ASPART 100 UNIT/ML IJ SOLN
0.0000 [IU] | INTRAMUSCULAR | Status: DC
  Administered 2022-03-19 – 2022-03-20 (×2): 1 [IU] via SUBCUTANEOUS
  Administered 2022-03-21: 2 [IU] via SUBCUTANEOUS
  Administered 2022-03-22 – 2022-03-24 (×5): 1 [IU] via SUBCUTANEOUS

## 2022-03-19 MED ORDER — HYDROMORPHONE HCL 1 MG/ML IJ SOLN
0.5000 mg | Freq: Once | INTRAMUSCULAR | Status: AC
  Administered 2022-03-19: 0.5 mg via INTRAVENOUS
  Filled 2022-03-19: qty 0.5

## 2022-03-19 MED ORDER — HYDROMORPHONE HCL 1 MG/ML IJ SOLN
INTRAMUSCULAR | Status: AC
  Filled 2022-03-19: qty 1

## 2022-03-19 MED ORDER — SODIUM CHLORIDE 0.9 % IV BOLUS
1000.0000 mL | Freq: Once | INTRAVENOUS | Status: AC
  Administered 2022-03-19: 1000 mL via INTRAVENOUS

## 2022-03-19 MED ORDER — SENNOSIDES-DOCUSATE SODIUM 8.6-50 MG PO TABS
2.0000 | ORAL_TABLET | Freq: Two times a day (BID) | ORAL | Status: DC
  Administered 2022-03-20 – 2022-03-24 (×7): 2 via ORAL
  Filled 2022-03-19 (×8): qty 2

## 2022-03-19 MED ORDER — IOHEXOL 300 MG/ML  SOLN
100.0000 mL | Freq: Once | INTRAMUSCULAR | Status: AC | PRN
  Administered 2022-03-19: 100 mL via INTRAVENOUS

## 2022-03-19 MED ORDER — PROCHLORPERAZINE EDISYLATE 10 MG/2ML IJ SOLN: 10.0000 mg | Freq: Four times a day (QID) | INTRAMUSCULAR | Status: DC | PRN

## 2022-03-19 MED ORDER — ACETAMINOPHEN 650 MG RE SUPP: 650.0000 mg | Freq: Four times a day (QID) | RECTAL | Status: DC | PRN

## 2022-03-19 MED ORDER — POTASSIUM CHLORIDE CRYS ER 20 MEQ PO TBCR
40.0000 meq | EXTENDED_RELEASE_TABLET | Freq: Once | ORAL | Status: AC
  Administered 2022-03-19: 40 meq via ORAL
  Filled 2022-03-19: qty 2

## 2022-03-19 MED ORDER — FENTANYL CITRATE PF 50 MCG/ML IJ SOSY
50.0000 ug | PREFILLED_SYRINGE | Freq: Once | INTRAMUSCULAR | Status: AC
  Administered 2022-03-19: 50 ug via INTRAVENOUS
  Filled 2022-03-19: qty 1

## 2022-03-19 MED ORDER — HYDROMORPHONE HCL 1 MG/ML IJ SOLN: 0.5000 mg | INTRAMUSCULAR | Status: DC | PRN

## 2022-03-19 MED ORDER — HYDROMORPHONE HCL 1 MG/ML IJ SOLN
INTRAMUSCULAR | Status: AC
  Administered 2022-03-19: 1 mg via INTRAVENOUS
  Filled 2022-03-19: qty 1

## 2022-03-19 MED ORDER — HYDROMORPHONE HCL 1 MG/ML IJ SOLN
1.0000 mg | Freq: Once | INTRAMUSCULAR | Status: AC
  Administered 2022-03-19: 1 mg via INTRAVENOUS

## 2022-03-19 MED ORDER — ACETAMINOPHEN 325 MG PO TABS: 650.0000 mg | ORAL_TABLET | Freq: Four times a day (QID) | ORAL | Status: DC | PRN

## 2022-03-19 MED ORDER — HYDROMORPHONE HCL 1 MG/ML IJ SOLN: 1.0000 mg | Freq: Once | INTRAMUSCULAR | Status: AC

## 2022-03-19 MED ORDER — IRBESARTAN 75 MG PO TABS
35.5000 mg | ORAL_TABLET | Freq: Every day | ORAL | Status: DC
  Administered 2022-03-20: 37.5 mg via ORAL
  Filled 2022-03-19: qty 1

## 2022-03-19 MED ORDER — SODIUM CHLORIDE 0.9 % IV SOLN: INTRAVENOUS | Status: AC

## 2022-03-19 NOTE — H&P (Addendum)
History and Physical    Patient: Brittany [name] [id number] DOB: 20-Apr-1927 DOA: 03/19/2022 DOS: the patient was seen and examined on 03/19/2022 PCP: Bernerd Limbo, MD  Patient coming from: Home  Chief Complaint:  Chief Complaint  Patient presents with   Hernia   HPI: Brittany [name] is a 89 y.o. female with medical history significant of hypertension, hyperlipidemia, history of atrial flutter, complete heart block status post pacemaker implantation, type 2 diabetes mellitus, chronic diastolic heart failure who presented to the emergency department from home via EMS due to pain in the right groin area.  Patient has a history of right inguinal hernia which has been ongoing for about the ED and was usually reducible when she lays down in the past, she weighs a belly binder which helps with prevention of protrusion of the hernia.  However, she presented with right groin pain this morning with difficulty in being able to reduce the hernia.  EMS was activated and patient was taken to the ED for further evaluation and management.  Patient has had a bowel movement this morning, she took Eliquis this morning and she denies chest pain, shortness of breath, nausea, vomiting.  ED Course:  In the emergency department, patient was hemodynamically stable.  Workup in the ED showed normal CBC and BMP except for hypokalemia and CBG of 137, lactic acid 2.2. CT abdomen and pelvis with contrast showed incarcerated right inguinal hernia limits physical closed-loop obstruction.  The ileocecal valve and base of the appendix are also located within the hernia. Findings concerning for ischemia.  Colonic diverticulosis with no acute diverticulitis.  Cholelithiasis with no CT evidence of acute cholecystitis.  Multiple fluid density pancreatic lesions measuring up to 2.6 cm. 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 CT pelvis without contrast showed: 1. Decreased size of the right inguinal hernia which contains fat and fluid. No  bowel loops are present in the right inguinal hernia. 2. Decreased distention and wall thickening of the cecum after reduction of the right inguinal hernia. The ileocecal valve and appendix are no longer within the hernia. ED physician was unable to reduce the hernia, general surgery (Dr. Constance Haw) was consulted, hernia was reduced as shown in reported CT of pelvis above.  Admission by hospitalist overnight with patient being placed n.p.o. at midnight and preop eval by cardiologist in the morning was recommended.  Review of Systems: Review of systems as noted in the HPI. All other systems reviewed and are negative.   Past Medical History:  Diagnosis Date   Atrial flutter ([place])    CAD (coronary artery disease)    CHF (congestive heart failure) (HCC)    Complete heart block ([place])    a. s/p STJ dual chamber PPM   Depression    Glaucoma    Hyperlipidemia    Hypertension    Type II diabetes mellitus ([place])    Past Surgical History:  Procedure Laterality Date   BREAST BIOPSY  1991   ELECTROPHYSIOLOGIC STUDY N/A 02/12/2015   cardioversion by Dr Caryl Comes    EP IMPLANTABLE DEVICE N/A 04/07/2015   a. STJ dual chamber PPM implanted 2006 for complete heart block b. gen change 2017   FEMUR IM NAIL  01/18/2012   Procedure: INTRAMEDULLARY (IM) NAIL FEMORAL;  Surgeon: Marin Shutter, MD;  Location: [facility];  Service: Orthopedics;  Laterality: Left;   left total knee replacement Bilateral 1996   TONSILLECTOMY  1937    Social History:  reports that she quit smoking about 71 years ago. Her  smoking use included cigarettes. She has never used smokeless tobacco. She reports current alcohol use. She reports that she does not use drugs.   Allergies  Allergen Reactions   Ciprofloxacin Other (See Comments)    Severe chest pains   Ace Inhibitors Other (See Comments)    REACTION: cough and sore throat   Cedax [Ceftibuten] Other (See Comments)    Possibly nausea/vomiting, cyanosis, severe headaches    Lipitor [Atorvastatin] Other (See Comments)    REACTION: Myalgia   Plavix [Clopidogrel Bisulfate] Other (See Comments)    Possibly nausea/vomiting, cyanosis, severe headaches     Statins Other (See Comments)    Myalgias, cyanosis    Family History  Problem Relation Age of Onset   Diabetes Mother    Heart attack Father    Stroke Father    Hypertension Father    Kidney disease Daughter      Prior to Admission medications   Medication Sig Start Date End Date Taking? Authorizing Provider  acetaminophen (TYLENOL) 325 MG tablet Take 650 mg by mouth as needed.    [provider]  cyanocobalamin 1000 MCG tablet Take by mouth daily.    [provider]  ELIQUIS 5 MG TABS tablet TAKE 1 TABLET TWICE A DAY Patient taking differently: Take 5 mg by mouth 2 (two) times daily. 09/05/17   Deboraha Sprang, MD  furosemide (LASIX) 40 MG tablet Take 1 tablet (40 mg total) by mouth daily. 03/02/22   Roxan Hockey, MD  glucose blood test strip USE FOR TESTING ONCE DAILY 08/07/16   [provider]  irbesartan (AVAPRO) 75 MG tablet Take 0.5 tablets (37.5 mg total) by mouth daily. 04/12/17   Chanetta Marshall K, NP  isosorbide mononitrate (IMDUR) 30 MG 24 hr tablet Take 1 tablet by mouth daily. 09/04/18   [provider]  metFORMIN (GLUCOPHAGE-XR) 500 MG 24 hr tablet Take 500 mg by mouth daily with breakfast. 12/10/14   [provider]  Multiple Vitamin (MULTIVITAMIN) tablet Take 1 tablet by mouth daily. CENTRUM SILVER    [provider]  Jonetta Speak LANCETS [id number] [place]  11/06/16   [provider]  potassium chloride 20 MEQ TBCR Take 20 mEq by mouth daily. 01/05/15   Thurnell Lose, MD  senna-docusate (SENOKOT-S) 8.6-50 MG tablet Take 2 tablets by mouth 2 (two) times daily. 03/02/22 03/02/23  Roxan Hockey, MD    Physical Exam: BP (!) 149/60 (BP Location: Left Arm)   Pulse 70   Temp 98 F (36.7 C) (Oral)   Resp 18   Ht '4\' 11"'$  (1.499 m)   Wt  66.5 kg   SpO2 96%   BMI 29.61 kg/m   General: 89 y.o. year-old female ill-appearing but in no acute distress.  Alert and oriented x3. HEENT: NCAT, EOMI Neck: Supple, trachea medial Cardiovascular: Regular rate and rhythm with no rubs or gallops.  No thyromegaly or JVD noted.  No lower extremity edema. 2/4 pulses in all 4 extremities. Respiratory: Clear to auscultation with no wheezes or rales. Good inspiratory effort. Abdomen: Soft, nontender nondistended with normal bowel sounds x4 quadrants. Muskuloskeletal: No cyanosis, clubbing or edema noted bilaterally Neuro: CN II-XII intact, strength 5/5 x 4, sensation, reflexes intact Skin: No ulcerative lesions noted or rashes Psychiatry: Mood is appropriate for condition and setting          Labs on Admission:  Basic Metabolic Panel: Recent Labs  Lab 03/19/22 1745  NA 135  K 3.1*  CL 104  CO2  25  GLUCOSE 137*  BUN 10  CREATININE 0.69  CALCIUM 9.8   Liver Function Tests: Recent Labs  Lab 03/19/22 1745  AST 22  ALT 18  ALKPHOS 64  BILITOT 0.8  PROT 7.1  ALBUMIN 3.7   No results for input(s): "LIPASE", "AMYLASE" in the last 168 hours. No results for input(s): "AMMONIA" in the last 168 hours. CBC: Recent Labs  Lab 03/19/22 1745  WBC 6.5  NEUTROABS 3.9  HGB 12.1  HCT 36.8  MCV 90.4  PLT 266   Cardiac Enzymes: No results for input(s): "CKTOTAL", "CKMB", "CKMBINDEX", "TROPONINI" in the last 168 hours.  BNP (last 3 results) No results for input(s): "BNP" in the last 8760 hours.  ProBNP (last 3 results) No results for input(s): "PROBNP" in the last 8760 hours.  CBG: No results for input(s): "GLUCAP" in the last 168 hours.  Radiological Exams on Admission: CT PELVIS WO CONTRAST  Result Date: 03/19/2022 CLINICAL DATA:  Hernia suspected, inguinal or femoral. Possible reduction. EXAM: CT PELVIS WITHOUT CONTRAST TECHNIQUE: Multidetector CT imaging of the pelvis was performed following the standard protocol without  intravenous contrast. RADIATION DOSE REDUCTION: This exam was performed according to the departmental dose-optimization program which includes automated exposure control, adjustment of the mA and/or kV according to patient size and/or use of iterative reconstruction technique. COMPARISON:  CT abdomen and pelvis earlier today at 6:09 p.m. FINDINGS: Urinary Tract: Contrast from prior CT within the bladder. No acute abnormality. Bowel: Decreased distention of the cecum after reduction of the right inguinal hernia. No bowel loops are present in the right inguinal hernia. Mild wall thickening of the cecum has decreased in earlier today. The ileocecal valve and appendix (circa series 3/image 28) are no longer within the hernia. Normal appearance of the appendix. The visualized small bowel is normal in caliber. Vascular/Lymphatic: Arterial atherosclerotic calcification. No suspicious lymphadenopathy. Reproductive:  No mass or other significant abnormality Other: Decreased size of the right inguinal hernia which contains fat fluid. Small amount of free fluid in the pelvis. Musculoskeletal: ORIF left femur.  No acute fracture. IMPRESSION: 1. Decreased size of the right inguinal hernia which contains fat and fluid. No bowel loops are present in the right inguinal hernia. 2. Decreased distention and wall thickening of the cecum after reduction of the right inguinal hernia. The ileocecal valve and appendix are no longer within the hernia. These results were called by telephone at the time of interpretation on 03/19/2022 at 8:49 pm to provider [facility] , who verbally acknowledged these results. Electronically Signed   By: Placido Sou M.D.   On: 03/19/2022 20:53   CT Abdomen Pelvis W Contrast  Result Date: 03/19/2022 CLINICAL DATA:  Bowel obstruction suspected EXAM: CT ABDOMEN AND PELVIS WITH CONTRAST TECHNIQUE: Multidetector CT imaging of the abdomen and pelvis was performed using the standard protocol following  bolus administration of intravenous contrast. RADIATION DOSE REDUCTION: This exam was performed according to the departmental dose-optimization program which includes automated exposure control, adjustment of the mA and/or kV according to patient size and/or use of iterative reconstruction technique. CONTRAST:  173m OMNIPAQUE IOHEXOL 300 MG/ML  SOLN COMPARISON:  CT angio chest 01/05/2005 report without imaging, CT chest 10/28/2012 report without imaging FINDINGS: Lower chest: Centrally calcified 14 mm left lower lobe pulmonary nodule consistent with described chronic granuloma. Mild bronchial wall thickening. Mitral annular calcifications. Partially visualized cardiac leads. Possible tiny hiatal hernia. Hepatobiliary: No focal liver abnormality. Multiple gallstones noted within the gallbladder lumen. No gallbladder wall thickening  or pericholecystic fluid. No biliary dilatation. Pancreas: Multiple fluid density lesions within the pancreas with large its along the pancreatic head measuring 2.6 by 2.1 cm (2:24). Normal pancreatic contour. No surrounding inflammatory changes. No main pancreatic ductal dilatation. Spleen: Diffuse punctate calcifications consistent with sequelae of prior granulomatous disease. Normal in size without focal abnormality. Adrenals/Urinary Tract: No adrenal nodule bilaterally. Bilateral kidneys enhance symmetrically. Subcentimeter hypodensities too small to characterize-no further follow-up indicated. No hydronephrosis. No hydroureter. The urinary bladder is unremarkable. On delayed imaging, there is no urothelial wall thickening and there are no filling defects in the opacified portions of the bilateral collecting systems or ureters. Stomach/Bowel: Stomach is within normal limits. Dilated loop of cecum measuring up to 6cm with associated surrounding free fluid contained within the right inguinal hernia. The ileocecal valve as well as base of the appendix is noted within the hernia. Proximal  and distal transition points are noted at the entry and exit sites of the inguinal hernia. No evidence of small bowel wall thickening or dilatation. Few scattered colonic diverticula. Appendix appears normal. Vascular/Lymphatic: No abdominal aorta or iliac aneurysm. Severe atherosclerotic plaque of the aorta and its branches. No abdominal, pelvic, or inguinal lymphadenopathy. Reproductive: Uterus and bilateral adnexa are unremarkable. Other: Trace volume free fluid within the right inguinal hernia. No intraperitoneal free gas. No organized fluid collection. Musculoskeletal: Moderate volume right inguinal hernia containing a short loop of dilated terminal ileum with an abdominal defect of 2.3 cm. Free fluid is also noted within the hernia. Tiny fat containing umbilical hernia. No suspicious lytic or blastic osseous lesions. No acute displaced fracture. Multilevel degenerative changes of the spine as well as multilevel spondylolisthesis. Intramedullary nail fixation of the left femur. IMPRESSION: 1. Incarcerated right inguinal hernia leading to a cecal closed loop obstruction. The ileocecal valve and base of the appendix are also located within the hernia. Findings concerning for ischemia. Recommend emergent surgical consultation. 2. Colonic diverticulosis with no acute diverticulitis. 3. Cholelithiasis with no CT evidence of acute cholecystitis. 4. Multiple fluid density pancreatic lesions measuring up to 2.6 cm. 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 Correlation with more recent images would be of value to evaluate stability. Given patient's age, no further follow-up indicated at this time. When the patient is clinically stable and able to follow directions and hold their breath (preferably as an outpatient) further evaluation with dedicated outpatient MRI pancreatic protocol could be considered on a case by case basis. 5. Aortic Atherosclerosis (ICD10-I70.0) including mitral annular calcifications. 6. Sequelae of prior  granulomatous disease. These results were called by telephone at the time of interpretation on 03/19/2022 at 8:14 pm to provider [facility] , who verbally acknowledged these results. Electronically Signed   By: Iven Finn M.D.   On: 03/19/2022 20:17    EKG: I independently viewed the EKG done and my findings are as followed: Normal sinus rhythm at a rate of 70 bpm with LBBB and prolonged QTc at 505 ms  Assessment/Plan Present on Admission:  Incarcerated right inguinal hernia  Hypokalemia  PACEMAKER, PERMANENT  Essential hypertension  Atrial flutter, chronic (HCC)  Chronic diastolic CHF (congestive heart failure) ([place])  Principal Problem:   Incarcerated right inguinal hernia Active Problems:   Essential hypertension   PACEMAKER, PERMANENT   Chronic diastolic CHF (congestive heart failure) (HCC)   Atrial flutter, chronic (HCC)   Hypokalemia   Lactic acidosis   LBBB (left bundle branch block)   Type 2 diabetes mellitus with hyperglycemia (HCC)   Constipation   Obesity (  BMI 30-39.9)   Prolonged QT interval  Incarcerated right inguinal hernia This was reduced by general surgery Continue IV Dilaudid 0.5 mg every 4 hours as needed N.p.o. at midnight Cardiology will be consulted for preop evaluation due to cardiac history Continue IV Compazine as needed General surgery was already following patient that was always ordered recommendations  Hypokalemia K+ 3.1, this will be replenished  Prolonged QT interval QTc 564m Patient has a pacemaker Avoid QT prolonging drugs Magnesium level will be checked Repeat EKG in the morning  Lactic acidosis-resolved Lactic acid 2.2 > 1.5  Pancreatic lesions CT abdomen pelvis with contrast showed multiple fluid density pancreatic lesions measuring up to 2.6 cm. 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 No further follow-up indicated at this time per radiologist's recommendation  When the patient is clinically stable and able to follow directions  and hold their breath (preferably as an outpatient) further evaluation with dedicated outpatient MRI pancreatic protocol could be considered on a case by case basis per radiologist's recommendation  Atrial flutter Rate controlled, patient possible need pacemaker Continue telemetry Eliquis will be held at this time and that suspicion for possible surgical intervention pending cardiologist's preop evaluation and recommendation  Type 2 diabetes mellitus Hemoglobin A1c on 02/28/2022 was 5.9 Metformin will be held at this time Continue ISS and hypoglycemia protocol  Chronic diastolic congestive heart failure Stable Continue total input/output, daily weights and fluid restriction Continue heart healthy/modified carb diet   LBBB S/p pacemaker implantation Last reviewed remotely on 03/06/2022 with report for battery approaching elective replacement indicator Continue telemetry  Essential hypertension Continue irbesartan  Constipation Continue senna - docusate  Obesity (BMI 30.63) Diet and lifestyle medication   DVT prophylaxis: SCDs   Advance Care Planning: CODE STATUS: DNR  Consults: General surgery, cardiology  Family Communication: Daughter at bedside (all questions answered to satisfaction)  Severity of Illness: The appropriate patient status for this patient is INPATIENT. Inpatient status is judged to be reasonable and necessary in order to provide the required intensity of service to ensure the patient's safety. The patient's presenting symptoms, physical exam findings, and initial radiographic and laboratory data in the context of their chronic comorbidities is felt to place them at high risk for further clinical deterioration. Furthermore, it is not anticipated that the patient will be medically stable for discharge from the hospital within 2 midnights of admission.   * I certify that at the point of admission it is my clinical judgment that the patient will require  inpatient hospital care spanning beyond 2 midnights from the point of admission due to high intensity of service, high risk for further deterioration and high frequency of surveillance required.*  Author: OBernadette Hoit DO 03/19/2022 11:01 PM  For on call review [email or web address]

## 2022-03-19 NOTE — ED Provider Notes (Signed)
[facility] Provider Note  CSN: [id number] Arrival date & time: 03/19/22 1705  Chief Complaint(s) Hernia  HPI Brittany [name] is a 89 y.o. female history of coronary artery disease, CHF, heart block status post pacemaker, hypertension, hyperlipidemia, diabetes presenting to the emergency department with hernia pain.  Per EMS, patient has had hernia for around a year and typically will self reduce.  Today daughter noticed that she was in increased pain and the hernia would not go back in.  No vomiting or diarrhea.  Symptoms began today.   Past Medical History Past Medical History:  Diagnosis Date   Atrial flutter ([place])    CAD (coronary artery disease)    CHF (congestive heart failure) ([place])    Complete heart block ([place])    a. s/p STJ dual chamber PPM   Depression    Glaucoma    Hyperlipidemia    Hypertension    Type II diabetes mellitus ([place])    Patient Active Problem List   Diagnosis Date Noted   Incarcerated right inguinal hernia [id number]   Acute metabolic encephalopathy 0000000   Hypokalemia 02/28/2022   Leg swelling 06/22/2020   Atrial flutter (HCC)-tachycardia 05/06/2020   CHF (congestive heart failure) ([place]) 01/02/2015   DM2 (diabetes mellitus, type 2) ([place]) 01/02/2015   Belching 10/22/2012   SOB (shortness of breath) 10/22/2012   Lower extremity numbness 06/20/2012   Closed left hip fracture ([place]) 01/18/2012   HYPERSOMNIA UNSPECIFIED 06/23/2008   EDEMA 06/23/2008   GLAUCOMA 05/27/2008   LAD-balloon angioplasty/cutting balloon-2007 05/27/2008   AV BLOCK, COMPLETE 05/27/2008   PACEMAKER, PERMANENT 05/27/2008   HYPERLIPIDEMIA 11/05/2006   Essential hypertension 11/05/2006   Home Medication(s) Prior to Admission medications   Medication Sig Start Date End Date Taking? Authorizing Provider  acetaminophen (TYLENOL) 325 MG tablet Take 650 mg by mouth as needed.    [provider]  cyanocobalamin 1000 MCG tablet Take by mouth daily.     [provider]  ELIQUIS 5 MG TABS tablet TAKE 1 TABLET TWICE A DAY Patient taking differently: Take 5 mg by mouth 2 (two) times daily. 09/05/17   Deboraha Sprang, MD  furosemide (LASIX) 40 MG tablet Take 1 tablet (40 mg total) by mouth daily. 03/02/22   Roxan Hockey, MD  glucose blood test strip USE FOR TESTING ONCE DAILY 08/07/16   [provider]  irbesartan (AVAPRO) 75 MG tablet Take 0.5 tablets (37.5 mg total) by mouth daily. 04/12/17   Chanetta [place] K, NP  isosorbide mononitrate (IMDUR) 30 MG 24 hr tablet Take 1 tablet by mouth daily. 09/04/18   [provider]  metFORMIN (GLUCOPHAGE-XR) 500 MG 24 hr tablet Take 500 mg by mouth daily with breakfast. 12/10/14   [provider]  Multiple Vitamin (MULTIVITAMIN) tablet Take 1 tablet by mouth daily. CENTRUM SILVER    [provider]  Jonetta Speak LANCETS [id number] [place]  11/06/16   [provider]  potassium chloride 20 MEQ TBCR Take 20 mEq by mouth daily. 01/05/15   Thurnell Lose, MD  senna-docusate (SENOKOT-S) 8.6-50 MG tablet Take 2 tablets by mouth 2 (two) times daily. 03/02/22 03/02/23  Roxan Hockey, MD  Past Surgical History Past Surgical History:  Procedure Laterality Date   BREAST BIOPSY  1991   ELECTROPHYSIOLOGIC STUDY N/A 02/12/2015   cardioversion by Dr Caryl Comes    EP IMPLANTABLE DEVICE N/A 04/07/2015   a. STJ dual chamber PPM implanted 2006 for complete heart block b. gen change 2017   FEMUR IM NAIL  01/18/2012   Procedure: INTRAMEDULLARY (IM) NAIL FEMORAL;  Surgeon: Marin Shutter, MD;  Location: [facility];  Service: Orthopedics;  Laterality: Left;   left total knee replacement Bilateral 1996   TONSILLECTOMY  1937   Family History Family History  Problem Relation Age of Onset   Diabetes Mother    Heart attack Father    Stroke Father    Hypertension  Father    Kidney disease Daughter     Social History Social History   Tobacco Use   Smoking status: Former    Types: Cigarettes    Quit date: 01/17/1951    Years since quitting: 71.2   Smokeless tobacco: Never  Vaping Use   Vaping Use: Never used  Substance Use Topics   Alcohol use: Yes    Comment: Occasional glass of wine   Drug use: No   Allergies Ciprofloxacin, Ace inhibitors, Cedax [ceftibuten], Lipitor [atorvastatin], Plavix [clopidogrel bisulfate], and Statins  Review of Systems Review of Systems  All other systems reviewed and are negative.   Physical Exam Vital Signs  I have reviewed the triage vital signs BP (!) 125/52 (BP Location: Left Arm)   Pulse 70   Temp 98 F (36.7 C) (Oral)   Resp 10   Ht '4\' 11"'$  (1.499 m)   Wt 68.8 kg   SpO2 97%   BMI 30.63 kg/m  Physical Exam Vitals and nursing note reviewed.  Constitutional:      General: She is not in acute distress.    Appearance: She is well-developed.  HENT:     Head: Normocephalic and atraumatic.     Mouth/Throat:     Mouth: Mucous membranes are moist.  Eyes:     Pupils: Pupils are equal, round, and reactive to light.  Cardiovascular:     Rate and Rhythm: Normal rate and regular rhythm.     Heart sounds: No murmur heard. Pulmonary:     Effort: Pulmonary effort is normal. No respiratory distress.     Breath sounds: Normal breath sounds.  Abdominal:     General: Abdomen is flat.     Palpations: Abdomen is soft.     Tenderness: There is no abdominal tenderness.  Genitourinary:    Comments: Chaperoned by nursing, large right inguinal hernia, not easily reducible with no overlying skin change Musculoskeletal:        General: No tenderness.     Right lower leg: No edema.     Left lower leg: No edema.  Skin:    General: Skin is warm and dry.  Neurological:     General: No focal deficit present.     Mental Status: She is alert. Mental status is at baseline.     Comments: Oriented to self   Psychiatric:        Mood and Affect: Mood normal.        Behavior: Behavior normal.     ED Results and Treatments Labs (all labs ordered are listed, but only abnormal results are displayed) Labs Reviewed  COMPREHENSIVE METABOLIC PANEL - Abnormal; Notable for the following components:      Result Value   Potassium 3.1 (*)  Glucose, Bld 137 (*)    All other components within normal limits  LACTIC ACID, PLASMA - Abnormal; Notable for the following components:   Lactic Acid, Venous 2.2 (*)    All other components within normal limits  CBC WITH DIFFERENTIAL/PLATELET  LACTIC ACID, PLASMA                                                                                                                          Radiology CT PELVIS WO CONTRAST  Result Date: 03/19/2022 CLINICAL DATA:  Hernia suspected, inguinal or femoral. Possible reduction. EXAM: CT PELVIS WITHOUT CONTRAST TECHNIQUE: Multidetector CT imaging of the pelvis was performed following the standard protocol without intravenous contrast. RADIATION DOSE REDUCTION: This exam was performed according to the departmental dose-optimization program which includes automated exposure control, adjustment of the mA and/or kV according to patient size and/or use of iterative reconstruction technique. COMPARISON:  CT abdomen and pelvis earlier today at 6:09 p.m. FINDINGS: Urinary Tract: Contrast from prior CT within the bladder. No acute abnormality. Bowel: Decreased distention of the cecum after reduction of the right inguinal hernia. No bowel loops are present in the right inguinal hernia. Mild wall thickening of the cecum has decreased in earlier today. The ileocecal valve and appendix (circa series 3/image 28) are no longer within the hernia. Normal appearance of the appendix. The visualized small bowel is normal in caliber. Vascular/Lymphatic: Arterial atherosclerotic calcification. No suspicious lymphadenopathy. Reproductive:  No mass or other  significant abnormality Other: Decreased size of the right inguinal hernia which contains fat fluid. Small amount of free fluid in the pelvis. Musculoskeletal: ORIF left femur.  No acute fracture. IMPRESSION: 1. Decreased size of the right inguinal hernia which contains fat and fluid. No bowel loops are present in the right inguinal hernia. 2. Decreased distention and wall thickening of the cecum after reduction of the right inguinal hernia. The ileocecal valve and appendix are no longer within the hernia. These results were called by telephone at the time of interpretation on 03/19/2022 at 8:49 pm to provider [facility] , who verbally acknowledged these results. Electronically Signed   By: Placido Sou M.D.   On: 03/19/2022 20:53   CT Abdomen Pelvis W Contrast  Result Date: 03/19/2022 CLINICAL DATA:  Bowel obstruction suspected EXAM: CT ABDOMEN AND PELVIS WITH CONTRAST TECHNIQUE: Multidetector CT imaging of the abdomen and pelvis was performed using the standard protocol following bolus administration of intravenous contrast. RADIATION DOSE REDUCTION: This exam was performed according to the departmental dose-optimization program which includes automated exposure control, adjustment of the mA and/or kV according to patient size and/or use of iterative reconstruction technique. CONTRAST:  138m OMNIPAQUE IOHEXOL 300 MG/ML  SOLN COMPARISON:  CT angio chest 01/05/2005 report without imaging, CT chest 10/28/2012 report without imaging FINDINGS: Lower chest: Centrally calcified 14 mm left lower lobe pulmonary nodule consistent with described chronic granuloma. Mild bronchial wall thickening. Mitral annular calcifications. Partially visualized cardiac leads. Possible tiny hiatal hernia. Hepatobiliary:  No focal liver abnormality. Multiple gallstones noted within the gallbladder lumen. No gallbladder wall thickening or pericholecystic fluid. No biliary dilatation. Pancreas: Multiple fluid density lesions within  the pancreas with large its along the pancreatic head measuring 2.6 by 2.1 cm (2:24). Normal pancreatic contour. No surrounding inflammatory changes. No main pancreatic ductal dilatation. Spleen: Diffuse punctate calcifications consistent with sequelae of prior granulomatous disease. Normal in size without focal abnormality. Adrenals/Urinary Tract: No adrenal nodule bilaterally. Bilateral kidneys enhance symmetrically. Subcentimeter hypodensities too small to characterize-no further follow-up indicated. No hydronephrosis. No hydroureter. The urinary bladder is unremarkable. On delayed imaging, there is no urothelial wall thickening and there are no filling defects in the opacified portions of the bilateral collecting systems or ureters. Stomach/Bowel: Stomach is within normal limits. Dilated loop of cecum measuring up to 6cm with associated surrounding free fluid contained within the right inguinal hernia. The ileocecal valve as well as base of the appendix is noted within the hernia. Proximal and distal transition points are noted at the entry and exit sites of the inguinal hernia. No evidence of small bowel wall thickening or dilatation. Few scattered colonic diverticula. Appendix appears normal. Vascular/Lymphatic: No abdominal aorta or iliac aneurysm. Severe atherosclerotic plaque of the aorta and its branches. No abdominal, pelvic, or inguinal lymphadenopathy. Reproductive: Uterus and bilateral adnexa are unremarkable. Other: Trace volume free fluid within the right inguinal hernia. No intraperitoneal free gas. No organized fluid collection. Musculoskeletal: Moderate volume right inguinal hernia containing a short loop of dilated terminal ileum with an abdominal defect of 2.3 cm. Free fluid is also noted within the hernia. Tiny fat containing umbilical hernia. No suspicious lytic or blastic osseous lesions. No acute displaced fracture. Multilevel degenerative changes of the spine as well as multilevel  spondylolisthesis. Intramedullary nail fixation of the left femur. IMPRESSION: 1. Incarcerated right inguinal hernia leading to a cecal closed loop obstruction. The ileocecal valve and base of the appendix are also located within the hernia. Findings concerning for ischemia. Recommend emergent surgical consultation. 2. Colonic diverticulosis with no acute diverticulitis. 3. Cholelithiasis with no CT evidence of acute cholecystitis. 4. Multiple fluid density pancreatic lesions measuring up to 2.6 cm. 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 Correlation with more recent images would be of value to evaluate stability. Given patient's age, no further follow-up indicated at this time. When the patient is clinically stable and able to follow directions and hold their breath (preferably as an outpatient) further evaluation with dedicated outpatient MRI pancreatic protocol could be considered on a case by case basis. 5. Aortic Atherosclerosis (ICD10-I70.0) including mitral annular calcifications. 6. Sequelae of prior granulomatous disease. These results were called by telephone at the time of interpretation on 03/19/2022 at 8:14 pm to provider [facility] , who verbally acknowledged these results. Electronically Signed   By: Iven Finn M.D.   On: 03/19/2022 20:17    Pertinent labs & imaging results that were available during my care of the patient were reviewed by me and considered in my medical decision making (see MDM for details).  Medications Ordered in ED Medications  HYDROmorphone (DILAUDID) injection 0.5 mg (has no administration in time range)  prochlorperazine (COMPAZINE) injection 10 mg (has no administration in time range)  potassium chloride SA (KLOR-CON M) CR tablet 40 mEq (has no administration in time range)  0.9 %  sodium chloride infusion (has no administration in time range)  fentaNYL (SUBLIMAZE) injection 50 mcg (50 mcg Intravenous Given 03/19/22 1731)  HYDROmorphone (DILAUDID) injection 0.5 mg  (0.5 mg  Intravenous Given 03/19/22 1749)  iohexol (OMNIPAQUE) 300 MG/ML solution 100 mL (100 mLs Intravenous Contrast Given 03/19/22 1904)  sodium chloride 0.9 % bolus 1,000 mL (0 mLs Intravenous Stopped 03/19/22 1940)  HYDROmorphone (DILAUDID) injection 1 mg (1 mg Intravenous Not Given 03/19/22 1927)  HYDROmorphone (DILAUDID) injection 1 mg (1 mg Intravenous Given 03/19/22 2000)                                                                                                                                     Procedures Hernia reduction  Date/Time: 03/19/2022 6:45 PM  Performed by: Cristie Hem, MD Authorized by: Cristie Hem, MD  Consent: Verbal consent obtained. Risks and benefits: risks, benefits and alternatives were discussed Consent given by: patient Required items: required blood products, implants, devices, and special equipment available Patient identity confirmed: verbally with patient and hospital-assigned identification number Time out: Immediately prior to procedure a "time out" was called to verify the correct patient, procedure, equipment, support staff and site/side marked as required. Preparation: Patient was prepped and draped in the usual sterile fashion. Local anesthesia used: no  Anesthesia: Local anesthesia used: no  Sedation: Patient sedated: no  Comments: Procedure unsuccessful      (including critical care time)  Medical Decision Making / ED Course   MDM:  89 year old female presenting to the emergency department with hernia.  On exam patient has a right inguinal hernia.  It is not easily reducible.  No reported vomiting to suggest acute bowel obstruction.  No skin changes to suggest acute strangulation.  Will attempt to give additional pain medication, place ice pack over hernia, placed in Trendelenburg and attempt reduction again.  If unable to be reduced likely discuss with surgery and obtain imaging.  Clinical Course as of 03/19/22  2202  Nichola Sizer  [id number] Discussed with Dr. Constance Haw. 2nd attempt without improvement. Agrees with CT scan. She will come and see patient.  [WS]  2031 Dr Constance Haw attempted reduction and requests repeat CT scan. Requests admission to medicine. Consideration of surgery pending repeat imaging. [WS]  2101 Repeat CT scan improved s/p reduction by Dr. Constance Haw. Discussed with hospitalist who will admit  [WS]    Clinical Course User Index [WS] Cristie Hem, MD     Additional history obtained: -Additional history obtained from family and ems -External records from outside source obtained and reviewed including: Chart review including previous notes, labs, imaging, consultation notes including admission for delirium 02/28/22   Lab Tests: -I ordered, reviewed, and interpreted labs.   The pertinent results include:   Labs Reviewed  COMPREHENSIVE METABOLIC PANEL - Abnormal; Notable for the following components:      Result Value   Potassium 3.1 (*)    Glucose, Bld 137 (*)    All other components within normal limits  LACTIC ACID, PLASMA - Abnormal; Notable for the following components:   Lactic Acid, Venous 2.2 (*)  All other components within normal limits  CBC WITH DIFFERENTIAL/PLATELET  LACTIC ACID, PLASMA    Notable for elevated lactic acid  Imaging Studies ordered: I ordered imaging studies including CT A/P, CT pelvis On my interpretation imaging demonstrates incarcerated hernia, improved s/p reduction by Dr. Constance Haw  I independently visualized and interpreted imaging. I agree with the radiologist interpretation   Medicines ordered and prescription drug management: Meds ordered this encounter  Medications   fentaNYL (SUBLIMAZE) injection 50 mcg   HYDROmorphone (DILAUDID) injection 0.5 mg   iohexol (OMNIPAQUE) 300 MG/ML solution 100 mL   sodium chloride 0.9 % bolus 1,000 mL   HYDROmorphone (DILAUDID) injection 1 mg   HYDROmorphone (DILAUDID) 1 MG/ML injection     Ragland, Melissa B: cabinet override   HYDROmorphone (DILAUDID) injection 1 mg   HYDROmorphone (DILAUDID) 1 MG/ML injection    Meta Hatchet J: cabinet override   HYDROmorphone (DILAUDID) injection 0.5 mg   prochlorperazine (COMPAZINE) injection 10 mg   potassium chloride SA (KLOR-CON M) CR tablet 40 mEq   0.9 %  sodium chloride infusion    -I have reviewed the patients home medicines and have made adjustments as needed   Consultations Obtained: I requested consultation with the general surgeon,  and discussed lab and imaging findings as well as pertinent plan - they recommend: admit to medicine for obs, consideration of operative repair  Social Determinants of Health:  Diagnosis or treatment significantly limited by social determinants of health: former smoker   Reevaluation: After the interventions noted above, I reevaluated the patient and found that their symptoms have improved  Co morbidities that complicate the patient evaluation  Past Medical History:  Diagnosis Date   Atrial flutter ([place])    CAD (coronary artery disease)    CHF (congestive heart failure) ([place])    Complete heart block ([place])    a. s/p STJ dual chamber PPM   Depression    Glaucoma    Hyperlipidemia    Hypertension    Type II diabetes mellitus ([place])       Dispostion: Disposition decision including need for hospitalization was considered, and patient admitted to the hospital.    Final Clinical Impression(s) / ED Diagnoses Final diagnoses:  Inguinal hernia of right side with obstruction     This chart was dictated using voice recognition software.  Despite best efforts to proofread,  errors can occur which can change the documentation meaning.    Cristie Hem, MD 03/19/22 2202

## 2022-03-19 NOTE — H&P (View-Only) (Signed)
[facility] Consult  Reason for Consult: Right inguinal hernia incarcerated  Referring Physician: Dr. Truett Mainland  Chief Complaint   Hernia     HPI: Brittany [name] is a 89 y.o. female with on the right groin for about 1 year. It always has gone back in with laying down in the past. She wears a belly binder at this usually helps it the area in. She has had a BM this AM and no nausea/vomiting. No prior issues with obstruction.  She is here today with her daughter Lemmie Evens. She took her Eliquis this AM.   Past Medical History:  Diagnosis Date   Atrial flutter ([place])    CAD (coronary artery disease)    CHF (congestive heart failure) (H[facility])    Complete heart block ([place])    a. s/p STJ dual chamber PPM   Depression    Glaucoma    Hyperlipidemia    Hypertension    Type II diabetes mellitus ([place])     Past Surgical History:  Procedure Laterality Date   BREAST BIOPSY  1991   ELECTROPHYSIOLOGIC STUDY N/A 02/12/2015   cardioversion by Dr Caryl Comes    EP IMPLANTABLE DEVICE N/A 04/07/2015   a. STJ dual chamber PPM implanted 2006 for complete heart block b. gen change 2017   FEMUR IM NAIL  01/18/2012   Procedure: INTRAMEDULLARY (IM) NAIL FEMORAL;  Surgeon: Marin Shutter, MD;  Location: [facility];  Service: Orthopedics;  Laterality: Left;   left total knee replacement Bilateral 1996   TONSILLECTOMY  1937    Family History  Problem Relation Age of Onset   Diabetes Mother    Heart attack Father    Stroke Father    Hypertension Father    Kidney disease Daughter     Social History   Tobacco Use   Smoking status: Former    Types: Cigarettes    Quit date: 01/17/1951    Years since quitting: 71.2   Smokeless tobacco: Never  Vaping Use   Vaping Use: Never used  Substance Use Topics   Alcohol use: Yes    Comment: Occasional glass of wine   Drug use: No    Medications: I have reviewed the patient's current medications. Current Outpatient Medications  Medication Instructions    acetaminophen (TYLENOL) 650 mg, Oral, As needed   cyanocobalamin 1000 MCG tablet Oral, Daily   ELIQUIS 5 MG TABS tablet TAKE 1 TABLET TWICE A DAY   furosemide (LASIX) 40 mg, Oral, Daily   glucose blood test strip USE FOR TESTING ONCE DAILY   irbesartan (AVAPRO) 37.5 mg, Oral, Daily   isosorbide mononitrate (IMDUR) 30 MG 24 hr tablet 1 tablet, Oral, Daily   metFORMIN (GLUCOPHAGE-XR) 500 mg, Oral, Daily with breakfast   Multiple Vitamin (MULTIVITAMIN) tablet 1 tablet, Oral, Daily, CENTRUM SILVER    ONETOUCH DELICA LANCETS [id number] MISC No dose, route, or frequency recorded.   potassium chloride 20 MEQ TBCR 20 mEq, Oral, Daily   senna-docusate (SENOKOT-S) 8.6-50 MG tablet 2 tablets, Oral, 2 times daily     Allergies  Allergen Reactions   Ciprofloxacin Other (See Comments)    Severe chest pains   Ace Inhibitors Other (See Comments)    REACTION: cough and sore throat   Cedax [Ceftibuten] Other (See Comments)    Possibly nausea/vomiting, cyanosis, severe headaches   Lipitor [Atorvastatin] Other (See Comments)    REACTION: Myalgia   Plavix [Clopidogrel Bisulfate] Other (See Comments)    Possibly nausea/vomiting, cyanosis, severe headaches  Statins Other (See Comments)    Myalgias, cyanosis     ROS:  A comprehensive review of systems was negative except for: Gastrointestinal: positive for abdominal pain and groin pain  Blood pressure (!) 141/57, pulse 74, temperature 97.9 F (36.6 C), temperature source Oral, resp. rate 15, height '4\' 11"'$  (1.499 m), weight 68.8 kg, SpO2 94 %. Physical Exam Vitals reviewed.  HENT:     Head: Normocephalic.     Nose: Nose normal.  Eyes:     Extraocular Movements: Extraocular movements intact.  Cardiovascular:     Rate and Rhythm: Normal rate.  Pulmonary:     Effort: Pulmonary effort is normal.  Abdominal:     General: There is no distension.     Palpations: Abdomen is soft.     Tenderness: There is no abdominal tenderness.     Hernia: A  hernia is present.     Comments: Tender around the right inguinal hernia with palpation, pressure, reduced partially and then reduced fully after more manipulation. Received dilaudid '1mg'$  X 2  Musculoskeletal:        General: No swelling.  Skin:    General: Skin is warm.  Neurological:     Mental Status: She is alert. Mental status is at baseline.  Psychiatric:        Mood and Affect: Mood normal.     Results: Results for orders placed or performed during the hospital encounter of 03/19/22 (from the past 48 hour(s))  Comprehensive metabolic panel     Status: Abnormal   Collection Time: 03/19/22  5:45 PM  Result Value Ref Range   Sodium 135 135 - 145 mmol/L   Potassium 3.1 (L) 3.5 - 5.1 mmol/L   Chloride 104 98 - 111 mmol/L   CO2 25 22 - 32 mmol/L   Glucose, Bld 137 (H) 70 - 99 mg/dL    Comment: Glucose reference range applies only to samples taken after fasting for at least 8 hours.   BUN 10 8 - 23 mg/dL   Creatinine, Ser 0.69 0.44 - 1.00 mg/dL   Calcium 9.8 8.9 - 10.3 mg/dL   Total Protein 7.1 6.5 - 8.1 g/dL   Albumin 3.7 3.5 - 5.0 g/dL   AST 22 15 - 41 U/L   ALT 18 0 - 44 U/L   Alkaline Phosphatase 64 38 - 126 U/L   Total Bilirubin 0.8 0.3 - 1.2 mg/dL   GFR, Estimated >60 >60 mL/min    Comment: (NOTE) Calculated using the CKD-EPI Creatinine Equation (2021)    Anion gap 6 5 - 15    Comment: Performed at [facility], [address], [place], [place] [address]  CBC with Differential     Status: None   Collection Time: 03/19/22  5:45 PM  Result Value Ref Range   WBC 6.5 4.0 - 10.5 K/uL   RBC 4.07 3.87 - 5.11 MIL/uL   Hemoglobin 12.1 12.0 - 15.0 g/dL   HCT 36.8 36.0 - 46.0 %   MCV 90.4 80.0 - 100.0 fL   MCH 29.7 26.0 - 34.0 pg   MCHC 32.9 30.0 - 36.0 g/dL   RDW 14.2 11.5 - 15.5 %   Platelets 266 150 - 400 K/uL   nRBC 0.0 0.0 - 0.2 %   Neutrophils Relative % 58 %   Neutro Abs 3.9 1.7 - 7.7 K/uL   Lymphocytes Relative 29 %   Lymphs Abs 1.9 0.7 - 4.0 K/uL   Monocytes  Relative 9 %  Monocytes Absolute 0.6 0.1 - 1.0 K/uL   Eosinophils Relative 2 %   Eosinophils Absolute 0.1 0.0 - 0.5 K/uL   Basophils Relative 1 %   Basophils Absolute 0.0 0.0 - 0.1 K/uL   Immature Granulocytes 1 %   Abs Immature Granulocytes 0.03 0.00 - 0.07 K/uL    Comment: Performed at [facility], [address], [place], [place] [address]  Lactic acid, plasma     Status: Abnormal   Collection Time: 03/19/22  5:45 PM  Result Value Ref Range   Lactic Acid, Venous 2.2 (HH) 0.5 - 1.9 mmol/L    Comment: CRITICAL RESULT CALLED TO, READ BACK BY AND VERIFIED WITH DORAN, P AT 1821 ON 03/19/22 BY SMN. Performed at [facility], [address], [place], [place] [address]    Personally reviewed- incarcerated cecum in the hernia, reviewed prior to report being in  and reduced prior to report being in in the system. Repeat CT done  CT Abdomen Pelvis W Contrast  Result Date: 03/19/2022 CLINICAL DATA:  Bowel obstruction suspected EXAM: CT ABDOMEN AND PELVIS WITH CONTRAST TECHNIQUE: Multidetector CT imaging of the abdomen and pelvis was performed using the standard protocol following bolus administration of intravenous contrast. RADIATION DOSE REDUCTION: This exam was performed according to the departmental dose-optimization program which includes automated exposure control, adjustment of the mA and/or kV according to patient size and/or use of iterative reconstruction technique. CONTRAST:  127m OMNIPAQUE IOHEXOL 300 MG/ML  SOLN COMPARISON:  CT angio chest 01/05/2005 report without imaging, CT chest 10/28/2012 report without imaging FINDINGS: Lower chest: Centrally calcified 14 mm left lower lobe pulmonary nodule consistent with described chronic granuloma. Mild bronchial wall thickening. Mitral annular calcifications. Partially visualized cardiac leads. Possible tiny hiatal hernia. Hepatobiliary: No focal liver abnormality. Multiple gallstones noted within the gallbladder lumen. No gallbladder wall  thickening or pericholecystic fluid. No biliary dilatation. Pancreas: Multiple fluid density lesions within the pancreas with large its along the pancreatic head measuring 2.6 by 2.1 cm (2:24). Normal pancreatic contour. No surrounding inflammatory changes. No main pancreatic ductal dilatation. Spleen: Diffuse punctate calcifications consistent with sequelae of prior granulomatous disease. Normal in size without focal abnormality. Adrenals/Urinary Tract: No adrenal nodule bilaterally. Bilateral kidneys enhance symmetrically. Subcentimeter hypodensities too small to characterize-no further follow-up indicated. No hydronephrosis. No hydroureter. The urinary bladder is unremarkable. On delayed imaging, there is no urothelial wall thickening and there are no filling defects in the opacified portions of the bilateral collecting systems or ureters. Stomach/Bowel: Stomach is within normal limits. Dilated loop of cecum measuring up to 6cm with associated surrounding free fluid contained within the right inguinal hernia. The ileocecal valve as well as base of the appendix is noted within the hernia. Proximal and distal transition points are noted at the entry and exit sites of the inguinal hernia. No evidence of small bowel wall thickening or dilatation. Few scattered colonic diverticula. Appendix appears normal. Vascular/Lymphatic: No abdominal aorta or iliac aneurysm. Severe atherosclerotic plaque of the aorta and its branches. No abdominal, pelvic, or inguinal lymphadenopathy. Reproductive: Uterus and bilateral adnexa are unremarkable. Other: Trace volume free fluid within the right inguinal hernia. No intraperitoneal free gas. No organized fluid collection. Musculoskeletal: Moderate volume right inguinal hernia containing a short loop of dilated terminal ileum with an abdominal defect of 2.3 cm. Free fluid is also noted within the hernia. Tiny fat containing umbilical hernia. No suspicious lytic or blastic osseous  lesions. No acute displaced fracture. Multilevel degenerative changes of the spine as well  as multilevel spondylolisthesis. Intramedullary nail fixation of the left femur. IMPRESSION: 1. Incarcerated right inguinal hernia leading to a cecal closed loop obstruction. The ileocecal valve and base of the appendix are also located within the hernia. Findings concerning for ischemia. Recommend emergent surgical consultation. 2. Colonic diverticulosis with no acute diverticulitis. 3. Cholelithiasis with no CT evidence of acute cholecystitis. 4. Multiple fluid density pancreatic lesions measuring up to 2.6 cm. 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 Correlation with more recent images would be of value to evaluate stability. Given patient's age, no further follow-up indicated at this time. When the patient is clinically stable and able to follow directions and hold their breath (preferably as an outpatient) further evaluation with dedicated outpatient MRI pancreatic protocol could be considered on a case by case basis. 5. Aortic Atherosclerosis (ICD10-I70.0) including mitral annular calcifications. 6. Sequelae of prior granulomatous disease. These results were called by telephone at the time of interpretation on 03/19/2022 at 8:14 pm to provider [facility] , who verbally acknowledged these results. Electronically Signed   By: Iven Finn M.D.   On: 03/19/2022 20:17     Assessment & Plan:  KARLETTE CANTWELL is a 89 y.o. female with an incarcerated right inguinal hernia with cecum. After dilaudid '1mg'$  X 1 and manual pressure I was able to get her hernia reduced. She tolerated this but with some discomfort.  CT ordered to confirm complete reduction.    -Discussed repair with daughter and reason for reduction given risk of strangulatoin/ ischemia with the bowel in the hernia.  Discussed risk of bleeding especially with eliquis use, risk of needing a bowel resection, risk of recurrence, risk of using a mesh or primary repair  if not able to use a mesh. Discussed need to monitor overnight given the reduction and issues with the bowel being out for a few hours.   Repeat CT ordered to see if fully reduced.  Abdomen soft and non-tender after reduction  Will need medical admission Hold eliquis  NPO for now  Labs in AM   All questions were answered to the satisfaction of the patient and family.  Greater than 50% of the 120 minute visit was spent in counseling/ coordination of care regarding her hernia and getting the hernia reduced and repeating a CT scan to confirm complete reduction.      Virl Cagey 03/19/2022, 8:20 PM

## 2022-03-19 NOTE — ED Notes (Signed)
Per Dr. Constance Haw, requested pt receive '1mg'$  dilaudid for hernia reduction at bedside

## 2022-03-19 NOTE — Consult Note (Addendum)
[facility] Consult  Reason for Consult: Right inguinal hernia incarcerated  Referring Physician: Dr. Truett Mainland  Chief Complaint   Hernia     HPI: Brittany [name] is a 89 y.o. female with on the right groin for about 1 year. It always has gone back in with laying down in the past. She wears a belly binder at this usually helps it the area in. She has had a BM this AM and no nausea/vomiting. No prior issues with obstruction.  She is here today with her daughter Brittany [name]. She took her Eliquis this AM.   Past Medical History:  Diagnosis Date   Atrial flutter ([place])    CAD (coronary artery disease)    CHF (congestive heart failure) (HCC)    Complete heart block ([place])    a. s/p STJ dual chamber PPM   Depression    Glaucoma    Hyperlipidemia    Hypertension    Type II diabetes mellitus ([place])     Past Surgical History:  Procedure Laterality Date   BREAST BIOPSY  1991   ELECTROPHYSIOLOGIC STUDY N/A 02/12/2015   cardioversion by Dr Caryl Comes    EP IMPLANTABLE DEVICE N/A 04/07/2015   a. STJ dual chamber PPM implanted 2006 for complete heart block b. gen change 2017   FEMUR IM NAIL  01/18/2012   Procedure: INTRAMEDULLARY (IM) NAIL FEMORAL;  Surgeon: Marin Shutter, MD;  Location: [facility];  Service: Orthopedics;  Laterality: Left;   left total knee replacement Bilateral 1996   TONSILLECTOMY  1937    Family History  Problem Relation Age of Onset   Diabetes Mother    Heart attack Father    Stroke Father    Hypertension Father    Kidney disease Daughter     Social History   Tobacco Use   Smoking status: Former    Types: Cigarettes    Quit date: 01/17/1951    Years since quitting: 71.2   Smokeless tobacco: Never  Vaping Use   Vaping Use: Never used  Substance Use Topics   Alcohol use: Yes    Comment: Occasional glass of wine   Drug use: No    Medications: I have reviewed the patient's current medications. Current Outpatient Medications  Medication Instructions    acetaminophen (TYLENOL) 650 mg, Oral, As needed   cyanocobalamin 1000 MCG tablet Oral, Daily   ELIQUIS 5 MG TABS tablet TAKE 1 TABLET TWICE A DAY   furosemide (LASIX) 40 mg, Oral, Daily   glucose blood test strip USE FOR TESTING ONCE DAILY   irbesartan (AVAPRO) 37.5 mg, Oral, Daily   isosorbide mononitrate (IMDUR) 30 MG 24 hr tablet 1 tablet, Oral, Daily   metFORMIN (GLUCOPHAGE-XR) 500 mg, Oral, Daily with breakfast   Multiple Vitamin (MULTIVITAMIN) tablet 1 tablet, Oral, Daily, CENTRUM SILVER    ONETOUCH DELICA LANCETS [id number] MISC No dose, route, or frequency recorded.   potassium chloride 20 MEQ TBCR 20 mEq, Oral, Daily   senna-docusate (SENOKOT-S) 8.6-50 MG tablet 2 tablets, Oral, 2 times daily     Allergies  Allergen Reactions   Ciprofloxacin Other (See Comments)    Severe chest pains   Ace Inhibitors Other (See Comments)    REACTION: cough and sore throat   Cedax [Ceftibuten] Other (See Comments)    Possibly nausea/vomiting, cyanosis, severe headaches   Lipitor [Atorvastatin] Other (See Comments)    REACTION: Myalgia   Plavix [Clopidogrel Bisulfate] Other (See Comments)    Possibly nausea/vomiting, cyanosis, severe headaches  Statins Other (See Comments)    Myalgias, cyanosis     ROS:  A comprehensive review of systems was negative except for: Gastrointestinal: positive for abdominal pain and groin pain  Blood pressure (!) 141/57, pulse 74, temperature 97.9 F (36.6 C), temperature source Oral, resp. rate 15, height '4\' 11"'$  (1.499 m), weight 68.8 kg, SpO2 94 %. Physical Exam Vitals reviewed.  HENT:     Head: Normocephalic.     Nose: Nose normal.  Eyes:     Extraocular Movements: Extraocular movements intact.  Cardiovascular:     Rate and Rhythm: Normal rate.  Pulmonary:     Effort: Pulmonary effort is normal.  Abdominal:     General: There is no distension.     Palpations: Abdomen is soft.     Tenderness: There is no abdominal tenderness.     Hernia: A  hernia is present.     Comments: Tender around the right inguinal hernia with palpation, pressure, reduced partially and then reduced fully after more manipulation. Received dilaudid '1mg'$  X 2  Musculoskeletal:        General: No swelling.  Skin:    General: Skin is warm.  Neurological:     Mental Status: She is alert. Mental status is at baseline.  Psychiatric:        Mood and Affect: Mood normal.     Results: Results for orders placed or performed during the hospital encounter of 03/19/22 (from the past 48 hour(s))  Comprehensive metabolic panel     Status: Abnormal   Collection Time: 03/19/22  5:45 PM  Result Value Ref Range   Sodium 135 135 - 145 mmol/L   Potassium 3.1 (L) 3.5 - 5.1 mmol/L   Chloride 104 98 - 111 mmol/L   CO2 25 22 - 32 mmol/L   Glucose, Bld 137 (H) 70 - 99 mg/dL    Comment: Glucose reference range applies only to samples taken after fasting for at least 8 hours.   BUN 10 8 - 23 mg/dL   Creatinine, Ser 0.69 0.44 - 1.00 mg/dL   Calcium 9.8 8.9 - 10.3 mg/dL   Total Protein 7.1 6.5 - 8.1 g/dL   Albumin 3.7 3.5 - 5.0 g/dL   AST 22 15 - 41 U/L   ALT 18 0 - 44 U/L   Alkaline Phosphatase 64 38 - 126 U/L   Total Bilirubin 0.8 0.3 - 1.2 mg/dL   GFR, Estimated >60 >60 mL/min    Comment: (NOTE) Calculated using the CKD-EPI Creatinine Equation (2021)    Anion gap 6 5 - 15    Comment: Performed at [facility], [address], [place], [place] [address]  CBC with Differential     Status: None   Collection Time: 03/19/22  5:45 PM  Result Value Ref Range   WBC 6.5 4.0 - 10.5 K/uL   RBC 4.07 3.87 - 5.11 MIL/uL   Hemoglobin 12.1 12.0 - 15.0 g/dL   HCT 36.8 36.0 - 46.0 %   MCV 90.4 80.0 - 100.0 fL   MCH 29.7 26.0 - 34.0 pg   MCHC 32.9 30.0 - 36.0 g/dL   RDW 14.2 11.5 - 15.5 %   Platelets 266 150 - 400 K/uL   nRBC 0.0 0.0 - 0.2 %   Neutrophils Relative % 58 %   Neutro Abs 3.9 1.7 - 7.7 K/uL   Lymphocytes Relative 29 %   Lymphs Abs 1.9 0.7 - 4.0 K/uL   Monocytes  Relative 9 %  Monocytes Absolute 0.6 0.1 - 1.0 K/uL   Eosinophils Relative 2 %   Eosinophils Absolute 0.1 0.0 - 0.5 K/uL   Basophils Relative 1 %   Basophils Absolute 0.0 0.0 - 0.1 K/uL   Immature Granulocytes 1 %   Abs Immature Granulocytes 0.03 0.00 - 0.07 K/uL    Comment: Performed at [facility], [address], [place], [place] [address]  Lactic acid, plasma     Status: Abnormal   Collection Time: 03/19/22  5:45 PM  Result Value Ref Range   Lactic Acid, Venous 2.2 (HH) 0.5 - 1.9 mmol/L    Comment: CRITICAL RESULT CALLED TO, READ BACK BY AND VERIFIED WITH DORAN, P AT 1821 ON 03/19/22 BY SMN. Performed at [facility], [address] [place], [place] [address]    Personally reviewed- incarcerated cecum in the hernia, reviewed prior to report being in  and reduced prior to report being in in the system. Repeat CT done  CT Abdomen Pelvis W Contrast  Result Date: 03/19/2022 CLINICAL DATA:  Bowel obstruction suspected EXAM: CT ABDOMEN AND PELVIS WITH CONTRAST TECHNIQUE: Multidetector CT imaging of the abdomen and pelvis was performed using the standard protocol following bolus administration of intravenous contrast. RADIATION DOSE REDUCTION: This exam was performed according to the departmental dose-optimization program which includes automated exposure control, adjustment of the mA and/or kV according to patient size and/or use of iterative reconstruction technique. CONTRAST:  159m OMNIPAQUE IOHEXOL 300 MG/ML  SOLN COMPARISON:  CT angio chest 01/05/2005 report without imaging, CT chest 10/28/2012 report without imaging FINDINGS: Lower chest: Centrally calcified 14 mm left lower lobe pulmonary nodule consistent with described chronic granuloma. Mild bronchial wall thickening. Mitral annular calcifications. Partially visualized cardiac leads. Possible tiny hiatal hernia. Hepatobiliary: No focal liver abnormality. Multiple gallstones noted within the gallbladder lumen. No gallbladder wall  thickening or pericholecystic fluid. No biliary dilatation. Pancreas: Multiple fluid density lesions within the pancreas with large its along the pancreatic head measuring 2.6 by 2.1 cm (2:24). Normal pancreatic contour. No surrounding inflammatory changes. No main pancreatic ductal dilatation. Spleen: Diffuse punctate calcifications consistent with sequelae of prior granulomatous disease. Normal in size without focal abnormality. Adrenals/Urinary Tract: No adrenal nodule bilaterally. Bilateral kidneys enhance symmetrically. Subcentimeter hypodensities too small to characterize-no further follow-up indicated. No hydronephrosis. No hydroureter. The urinary bladder is unremarkable. On delayed imaging, there is no urothelial wall thickening and there are no filling defects in the opacified portions of the bilateral collecting systems or ureters. Stomach/Bowel: Stomach is within normal limits. Dilated loop of cecum measuring up to 6cm with associated surrounding free fluid contained within the right inguinal hernia. The ileocecal valve as well as base of the appendix is noted within the hernia. Proximal and distal transition points are noted at the entry and exit sites of the inguinal hernia. No evidence of small bowel wall thickening or dilatation. Few scattered colonic diverticula. Appendix appears normal. Vascular/Lymphatic: No abdominal aorta or iliac aneurysm. Severe atherosclerotic plaque of the aorta and its branches. No abdominal, pelvic, or inguinal lymphadenopathy. Reproductive: Uterus and bilateral adnexa are unremarkable. Other: Trace volume free fluid within the right inguinal hernia. No intraperitoneal free gas. No organized fluid collection. Musculoskeletal: Moderate volume right inguinal hernia containing a short loop of dilated terminal ileum with an abdominal defect of 2.3 cm. Free fluid is also noted within the hernia. Tiny fat containing umbilical hernia. No suspicious lytic or blastic osseous  lesions. No acute displaced fracture. Multilevel degenerative changes of the spine as well  as multilevel spondylolisthesis. Intramedullary nail fixation of the left femur. IMPRESSION: 1. Incarcerated right inguinal hernia leading to a cecal closed loop obstruction. The ileocecal valve and base of the appendix are also located within the hernia. Findings concerning for ischemia. Recommend emergent surgical consultation. 2. Colonic diverticulosis with no acute diverticulitis. 3. Cholelithiasis with no CT evidence of acute cholecystitis. 4. Multiple fluid density pancreatic lesions measuring up to 2.6 cm. 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 Correlation with more recent images would be of value to evaluate stability. Given patient's age, no further follow-up indicated at this time. When the patient is clinically stable and able to follow directions and hold their breath (preferably as an outpatient) further evaluation with dedicated outpatient MRI pancreatic protocol could be considered on a case by case basis. 5. Aortic Atherosclerosis (ICD10-I70.0) including mitral annular calcifications. 6. Sequelae of prior granulomatous disease. These results were called by telephone at the time of interpretation on 03/19/2022 at 8:14 pm to provider [facility] , who verbally acknowledged these results. Electronically Signed   By: Iven Finn M.D.   On: 03/19/2022 20:17     Assessment & Plan:  Brittany [name] is a 89 y.o. female with an incarcerated right inguinal hernia with cecum. After dilaudid '1mg'$  X 1 and manual pressure I was able to get her hernia reduced. She tolerated this but with some discomfort.  CT ordered to confirm complete reduction.    -Discussed repair with daughter and reason for reduction given risk of strangulatoin/ ischemia with the bowel in the hernia.  Discussed risk of bleeding especially with eliquis use, risk of needing a bowel resection, risk of recurrence, risk of using a mesh or primary repair  if not able to use a mesh. Discussed need to monitor overnight given the reduction and issues with the bowel being out for a few hours.   Repeat CT ordered to see if fully reduced.  Abdomen soft and non-tender after reduction  Will need medical admission Hold eliquis  NPO for now  Labs in AM   All questions were answered to the satisfaction of the patient and family.  Greater than 50% of the 120 minute visit was spent in counseling/ coordination of care regarding her hernia and getting the hernia reduced and repeating a CT scan to confirm complete reduction.      Virl Cagey 03/19/2022, 8:20 PM

## 2022-03-19 NOTE — ED Notes (Signed)
Spoke with CT. They are coming to get pt for scan momentarily

## 2022-03-19 NOTE — ED Notes (Signed)
EDP at bedside  

## 2022-03-19 NOTE — ED Notes (Signed)
Pt sleeping and O2 sat dropped to 90%. Placed pt on 2L [place] for comfort while sleeping. O2 sat now at 96%

## 2022-03-19 NOTE — ED Triage Notes (Signed)
Pt arrived [facility] from home for c/o hernia pain . Caretaker (daughter) stated pt has had hernia for a year but usually stops hurting. Hernia is protruding more than usually and in painful .

## 2022-03-19 NOTE — Progress Notes (Signed)
[facility]  Repeat CT with full reduction, bowel looks healthy. NPO tonight Monitor Labs in AM Hold Eliquis and blood thinners pending potential surgery. Cardiology to weight in on risk of surgery given her history   Curlene Labrum, MD [facility] [address] [place], [place] [address] [phone] (office)

## 2022-03-19 NOTE — ED Notes (Signed)
Patient transported to CT 

## 2022-03-20 ENCOUNTER — Other Ambulatory Visit (HOSPITAL_COMMUNITY): Payer: Self-pay | Admitting: *Deleted

## 2022-03-20 ENCOUNTER — Encounter (HOSPITAL_COMMUNITY): Payer: Self-pay | Admitting: Internal Medicine

## 2022-03-20 ENCOUNTER — Ambulatory Visit (HOSPITAL_COMMUNITY): Payer: Medicare Other

## 2022-03-20 ENCOUNTER — Inpatient Hospital Stay (HOSPITAL_COMMUNITY): Payer: Medicare Other

## 2022-03-20 DIAGNOSIS — I351 Nonrheumatic aortic (valve) insufficiency: Secondary | ICD-10-CM | POA: Diagnosis not present

## 2022-03-20 DIAGNOSIS — Z950 Presence of cardiac pacemaker: Secondary | ICD-10-CM

## 2022-03-20 DIAGNOSIS — Z01810 Encounter for preprocedural cardiovascular examination: Secondary | ICD-10-CM

## 2022-03-20 DIAGNOSIS — I484 Atypical atrial flutter: Secondary | ICD-10-CM

## 2022-03-20 DIAGNOSIS — K4030 Unilateral inguinal hernia, with obstruction, without gangrene, not specified as recurrent: Secondary | ICD-10-CM | POA: Diagnosis not present

## 2022-03-20 DIAGNOSIS — I2510 Atherosclerotic heart disease of native coronary artery without angina pectoris: Secondary | ICD-10-CM

## 2022-03-20 LAB — COMPREHENSIVE METABOLIC PANEL
ALT: 14 U/L (ref 0–44)
AST: 15 U/L (ref 15–41)
Albumin: 3.2 g/dL — ABNORMAL LOW (ref 3.5–5.0)
Alkaline Phosphatase: 55 U/L (ref 38–126)
Anion gap: 5 (ref 5–15)
BUN: 10 mg/dL (ref 8–23)
CO2: 27 mmol/L (ref 22–32)
Calcium: 9.4 mg/dL (ref 8.9–10.3)
Chloride: 107 mmol/L (ref 98–111)
Creatinine, Ser: 0.62 mg/dL (ref 0.44–1.00)
GFR, Estimated: >60 mL/min (ref >60)
Glucose, Bld: 84 mg/dL (ref 70–99)
Potassium: 4 mmol/L (ref 3.5–5.1)
Sodium: 139 mmol/L (ref 135–145)
Total Bilirubin: 0.8 mg/dL (ref 0.3–1.2)
Total Protein: 5.9 g/dL — ABNORMAL LOW (ref 6.5–8.1)

## 2022-03-20 LAB — CBC
HCT: 33.4 % — ABNORMAL LOW (ref 36.0–46.0)
Hemoglobin: 10.8 g/dL — ABNORMAL LOW (ref 12.0–15.0)
MCH: 29.9 pg (ref 26.0–34.0)
MCHC: 32.3 g/dL (ref 30.0–36.0)
MCV: 92.5 fL (ref 80.0–100.0)
Platelets: 229 10*3/uL (ref 150–400)
RBC: 3.61 MIL/uL — ABNORMAL LOW (ref 3.87–5.11)
RDW: 14.3 % (ref 11.5–15.5)
WBC: 6.1 10*3/uL (ref 4.0–10.5)
nRBC: 0 % (ref 0.0–0.2)

## 2022-03-20 LAB — ECHOCARDIOGRAM COMPLETE
Area-P 1/2: 3.17 cm2
Height: 59 in
MV VTI: 1.79 cm2
P 1/2 time: 387 msec
S' Lateral: 3.2 cm
Weight: 2345.69 oz

## 2022-03-20 LAB — GLUCOSE, CAPILLARY
Glucose-Capillary: 117 mg/dL — ABNORMAL HIGH (ref 70–99)
Glucose-Capillary: 132 mg/dL — ABNORMAL HIGH (ref 70–99)
Glucose-Capillary: 82 mg/dL (ref 70–99)
Glucose-Capillary: 86 mg/dL (ref 70–99)
Glucose-Capillary: 87 mg/dL (ref 70–99)

## 2022-03-20 LAB — PHOSPHORUS: Phosphorus: 2.8 mg/dL (ref 2.5–4.6)

## 2022-03-20 LAB — MAGNESIUM: Magnesium: 2 mg/dL (ref 1.7–2.4)

## 2022-03-20 MED ORDER — POLYETHYLENE GLYCOL 3350 17 G PO PACK
17.0000 g | PACK | Freq: Two times a day (BID) | ORAL | Status: DC
  Administered 2022-03-20 – 2022-03-23 (×5): 17 g via ORAL
  Filled 2022-03-20 (×6): qty 1

## 2022-03-20 NOTE — Progress Notes (Signed)
Remote pacemaker transmission.   

## 2022-03-20 NOTE — Progress Notes (Signed)
NO acute events overnight. Pt slept most of night. Brittany [name]

## 2022-03-20 NOTE — Progress Notes (Signed)
*  PRELIMINARY RESULTS* Echocardiogram 2D Echocardiogram has been performed.  Brittany [name] 03/20/2022, 3:10 PM

## 2022-03-20 NOTE — Progress Notes (Signed)
  Transition of Care [facility]) Screening Note   Patient Details  Name: Brittany [name] Date of Birth: Nov 30, 1927   Transition of Care [facility]) CM/SW Contact:    Ihor Gully, LCSW Phone Number: 03/20/2022, 10:29 AM    Transition of Care Department [facility]) has reviewed patient and no TOC needs have been identified at this time. We will continue to monitor patient advancement through interdisciplinary progression rounds. If new patient transition needs arise, please place a TOC consult.

## 2022-03-20 NOTE — Addendum Note (Signed)
Addended by: Douglass Rivers D on: 03/20/2022 01:01 PM   Modules accepted: Level of Service

## 2022-03-20 NOTE — Consult Note (Addendum)
Cardiology Consultation   Patient ID: Brittany [name] MRN: [id number]; DOB: 08-18-1927  Admit date: 03/19/2022 Date of Consult: 03/20/2022  PCP:  Bernerd Limbo, MD   [place] Providers Cardiologist:  None  Electrophysiologist:  Virl Axe, MD       Patient Profile:   Brittany [name] is a 89 y.o. female who is being seen 03/20/2022 for the evaluation of preop eval for incarcerated inguinal hernia at the request of Dr. Florene Glen.  History of Present Illness:   Brittany [name] is a 81F known to have CAD s/p LAD angioplasty with normal LVEF, CHB s/p [employer] PPM and generator replacement in 2017, atrial flutter status post DCCV, currently on [facility] is here admitted with incarcerated inguinal hernia which was reduced by general surgery yesterday, 03/19/2022. Patient lives with her daughter, fairly inactive. She walks in her home with a walker, sometimes is aware of surroundings but also has some confusion. She denies any chest pain,dyspnea, edema, dizziness, palpitations. She had 1 episode of chest pain rating to left arm 4 to 5 months ago, unable to recall the duration. But it did not recur.   Past Medical History:  Diagnosis Date   Atrial flutter ([place])    CAD (coronary artery disease)    CHF (congestive heart failure) (HCC)    Complete heart block ([place])    a. s/p STJ dual chamber PPM   Depression    Glaucoma    Hyperlipidemia    Hypertension    Type II diabetes mellitus ([place])     Past Surgical History:  Procedure Laterality Date   BREAST BIOPSY  1991   ELECTROPHYSIOLOGIC STUDY N/A 02/12/2015   cardioversion by Dr Caryl Comes    EP IMPLANTABLE DEVICE N/A 04/07/2015   a. STJ dual chamber PPM implanted 2006 for complete heart block b. gen change 2017   FEMUR IM NAIL  01/18/2012   Procedure: INTRAMEDULLARY (IM) NAIL FEMORAL;  Surgeon: Marin Shutter, MD;  Location: [place];  Service: Orthopedics;  Laterality: Left;   left total knee replacement Bilateral 1996    TONSILLECTOMY  1937     Home Medications:  Prior to Admission medications   Medication Sig Start Date End Date Taking? Authorizing Provider  cyanocobalamin 1000 MCG tablet Take 1,000 mcg by mouth daily.   Yes [provider]  ELIQUIS 5 MG TABS tablet TAKE 1 TABLET TWICE A DAY Patient taking differently: Take 5 mg by mouth 2 (two) times daily. 09/05/17  Yes Deboraha Sprang, MD  furosemide (LASIX) 40 MG tablet Take 1 tablet (40 mg total) by mouth daily. 03/02/22  Yes Emokpae, Courage, MD  irbesartan (AVAPRO) 75 MG tablet Take 0.5 tablets (37.5 mg total) by mouth daily. 04/12/17  Yes Seiler, Amber K, NP  isosorbide mononitrate (IMDUR) 30 MG 24 hr tablet Take 1 tablet by mouth daily. 09/04/18  Yes [provider]  metFORMIN (GLUCOPHAGE-XR) 500 MG 24 hr tablet Take 500 mg by mouth daily with breakfast. 12/10/14  Yes [provider]  potassium chloride SA (KLOR-CON M) 20 MEQ tablet Take 20 mEq by mouth daily. 03/14/22  Yes [provider]  senna-docusate (SENOKOT-S) 8.6-50 MG tablet Take 2 tablets by mouth 2 (two) times daily. 03/02/22 03/02/23 Yes Roxan Hockey, MD    Inpatient Medications: Scheduled Meds:  insulin aspart  0-9 Units Subcutaneous Q4H   irbesartan  37.5 mg Oral Daily   senna-docusate  2 tablet Oral BID   Continuous Infusions:  sodium chloride 75 mL/hr at  03/20/22 0311   PRN Meds: acetaminophen **OR** acetaminophen, HYDROmorphone (DILAUDID) injection, prochlorperazine  Allergies:    Allergies  Allergen Reactions   Ciprofloxacin Other (See Comments)    Severe chest pains   Ace Inhibitors Other (See Comments)    REACTION: cough and sore throat   Cedax [Ceftibuten] Other (See Comments)    Possibly nausea/vomiting, cyanosis, severe headaches   Lipitor [Atorvastatin] Other (See Comments)    REACTION: Myalgia   Plavix [Clopidogrel Bisulfate] Other (See Comments)    Possibly nausea/vomiting, cyanosis, severe headaches     Statins Other (See  Comments)    Myalgias, cyanosis    Social History:   Social History   Socioeconomic History   Marital status: Divorced    Spouse name: Not on file   Number of children: 5   Years of education: Not on file   Highest education level: Not on file  Occupational History   Occupation: Retired  Tobacco Use   Smoking status: Former    Types: Cigarettes    Quit date: 01/17/1951    Years since quitting: 71.2   Smokeless tobacco: Never  Vaping Use   Vaping Use: Never used  Substance and Sexual Activity   Alcohol use: Yes    Comment: Occasional glass of wine   Drug use: No   Sexual activity: Not on file  Other Topics Concern   Not on file  Social History Narrative   Not on file   Social Determinants of Health   Financial Resource Strain: Not on file  Food Insecurity: No Food Insecurity (02/28/2022)   Hunger Vital Sign    Worried About Running Out of Food in the Last Year: Never true    Ran Out of Food in the Last Year: Never true  Transportation Needs: No Transportation Needs (02/28/2022)   PRAPARE - Hydrologist (Medical): No    Lack of Transportation (Non-Medical): No  Physical Activity: Not on file  Stress: Not on file  Social Connections: Not on file  Intimate Partner Violence: Not At Risk (02/28/2022)   Humiliation, Afraid, Rape, and Kick questionnaire    Fear of Current or Ex-Partner: No    Emotionally Abused: No    Physically Abused: No    Sexually Abused: No    Family History:     Family History  Problem Relation Age of Onset   Diabetes Mother    Heart attack Father    Stroke Father    Hypertension Father    Kidney disease Daughter      ROS:  Please see the history of present illness.  Review of Systems  Constitutional: Negative.  HENT: Negative.    Eyes: Negative.   Cardiovascular: Negative.   Respiratory: Negative.    Hematologic/Lymphatic: Negative.   Musculoskeletal: Negative.  Negative for joint pain.   Gastrointestinal:  Positive for abdominal pain, nausea and vomiting.  Genitourinary: Negative.   Neurological: Negative.     All other ROS reviewed and negative.     Physical Exam/Data:   Vitals:   03/19/22 2209 03/19/22 2210 03/20/22 0200 03/20/22 0815  BP:   (!) 131/56 109/74  Pulse:   70 74  Resp:    18  Temp:   97.7 F (36.5 C) 97.7 F (36.5 C)  TempSrc:   Oral Oral  SpO2: 96%  100% 100%  Weight:  66.5 kg    Height:  '4\' 11"'$  (1.499 m)      Intake/Output Summary (Last 24 hours) at  03/20/2022 1025 Last data filed at 03/20/2022 0800 Gross per 24 hour  Intake 1362.56 ml  Output --  Net 1362.56 ml      03/19/2022   10:10 PM 03/19/2022    5:14 PM 03/02/2022    6:21 AM  Last 3 Weights  Weight (lbs) 146 lb 9.7 oz 151 lb 10.8 oz 151 lb 10.8 oz  Weight (kg) 66.5 kg 68.8 kg 68.8 kg     Body mass index is 29.61 kg/m.  General:  Well nourished, well developed, in no acute distress  HEENT: normal Neck: no JVD Vascular: No carotid bruits; Distal pulses 2+ bilaterally Cardiac:  normal S1, S2; RRR; no murmur   Lungs:  clear to auscultation bilaterally, no wheezing, rhonchi or rales  Abd: soft, nontender, no hepatomegaly  Ext: no edema Musculoskeletal:  No deformities, BUE and BLE strength normal and equal Skin: warm and dry  Neuro:  CNs 2-12 intact, no focal abnormalities noted Psych:  Normal affect   EKG:  The EKG was personally reviewed and demonstrates:  Ventricular paced Telemetry:  Telemetry was personally reviewed and demonstrates:  V paced  Relevant CV Studies:  Echo 06/15/20 IMPRESSIONS     1. Left ventricular ejection fraction, by estimation, is 50 to 55%. The  left ventricle has low normal function. The left ventricle has no regional  wall motion abnormalities. There is moderate concentric left ventricular  hypertrophy. Left ventricular  diastolic parameters are indeterminate.   2. Right ventricular systolic function is normal. The right ventricular  size is  normal. There is severely elevated pulmonary artery systolic  pressure. The estimated right ventricular systolic pressure is [id number] mmHg.   3. Left atrial size was mild to moderately dilated.   4. The mitral valve is abnormal. Trivial mitral valve regurgitation. The  mean mitral valve gradient is 4.5 mmHg with average heart rate of 71 bpm.  Moderate to severe mitral annular calcification.   5. The aortic valve is tricuspid. There is mild calcification of the  aortic valve. There is mild thickening of the aortic valve. Aortic valve  regurgitation is mild to moderate. Mild aortic valve sclerosis is present,  with no evidence of aortic valve  stenosis.   6. The inferior vena cava is normal in size with <50% respiratory  variability, suggesting right atrial pressure of 8 mmHg.   Comparison(s): A prior study was performed on 01/03/2015. Prior images  reviewed side by side. Slight increase in tricuspid regurgitation and  aortic regurgitation; slight decrease in LVEF.    Laboratory Data:  High Sensitivity Troponin:  No results for input(s): "TROPONINIHS" in the last 720 hours.   Chemistry Recent Labs  Lab 03/19/22 1745 03/20/22 0435  NA 135 139  K 3.1* 4.0  CL 104 107  CO2 25 27  GLUCOSE 137* 84  BUN 10 10  CREATININE 0.69 0.62  CALCIUM 9.8 9.4  MG  --  2.0  GFRNONAA >60 >60  ANIONGAP 6 5    Recent Labs  Lab 03/19/22 1745 03/20/22 0435  PROT 7.1 5.9*  ALBUMIN 3.7 3.2*  AST 22 15  ALT 18 14  ALKPHOS 64 55  BILITOT 0.8 0.8   Lipids No results for input(s): "CHOL", "TRIG", "HDL", "LABVLDL", "LDLCALC", "CHOLHDL" in the last 168 hours.  Hematology Recent Labs  Lab 03/19/22 1745 03/20/22 0435  WBC 6.5 6.1  RBC 4.07 3.61*  HGB 12.1 10.8*  HCT 36.8 33.4*  MCV 90.4 92.5  MCH 29.7 29.9  MCHC 32.9 32.3  RDW 14.2 14.3  PLT 266 229   Thyroid No results for input(s): "TSH", "FREET4" in the last 168 hours.  BNPNo results for input(s): "BNP", "PROBNP" in the last 168 hours.   DDimer No results for input(s): "DDIMER" in the last 168 hours.   Radiology/Studies:  CT PELVIS WO CONTRAST  Result Date: 03/19/2022 CLINICAL DATA:  Hernia suspected, inguinal or femoral. Possible reduction. EXAM: CT PELVIS WITHOUT CONTRAST TECHNIQUE: Multidetector CT imaging of the pelvis was performed following the standard protocol without intravenous contrast. RADIATION DOSE REDUCTION: This exam was performed according to the departmental dose-optimization program which includes automated exposure control, adjustment of the mA and/or kV according to patient size and/or use of iterative reconstruction technique. COMPARISON:  CT abdomen and pelvis earlier today at 6:09 p.m. FINDINGS: Urinary Tract: Contrast from prior CT within the bladder. No acute abnormality. Bowel: Decreased distention of the cecum after reduction of the right inguinal hernia. No bowel loops are present in the right inguinal hernia. Mild wall thickening of the cecum has decreased in earlier today. The ileocecal valve and appendix (circa series 3/image 28) are no longer within the hernia. Normal appearance of the appendix. The visualized small bowel is normal in caliber. Vascular/Lymphatic: Arterial atherosclerotic calcification. No suspicious lymphadenopathy. Reproductive:  No mass or other significant abnormality Other: Decreased size of the right inguinal hernia which contains fat fluid. Small amount of free fluid in the pelvis. Musculoskeletal: ORIF left femur.  No acute fracture. IMPRESSION: 1. Decreased size of the right inguinal hernia which contains fat and fluid. No bowel loops are present in the right inguinal hernia. 2. Decreased distention and wall thickening of the cecum after reduction of the right inguinal hernia. The ileocecal valve and appendix are no longer within the hernia. These results were called by telephone at the time of interpretation on 03/19/2022 at 8:49 pm to provider [facility] , who verbally  acknowledged these results. Electronically Signed   By: Placido Sou M.D.   On: 03/19/2022 20:53   CT Abdomen Pelvis W Contrast  Result Date: 03/19/2022 CLINICAL DATA:  Bowel obstruction suspected EXAM: CT ABDOMEN AND PELVIS WITH CONTRAST TECHNIQUE: Multidetector CT imaging of the abdomen and pelvis was performed using the standard protocol following bolus administration of intravenous contrast. RADIATION DOSE REDUCTION: This exam was performed according to the departmental dose-optimization program which includes automated exposure control, adjustment of the mA and/or kV according to patient size and/or use of iterative reconstruction technique. CONTRAST:  117m OMNIPAQUE IOHEXOL 300 MG/ML  SOLN COMPARISON:  CT angio chest 01/05/2005 report without imaging, CT chest 10/28/2012 report without imaging FINDINGS: Lower chest: Centrally calcified 14 mm left lower lobe pulmonary nodule consistent with described chronic granuloma. Mild bronchial wall thickening. Mitral annular calcifications. Partially visualized cardiac leads. Possible tiny hiatal hernia. Hepatobiliary: No focal liver abnormality. Multiple gallstones noted within the gallbladder lumen. No gallbladder wall thickening or pericholecystic fluid. No biliary dilatation. Pancreas: Multiple fluid density lesions within the pancreas with large its along the pancreatic head measuring 2.6 by 2.1 cm (2:24). Normal pancreatic contour. No surrounding inflammatory changes. No main pancreatic ductal dilatation. Spleen: Diffuse punctate calcifications consistent with sequelae of prior granulomatous disease. Normal in size without focal abnormality. Adrenals/Urinary Tract: No adrenal nodule bilaterally. Bilateral kidneys enhance symmetrically. Subcentimeter hypodensities too small to characterize-no further follow-up indicated. No hydronephrosis. No hydroureter. The urinary bladder is unremarkable. On delayed imaging, there is no urothelial wall thickening and  there are no filling defects in the opacified portions of  the bilateral collecting systems or ureters. Stomach/Bowel: Stomach is within normal limits. Dilated loop of cecum measuring up to 6cm with associated surrounding free fluid contained within the right inguinal hernia. The ileocecal valve as well as base of the appendix is noted within the hernia. Proximal and distal transition points are noted at the entry and exit sites of the inguinal hernia. No evidence of small bowel wall thickening or dilatation. Few scattered colonic diverticula. Appendix appears normal. Vascular/Lymphatic: No abdominal aorta or iliac aneurysm. Severe atherosclerotic plaque of the aorta and its branches. No abdominal, pelvic, or inguinal lymphadenopathy. Reproductive: Uterus and bilateral adnexa are unremarkable. Other: Trace volume free fluid within the right inguinal hernia. No intraperitoneal free gas. No organized fluid collection. Musculoskeletal: Moderate volume right inguinal hernia containing a short loop of dilated terminal ileum with an abdominal defect of 2.3 cm. Free fluid is also noted within the hernia. Tiny fat containing umbilical hernia. No suspicious lytic or blastic osseous lesions. No acute displaced fracture. Multilevel degenerative changes of the spine as well as multilevel spondylolisthesis. Intramedullary nail fixation of the left femur. IMPRESSION: 1. Incarcerated right inguinal hernia leading to a cecal closed loop obstruction. The ileocecal valve and base of the appendix are also located within the hernia. Findings concerning for ischemia. Recommend emergent surgical consultation. 2. Colonic diverticulosis with no acute diverticulitis. 3. Cholelithiasis with no CT evidence of acute cholecystitis. 4. Multiple fluid density pancreatic lesions measuring up to 2.6 cm. 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 Correlation with more recent images would be of value to evaluate stability. Given patient's age, no further follow-up  indicated at this time. When the patient is clinically stable and able to follow directions and hold their breath (preferably as an outpatient) further evaluation with dedicated outpatient MRI pancreatic protocol could be considered on a case by case basis. 5. Aortic Atherosclerosis (ICD10-I70.0) including mitral annular calcifications. 6. Sequelae of prior granulomatous disease. These results were called by telephone at the time of interpretation on 03/19/2022 at 8:14 pm to provider [facility] , who verbally acknowledged these results. Electronically Signed   By: Iven Finn M.D.   On: 03/19/2022 20:17     Assessment and Plan:   # Preop cardiac risk stratification for incarcerated right inguinal hernia under general anesthesia -Patient had 1 episode of chest pain radiating to left arm approximately 4 to 5 months ago and had no recurrence since then. No DOE. Echocardiogram from 06/2020 showed LVEF 50% with mild to moderate AI. No evidence of aortic valve stenosis. Will update echocardiogram due to the presence of moderate AI in 2022. Due to her advanced age, she will be at an elevated risk for any perioperative cardiac complications. No further cardiac testing like stress test is indicated prior to proceeding with the planned procedure. -Hold Eliquis 2 to 3 days prior to the surgery -Patient has a [employer] PPM in place, device is approaching ERI.  As the surgery is below the umbilical line, need not apply the magnet.  However if there is any increased risk of electromagnetic interference from the procedure, it is safer to apply the magnet for asynchronous pacing. Need to call the [facility] scientific rep pre and post surgery (if magnet is applied). His contact info, [place], [phone].  # CAD with prior LAD POBA-not on aspirin due to Eliquis use, 1 sporadic episode of chest pain, no further testing is indicated.  # Atrial flutter/tachcardia on reduced carvedilol b/c of low  BP-eliquis on hold  # Permanent pacemaker  for CHB approaching ERI-4.3 months  # Chronic diastolic CHF, LVEF [id number] compensated  # Mild to moderate AI: Update Echocardiogram  I have spent a total of 60 minutes with patient reviewing chart , telemetry, EKGs, labs and examining patient as well as establishing an assessment and plan that was discussed with the patient.  > 50% of time was spent in direct patient care.    Cathren Sween Fidel Levy, MD [place]  12:10 PM

## 2022-03-20 NOTE — Progress Notes (Signed)
PROGRESS NOTE    Brittany [name]  [id number] DOB: 03-28-1927 DOA: 03/19/2022 PCP: Bernerd Limbo, MD  Chief Complaint  Patient presents with   Hernia    Brief Narrative:   [name] is [name] 89 y.o. female with medical history significant of hypertension, hyperlipidemia, history of atrial flutter, complete heart block status post pacemaker implantation, type 2 diabetes mellitus, chronic diastolic heart failure who presented to the emergency department from home via EMS due to pain in the right groin area.  Patient has Brittany [name] history of right inguinal hernia which has been ongoing for about the ED and was usually reducible when she lays down in the past, she weighs [name] belly binder which helps with prevention of protrusion of the hernia.  However, she presented with right groin pain this morning with difficulty in being able to reduce the hernia.  EMS was activated and patient was taken to the ED for further evaluation and management.  Patient has had Brittany [name] bowel movement this morning, she took Eliquis this morning and she denies chest pain, shortness of breath, nausea, vomiting.   ED Course:  In the emergency department, patient was hemodynamically stable.  Workup in the ED showed normal CBC and BMP except for hypokalemia and CBG of 137, lactic acid 2.2. CT abdomen and pelvis with contrast showed incarcerated right inguinal hernia limits physical closed-loop obstruction.  The ileocecal valve and base of the appendix are also located within the hernia. Findings concerning for ischemia.  Colonic diverticulosis with no acute diverticulitis.  Cholelithiasis with no CT evidence of acute cholecystitis.  Multiple fluid density pancreatic lesions measuring up to 2.6 cm. 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 CT pelvis without contrast showed: 1. Decreased size of the right inguinal hernia which contains fat and fluid. No bowel loops are present in the right inguinal hernia. 2. Decreased distention and wall thickening of  the cecum after reduction of the right inguinal hernia. The ileocecal valve and appendix are no longer within the hernia. ED physician was unable to reduce the hernia, general surgery (Dr. Constance Haw) was consulted, hernia was reduced as shown in reported CT of pelvis above.  Admission by hospitalist overnight with patient being placed n.p.o. at midnight and preop eval by cardiologist in the morning was recommended.    Assessment & Plan:   Principal Problem:   Incarcerated right inguinal hernia Active Problems:   Essential hypertension   PACEMAKER, PERMANENT   Chronic diastolic CHF (congestive heart failure) (HCC)   Atrial flutter, chronic (HCC)   Hypokalemia   Lactic acidosis   LBBB (left bundle branch block)   Type 2 diabetes mellitus with hyperglycemia (HCC)   Constipation   Obesity (BMI 30-39.9)   Prolonged QT interval  Incarcerated right inguinal hernia Required over 1 hour to reduce Plan for repair by general surgery Appreciate cards risk stratification - increased risk for any perioperative cardiac complications --- echo as below, hold eliquis 2-3 days preop, no need to place magnet as surgery below umbilical line, but if increased risk of EM interference, can call rep to application of magnet   Hypokalemia Improved, trend   Prolonged QT interval Paced rhythm Avoid QT prolonging drugs Mag 2   Lactic acidosis-resolved   Pancreatic lesions CT abdomen pelvis with contrast showed multiple fluid density pancreatic lesions measuring up to 2.6 cm. 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 No further follow-up indicated at this time per radiologist's recommendation  When the patient is clinically stable and able to follow directions and hold their breath (  preferably as an outpatient) further evaluation with dedicated outpatient MRI pancreatic protocol could be considered on Brittany [name] case by case basis per radiologist's recommendation   CAD  On eliquis for flutter  Atrial flutter Continue  telemetry Eliquis on hold with plan for surgery   Type 2 diabetes mellitus Hemoglobin A1c on 02/28/2022 was 5.9 Metformin will be held at this time Continue ISS and hypoglycemia protocol   Chronic diastolic congestive heart failure Stable Continue total input/output, daily weights and fluid restriction Continue heart healthy/modified carb diet         LBBB S/p pacemaker implantation Last reviewed remotely on 03/06/2022 with report for battery approaching elective replacement indicator Continue telemetry   Essential hypertension Continue irbesartan   Constipation Continue senna - docusate    DVT prophylaxis: SCD Code Status: DNR/DNI Family Communication: none  Disposition:   Status is: Inpatient Remains inpatient appropriate because: continued surgical eval   Consultants:  Cardiology Surgery `  Procedures:  Echo IMPRESSIONS     1. Left ventricular ejection fraction, by estimation, is 60 to 65%. The  left ventricle has normal function. The left ventricle has no regional  wall motion abnormalities. There is mild left ventricular hypertrophy.  Left ventricular diastolic function  could not be evaluated.   2. Right ventricular systolic function is low normal. The right  ventricular size is normal. There is mildly elevated pulmonary artery  systolic pressure. The estimated right ventricular systolic pressure is  99991111 mmHg.   3. Left atrial size was severely dilated.   4. The mitral valve is abnormal. Trivial mitral valve regurgitation. Mild  mitral stenosis. The mean mitral valve gradient is 3.0 mmHg. Severe mitral  annular calcification   5. The aortic valve is tricuspid. Aortic valve regurgitation is mild to  moderate. No aortic stenosis is present.   6. Aortic dilatation noted. There is borderline dilatation of the aortic  root, measuring 37 mm.   7. The inferior vena cava is dilated in size with >50% respiratory  variability, suggesting right atrial pressure  of 8 mmHg.   Comparison(s): No significant change from prior study.    Antimicrobials:  Anti-infectives (From admission, onward)    None       Subjective: No new complaints  Objective: Vitals:   03/19/22 2210 03/20/22 0200 03/20/22 0815 03/20/22 1153  BP:  (!) 131/56 109/74 (!) 139/57  Pulse:  70 74 76  Resp:   18 18  Temp:  97.7 F (36.5 C) 97.7 F (36.5 C) 97.7 F (36.5 C)  TempSrc:  Oral Oral Oral  SpO2:  100% 100% 100%  Weight: 66.5 kg     Height: '4\' 11"'$  (1.499 m)       Intake/Output Summary (Last 24 hours) at 03/20/2022 1743 Last data filed at 03/20/2022 1415 Gross per 24 hour  Intake 1602.56 ml  Output 1 ml  Net 1601.56 ml   Filed Weights   03/19/22 1714 03/19/22 2210  Weight: 68.8 kg 66.5 kg    Examination:  General exam: Appears calm and comfortable  Respiratory system: unlabored Cardiovascular system: RRR Gastrointestinal system: Abdomen is nondistended, soft and nontender Central nervous system: Alert and oriented. No focal neurological deficits. Extremities: no LEE   Data Reviewed: I have personally reviewed following labs and imaging studies  CBC: Recent Labs  Lab 03/19/22 1745 03/20/22 0435  WBC 6.5 6.1  NEUTROABS 3.9  --   HGB 12.1 10.8*  HCT 36.8 33.4*  MCV 90.4 92.5  PLT 266 229  Basic Metabolic Panel: Recent Labs  Lab 03/19/22 1745 03/20/22 0435  NA 135 139  K 3.1* 4.0  CL 104 107  CO2 25 27  GLUCOSE 137* 84  BUN 10 10  CREATININE 0.69 0.62  CALCIUM 9.8 9.4  MG  --  2.0  PHOS  --  2.8    GFR: Estimated Creatinine Clearance: 36.4 mL/min (by C-G formula based on SCr of 0.62 mg/dL).  Liver Function Tests: Recent Labs  Lab 03/19/22 1745 03/20/22 0435  AST 22 15  ALT 18 14  ALKPHOS 64 55  BILITOT 0.8 0.8  PROT 7.1 5.9*  ALBUMIN 3.7 3.2*    CBG: Recent Labs  Lab 03/19/22 2327 03/20/22 0342 03/20/22 1131 03/20/22 1602  GLUCAP 144* 82 87 117*     No results found for this or any previous visit  (from the past 240 hour(s)).       Radiology Studies: ECHOCARDIOGRAM COMPLETE  Result Date: 03/20/2022    ECHOCARDIOGRAM REPORT   Patient Name:   MAKAILYNN HURLOCK Knapke Date of Exam: 03/20/2022 Medical Rec #:  [id number]       Height:       59.0 in Accession #:    [id number]      Weight:       146.6 lb Date of Birth:  27-Aug-1927       BSA:          1.616 m Patient Age:    58 years        BP:           139/57 mmHg Patient Gender: F               HR:           76 bpm. Exam Location:  [facility] Procedure: 2D Echo, Cardiac Doppler and Color Doppler Indications:    Aortic regurgitation I35.1  History:        Patient has prior history of Echocardiogram examinations, most                 recent 06/15/2020. CHF, CAD, Pacemaker, Arrythmias:Atrial Flutter                 and LBBB; Risk Factors:Hypertension, Diabetes and Dyslipidemia.  Sonographer:    Alvino Chapel RCS Referring Phys: [id number] VISHNU P MALLIPEDDI IMPRESSIONS  1. Left ventricular ejection fraction, by estimation, is 60 to 65%. The left ventricle has normal function. The left ventricle has no regional wall motion abnormalities. There is mild left ventricular hypertrophy. Left ventricular diastolic function could not be evaluated.  2. Right ventricular systolic function is low normal. The right ventricular size is normal. There is mildly elevated pulmonary artery systolic pressure. The estimated right ventricular systolic pressure is [id number] mmHg.  3. Left atrial size was severely dilated.  4. The mitral valve is abnormal. Trivial mitral valve regurgitation. Mild mitral stenosis. The mean mitral valve gradient is 3.0 mmHg. Severe mitral annular calcification  5. The aortic valve is tricuspid. Aortic valve regurgitation is mild to moderate. No aortic stenosis is present.  6. Aortic dilatation noted. There is borderline dilatation of the aortic root, measuring 37 mm.  7. The inferior vena cava is dilated in size with >50% respiratory variability, suggesting right atrial  pressure of 8 mmHg. Comparison(s): No significant change from prior study. FINDINGS  Left Ventricle: Left ventricular ejection fraction, by estimation, is 60 to 65%. The left ventricle has normal function. The left ventricle has no regional wall motion abnormalities.  The left ventricular internal cavity size was normal in size. There is  mild left ventricular hypertrophy. Left ventricular diastolic function could not be evaluated due to paced rhythm. Left ventricular diastolic function could not be evaluated. Right Ventricle: The right ventricular size is normal. No increase in right ventricular wall thickness. Right ventricular systolic function is low normal. There is mildly elevated pulmonary artery systolic pressure. The tricuspid regurgitant velocity is 2.91 m/s, and with an assumed right atrial pressure of 8 mmHg, the estimated right ventricular systolic pressure is 99991111 mmHg. Left Atrium: Left atrial size was severely dilated. Right Atrium: Right atrial size was normal in size. Pericardium: There is no evidence of pericardial effusion. Mitral Valve: The mitral valve is abnormal. Severe mitral annular calcification. Trivial mitral valve regurgitation. Mild mitral valve stenosis. MV peak gradient, 11.8 mmHg. The mean mitral valve gradient is 3.0 mmHg. Tricuspid Valve: The tricuspid valve is abnormal. Tricuspid valve regurgitation is mild . No evidence of tricuspid stenosis. Aortic Valve: The aortic valve is tricuspid. Aortic valve regurgitation is mild to moderate. Aortic regurgitation PHT measures 387 msec. No aortic stenosis is present. Pulmonic Valve: The pulmonic valve was not well visualized. Pulmonic valve regurgitation is mild. No evidence of pulmonic stenosis. Aorta: Aortic dilatation noted. There is borderline dilatation of the aortic root, measuring 37 mm. Venous: The inferior vena cava is dilated in size with greater than 50% respiratory variability, suggesting right atrial pressure of 8 mmHg.  IAS/Shunts: No atrial level shunt detected by color flow Doppler. Additional Comments: Malory Spurr device lead is visualized in the right ventricle and right atrium.  LEFT VENTRICLE PLAX 2D LVIDd:         4.60 cm LVIDs:         3.20 cm LV PW:         1.20 cm LV IVS:        1.20 cm LVOT diam:     2.00 cm LV SV:         64 LV SV Index:   39 LVOT Area:     3.14 cm  RIGHT VENTRICLE TAPSE (M-mode): 1.9 cm LEFT ATRIUM              Index        RIGHT ATRIUM           Index LA diam:        4.65 cm  2.88 cm/m   RA Area:     18.20 cm LA Vol (A2C):   106.0 ml 65.58 ml/m  RA Volume:   48.90 ml  30.25 ml/m LA Vol (A4C):   123.0 ml 76.10 ml/m LA Biplane Vol: 116.0 ml 71.77 ml/m  AORTIC VALVE LVOT Vmax:   89.50 cm/s LVOT Vmean:  58.900 cm/s LVOT VTI:    0.203 m AI PHT:      387 msec  AORTA Ao Root diam: 3.70 cm MITRAL VALVE                TRICUSPID VALVE MV Area (PHT): 3.17 cm     TR Peak grad:   33.9 mmHg MV Area VTI:   1.79 cm     TR Vmax:        291.00 cm/s MV Peak grad:  11.8 mmHg MV Mean grad:  3.0 mmHg     SHUNTS MV Vmax:       1.72 m/s     Systemic VTI:  0.20 m MV Vmean:      70.9 cm/s  Systemic Diam: 2.00 cm MV Decel Time: 239 msec MV E velocity: 147.00 cm/s MV Tishanna Dunford velocity: 36.70 cm/s MV E/Teancum Brule ratio:  4.01 Vishnu Priya Mallipeddi Electronically signed by Lorelee Cover Mallipeddi Signature Date/Time: 03/20/2022/3:57:23 PM    Final    CT PELVIS WO CONTRAST  Result Date: 03/19/2022 CLINICAL DATA:  Hernia suspected, inguinal or femoral. Possible reduction. EXAM: CT PELVIS WITHOUT CONTRAST TECHNIQUE: Multidetector CT imaging of the pelvis was performed following the standard protocol without intravenous contrast. RADIATION DOSE REDUCTION: This exam was performed according to the departmental dose-optimization program which includes automated exposure control, adjustment of the mA and/or kV according to patient size and/or use of iterative reconstruction technique. COMPARISON:  CT abdomen and pelvis earlier today at 6:09 p.m.  FINDINGS: Urinary Tract: Contrast from prior CT within the bladder. No acute abnormality. Bowel: Decreased distention of the cecum after reduction of the right inguinal hernia. No bowel loops are present in the right inguinal hernia. Mild wall thickening of the cecum has decreased in earlier today. The ileocecal valve and appendix (circa series 3/image 28) are no longer within the hernia. Normal appearance of the appendix. The visualized small bowel is normal in caliber. Vascular/Lymphatic: Arterial atherosclerotic calcification. No suspicious lymphadenopathy. Reproductive:  No mass or other significant abnormality Other: Decreased size of the right inguinal hernia which contains fat fluid. Small amount of free fluid in the pelvis. Musculoskeletal: ORIF left femur.  No acute fracture. IMPRESSION: 1. Decreased size of the right inguinal hernia which contains fat and fluid. No bowel loops are present in the right inguinal hernia. 2. Decreased distention and wall thickening of the cecum after reduction of the right inguinal hernia. The ileocecal valve and appendix are no longer within the hernia. These results were called by telephone at the time of interpretation on 03/19/2022 at 8:49 pm to provider [facility] , who verbally acknowledged these results. Electronically Signed   By: Placido Sou M.D.   On: 03/19/2022 20:53   CT Abdomen Pelvis W Contrast  Result Date: 03/19/2022 CLINICAL DATA:  Bowel obstruction suspected EXAM: CT ABDOMEN AND PELVIS WITH CONTRAST TECHNIQUE: Multidetector CT imaging of the abdomen and pelvis was performed using the standard protocol following bolus administration of intravenous contrast. RADIATION DOSE REDUCTION: This exam was performed according to the departmental dose-optimization program which includes automated exposure control, adjustment of the mA and/or kV according to patient size and/or use of iterative reconstruction technique. CONTRAST:  153m OMNIPAQUE IOHEXOL 300  MG/ML  SOLN COMPARISON:  CT angio chest 01/05/2005 report without imaging, CT chest 10/28/2012 report without imaging FINDINGS: Lower chest: Centrally calcified 14 mm left lower lobe pulmonary nodule consistent with described chronic granuloma. Mild bronchial wall thickening. Mitral annular calcifications. Partially visualized cardiac leads. Possible tiny hiatal hernia. Hepatobiliary: No focal liver abnormality. Multiple gallstones noted within the gallbladder lumen. No gallbladder wall thickening or pericholecystic fluid. No biliary dilatation. Pancreas: Multiple fluid density lesions within the pancreas with large its along the pancreatic head measuring 2.6 by 2.1 cm (2:24). Normal pancreatic contour. No surrounding inflammatory changes. No main pancreatic ductal dilatation. Spleen: Diffuse punctate calcifications consistent with sequelae of prior granulomatous disease. Normal in size without focal abnormality. Adrenals/Urinary Tract: No adrenal nodule bilaterally. Bilateral kidneys enhance symmetrically. Subcentimeter hypodensities too small to characterize-no further follow-up indicated. No hydronephrosis. No hydroureter. The urinary bladder is unremarkable. On delayed imaging, there is no urothelial wall thickening and there are no filling defects in the opacified portions of the bilateral collecting systems or ureters.  Stomach/Bowel: Stomach is within normal limits. Dilated loop of cecum measuring up to 6cm with associated surrounding free fluid contained within the right inguinal hernia. The ileocecal valve as well as base of the appendix is noted within the hernia. Proximal and distal transition points are noted at the entry and exit sites of the inguinal hernia. No evidence of small bowel wall thickening or dilatation. Few scattered colonic diverticula. Appendix appears normal. Vascular/Lymphatic: No abdominal aorta or iliac aneurysm. Severe atherosclerotic plaque of the aorta and its branches. No  abdominal, pelvic, or inguinal lymphadenopathy. Reproductive: Uterus and bilateral adnexa are unremarkable. Other: Trace volume free fluid within the right inguinal hernia. No intraperitoneal free gas. No organized fluid collection. Musculoskeletal: Moderate volume right inguinal hernia containing Brittin Janik short loop of dilated terminal ileum with an abdominal defect of 2.3 cm. Free fluid is also noted within the hernia. Tiny fat containing umbilical hernia. No suspicious lytic or blastic osseous lesions. No acute displaced fracture. Multilevel degenerative changes of the spine as well as multilevel spondylolisthesis. Intramedullary nail fixation of the left femur. IMPRESSION: 1. Incarcerated right inguinal hernia leading to Anes Rigel cecal closed loop obstruction. The ileocecal valve and base of the appendix are also located within the hernia. Findings concerning for ischemia. Recommend emergent surgical consultation. 2. Colonic diverticulosis with no acute diverticulitis. 3. Cholelithiasis with no CT evidence of acute cholecystitis. 4. Multiple fluid density pancreatic lesions measuring up to 2.6 cm. 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 Correlation with more recent images would be of value to evaluate stability. Given patient's age, no further follow-up indicated at this time. When the patient is clinically stable and able to follow directions and hold their breath (preferably as an outpatient) further evaluation with dedicated outpatient MRI pancreatic protocol could be considered on Aleila Syverson case by case basis. 5. Aortic Atherosclerosis (ICD10-I70.0) including mitral annular calcifications. 6. Sequelae of prior granulomatous disease. These results were called by telephone at the time of interpretation on 03/19/2022 at 8:14 pm to provider [facility] , who verbally acknowledged these results. Electronically Signed   By: Iven Finn M.D.   On: 03/19/2022 20:17        Scheduled Meds:  insulin aspart  0-9 Units Subcutaneous Q4H    irbesartan  37.5 mg Oral Daily   senna-docusate  2 tablet Oral BID   Continuous Infusions:   LOS: 1 day    Time spent: over 30 min    Fayrene Helper, MD [facility]   To contact the attending provider between 7A-7P or the covering provider during after hours 7P-7A, please log into the web site [email or web address] and access using universal [place] password for that web site. If you do not have the password, please call the hospital operator.  03/20/2022, 5:43 PM

## 2022-03-20 NOTE — Progress Notes (Signed)
[facility] Progress Note     Subjective: Seen earlier. Doing well. No pain. Hernia still reduced.   Objective: Vital signs in last 24 hours: Temp:  [97.7 F (36.5 C)-98 F (36.7 C)] 97.7 F (36.5 C) (03/11 1153) Pulse Rate:  [70-79] 76 (03/11 1153) Resp:  [10-26] 18 (03/11 1153) BP: (109-161)/(52-115) 139/57 (03/11 1153) SpO2:  [89 %-100 %] 100 % (03/11 1153) FiO2 (%):  [28 %] 28 % (03/10 2209) Weight:  [66.5 kg] 66.5 kg (03/10 2210) Last BM Date : 03/19/22  Intake/Output from previous day: 03/10 0701 - 03/11 0700 In: 1362.6 [I.V.:362.6; IV Piggyback:1000] Out: -  Intake/Output this shift: Total I/O In: 240 [P.O.:240] Out: 1 [Stool:1]  General appearance: alert and no distress GI: soft, non-tender; bowel sounds normal; no masses,  no organomegaly, reduced hernia  Lab Results:  Recent Labs    03/19/22 1745 03/20/22 0435  WBC 6.5 6.1  HGB 12.1 10.8*  HCT 36.8 33.4*  PLT 266 229   BMET Recent Labs    03/19/22 1745 03/20/22 0435  NA 135 139  K 3.1* 4.0  CL 104 107  CO2 25 27  GLUCOSE 137* 84  BUN 10 10  CREATININE 0.69 0.62  CALCIUM 9.8 9.4   PT/INR No results for input(s): "LABPROT", "INR" in the last 72 hours.  Studies/Results: ECHOCARDIOGRAM COMPLETE  Result Date: 03/20/2022    ECHOCARDIOGRAM REPORT   Patient Name:   Brittany [name] Date of Exam: 03/20/2022 Medical Rec #:  [id number]       Height:       59.0 in Accession #:    [id number]      Weight:       146.6 lb Date of Birth:  1927/09/09       BSA:          1.616 m Patient Age:    89 years        BP:           139/57 mmHg Patient Gender: F               HR:           76 bpm. Exam Location:  [facility] Procedure: 2D Echo, Cardiac Doppler and Color Doppler Indications:    Aortic regurgitation I35.1  History:        Patient has prior history of Echocardiogram examinations, most                 recent 06/15/2020. CHF, CAD, Pacemaker, Arrythmias:Atrial Flutter                 and LBBB; Risk  Factors:Hypertension, Diabetes and Dyslipidemia.  Sonographer:    Alvino Chapel RCS Referring Phys: [id number] VISHNU P MALLIPEDDI IMPRESSIONS  1. Left ventricular ejection fraction, by estimation, is 60 to 65%. The left ventricle has normal function. The left ventricle has no regional wall motion abnormalities. There is mild left ventricular hypertrophy. Left ventricular diastolic function could not be evaluated.  2. Right ventricular systolic function is low normal. The right ventricular size is normal. There is mildly elevated pulmonary artery systolic pressure. The estimated right ventricular systolic pressure is [id number] mmHg.  3. Left atrial size was severely dilated.  4. The mitral valve is abnormal. Trivial mitral valve regurgitation. Mild mitral stenosis. The mean mitral valve gradient is 3.0 mmHg. Severe mitral annular calcification  5. The aortic valve is tricuspid. Aortic valve regurgitation is mild to moderate. No aortic stenosis is present.  6. Aortic dilatation noted. There is borderline dilatation of the aortic root, measuring 37 mm.  7. The inferior vena cava is dilated in size with >50% respiratory variability, suggesting right atrial pressure of 8 mmHg. Comparison(s): No significant change from prior study. FINDINGS  Left Ventricle: Left ventricular ejection fraction, by estimation, is 60 to 65%. The left ventricle has normal function. The left ventricle has no regional wall motion abnormalities. The left ventricular internal cavity size was normal in size. There is  mild left ventricular hypertrophy. Left ventricular diastolic function could not be evaluated due to paced rhythm. Left ventricular diastolic function could not be evaluated. Right Ventricle: The right ventricular size is normal. No increase in right ventricular wall thickness. Right ventricular systolic function is low normal. There is mildly elevated pulmonary artery systolic pressure. The tricuspid regurgitant velocity is 2.91 m/s, and  with an assumed right atrial pressure of 8 mmHg, the estimated right ventricular systolic pressure is [id number] mmHg. Left Atrium: Left atrial size was severely dilated. Right Atrium: Right atrial size was normal in size. Pericardium: There is no evidence of pericardial effusion. Mitral Valve: The mitral valve is abnormal. Severe mitral annular calcification. Trivial mitral valve regurgitation. Mild mitral valve stenosis. MV peak gradient, 11.8 mmHg. The mean mitral valve gradient is 3.0 mmHg. Tricuspid Valve: The tricuspid valve is abnormal. Tricuspid valve regurgitation is mild . No evidence of tricuspid stenosis. Aortic Valve: The aortic valve is tricuspid. Aortic valve regurgitation is mild to moderate. Aortic regurgitation PHT measures 387 msec. No aortic stenosis is present. Pulmonic Valve: The pulmonic valve was not well visualized. Pulmonic valve regurgitation is mild. No evidence of pulmonic stenosis. Aorta: Aortic dilatation noted. There is borderline dilatation of the aortic root, measuring 37 mm. Venous: The inferior vena cava is dilated in size with greater than 50% respiratory variability, suggesting right atrial pressure of 8 mmHg. IAS/Shunts: No atrial level shunt detected by color flow Doppler. Additional Comments: A device lead is visualized in the right ventricle and right atrium.  LEFT VENTRICLE PLAX 2D LVIDd:         4.60 cm LVIDs:         3.20 cm LV PW:         1.20 cm LV IVS:        1.20 cm LVOT diam:     2.00 cm LV SV:         64 LV SV Index:   39 LVOT Area:     3.14 cm  RIGHT VENTRICLE TAPSE (M-mode): 1.9 cm LEFT ATRIUM              Index        RIGHT ATRIUM           Index LA diam:        4.65 cm  2.88 cm/m   RA Area:     18.20 cm LA Vol (A2C):   106.0 ml 65.58 ml/m  RA Volume:   48.90 ml  30.25 ml/m LA Vol (A4C):   123.0 ml 76.10 ml/m LA Biplane Vol: 116.0 ml 71.77 ml/m  AORTIC VALVE LVOT Vmax:   89.50 cm/s LVOT Vmean:  58.900 cm/s LVOT VTI:    0.203 m AI PHT:      387 msec  AORTA Ao  Root diam: 3.70 cm MITRAL VALVE                TRICUSPID VALVE MV Area (PHT): 3.17 cm     TR Peak grad:  33.9 mmHg MV Area VTI:   1.79 cm     TR Vmax:        291.00 cm/s MV Peak grad:  11.8 mmHg MV Mean grad:  3.0 mmHg     SHUNTS MV Vmax:       1.72 m/s     Systemic VTI:  0.20 m MV Vmean:      70.9 cm/s    Systemic Diam: 2.00 cm MV Decel Time: 239 msec MV E velocity: 147.00 cm/s MV A velocity: 36.70 cm/s MV E/A ratio:  4.01 Vishnu Priya Mallipeddi Electronically signed by Lorelee Cover Mallipeddi Signature Date/Time: 03/20/2022/3:57:23 PM    Final    CT PELVIS WO CONTRAST  Result Date: 03/19/2022 CLINICAL DATA:  Hernia suspected, inguinal or femoral. Possible reduction. EXAM: CT PELVIS WITHOUT CONTRAST TECHNIQUE: Multidetector CT imaging of the pelvis was performed following the standard protocol without intravenous contrast. RADIATION DOSE REDUCTION: This exam was performed according to the departmental dose-optimization program which includes automated exposure control, adjustment of the mA and/or kV according to patient size and/or use of iterative reconstruction technique. COMPARISON:  CT abdomen and pelvis earlier today at 6:09 p.m. FINDINGS: Urinary Tract: Contrast from prior CT within the bladder. No acute abnormality. Bowel: Decreased distention of the cecum after reduction of the right inguinal hernia. No bowel loops are present in the right inguinal hernia. Mild wall thickening of the cecum has decreased in earlier today. The ileocecal valve and appendix (circa series 3/image 28) are no longer within the hernia. Normal appearance of the appendix. The visualized small bowel is normal in caliber. Vascular/Lymphatic: Arterial atherosclerotic calcification. No suspicious lymphadenopathy. Reproductive:  No mass or other significant abnormality Other: Decreased size of the right inguinal hernia which contains fat fluid. Small amount of free fluid in the pelvis. Musculoskeletal: ORIF left femur.  No acute  fracture. IMPRESSION: 1. Decreased size of the right inguinal hernia which contains fat and fluid. No bowel loops are present in the right inguinal hernia. 2. Decreased distention and wall thickening of the cecum after reduction of the right inguinal hernia. The ileocecal valve and appendix are no longer within the hernia. These results were called by telephone at the time of interpretation on 03/19/2022 at 8:49 pm to provider [facility] , who verbally acknowledged these results. Electronically Signed   By: Placido Sou M.D.   On: 03/19/2022 20:53   CT Abdomen Pelvis W Contrast  Result Date: 03/19/2022 CLINICAL DATA:  Bowel obstruction suspected EXAM: CT ABDOMEN AND PELVIS WITH CONTRAST TECHNIQUE: Multidetector CT imaging of the abdomen and pelvis was performed using the standard protocol following bolus administration of intravenous contrast. RADIATION DOSE REDUCTION: This exam was performed according to the departmental dose-optimization program which includes automated exposure control, adjustment of the mA and/or kV according to patient size and/or use of iterative reconstruction technique. CONTRAST:  153m OMNIPAQUE IOHEXOL 300 MG/ML  SOLN COMPARISON:  CT angio chest 01/05/2005 report without imaging, CT chest 10/28/2012 report without imaging FINDINGS: Lower chest: Centrally calcified 14 mm left lower lobe pulmonary nodule consistent with described chronic granuloma. Mild bronchial wall thickening. Mitral annular calcifications. Partially visualized cardiac leads. Possible tiny hiatal hernia. Hepatobiliary: No focal liver abnormality. Multiple gallstones noted within the gallbladder lumen. No gallbladder wall thickening or pericholecystic fluid. No biliary dilatation. Pancreas: Multiple fluid density lesions within the pancreas with large its along the pancreatic head measuring 2.6 by 2.1 cm (2:24). Normal pancreatic contour. No surrounding inflammatory changes. No main pancreatic ductal dilatation.  Spleen: Diffuse punctate calcifications consistent with sequelae of prior granulomatous disease. Normal in size without focal abnormality. Adrenals/Urinary Tract: No adrenal nodule bilaterally. Bilateral kidneys enhance symmetrically. Subcentimeter hypodensities too small to characterize-no further follow-up indicated. No hydronephrosis. No hydroureter. The urinary bladder is unremarkable. On delayed imaging, there is no urothelial wall thickening and there are no filling defects in the opacified portions of the bilateral collecting systems or ureters. Stomach/Bowel: Stomach is within normal limits. Dilated loop of cecum measuring up to 6cm with associated surrounding free fluid contained within the right inguinal hernia. The ileocecal valve as well as base of the appendix is noted within the hernia. Proximal and distal transition points are noted at the entry and exit sites of the inguinal hernia. No evidence of small bowel wall thickening or dilatation. Few scattered colonic diverticula. Appendix appears normal. Vascular/Lymphatic: No abdominal aorta or iliac aneurysm. Severe atherosclerotic plaque of the aorta and its branches. No abdominal, pelvic, or inguinal lymphadenopathy. Reproductive: Uterus and bilateral adnexa are unremarkable. Other: Trace volume free fluid within the right inguinal hernia. No intraperitoneal free gas. No organized fluid collection. Musculoskeletal: Moderate volume right inguinal hernia containing a short loop of dilated terminal ileum with an abdominal defect of 2.3 cm. Free fluid is also noted within the hernia. Tiny fat containing umbilical hernia. No suspicious lytic or blastic osseous lesions. No acute displaced fracture. Multilevel degenerative changes of the spine as well as multilevel spondylolisthesis. Intramedullary nail fixation of the left femur. IMPRESSION: 1. Incarcerated right inguinal hernia leading to a cecal closed loop obstruction. The ileocecal valve and base of the  appendix are also located within the hernia. Findings concerning for ischemia. Recommend emergent surgical consultation. 2. Colonic diverticulosis with no acute diverticulitis. 3. Cholelithiasis with no CT evidence of acute cholecystitis. 4. Multiple fluid density pancreatic lesions measuring up to 2.6 cm. 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 Correlation with more recent images would be of value to evaluate stability. Given patient's age, no further follow-up indicated at this time. When the patient is clinically stable and able to follow directions and hold their breath (preferably as an outpatient) further evaluation with dedicated outpatient MRI pancreatic protocol could be considered on a case by case basis. 5. Aortic Atherosclerosis (ICD10-I70.0) including mitral annular calcifications. 6. Sequelae of prior granulomatous disease. These results were called by telephone at the time of interpretation on 03/19/2022 at 8:14 pm to provider [facility] , who verbally acknowledged these results. Electronically Signed   By: Iven Finn M.D.   On: 03/19/2022 20:17    Anti-infectives: Anti-infectives (From admission, onward)    None       Assessment/Plan: Patient with incarcerated right inguinal hernia requiring long time for reduction, over 1 hour. Needs repair.  Full liquids today Hold  [facility] to weigh in Updated patient and daughter and team    LOS: 1 day    Virl Cagey 03/20/2022

## 2022-03-21 DIAGNOSIS — Z01810 Encounter for preprocedural cardiovascular examination: Secondary | ICD-10-CM

## 2022-03-21 DIAGNOSIS — K4030 Unilateral inguinal hernia, with obstruction, without gangrene, not specified as recurrent: Secondary | ICD-10-CM | POA: Diagnosis not present

## 2022-03-21 LAB — MAGNESIUM: Magnesium: 1.9 mg/dL (ref 1.7–2.4)

## 2022-03-21 LAB — BASIC METABOLIC PANEL
Anion gap: 5 (ref 5–15)
BUN: 6 mg/dL — ABNORMAL LOW (ref 8–23)
CO2: 24 mmol/L (ref 22–32)
Calcium: 9.5 mg/dL (ref 8.9–10.3)
Chloride: 108 mmol/L (ref 98–111)
Creatinine, Ser: 0.57 mg/dL (ref 0.44–1.00)
GFR, Estimated: >60 mL/min (ref >60)
Glucose, Bld: 93 mg/dL (ref 70–99)
Potassium: 3.6 mmol/L (ref 3.5–5.1)
Sodium: 137 mmol/L (ref 135–145)

## 2022-03-21 LAB — CBC
HCT: 33 % — ABNORMAL LOW (ref 36.0–46.0)
Hemoglobin: 11 g/dL — ABNORMAL LOW (ref 12.0–15.0)
MCH: 30.2 pg (ref 26.0–34.0)
MCHC: 33.3 g/dL (ref 30.0–36.0)
MCV: 90.7 fL (ref 80.0–100.0)
Platelets: 187 10*3/uL (ref 150–400)
RBC: 3.64 MIL/uL — ABNORMAL LOW (ref 3.87–5.11)
RDW: 14.2 % (ref 11.5–15.5)
WBC: 6 10*3/uL (ref 4.0–10.5)
nRBC: 0 % (ref 0.0–0.2)

## 2022-03-21 LAB — GLUCOSE, CAPILLARY
Glucose-Capillary: 161 mg/dL — ABNORMAL HIGH (ref 70–99)
Glucose-Capillary: 80 mg/dL (ref 70–99)
Glucose-Capillary: 82 mg/dL (ref 70–99)
Glucose-Capillary: 91 mg/dL (ref 70–99)
Glucose-Capillary: 98 mg/dL (ref 70–99)
Glucose-Capillary: 99 mg/dL (ref 70–99)

## 2022-03-21 LAB — PHOSPHORUS: Phosphorus: 2.3 mg/dL — ABNORMAL LOW (ref 2.5–4.6)

## 2022-03-21 LAB — SURGICAL PCR SCREEN
MRSA, PCR: NEGATIVE
Staphylococcus aureus: NEGATIVE

## 2022-03-21 MED ORDER — IRBESARTAN 75 MG PO TABS
35.5000 mg | ORAL_TABLET | Freq: Every day | ORAL | Status: DC
  Administered 2022-03-23 – 2022-03-24 (×2): 37.5 mg via ORAL
  Filled 2022-03-21 (×2): qty 1

## 2022-03-21 MED ORDER — SODIUM CHLORIDE 0.9 % IV SOLN
2.0000 g | INTRAVENOUS | Status: AC
  Administered 2022-03-22: 2 g via INTRAVENOUS
  Filled 2022-03-21 (×2): qty 2

## 2022-03-21 MED ORDER — CHLORHEXIDINE GLUCONATE CLOTH 2 % EX PADS
6.0000 | MEDICATED_PAD | Freq: Once | CUTANEOUS | Status: AC
  Administered 2022-03-21: 6 via TOPICAL

## 2022-03-21 MED ORDER — FUROSEMIDE 10 MG/ML IJ SOLN
20.0000 mg | Freq: Once | INTRAMUSCULAR | Status: AC
  Administered 2022-03-21: 20 mg via INTRAVENOUS
  Filled 2022-03-21: qty 2

## 2022-03-21 MED ORDER — CHLORHEXIDINE GLUCONATE CLOTH 2 % EX PADS
6.0000 | MEDICATED_PAD | Freq: Once | CUTANEOUS | Status: AC
  Administered 2022-03-22: 6 via TOPICAL

## 2022-03-21 NOTE — Progress Notes (Addendum)
Rounding Note    Patient Name: Brittany [name] Date of Encounter: 03/21/2022  [place] Cardiologist: None    Subjective   Feels fine  Inpatient Medications    Scheduled Meds:  insulin aspart  0-9 Units Subcutaneous Q4H   [START ON 03/23/2022] irbesartan  37.5 mg Oral Daily   polyethylene glycol  17 g Oral BID   senna-docusate  2 tablet Oral BID   Continuous Infusions:  PRN Meds: acetaminophen **OR** acetaminophen, HYDROmorphone (DILAUDID) injection, prochlorperazine   Vital Signs    Vitals:   03/20/22 0815 03/20/22 1153 03/20/22 2019 03/21/22 0346  BP: 109/74 (!) 139/57 (!) 142/51 (!) 152/60  Pulse: 74 76 71 72  Resp: '18 18 18 18  '$ Temp: 97.7 F (36.5 C) 97.7 F (36.5 C) 97.7 F (36.5 C) 97.8 F (36.6 C)  TempSrc: Oral Oral Oral Oral  SpO2: 100% 100% 100% 100%  Weight:    68.4 kg  Height:        Intake/Output Summary (Last 24 hours) at 03/21/2022 1032 Last data filed at 03/21/2022 [id number] Gross per 24 hour  Intake 840 ml  Output 851 ml  Net -11 ml      03/21/2022    3:46 AM 03/19/2022   10:10 PM 03/19/2022    5:14 PM  Last 3 Weights  Weight (lbs) 150 lb 12.7 oz 146 lb 9.7 oz 151 lb 10.8 oz  Weight (kg) 68.4 kg 66.5 kg 68.8 kg      Telemetry    NSR - Personally Reviewed  ECG     Physical Exam    GEN: No acute distress.   Neck: No JVD Cardiac: RRR, no murmurs, rubs, or gallops.  Respiratory: Clear to auscultation bilaterally. GI: Soft, nontender, non-distended  MS: No edema; No deformity. Neuro:  Nonfocal  Psych: Normal affect   Labs    High Sensitivity Troponin:  No results for input(s): "TROPONINIHS" in the last 720 hours.   Chemistry Recent Labs  Lab 03/19/22 1745 03/20/22 0435 03/21/22 0900  NA 135 139 137  K 3.1* 4.0 3.6  CL 104 107 108  CO2 '25 27 24  '$ GLUCOSE 137* 84 93  BUN 10 10 6*  CREATININE 0.69 0.62 0.57  CALCIUM 9.8 9.4 9.5  MG  --  2.0 1.9  PROT 7.1 5.9*  --   ALBUMIN 3.7 3.2*  --   AST 22 15  --    ALT 18 14  --   ALKPHOS 64 55  --   BILITOT 0.8 0.8  --   GFRNONAA >60 >60 >60  ANIONGAP '6 5 5    '$ Lipids No results for input(s): "CHOL", "TRIG", "HDL", "LABVLDL", "LDLCALC", "CHOLHDL" in the last 168 hours.  Hematology Recent Labs  Lab 03/19/22 1745 03/20/22 0435 03/21/22 0900  WBC 6.5 6.1 6.0  RBC 4.07 3.61* 3.64*  HGB 12.1 10.8* 11.0*  HCT 36.8 33.4* 33.0*  MCV 90.4 92.5 90.7  MCH 29.7 29.9 30.2  MCHC 32.9 32.3 33.3  RDW 14.2 14.3 14.2  PLT 266 229 187   Thyroid No results for input(s): "TSH", "FREET4" in the last 168 hours.  BNPNo results for input(s): "BNP", "PROBNP" in the last 168 hours.  DDimer No results for input(s): "DDIMER" in the last 168 hours.   Radiology    ECHOCARDIOGRAM COMPLETE  Result Date: 03/20/2022    ECHOCARDIOGRAM REPORT   Patient Name:   Brittany [name] Date of Exam: 03/20/2022 Medical Rec #:  [id number]  Height:       59.0 in Accession #:    [id number]      Weight:       146.6 lb Date of Birth:  11-22-1927       BSA:          1.616 m Patient Age:    89 years        BP:           139/57 mmHg Patient Gender: F               HR:           76 bpm. Exam Location:  [facility] Procedure: 2D Echo, Cardiac Doppler and Color Doppler Indications:    Aortic regurgitation I35.1  History:        Patient has prior history of Echocardiogram examinations, most                 recent 06/15/2020. CHF, CAD, Pacemaker, Arrythmias:Atrial Flutter                 and LBBB; Risk Factors:Hypertension, Diabetes and Dyslipidemia.  Sonographer:    Alvino Chapel RCS Referring Phys: [id number] Franshesca Chipman P Evia Goldsmith IMPRESSIONS  1. Left ventricular ejection fraction, by estimation, is 60 to 65%. The left ventricle has normal function. The left ventricle has no regional wall motion abnormalities. There is mild left ventricular hypertrophy. Left ventricular diastolic function could not be evaluated.  2. Right ventricular systolic function is low normal. The right ventricular size is normal.  There is mildly elevated pulmonary artery systolic pressure. The estimated right ventricular systolic pressure is [id number] mmHg.  3. Left atrial size was severely dilated.  4. The mitral valve is abnormal. Trivial mitral valve regurgitation. Mild mitral stenosis. The mean mitral valve gradient is 3.0 mmHg. Severe mitral annular calcification  5. The aortic valve is tricuspid. Aortic valve regurgitation is mild to moderate. No aortic stenosis is present.  6. Aortic dilatation noted. There is borderline dilatation of the aortic root, measuring 37 mm.  7. The inferior vena cava is dilated in size with >50% respiratory variability, suggesting right atrial pressure of 8 mmHg. Comparison(s): No significant change from prior study. FINDINGS  Left Ventricle: Left ventricular ejection fraction, by estimation, is 60 to 65%. The left ventricle has normal function. The left ventricle has no regional wall motion abnormalities. The left ventricular internal cavity size was normal in size. There is  mild left ventricular hypertrophy. Left ventricular diastolic function could not be evaluated due to paced rhythm. Left ventricular diastolic function could not be evaluated. Right Ventricle: The right ventricular size is normal. No increase in right ventricular wall thickness. Right ventricular systolic function is low normal. There is mildly elevated pulmonary artery systolic pressure. The tricuspid regurgitant velocity is 2.91 m/s, and with an assumed right atrial pressure of 8 mmHg, the estimated right ventricular systolic pressure is [id number] mmHg. Left Atrium: Left atrial size was severely dilated. Right Atrium: Right atrial size was normal in size. Pericardium: There is no evidence of pericardial effusion. Mitral Valve: The mitral valve is abnormal. Severe mitral annular calcification. Trivial mitral valve regurgitation. Mild mitral valve stenosis. MV peak gradient, 11.8 mmHg. The mean mitral valve gradient is 3.0 mmHg. Tricuspid Valve:  The tricuspid valve is abnormal. Tricuspid valve regurgitation is mild . No evidence of tricuspid stenosis. Aortic Valve: The aortic valve is tricuspid. Aortic valve regurgitation is mild to moderate. Aortic regurgitation PHT measures 387 msec. No aortic stenosis is  present. Pulmonic Valve: The pulmonic valve was not well visualized. Pulmonic valve regurgitation is mild. No evidence of pulmonic stenosis. Aorta: Aortic dilatation noted. There is borderline dilatation of the aortic root, measuring 37 mm. Venous: The inferior vena cava is dilated in size with greater than 50% respiratory variability, suggesting right atrial pressure of 8 mmHg. IAS/Shunts: No atrial level shunt detected by color flow Doppler. Additional Comments: A device lead is visualized in the right ventricle and right atrium.  LEFT VENTRICLE PLAX 2D LVIDd:         4.60 cm LVIDs:         3.20 cm LV PW:         1.20 cm LV IVS:        1.20 cm LVOT diam:     2.00 cm LV SV:         64 LV SV Index:   39 LVOT Area:     3.14 cm  RIGHT VENTRICLE TAPSE (M-mode): 1.9 cm LEFT ATRIUM              Index        RIGHT ATRIUM           Index LA diam:        4.65 cm  2.88 cm/m   RA Area:     18.20 cm LA Vol (A2C):   106.0 ml 65.58 ml/m  RA Volume:   48.90 ml  30.25 ml/m LA Vol (A4C):   123.0 ml 76.10 ml/m LA Biplane Vol: 116.0 ml 71.77 ml/m  AORTIC VALVE LVOT Vmax:   89.50 cm/s LVOT Vmean:  58.900 cm/s LVOT VTI:    0.203 m AI PHT:      387 msec  AORTA Ao Root diam: 3.70 cm MITRAL VALVE                TRICUSPID VALVE MV Area (PHT): 3.17 cm     TR Peak grad:   33.9 mmHg MV Area VTI:   1.79 cm     TR Vmax:        291.00 cm/s MV Peak grad:  11.8 mmHg MV Mean grad:  3.0 mmHg     SHUNTS MV Vmax:       1.72 m/s     Systemic VTI:  0.20 m MV Vmean:      70.9 cm/s    Systemic Diam: 2.00 cm MV Decel Time: 239 msec MV E velocity: 147.00 cm/s MV A velocity: 36.70 cm/s MV E/A ratio:  4.01 Calena Salem Priya Buna Cuppett Electronically signed by Lorelee Cover Tabrina Esty Signature  Date/Time: 03/20/2022/3:57:23 PM    Final    CT PELVIS WO CONTRAST  Result Date: 03/19/2022 CLINICAL DATA:  Hernia suspected, inguinal or femoral. Possible reduction. EXAM: CT PELVIS WITHOUT CONTRAST TECHNIQUE: Multidetector CT imaging of the pelvis was performed following the standard protocol without intravenous contrast. RADIATION DOSE REDUCTION: This exam was performed according to the departmental dose-optimization program which includes automated exposure control, adjustment of the mA and/or kV according to patient size and/or use of iterative reconstruction technique. COMPARISON:  CT abdomen and pelvis earlier today at 6:09 p.m. FINDINGS: Urinary Tract: Contrast from prior CT within the bladder. No acute abnormality. Bowel: Decreased distention of the cecum after reduction of the right inguinal hernia. No bowel loops are present in the right inguinal hernia. Mild wall thickening of the cecum has decreased in earlier today. The ileocecal valve and appendix (circa series 3/image 28) are no longer within the hernia. Normal  appearance of the appendix. The visualized small bowel is normal in caliber. Vascular/Lymphatic: Arterial atherosclerotic calcification. No suspicious lymphadenopathy. Reproductive:  No mass or other significant abnormality Other: Decreased size of the right inguinal hernia which contains fat fluid. Small amount of free fluid in the pelvis. Musculoskeletal: ORIF left femur.  No acute fracture. IMPRESSION: 1. Decreased size of the right inguinal hernia which contains fat and fluid. No bowel loops are present in the right inguinal hernia. 2. Decreased distention and wall thickening of the cecum after reduction of the right inguinal hernia. The ileocecal valve and appendix are no longer within the hernia. These results were called by telephone at the time of interpretation on 03/19/2022 at 8:49 pm to provider [facility] , who verbally acknowledged these results. Electronically Signed   By:  Placido Sou M.D.   On: 03/19/2022 20:53   CT Abdomen Pelvis W Contrast  Result Date: 03/19/2022 CLINICAL DATA:  Bowel obstruction suspected EXAM: CT ABDOMEN AND PELVIS WITH CONTRAST TECHNIQUE: Multidetector CT imaging of the abdomen and pelvis was performed using the standard protocol following bolus administration of intravenous contrast. RADIATION DOSE REDUCTION: This exam was performed according to the departmental dose-optimization program which includes automated exposure control, adjustment of the mA and/or kV according to patient size and/or use of iterative reconstruction technique. CONTRAST:  126m OMNIPAQUE IOHEXOL 300 MG/ML  SOLN COMPARISON:  CT angio chest 01/05/2005 report without imaging, CT chest 10/28/2012 report without imaging FINDINGS: Lower chest: Centrally calcified 14 mm left lower lobe pulmonary nodule consistent with described chronic granuloma. Mild bronchial wall thickening. Mitral annular calcifications. Partially visualized cardiac leads. Possible tiny hiatal hernia. Hepatobiliary: No focal liver abnormality. Multiple gallstones noted within the gallbladder lumen. No gallbladder wall thickening or pericholecystic fluid. No biliary dilatation. Pancreas: Multiple fluid density lesions within the pancreas with large its along the pancreatic head measuring 2.6 by 2.1 cm (2:24). Normal pancreatic contour. No surrounding inflammatory changes. No main pancreatic ductal dilatation. Spleen: Diffuse punctate calcifications consistent with sequelae of prior granulomatous disease. Normal in size without focal abnormality. Adrenals/Urinary Tract: No adrenal nodule bilaterally. Bilateral kidneys enhance symmetrically. Subcentimeter hypodensities too small to characterize-no further follow-up indicated. No hydronephrosis. No hydroureter. The urinary bladder is unremarkable. On delayed imaging, there is no urothelial wall thickening and there are no filling defects in the opacified portions of  the bilateral collecting systems or ureters. Stomach/Bowel: Stomach is within normal limits. Dilated loop of cecum measuring up to 6cm with associated surrounding free fluid contained within the right inguinal hernia. The ileocecal valve as well as base of the appendix is noted within the hernia. Proximal and distal transition points are noted at the entry and exit sites of the inguinal hernia. No evidence of small bowel wall thickening or dilatation. Few scattered colonic diverticula. Appendix appears normal. Vascular/Lymphatic: No abdominal aorta or iliac aneurysm. Severe atherosclerotic plaque of the aorta and its branches. No abdominal, pelvic, or inguinal lymphadenopathy. Reproductive: Uterus and bilateral adnexa are unremarkable. Other: Trace volume free fluid within the right inguinal hernia. No intraperitoneal free gas. No organized fluid collection. Musculoskeletal: Moderate volume right inguinal hernia containing a short loop of dilated terminal ileum with an abdominal defect of 2.3 cm. Free fluid is also noted within the hernia. Tiny fat containing umbilical hernia. No suspicious lytic or blastic osseous lesions. No acute displaced fracture. Multilevel degenerative changes of the spine as well as multilevel spondylolisthesis. Intramedullary nail fixation of the left femur. IMPRESSION: 1. Incarcerated right inguinal hernia leading to  a cecal closed loop obstruction. The ileocecal valve and base of the appendix are also located within the hernia. Findings concerning for ischemia. Recommend emergent surgical consultation. 2. Colonic diverticulosis with no acute diverticulitis. 3. Cholelithiasis with no CT evidence of acute cholecystitis. 4. Multiple fluid density pancreatic lesions measuring up to 2.6 cm. 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 Correlation with more recent images would be of value to evaluate stability. Given patient's age, no further follow-up indicated at this time. When the patient is clinically  stable and able to follow directions and hold their breath (preferably as an outpatient) further evaluation with dedicated outpatient MRI pancreatic protocol could be considered on a case by case basis. 5. Aortic Atherosclerosis (ICD10-I70.0) including mitral annular calcifications. 6. Sequelae of prior granulomatous disease. These results were called by telephone at the time of interpretation on 03/19/2022 at 8:14 pm to provider [facility] , who verbally acknowledged these results. Electronically Signed   By: Iven Finn M.D.   On: 03/19/2022 20:17    Cardiac Studies   Echo 03/20/22 IMPRESSIONS     1. Left ventricular ejection fraction, by estimation, is 60 to 65%. The  left ventricle has normal function. The left ventricle has no regional  wall motion abnormalities. There is mild left ventricular hypertrophy.  Left ventricular diastolic function  could not be evaluated.   2. Right ventricular systolic function is low normal. The right  ventricular size is normal. There is mildly elevated pulmonary artery  systolic pressure. The estimated right ventricular systolic pressure is  [id number] mmHg.   3. Left atrial size was severely dilated.   4. The mitral valve is abnormal. Trivial mitral valve regurgitation. Mild  mitral stenosis. The mean mitral valve gradient is 3.0 mmHg. Severe mitral  annular calcification   5. The aortic valve is tricuspid. Aortic valve regurgitation is mild to  moderate. No aortic stenosis is present.   6. Aortic dilatation noted. There is borderline dilatation of the aortic  root, measuring 37 mm.   7. The inferior vena cava is dilated in size with >50% respiratory  variability, suggesting right atrial pressure of 8 mmHg.   Comparison(s): No significant change from prior study.     Echo 06/15/20 IMPRESSIONS     1. Left ventricular ejection fraction, by estimation, is 50 to 55%. The  left ventricle has low normal function. The left ventricle has no regional   wall motion abnormalities. There is moderate concentric left ventricular  hypertrophy. Left ventricular  diastolic parameters are indeterminate.   2. Right ventricular systolic function is normal. The right ventricular  size is normal. There is severely elevated pulmonary artery systolic  pressure. The estimated right ventricular systolic pressure is [id number] mmHg.   3. Left atrial size was mild to moderately dilated.   4. The mitral valve is abnormal. Trivial mitral valve regurgitation. The  mean mitral valve gradient is 4.5 mmHg with average heart rate of 71 bpm.  Moderate to severe mitral annular calcification.   5. The aortic valve is tricuspid. There is mild calcification of the  aortic valve. There is mild thickening of the aortic valve. Aortic valve  regurgitation is mild to moderate. Mild aortic valve sclerosis is present,  with no evidence of aortic valve  stenosis.   6. The inferior vena cava is normal in size with <50% respiratory  variability, suggesting right atrial pressure of 8 mmHg.   Comparison(s): A prior study was performed on 01/03/2015. Prior images  reviewed side by side. Slight increase  in tricuspid regurgitation and  aortic regurgitation; slight decrease in LVEF.     Patient Profile     89 y.o. female with history of  CAD s/p LAD angioplasty with normal LVEF, CHB s/p [employer] PPM and generator replacement in 2017, atrial flutter status post DCCV, currently on [facility] is here admitted with incarcerated inguinal hernia which was reduced by general surgery yesterday, 03/19/2022.  Assessment & Plan    Preop cardiac risk stratification for incarcerated right inguinal hernia under general anesthesia -Patient had 1 episode of chest pain radiating to left arm approximately 4 to 5 months ago and had no recurrence since then. No DOE. Echocardiogram from 03/2022 showed LVEF 60% with mild to moderate AI. No evidence of aortic valve stenosis. Mildly elevated RVSP-give one  dose IV lasix 20 mg. Due to her advanced age, she will be at an elevated risk for any perioperative cardiac complications. No further cardiac testing like stress test is indicated prior to proceeding with the planned procedure. -Hold Eliquis 2 to 3 days prior to the surgery -Patient has a Chemical engineer PPM in place, device is approaching ERI.  As the surgery is below the umbilical line, need not apply the magnet.  However if there is any increased risk of electromagnetic interference from the procedure, it is safer to apply the magnet for asynchronous pacing. Need to call the [facility] scientific rep pre and post surgery (if magnet is applied). His contact info, [place], [phone].   # CAD with prior LAD POBA-not on aspirin due to Eliquis use, 1 sporadic episode of chest pain, no further testing is indicated.   # Atrial flutter/tachcardia on reduced carvedilol b/c of low BP-eliquis on hold   # Permanent pacemaker for CHB approaching ERI-4.3 months   # Chronic diastolic CHF, LVEF [id number] compensated   # Mild to moderate AI: Update Echocardiogram     [facility] will sign off.   Medication Recommendations:   lasix 20 mg IV x1 Other recommendations (labs, testing, etc):  none Follow up as an outpatient:  as scheduled  For questions or updates, please contact [place] Please consult [email or web address] for contact info under        Signed, Ermalinda Barrios, PA-C  03/21/2022, 10:32 AM     Patient seen and independently examined with Ermalinda Barrios, PA-C. We discussed all aspects of the encounter. I agree with the assessment and plan as stated above.  No acute events overnight. Echocardiogram this admission showed normal LVEF, mild to moderate AI and mild mitral valve stenosis. No further evaluation needs to be performed based on echocardiogram. Patient is at an elevated risk for any perioperative cardiac complications given her advanced age. No further testing is indicated  prior to proceeding with the planned procedure.  Patient has a dual-chamber pacemaker (pacemaker dependent from CHB) for which magnet does not have to be applied for surgery as below the medical line, however if there is increased risk of electromagnetic interference from the cautery, need to apply magnet. If magnet is applied, [employer] rep will need to be called in to reprogram the device pre and postsurgery. Joey Deakins, [phone].  Otherwise, continue to hold Eliquis for the surgery and resume when safe from bleeding standpoint. For mild pulmonary HTN, IV Lasix 20 mg one-time dose was administered.  Mild to moderate AI will be followed up as outpatient. Cardiology will sign off.  I have spent a total of 33 minutes with patient reviewing chart , telemetry, EKGs,  labs and examining patient as well as establishing an assessment and plan that was discussed with the patient.  > 50% of time was spent in direct patient care.    Vonte Rossin Fidel Levy, MD [place]  12:45 PM

## 2022-03-21 NOTE — Care Management Important Message (Signed)
Important Message  Patient Details  Name: Brittany [name] MRN: [id number] Date of Birth: 1927-04-07   Medicare Important Message Given:  N/A - LOS <3 / Initial given by admissions     Tommy Medal 03/21/2022, 9:51 AM

## 2022-03-21 NOTE — Progress Notes (Addendum)
PROGRESS NOTE    Brittany [name]  [id number] DOB: 06-Feb-1927 DOA: 03/19/2022 PCP: Bernerd Limbo, MD  Chief Complaint  Patient presents with   Hernia    Brief Narrative:   ANTRONETTE SUGUITAN is Pearley Millington 89 y.o. female with medical history significant of hypertension, hyperlipidemia, history of atrial flutter, complete heart block status post pacemaker implantation, type 2 diabetes mellitus, chronic diastolic heart failure who presented with an incarcerated right inguinal hernia.  Now s/p reduction by general surgery.  Plan for surgery 3/13.  See below for additional details  Assessment & Plan:   Principal Problem:   Incarcerated right inguinal hernia Active Problems:   Essential hypertension   PACEMAKER, PERMANENT   Chronic diastolic CHF (congestive heart failure) (HCC)   Atrial flutter, chronic (HCC)   Hypokalemia   Lactic acidosis   LBBB (left bundle branch block)   Type 2 diabetes mellitus with hyperglycemia (HCC)   Constipation   Obesity (BMI 30-39.9)   Prolonged QT interval  Incarcerated right inguinal hernia Required over 1 hour to reduce Plan for repair by general surgery -> scheduled for 3/13 Appreciate cards risk stratification (note 3/11) - increased risk for any perioperative cardiac complications --- echo as below, hold eliquis 2-3 days preop, no need to place magnet as surgery below umbilical line, but if increased risk of EM interference, can call rep to application of magnet   Hypokalemia Improved, trend   Prolonged QT interval Paced rhythm Avoid QT prolonging drugs Mag 2   Lactic acidosis-resolved   CAD  On eliquis for flutter  Atrial flutter Continue telemetry Eliquis on hold with plan for surgery   Type 2 diabetes mellitus Hemoglobin A1c on 02/28/2022 was 5.9 Metformin will be held at this time Continue ISS and hypoglycemia protocol   Chronic diastolic congestive heart failure Stable Continue total input/output, daily weights and fluid  restriction Continue heart healthy/modified carb diet         LBBB S/p pacemaker implantation Last reviewed remotely on 03/06/2022 with report for battery approaching elective replacement indicator Continue telemetry   Essential hypertension Continue irbesartan (hold prior to surgery, ordered to resume Thursday)   Constipation Continue senna - docusate  Pancreatic lesions CT abdomen pelvis with contrast showed multiple fluid density pancreatic lesions measuring up to 2.6 cm. 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 No further follow-up indicated at this time per radiologist's recommendation  When the patient is clinically stable and able to follow directions and hold their breath (preferably as an outpatient) further evaluation with dedicated outpatient MRI pancreatic protocol could be considered on Savonna Birchmeier case by case basis per radiologist's recommendation    DVT prophylaxis: SCD Code Status: DNR/DNI Family Communication: none - called Amie Critchley, no answer Disposition:   Status is: Inpatient Remains inpatient appropriate because: continued surgical eval   Consultants:  Cardiology Surgery `  Procedures:  Echo IMPRESSIONS     1. Left ventricular ejection fraction, by estimation, is 60 to 65%. The  left ventricle has normal function. The left ventricle has no regional  wall motion abnormalities. There is mild left ventricular hypertrophy.  Left ventricular diastolic function  could not be evaluated.   2. Right ventricular systolic function is low normal. The right  ventricular size is normal. There is mildly elevated pulmonary artery  systolic pressure. The estimated right ventricular systolic pressure is  99991111 mmHg.   3. Left atrial size was severely dilated.   4. The mitral valve is abnormal. Trivial mitral valve regurgitation. Mild  mitral stenosis. The  mean mitral valve gradient is 3.0 mmHg. Severe mitral  annular calcification   5. The aortic valve is tricuspid. Aortic valve  regurgitation is mild to  moderate. No aortic stenosis is present.   6. Aortic dilatation noted. There is borderline dilatation of the aortic  root, measuring 37 mm.   7. The inferior vena cava is dilated in size with >50% respiratory  variability, suggesting right atrial pressure of 8 mmHg.   Comparison(s): No significant change from prior study.    Antimicrobials:  Anti-infectives (From admission, onward)    None       Subjective: No complaints Says her daughter made the toboggan   Objective: Vitals:   03/20/22 0815 03/20/22 1153 03/20/22 2019 03/21/22 0346  BP: 109/74 (!) 139/57 (!) 142/51 (!) 152/60  Pulse: 74 76 71 72  Resp: '18 18 18 18  '$ Temp: 97.7 F (36.5 C) 97.7 F (36.5 C) 97.7 F (36.5 C) 97.8 F (36.6 C)  TempSrc: Oral Oral Oral Oral  SpO2: 100% 100% 100% 100%  Weight:    68.4 kg  Height:        Intake/Output Summary (Last 24 hours) at 03/21/2022 0941 Last data filed at 03/21/2022 [id number] Gross per 24 hour  Intake 840 ml  Output 851 ml  Net -11 ml   Filed Weights   03/19/22 1714 03/19/22 2210 03/21/22 0346  Weight: 68.8 kg 66.5 kg 68.4 kg    Examination:  General: No acute distress.  Pleasant, wearing pink knitted toboggan with ears Cardiovascular: RRR Lungs: unlabored Abdomen: Soft, nontender, nondistended Neurological: Alert and oriented 3. Moves all extremities 4 with equal strength. Cranial nerves II through XII grossly intact. Extremities: No clubbing or cyanosis. No edema.  Data Reviewed: I have personally reviewed following labs and imaging studies  CBC: Recent Labs  Lab 03/19/22 1745 03/20/22 0435 03/21/22 0900  WBC 6.5 6.1 6.0  NEUTROABS 3.9  --   --   HGB 12.1 10.8* 11.0*  HCT 36.8 33.4* 33.0*  MCV 90.4 92.5 90.7  PLT 266 229 [id number]    Basic Metabolic Panel: Recent Labs  Lab 03/19/22 1745 03/20/22 0435 03/21/22 0900  NA 135 139 137  K 3.1* 4.0 3.6  CL 104 107 108  CO2 '25 27 24  '$ GLUCOSE 137* 84 93  BUN 10 10 6*   CREATININE 0.69 0.62 0.57  CALCIUM 9.8 9.4 9.5  MG  --  2.0 1.9  PHOS  --  2.8 2.3*    GFR: Estimated Creatinine Clearance: 37 mL/min (by C-G formula based on SCr of 0.57 mg/dL).  Liver Function Tests: Recent Labs  Lab 03/19/22 1745 03/20/22 0435  AST 22 15  ALT 18 14  ALKPHOS 64 55  BILITOT 0.8 0.8  PROT 7.1 5.9*  ALBUMIN 3.7 3.2*    CBG: Recent Labs  Lab 03/20/22 1602 03/20/22 2021 03/20/22 2342 03/21/22 0349 03/21/22 0718  GLUCAP 117* 132* 86 91 82     No results found for this or any previous visit (from the past 240 hour(s)).       Radiology Studies: ECHOCARDIOGRAM COMPLETE  Result Date: 03/20/2022    ECHOCARDIOGRAM REPORT   Patient Name:   JO-ANN JAGGARS Geier Date of Exam: 03/20/2022 Medical Rec #:  [id number]       Height:       59.0 in Accession #:    [id number]      Weight:       146.6 lb Date of Birth:  26-Aug-1927  BSA:          1.616 m Patient Age:    7 years        BP:           139/57 mmHg Patient Gender: F               HR:           76 bpm. Exam Location:  [facility] Procedure: 2D Echo, Cardiac Doppler and Color Doppler Indications:    Aortic regurgitation I35.1  History:        Patient has prior history of Echocardiogram examinations, most                 recent 06/15/2020. CHF, CAD, Pacemaker, Arrythmias:Atrial Flutter                 and LBBB; Risk Factors:Hypertension, Diabetes and Dyslipidemia.  Sonographer:    Alvino Chapel RCS Referring Phys: [id number] VISHNU P MALLIPEDDI IMPRESSIONS  1. Left ventricular ejection fraction, by estimation, is 60 to 65%. The left ventricle has normal function. The left ventricle has no regional wall motion abnormalities. There is mild left ventricular hypertrophy. Left ventricular diastolic function could not be evaluated.  2. Right ventricular systolic function is low normal. The right ventricular size is normal. There is mildly elevated pulmonary artery systolic pressure. The estimated right ventricular systolic pressure  is [id number] mmHg.  3. Left atrial size was severely dilated.  4. The mitral valve is abnormal. Trivial mitral valve regurgitation. Mild mitral stenosis. The mean mitral valve gradient is 3.0 mmHg. Severe mitral annular calcification  5. The aortic valve is tricuspid. Aortic valve regurgitation is mild to moderate. No aortic stenosis is present.  6. Aortic dilatation noted. There is borderline dilatation of the aortic root, measuring 37 mm.  7. The inferior vena cava is dilated in size with >50% respiratory variability, suggesting right atrial pressure of 8 mmHg. Comparison(s): No significant change from prior study. FINDINGS  Left Ventricle: Left ventricular ejection fraction, by estimation, is 60 to 65%. The left ventricle has normal function. The left ventricle has no regional wall motion abnormalities. The left ventricular internal cavity size was normal in size. There is  mild left ventricular hypertrophy. Left ventricular diastolic function could not be evaluated due to paced rhythm. Left ventricular diastolic function could not be evaluated. Right Ventricle: The right ventricular size is normal. No increase in right ventricular wall thickness. Right ventricular systolic function is low normal. There is mildly elevated pulmonary artery systolic pressure. The tricuspid regurgitant velocity is 2.91 m/s, and with an assumed right atrial pressure of 8 mmHg, the estimated right ventricular systolic pressure is 99991111 mmHg. Left Atrium: Left atrial size was severely dilated. Right Atrium: Right atrial size was normal in size. Pericardium: There is no evidence of pericardial effusion. Mitral Valve: The mitral valve is abnormal. Severe mitral annular calcification. Trivial mitral valve regurgitation. Mild mitral valve stenosis. MV peak gradient, 11.8 mmHg. The mean mitral valve gradient is 3.0 mmHg. Tricuspid Valve: The tricuspid valve is abnormal. Tricuspid valve regurgitation is mild . No evidence of tricuspid stenosis.  Aortic Valve: The aortic valve is tricuspid. Aortic valve regurgitation is mild to moderate. Aortic regurgitation PHT measures 387 msec. No aortic stenosis is present. Pulmonic Valve: The pulmonic valve was not well visualized. Pulmonic valve regurgitation is mild. No evidence of pulmonic stenosis. Aorta: Aortic dilatation noted. There is borderline dilatation of the aortic root, measuring 37 mm. Venous: The inferior vena cava  is dilated in size with greater than 50% respiratory variability, suggesting right atrial pressure of 8 mmHg. IAS/Shunts: No atrial level shunt detected by color flow Doppler. Additional Comments: Arzell Mcgeehan device lead is visualized in the right ventricle and right atrium.  LEFT VENTRICLE PLAX 2D LVIDd:         4.60 cm LVIDs:         3.20 cm LV PW:         1.20 cm LV IVS:        1.20 cm LVOT diam:     2.00 cm LV SV:         64 LV SV Index:   39 LVOT Area:     3.14 cm  RIGHT VENTRICLE TAPSE (M-mode): 1.9 cm LEFT ATRIUM              Index        RIGHT ATRIUM           Index LA diam:        4.65 cm  2.88 cm/m   RA Area:     18.20 cm LA Vol (A2C):   106.0 ml 65.58 ml/m  RA Volume:   48.90 ml  30.25 ml/m LA Vol (A4C):   123.0 ml 76.10 ml/m LA Biplane Vol: 116.0 ml 71.77 ml/m  AORTIC VALVE LVOT Vmax:   89.50 cm/s LVOT Vmean:  58.900 cm/s LVOT VTI:    0.203 m AI PHT:      387 msec  AORTA Ao Root diam: 3.70 cm MITRAL VALVE                TRICUSPID VALVE MV Area (PHT): 3.17 cm     TR Peak grad:   33.9 mmHg MV Area VTI:   1.79 cm     TR Vmax:        291.00 cm/s MV Peak grad:  11.8 mmHg MV Mean grad:  3.0 mmHg     SHUNTS MV Vmax:       1.72 m/s     Systemic VTI:  0.20 m MV Vmean:      70.9 cm/s    Systemic Diam: 2.00 cm MV Decel Time: 239 msec MV E velocity: 147.00 cm/s MV Chaia Ikard velocity: 36.70 cm/s MV E/Terrilyn Tyner ratio:  4.01 Vishnu Priya Mallipeddi Electronically signed by Lorelee Cover Mallipeddi Signature Date/Time: 03/20/2022/3:57:23 PM    Final    CT PELVIS WO CONTRAST  Result Date: 03/19/2022 CLINICAL  DATA:  Hernia suspected, inguinal or femoral. Possible reduction. EXAM: CT PELVIS WITHOUT CONTRAST TECHNIQUE: Multidetector CT imaging of the pelvis was performed following the standard protocol without intravenous contrast. RADIATION DOSE REDUCTION: This exam was performed according to the departmental dose-optimization program which includes automated exposure control, adjustment of the mA and/or kV according to patient size and/or use of iterative reconstruction technique. COMPARISON:  CT abdomen and pelvis earlier today at 6:09 p.m. FINDINGS: Urinary Tract: Contrast from prior CT within the bladder. No acute abnormality. Bowel: Decreased distention of the cecum after reduction of the right inguinal hernia. No bowel loops are present in the right inguinal hernia. Mild wall thickening of the cecum has decreased in earlier today. The ileocecal valve and appendix (circa series 3/image 28) are no longer within the hernia. Normal appearance of the appendix. The visualized small bowel is normal in caliber. Vascular/Lymphatic: Arterial atherosclerotic calcification. No suspicious lymphadenopathy. Reproductive:  No mass or other significant abnormality Other: Decreased size of the right inguinal hernia which contains fat fluid. Small  amount of free fluid in the pelvis. Musculoskeletal: ORIF left femur.  No acute fracture. IMPRESSION: 1. Decreased size of the right inguinal hernia which contains fat and fluid. No bowel loops are present in the right inguinal hernia. 2. Decreased distention and wall thickening of the cecum after reduction of the right inguinal hernia. The ileocecal valve and appendix are no longer within the hernia. These results were called by telephone at the time of interpretation on 03/19/2022 at 8:49 pm to provider [facility] , who verbally acknowledged these results. Electronically Signed   By: Placido Sou M.D.   On: 03/19/2022 20:53   CT Abdomen Pelvis W Contrast  Result Date:  03/19/2022 CLINICAL DATA:  Bowel obstruction suspected EXAM: CT ABDOMEN AND PELVIS WITH CONTRAST TECHNIQUE: Multidetector CT imaging of the abdomen and pelvis was performed using the standard protocol following bolus administration of intravenous contrast. RADIATION DOSE REDUCTION: This exam was performed according to the departmental dose-optimization program which includes automated exposure control, adjustment of the mA and/or kV according to patient size and/or use of iterative reconstruction technique. CONTRAST:  179m OMNIPAQUE IOHEXOL 300 MG/ML  SOLN COMPARISON:  CT angio chest 01/05/2005 report without imaging, CT chest 10/28/2012 report without imaging FINDINGS: Lower chest: Centrally calcified 14 mm left lower lobe pulmonary nodule consistent with described chronic granuloma. Mild bronchial wall thickening. Mitral annular calcifications. Partially visualized cardiac leads. Possible tiny hiatal hernia. Hepatobiliary: No focal liver abnormality. Multiple gallstones noted within the gallbladder lumen. No gallbladder wall thickening or pericholecystic fluid. No biliary dilatation. Pancreas: Multiple fluid density lesions within the pancreas with large its along the pancreatic head measuring 2.6 by 2.1 cm (2:24). Normal pancreatic contour. No surrounding inflammatory changes. No main pancreatic ductal dilatation. Spleen: Diffuse punctate calcifications consistent with sequelae of prior granulomatous disease. Normal in size without focal abnormality. Adrenals/Urinary Tract: No adrenal nodule bilaterally. Bilateral kidneys enhance symmetrically. Subcentimeter hypodensities too small to characterize-no further follow-up indicated. No hydronephrosis. No hydroureter. The urinary bladder is unremarkable. On delayed imaging, there is no urothelial wall thickening and there are no filling defects in the opacified portions of the bilateral collecting systems or ureters. Stomach/Bowel: Stomach is within normal limits.  Dilated loop of cecum measuring up to 6cm with associated surrounding free fluid contained within the right inguinal hernia. The ileocecal valve as well as base of the appendix is noted within the hernia. Proximal and distal transition points are noted at the entry and exit sites of the inguinal hernia. No evidence of small bowel wall thickening or dilatation. Few scattered colonic diverticula. Appendix appears normal. Vascular/Lymphatic: No abdominal aorta or iliac aneurysm. Severe atherosclerotic plaque of the aorta and its branches. No abdominal, pelvic, or inguinal lymphadenopathy. Reproductive: Uterus and bilateral adnexa are unremarkable. Other: Trace volume free fluid within the right inguinal hernia. No intraperitoneal free gas. No organized fluid collection. Musculoskeletal: Moderate volume right inguinal hernia containing Zaidy Absher short loop of dilated terminal ileum with an abdominal defect of 2.3 cm. Free fluid is also noted within the hernia. Tiny fat containing umbilical hernia. No suspicious lytic or blastic osseous lesions. No acute displaced fracture. Multilevel degenerative changes of the spine as well as multilevel spondylolisthesis. Intramedullary nail fixation of the left femur. IMPRESSION: 1. Incarcerated right inguinal hernia leading to Euva Rundell cecal closed loop obstruction. The ileocecal valve and base of the appendix are also located within the hernia. Findings concerning for ischemia. Recommend emergent surgical consultation. 2. Colonic diverticulosis with no acute diverticulitis. 3. Cholelithiasis with no CT evidence  of acute cholecystitis. 4. Multiple fluid density pancreatic lesions measuring up to 2.6 cm. 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 Correlation with more recent images would be of value to evaluate stability. Given patient's age, no further follow-up indicated at this time. When the patient is clinically stable and able to follow directions and hold their breath (preferably as an outpatient)  further evaluation with dedicated outpatient MRI pancreatic protocol could be considered on Sarenity Ramaker case by case basis. 5. Aortic Atherosclerosis (ICD10-I70.0) including mitral annular calcifications. 6. Sequelae of prior granulomatous disease. These results were called by telephone at the time of interpretation on 03/19/2022 at 8:14 pm to provider [facility] , who verbally acknowledged these results. Electronically Signed   By: Iven Finn M.D.   On: 03/19/2022 20:17        Scheduled Meds:  insulin aspart  0-9 Units Subcutaneous Q4H   irbesartan  37.5 mg Oral Daily   polyethylene glycol  17 g Oral BID   senna-docusate  2 tablet Oral BID   Continuous Infusions:   LOS: 2 days    Time spent: over 30 min    Fayrene Helper, MD [facility]   To contact the attending provider between 7A-7P or the covering provider during after hours 7P-7A, please log into the web site [email or web address] and access using universal [place] password for that web site. If you do not have the password, please call the hospital operator.  03/21/2022, 9:41 AM

## 2022-03-21 NOTE — Progress Notes (Signed)
[facility]  Doing well. Eating. No recurrence of incarceration.   Cardiology says high risk due to age, no further work up needed, ECHO reassuring.  BP 139/60 (BP Location: Left Arm)   Pulse 71   Temp 97.9 F (36.6 C) (Oral)   Resp 18   Ht '4\' 11"'$  (1.499 m)   Wt 68.4 kg   SpO2 100%   BMI 30.46 kg/m  Soft, nondistended, nontender  Patient with R inguinal hernia that was incarcerated. Will need repair or has risk of incarceration again and I took over 1 hour to reduce this hernia.  Discussed with daughter risk of bleeding, infection, recurrence, use of mesh and issues with anesthesia/ cardiac issues post op or intraoperative due to her age.  At this time the benefits of trying to repair non emergently off blood thinners is best option. Daughter in agreement.   Curlene Labrum, MD [facility] [address] [place],  [id number] [phone] (office)

## 2022-03-22 ENCOUNTER — Encounter (HOSPITAL_COMMUNITY): Payer: Self-pay | Admitting: Internal Medicine

## 2022-03-22 ENCOUNTER — Encounter (HOSPITAL_COMMUNITY)
Admission: EM | Disposition: A | Discharge status: Skilled Nursing Facility | Payer: Self-pay | Source: Home / Self Care | Attending: Internal Medicine

## 2022-03-22 ENCOUNTER — Other Ambulatory Visit: Payer: Self-pay

## 2022-03-22 ENCOUNTER — Inpatient Hospital Stay (HOSPITAL_COMMUNITY): Payer: Medicare Other | Admitting: Certified Registered Nurse Anesthetist

## 2022-03-22 DIAGNOSIS — K4090 Unilateral inguinal hernia, without obstruction or gangrene, not specified as recurrent: Secondary | ICD-10-CM

## 2022-03-22 DIAGNOSIS — K4030 Unilateral inguinal hernia, with obstruction, without gangrene, not specified as recurrent: Secondary | ICD-10-CM | POA: Diagnosis not present

## 2022-03-22 DIAGNOSIS — I110 Hypertensive heart disease with heart failure: Secondary | ICD-10-CM

## 2022-03-22 DIAGNOSIS — Z87891 Personal history of nicotine dependence: Secondary | ICD-10-CM

## 2022-03-22 DIAGNOSIS — I2510 Atherosclerotic heart disease of native coronary artery without angina pectoris: Secondary | ICD-10-CM

## 2022-03-22 DIAGNOSIS — I509 Heart failure, unspecified: Secondary | ICD-10-CM

## 2022-03-22 HISTORY — PX: INGUINAL HERNIA REPAIR: SHX194

## 2022-03-22 LAB — CBC WITH DIFFERENTIAL/PLATELET
Abs Immature Granulocytes: 0.02 10*3/uL (ref 0.00–0.07)
Basophils Absolute: 0 10*3/uL (ref 0.0–0.1)
Basophils Relative: 0 %
Eosinophils Absolute: 0.1 10*3/uL (ref 0.0–0.5)
Eosinophils Relative: 1 %
HCT: 34.1 % — ABNORMAL LOW (ref 36.0–46.0)
Hemoglobin: 11.3 g/dL — ABNORMAL LOW (ref 12.0–15.0)
Immature Granulocytes: 0 %
Lymphocytes Relative: 16 %
Lymphs Abs: 1.3 10*3/uL (ref 0.7–4.0)
MCH: 30.2 pg (ref 26.0–34.0)
MCHC: 33.1 g/dL (ref 30.0–36.0)
MCV: 91.2 fL (ref 80.0–100.0)
Monocytes Absolute: 0.9 10*3/uL (ref 0.1–1.0)
Monocytes Relative: 12 %
Neutro Abs: 5.6 10*3/uL (ref 1.7–7.7)
Neutrophils Relative %: 71 %
Platelets: 168 10*3/uL (ref 150–400)
RBC: 3.74 MIL/uL — ABNORMAL LOW (ref 3.87–5.11)
RDW: 14 % (ref 11.5–15.5)
WBC: 8 10*3/uL (ref 4.0–10.5)
nRBC: 0 % (ref 0.0–0.2)

## 2022-03-22 LAB — GLUCOSE, CAPILLARY
Glucose-Capillary: 107 mg/dL — ABNORMAL HIGH (ref 70–99)
Glucose-Capillary: 103 mg/dL — ABNORMAL HIGH (ref 70–99)
Glucose-Capillary: 100 mg/dL — ABNORMAL HIGH (ref 70–99)
Glucose-Capillary: 107 mg/dL — ABNORMAL HIGH (ref 70–99)
Glucose-Capillary: 116 mg/dL — ABNORMAL HIGH (ref 70–99)
Glucose-Capillary: 128 mg/dL — ABNORMAL HIGH (ref 70–99)
Glucose-Capillary: 130 mg/dL — ABNORMAL HIGH (ref 70–99)
Glucose-Capillary: 134 mg/dL — ABNORMAL HIGH (ref 70–99)

## 2022-03-22 LAB — COMPREHENSIVE METABOLIC PANEL
ALT: 11 U/L (ref 0–44)
AST: 15 U/L (ref 15–41)
Albumin: 3 g/dL — ABNORMAL LOW (ref 3.5–5.0)
Alkaline Phosphatase: 54 U/L (ref 38–126)
Anion gap: 8 (ref 5–15)
BUN: 7 mg/dL — ABNORMAL LOW (ref 8–23)
CO2: 24 mmol/L (ref 22–32)
Calcium: 9.7 mg/dL (ref 8.9–10.3)
Chloride: 106 mmol/L (ref 98–111)
Creatinine, Ser: 0.68 mg/dL (ref 0.44–1.00)
GFR, Estimated: >60 mL/min (ref >60)
Glucose, Bld: 104 mg/dL — ABNORMAL HIGH (ref 70–99)
Potassium: 4 mmol/L (ref 3.5–5.1)
Sodium: 138 mmol/L (ref 135–145)
Total Bilirubin: 1 mg/dL (ref 0.3–1.2)
Total Protein: 6 g/dL — ABNORMAL LOW (ref 6.5–8.1)

## 2022-03-22 LAB — PHOSPHORUS: Phosphorus: 2.1 mg/dL — ABNORMAL LOW (ref 2.5–4.6)

## 2022-03-22 LAB — MAGNESIUM: Magnesium: 1.9 mg/dL (ref 1.7–2.4)

## 2022-03-22 SURGERY — REPAIR, HERNIA, INGUINAL, ADULT
Anesthesia: General | Site: Groin | Laterality: Right

## 2022-03-22 MED ORDER — ROCURONIUM BROMIDE 10 MG/ML (PF) SYRINGE
PREFILLED_SYRINGE | INTRAVENOUS | Status: DC | PRN
  Administered 2022-03-22: 50 mg via INTRAVENOUS

## 2022-03-22 MED ORDER — ONDANSETRON HCL 4 MG/2ML IJ SOLN
INTRAMUSCULAR | Status: AC
  Filled 2022-03-22: qty 2

## 2022-03-22 MED ORDER — ACETAMINOPHEN 10 MG/ML IV SOLN
INTRAVENOUS | Status: DC | PRN
  Administered 2022-03-22: 1000 mg via INTRAVENOUS

## 2022-03-22 MED ORDER — PNEUMOCOCCAL 20-VAL CONJ VACC 0.5 ML IM SUSY
0.5000 mL | PREFILLED_SYRINGE | INTRAMUSCULAR | Status: AC
  Administered 2022-03-23: 0.5 mL via INTRAMUSCULAR
  Filled 2022-03-22: qty 0.5

## 2022-03-22 MED ORDER — ACETAMINOPHEN 10 MG/ML IV SOLN
INTRAVENOUS | Status: AC
  Filled 2022-03-22: qty 100

## 2022-03-22 MED ORDER — LIDOCAINE HCL (PF) 2 % IJ SOLN
INTRAMUSCULAR | Status: AC
  Filled 2022-03-22: qty 5

## 2022-03-22 MED ORDER — BUPIVACAINE HCL (300 MG DOSE) 3 X 100 MG IL IMPL
DRUG_IMPLANT | Status: AC
  Filled 2022-03-22: qty 100

## 2022-03-22 MED ORDER — BUPIVACAINE HCL (300 MG DOSE) 3 X 100 MG IL IMPL
DRUG_IMPLANT | Status: DC | PRN
  Administered 2022-03-22: 300 mg

## 2022-03-22 MED ORDER — PROPOFOL 10 MG/ML IV BOLUS
INTRAVENOUS | Status: AC
  Filled 2022-03-22: qty 20

## 2022-03-22 MED ORDER — SODIUM CHLORIDE 0.9 % IR SOLN
Status: DC | PRN
  Administered 2022-03-22: 1000 mL

## 2022-03-22 MED ORDER — ROCURONIUM BROMIDE 10 MG/ML (PF) SYRINGE
PREFILLED_SYRINGE | INTRAVENOUS | Status: AC
  Filled 2022-03-22: qty 10

## 2022-03-22 MED ORDER — FUROSEMIDE 40 MG PO TABS
40.0000 mg | ORAL_TABLET | Freq: Every day | ORAL | Status: DC
  Administered 2022-03-23 – 2022-03-24 (×2): 40 mg via ORAL
  Filled 2022-03-22 (×2): qty 1

## 2022-03-22 MED ORDER — ONDANSETRON HCL 4 MG/2ML IJ SOLN
INTRAMUSCULAR | Status: DC | PRN
  Administered 2022-03-22: 4 mg via INTRAVENOUS

## 2022-03-22 MED ORDER — LIDOCAINE 2% (20 MG/ML) 5 ML SYRINGE
INTRAMUSCULAR | Status: DC | PRN
  Administered 2022-03-22: 50 mg via INTRAVENOUS

## 2022-03-22 MED ORDER — LACTATED RINGERS IV SOLN: INTRAVENOUS | Status: DC | PRN

## 2022-03-22 MED ORDER — OXYCODONE HCL 5 MG PO TABS
5.0000 mg | ORAL_TABLET | ORAL | Status: DC | PRN
  Administered 2022-03-24: 5 mg via ORAL
  Filled 2022-03-22: qty 1

## 2022-03-22 MED ORDER — KETOROLAC TROMETHAMINE 30 MG/ML IJ SOLN
INTRAMUSCULAR | Status: AC
  Filled 2022-03-22: qty 1

## 2022-03-22 MED ORDER — MORPHINE SULFATE (PF) 2 MG/ML IV SOLN: 2.0000 mg | INTRAVENOUS | Status: DC | PRN

## 2022-03-22 MED ORDER — PROPOFOL 10 MG/ML IV BOLUS
INTRAVENOUS | Status: DC | PRN
  Administered 2022-03-22: 100 mg via INTRAVENOUS

## 2022-03-22 MED ORDER — SUGAMMADEX SODIUM 200 MG/2ML IV SOLN
INTRAVENOUS | Status: DC | PRN
  Administered 2022-03-22: 140.6 mg via INTRAVENOUS

## 2022-03-22 MED ORDER — KETOROLAC TROMETHAMINE 30 MG/ML IJ SOLN
INTRAMUSCULAR | Status: DC | PRN
  Administered 2022-03-22: 7.5 mg via INTRAVENOUS

## 2022-03-22 MED ORDER — ISOSORBIDE MONONITRATE ER 60 MG PO TB24
30.0000 mg | ORAL_TABLET | Freq: Every day | ORAL | Status: DC
  Administered 2022-03-23 – 2022-03-24 (×2): 30 mg via ORAL
  Filled 2022-03-22 (×2): qty 1

## 2022-03-22 MED ORDER — FENTANYL CITRATE (PF) 250 MCG/5ML IJ SOLN
INTRAMUSCULAR | Status: DC | PRN
  Administered 2022-03-22: 25 ug via INTRAVENOUS

## 2022-03-22 MED ORDER — FENTANYL CITRATE (PF) 100 MCG/2ML IJ SOLN
INTRAMUSCULAR | Status: AC
  Filled 2022-03-22: qty 2

## 2022-03-22 SURGICAL SUPPLY — 37 items
ADH SKN CLS APL DERMABOND .7 (GAUZE/BANDAGES/DRESSINGS) ×1
APPLIER CLIP 9.375 SM OPEN (CLIP) ×1
APR CLP SM 9.3 20 MLT OPN (CLIP) ×1
CLIP APPLIE 9.375 SM OPEN (CLIP) IMPLANT
CLOTH BEACON ORANGE TIMEOUT ST (SAFETY) ×1 IMPLANT
COVER LIGHT HANDLE STERIS (MISCELLANEOUS) ×2 IMPLANT
DERMABOND ADVANCED .7 DNX12 (GAUZE/BANDAGES/DRESSINGS) ×1 IMPLANT
DRAIN PENROSE 0.5X18 (DRAIN) ×1 IMPLANT
ELECT REM PT RETURN 9FT ADLT (ELECTROSURGICAL) ×1
ELECTRODE REM PT RTRN 9FT ADLT (ELECTROSURGICAL) ×1 IMPLANT
GAUZE SPONGE 4X4 12PLY STRL (GAUZE/BANDAGES/DRESSINGS) ×1 IMPLANT
GLOVE BIO SURGEON STRL SZ 6.5 (GLOVE) ×1 IMPLANT
GLOVE BIOGEL PI IND STRL 6.5 (GLOVE) ×1 IMPLANT
GLOVE BIOGEL PI IND STRL 7.0 (GLOVE) ×2 IMPLANT
GOWN STRL REUS W/TWL LRG LVL3 (GOWN DISPOSABLE) ×3 IMPLANT
INST SET MINOR GENERAL (KITS) ×1 IMPLANT
KIT TURNOVER KIT A (KITS) ×1 IMPLANT
MANIFOLD NEPTUNE II (INSTRUMENTS) ×1 IMPLANT
MESH HERNIA 1.6X1.9 PLUG LRG (Mesh General) IMPLANT
MESH MARLEX PLUG MEDIUM (Mesh General) IMPLANT
NDL HYPO 18GX1.5 BLUNT FILL (NEEDLE) ×1 IMPLANT
NEEDLE HYPO 18GX1.5 BLUNT FILL (NEEDLE) ×1 IMPLANT
NS IRRIG 1000ML POUR BTL (IV SOLUTION) ×1 IMPLANT
PACK MINOR (CUSTOM PROCEDURE TRAY) ×1 IMPLANT
PAD ARMBOARD 7.5X6 YLW CONV (MISCELLANEOUS) ×1 IMPLANT
SET BASIN LINEN APH (SET/KITS/TRAYS/PACK) ×1 IMPLANT
SOL PREP PROV IODINE SCRUB 4OZ (MISCELLANEOUS) ×1 IMPLANT
SUT MNCRL AB 4-0 PS2 18 (SUTURE) ×1 IMPLANT
SUT NOVA NAB GS-22 2 2-0 T-19 (SUTURE) IMPLANT
SUT SILK 3 0 (SUTURE) ×1
SUT SILK 3-0 18XBRD TIE 12 (SUTURE) IMPLANT
SUT VIC AB 2-0 CT1 27 (SUTURE) ×1
SUT VIC AB 2-0 CT1 TAPERPNT 27 (SUTURE) ×1 IMPLANT
SUT VIC AB 3-0 SH 27 (SUTURE) ×1
SUT VIC AB 3-0 SH 27X BRD (SUTURE) ×1 IMPLANT
SUT VICRYL AB 3 0 TIES (SUTURE) IMPLANT
SYR 30ML LL (SYRINGE) ×1 IMPLANT

## 2022-03-22 NOTE — Anesthesia Preprocedure Evaluation (Signed)
Anesthesia Evaluation  Patient identified by MRN, date of birth, ID band Patient awake    Reviewed: Allergy & Precautions, H&P , NPO status , Patient's Chart, lab work & pertinent test results, reviewed documented beta blocker date and time   Airway Mallampati: II  TM Distance: >3 FB Neck ROM: full    Dental no notable dental hx.    Pulmonary neg pulmonary ROS, former smoker   Pulmonary exam normal breath sounds clear to auscultation       Cardiovascular Exercise Tolerance: Good hypertension, + CAD and +CHF  negative cardio ROS + dysrhythmias Atrial Fibrillation + pacemaker  Rhythm:regular Rate:Normal     Neuro/Psych  PSYCHIATRIC DISORDERS  Depression    negative neurological ROS  negative psych ROS   GI/Hepatic negative GI ROS, Neg liver ROS,,,  Endo/Other  negative endocrine ROSdiabetes, Type 2    Renal/GU negative Renal ROS  negative genitourinary   Musculoskeletal   Abdominal   Peds  Hematology negative hematology ROS (+)   Anesthesia Other Findings  1. Left ventricular ejection fraction, by estimation, is 60 to 65%. The  left ventricle has normal function. The left ventricle has no regional  wall motion abnormalities. There is mild left ventricular hypertrophy.  Left ventricular diastolic function  could not be evaluated.   2. Right ventricular systolic function is low normal. The right  ventricular size is normal. There is mildly elevated pulmonary artery  systolic pressure. The estimated right ventricular systolic pressure is  99991111 mmHg.   3. Left atrial size was severely dilated.   4. The mitral valve is abnormal. Trivial mitral valve regurgitation. Mild  mitral stenosis. The mean mitral valve gradient is 3.0 mmHg. Severe mitral  annular calcification   5. The aortic valve is tricuspid. Aortic valve regurgitation is mild to  moderate. No aortic stenosis is present.   6. Aortic dilatation noted. There  is borderline dilatation of the aortic  root, measuring 37 mm.   7. The inferior vena cava is dilated in size with >50% respiratory  variability, suggesting right atrial pressure of 8 mmHg.     Reproductive/Obstetrics negative OB ROS                             Anesthesia Physical Anesthesia Plan  ASA: 3  Anesthesia Plan: General and General ETT   Post-op Pain Management:    Induction:   PONV Risk Score and Plan: Ondansetron  Airway Management Planned:   Additional Equipment:   Intra-op Plan:   Post-operative Plan:   Informed Consent: I have reviewed the patients History and Physical, chart, labs and discussed the procedure including the risks, benefits and alternatives for the proposed anesthesia with the patient or authorized representative who has indicated his/her understanding and acceptance.     Dental Advisory Given  Plan Discussed with: CRNA  Anesthesia Plan Comments:        Anesthesia Quick Evaluation

## 2022-03-22 NOTE — Op Note (Signed)
[facility] Operative Note  03/22/22  Preoperative Diagnosis: Incarcerated Right inguinal hernia    Postoperative Diagnosis: Same   Procedure(s) Performed: Right inguinal hernia repair with mesh   Surgeon: Lanell Matar. Constance Haw, MD   Assistants: No qualified resident was available   Anesthesia: General endotracheal   Anesthesiologist: Louann Sjogren, MD    Specimens: None   Estimated Blood Loss: Minimal   Blood Replacement: None    Complications: None   Wound Class: Clean   Operative Indications: Ms. Balingit is a 89 yo who had an incarcerated right inguinal hernia that took me over 1 hour to get reduced. We opted for repair due to the risk of incarceration again. We discussed risk of bleeding, infection, recurrence, and use of mesh. Given her Eliquis use we had to schedule her surgery a few days after we had held it.   Findings: Right direct inguinal hernia   Procedure: The patient was taken to the operating room and placed supine. General endotracheal anesthesia was induced. Intravenous antibiotics were  administered per protocol.  A time out was preformed verifying the correct patient, procedure, site, positioning and implants.  The right groin and scrotum were prepared and draped in the usual sterile fashion.   An incision was made in a natural skin crease between the pubic tubercle and the anterior superior iliac spine.  The incision was deepened with electrocautery through Scarpa's and Camper's fascia until the aponeurosis of the external oblique was encountered.  This was cleaned and the external ring was exposed.  An incision was made in the midportion of the external oblique aponeurosis in the direction of its fibers. The ilioinguinal nerve was identified and was protected throughout the dissection.  Flaps of the external oblique were developed cephalad and inferiorly.    The hernia and round ligament were identified. The hernia was a direct hernia. The  hernia sac was freed from the floor of the canal and a small clip was placed on a branch of the epigastric which was lateral to the hernia sac. The round ligament was ligated with a 3-0 Silk and divided.  A large Perfix Plug was placed into the defect and filled the space. Novafil 2-0 secured it to the floor X 3.  Attention was then turned to the exit point of the round ligament. No obvious defect or hernia sac was noted, but I did place a medium Perfix plug into this area due to laxity. This was secured to the internal ring with 2-0 Novafil.  The Perfix Mesh Patch was sutured to the inguinal ligament inferiorly starting at the pubic tubercle using 2-0 Novafil interrupted sutures.  The mesh was sutured superiorly to the conjoint tendon using 2-0 Novafil interrupted sutures.  Care was taken to ensure the mesh was placed in a relaxed fashion to avoid excessive tension and no neurovascular structures were caught in the repair.  Laterally the tails of the mesh were crossed and secured together. The ilioinguinal nerve was coming through the small defect left in the mesh and was not injured. Robynn Pane was layered throughout the repair.   Hemostasis was adequate.  The external oblique aponeurosis was closed with a 2-0 Vicryl suture in a running fashion, taking care to not catch the ilioinguinal nerve in the suture line.  Scarpa's fashion was closed with 3-0 Vicryl interrupted sutures. The skin was closed with a subcuticular 4-0 Monocryl suture.  Dermabond was applied.   The patient tolerated the procedure well and was taken to the  PACU in stable condition. All counts were correct at the end of the case.        Curlene Labrum, MD [facility] [address] [place], [place] [address] [phone] (office)

## 2022-03-22 NOTE — Discharge Instructions (Signed)
Discharge Instructions Hernia:  Common Complaints: Pain at the incision site is common. This will improve with time. Take your pain medications as described below. Some nausea is common and poor appetite. The main goal is to stay hydrated the first few days after surgery.  Numbness at the incision or the thigh is common.  If you start to have burning or tingling pain in your groin, this is from a nerve being pinched. Please call and we can prescribe you a different type of pain medication for nerve pain.  Bruising in the side of the repair is common. Ice the side for the first 72 hours.   Diet/ Activity: Diet as tolerated. You may not have an appetite, but it is important to stay hydrated. Drink 64 ounces of water a day. Your appetite will return with time.  Shower per your regular routine daily.  Do not take hot showers. Take warm showers that are less than 10 minutes. Rest and listen to your body, but do not remain in bed all day.  Walk everyday for at least 15-20 minutes. Deep cough and move around every 1-2 hours in the first few days after surgery.  Do not pick at the dermabond glue on your incision sites.  This glue film will remain in place for 1-2 weeks and will start to peel off.  Do not place lotions or balms on your incision unless instructed to specifically by Dr. Constance Haw.  Do not lift > 10 lbs, perform excessive bending, pushing, pulling, squatting for 6-8 weeks after surgery.   Pain Expectations and Narcotics: -After surgery you will have pain associated with your incisions and this is normal. The pain is muscular and nerve pain, and will get better with time. -You are encouraged and expected to take non narcotic medications like tylenol and ibuprofen (when able) to treat pain as multiple modalities can aid with pain treatment. -Narcotics are only used when pain is severe or there is breakthrough pain. -You are not expected to have a pain score of 0 after surgery, as we cannot  prevent pain. A pain score of 3-4 that allows you to be functional, move, walk, and tolerate some activity is the goal. The pain will continue to improve over the days after surgery and is dependent on your surgery. -Due to [place] law, we are only able to give a certain amount of pain medication to treat post operative pain, and we only give additional narcotics on a patient by patient basis.  -For most laparoscopic surgery, studies have shown that the majority of patients only need 10-15 narcotic pills, and for open surgeries most patients only need 15-20.   -Having appropriate expectations of pain and knowledge of pain management with non narcotics is important as we do not want anyone to become addicted to narcotic pain medication.  -Using ice packs in the first 48 hours and heating pads after 48 hours, wearing an abdominal binder (when recommended), and using over the counter medications are all ways to help with pain management.   -Simple acts like meditation and mindfulness practices after surgery can also help with pain control and research has proven the benefit of these practices.  Medication: Take tylenol and ibuprofen as needed for pain control, alternating every 4-6 hours.  Example:  Tylenol '1000mg'$  @ 6am, 12noon, 6pm, 16mdnight (Do not exceed '4000mg'$  of tylenol a day). Ibuprofen '800mg'$  @ 9am, 3pm, 9pm, 3am (Do not exceed '3600mg'$  of ibuprofen a day).  Take Roxicodone for breakthrough pain every  4 hours.  Take Colace for constipation related to narcotic pain medication. If you do not have a bowel movement in 2 days, take Miralax over the counter.  Drink plenty of water to also prevent constipation.   Contact Information: If you have questions or concerns, please call our office, [phone], Monday- Thursday 8AM-5PM and Friday 8AM-12Noon.  If it is after hours or on the weekend, please call [facility], [phone], [phone], and ask to speak to the surgeon on call for Dr. Constance Haw  at [facility].

## 2022-03-22 NOTE — Anesthesia Procedure Notes (Signed)
Procedure Name: Intubation Date/Time: 03/22/2022 11:14 AM  Performed by: Maude Leriche, CRNAPre-anesthesia Checklist: Patient identified, Emergency Drugs available, Suction available and Patient being monitored Patient Re-evaluated:Patient Re-evaluated prior to induction Oxygen Delivery Method: Circle system utilized Preoxygenation: Pre-oxygenation with 100% oxygen Induction Type: IV induction Ventilation: Mask ventilation without difficulty Laryngoscope Size: Miller and 2 Grade View: Grade I Tube type: Oral Tube size: 6.5 mm Number of attempts: 1 Airway Equipment and Method: Stylet Placement Confirmation: ETT inserted through vocal cords under direct vision, positive ETCO2 and breath sounds checked- equal and bilateral Secured at: 20 cm Tube secured with: Tape Dental Injury: Teeth and Oropharynx as per pre-operative assessment

## 2022-03-22 NOTE — Progress Notes (Signed)
[facility]  Updated daughter and team. Repair completed. Diet and hopefully home tomorrow.  Curlene Labrum, MD [facility] [address] [place], [place] [address] [phone] (office)

## 2022-03-22 NOTE — Transfer of Care (Signed)
Immediate Anesthesia Transfer of Care Note  Patient: Brittany [name]  Procedure(s) Performed: HERNIA REPAIR INGUINAL ADULT (Right: Groin)  Patient Location: PACU  Anesthesia Type:General  Level of Consciousness: awake, alert , pateint uncooperative, and confused  Airway & Oxygen Therapy: Patient Spontanous Breathing  Post-op Assessment: Report given to RN, Post -op Vital signs reviewed and stable, Patient moving all extremities X 4, and Patient able to stick tongue midline  Post vital signs: Reviewed  Last Vitals:  Vitals Value Taken Time  BP 115/78 03/22/22 1222  Temp 97.6   Pulse 73 03/22/22 1224  Resp 26 03/22/22 1224  SpO2 96 % 03/22/22 1224  Vitals shown include unvalidated device data.  Last Pain:  Vitals:   03/22/22 1002  TempSrc: Oral  PainSc: 0-No pain         Complications: No notable events documented.

## 2022-03-22 NOTE — Interval H&P Note (Signed)
History and Physical Interval Note:  03/22/2022 10:41 AM  Brittany [name]  has presented today for surgery, with the diagnosis of inguinal hernia repair.  The various methods of treatment have been discussed with the patient and family. After consideration of risks, benefits and other options for treatment, the patient has consented to  Procedure(s): HERNIA REPAIR INGUINAL ADULT (Right) as a surgical intervention.  The patient's history has been reviewed, patient examined, no change in status, stable for surgery.  I have reviewed the patient's chart and labs.  Questions were answered to the patient's satisfaction.     Virl Cagey

## 2022-03-22 NOTE — Progress Notes (Signed)
PROGRESS NOTE    Brittany [name]  [id number] DOB: 10-Sep-1927 DOA: 03/19/2022 PCP: Bernerd Limbo, MD    Brief Narrative:   Brittany [name] is a 89 y.o. female with past medical history significant for HTN, HLD, CHB s/p PPM, type 2 diabetes mellitus, chronic diastolic congestive heart failure, atrial flutter on Eliquis who presented to [facility] ED on 3/10 via EMS from home complaining of abdominal pain surrounding her known hernia.  Patient's hernia has been more protrusive causing pain and usually will be able to self reduce when laying flat; but now unable to reduce.  Patient also utilizes a belly binder to prevent further protrusion of the hernia.  In the ED, temperature 97.9 F, HR 79, RR 16, BP 131/67, SpO2 97% on room air.  WBC 6.5, hemoglobin 12.1, platelets 266.  Sodium 135, potassium 3.1, chloride 104, CO2 25, glucose 137, BUN 10, creatinine 0.69.  AST 22, ALT 18, total bilirubin 0.8.  Lactic acid 2.2.  CT abdomen/pelvis with contrast with incarcerated right inguinal hernia with cecal closed-loop obstruction, ileocecal valve and base of appendix also located within the hernia with findings concerning for ischemia; colonic diverticulosis without acute diverticulitis, cholelithiasis without evidence of acute cholecystitis, multiple fluid density pancreatic lesions measuring up to 2.6 cm likely representative of intraductal papillary mucinous neoplasm, aortic atherosclerosis.  General surgery was consulted and right incarcerated inguinal hernia was reduced.  Repeat CT pelvis with decreased size of the right inguinal hernia which contains fat and fluid, no bowel loops are present in the right inguinal hernia with decreased distention and wall thickening of the cecum after reduction, ileocecal valve/appendix no longer within the hernia.  TRH was consulted for admission for planned operative management of right inguinal hernia.  Assessment & Plan:   Incarcerated right inguinal  hernia Patient presenting to ED with abdominal pain with protrusion of her known right inguinal hernia.  Was found on imaging to have a incarcerated right inguinal hernia with cecal obstruction and ileocecal valve/appendix located within the hernia.  General surgery was consulted and hernia was reduced at bedside.  Given incarcerated hernia, general surgery recommended hernia repair to prevent further events in the future. -- General surgery following, appreciate assistance -- Evaluated by cardiology for restratification -- Holding Eliquis -- Plan inguinal hernia repair today, n.p.o.  Pancreatic lesions CT abdomen/pelvis with contrast showed multiple fluid filled density pancreatic lesions measuring up to 2.6 cm, likely representative of intraductal papillary mucinous neoplasms.  Outpatient follow-up with PCP, consideration of dedicated MRI pancreatic protocol for further evaluation.  Chronic diastolic congestive heart failure, compensated Essential hypertension TTE 03/20/2022 with LVEF 60 to 65%, no LV regional wall motion normalities, mild LVH, LA severely dilated, trivial MR, borderline dilation aortic root measuring 37 mm, IVC dilated; no change from prior study. --Irbesartan 37.5 mg p.o. daily -- Restart home Lasix tomorrow  Complete heart block s/p PPM Patient has a Chemical engineer PPM in place, device approaching ERI roughly 4.3 months.  Outpatient follow-up with cardiology.  CAD Currently on Eliquis, not on aspirin.  Type 2 diabetes mellitus On metformin 5 mg p.o. daily at home.  Atrial flutter -- Holding Eliquis as above    DVT prophylaxis: SCD's Start: 03/21/22 1546 Place and maintain sequential compression device Start: 03/20/22 1745 SCDs Start: 03/19/22 2210    Code Status: DNR Family Communication: No family present at bedside this morning  Disposition Plan:  Level of care: Telemetry Status is: Inpatient Remains inpatient appropriate because: Planned surgical  hernia  repair today, possible discharge home tomorrow    Consultants:  General surgery, Dr. Constance Haw  Procedures:  Right inguinal hernia repair with mesh, Dr. Constance Haw 3/13  Antimicrobials:  Perioperative cefotetan   Subjective: Patient seen examined bedside, resting comfortably.  Lying in bed.  No specific complaints this morning.  Awaiting operative management for her incarcerated inguinal hernia today.  Denies headache, no dizziness, no chest pain, no palpitations, no shortness of breath, no current abdominal pain, no fever/chills/night sweats, no nausea/vomiting/diarrhea.  No acute events overnight per nursing staff.  Objective: Vitals:   03/21/22 1314 03/21/22 1939 03/22/22 0350 03/22/22 1002  BP: 139/60 (!) 158/63 (!) 148/42 (!) 137/57  Pulse: 71 72 72 75  Resp: '18 18 18 18  '$ Temp: 97.9 F (36.6 C) 97.7 F (36.5 C) 98.4 F (36.9 C) 99.4 F (37.4 C)  TempSrc: Oral Oral Oral Oral  SpO2: 100% 100%  94%  Weight:   70.3 kg 70.3 kg  Height:    '4\' 11"'$  (1.499 m)    Intake/Output Summary (Last 24 hours) at 03/22/2022 1207 Last data filed at 03/22/2022 0912 Gross per 24 hour  Intake 840 ml  Output 850 ml  Net -10 ml   Filed Weights   03/21/22 0346 03/22/22 0350 03/22/22 1002  Weight: 68.4 kg 70.3 kg 70.3 kg    Examination:  Physical Exam: GEN: NAD, alert and oriented x 3, elderly in appearance HEENT: NCAT, PERRL, EOMI, sclera clear, MMM PULM: CTAB w/o wheezes/crackles, normal respiratory effort, on room air CV: RRR w/o M/G/R GI: abd soft, NTND, NABS MSK: no peripheral edema, moves all extremity independently NEURO: CN II-XII intact, no focal deficits, sensation to light touch intact PSYCH: normal mood/affect Integumentary: dry/intact, no rashes or wounds    Data Reviewed: I have personally reviewed following labs and imaging studies  CBC: Recent Labs  Lab 03/19/22 1745 03/20/22 0435 03/21/22 0900 03/22/22 0457  WBC 6.5 6.1 6.0 8.0  NEUTROABS 3.9  --   --   5.6  HGB 12.1 10.8* 11.0* 11.3*  HCT 36.8 33.4* 33.0* 34.1*  MCV 90.4 92.5 90.7 91.2  PLT 266 229 187 [id number]   Basic Metabolic Panel: Recent Labs  Lab 03/19/22 1745 03/20/22 0435 03/21/22 0900 03/22/22 0457  NA 135 139 137 138  K 3.1* 4.0 3.6 4.0  CL 104 107 108 106  CO2 '25 27 24 24  '$ GLUCOSE 137* 84 93 104*  BUN 10 10 6* 7*  CREATININE 0.69 0.62 0.57 0.68  CALCIUM 9.8 9.4 9.5 9.7  MG  --  2.0 1.9 1.9  PHOS  --  2.8 2.3* 2.1*   GFR: Estimated Creatinine Clearance: 37.5 mL/min (by C-G formula based on SCr of 0.68 mg/dL). Liver Function Tests: Recent Labs  Lab 03/19/22 1745 03/20/22 0435 03/22/22 0457  AST '22 15 15  '$ ALT '18 14 11  '$ ALKPHOS 64 55 54  BILITOT 0.8 0.8 1.0  PROT 7.1 5.9* 6.0*  ALBUMIN 3.7 3.2* 3.0*   No results for input(s): "LIPASE", "AMYLASE" in the last 168 hours. No results for input(s): "AMMONIA" in the last 168 hours. Coagulation Profile: No results for input(s): "INR", "PROTIME" in the last 168 hours. Cardiac Enzymes: No results for input(s): "CKTOTAL", "CKMB", "CKMBINDEX", "TROPONINI" in the last 168 hours. BNP (last 3 results) No results for input(s): "PROBNP" in the last 8760 hours. HbA1C: No results for input(s): "HGBA1C" in the last 72 hours. CBG: Recent Labs  Lab 03/21/22 1942 03/21/22 2341 03/22/22 0356 03/22/22 0727 03/22/22 1022  GLUCAP 161* 80 107* 103* 100*   Lipid Profile: No results for input(s): "CHOL", "HDL", "LDLCALC", "TRIG", "CHOLHDL", "LDLDIRECT" in the last 72 hours. Thyroid Function Tests: No results for input(s): "TSH", "T4TOTAL", "FREET4", "T3FREE", "THYROIDAB" in the last 72 hours. Anemia Panel: No results for input(s): "VITAMINB12", "FOLATE", "FERRITIN", "TIBC", "IRON", "RETICCTPCT" in the last 72 hours. Sepsis Labs: Recent Labs  Lab 03/19/22 1745 03/19/22 2055  LATICACIDVEN 2.2* 1.5    Recent Results (from the past 240 hour(s))  Surgical pcr screen     Status: None   Collection Time: 03/21/22  7:00 PM    Specimen: Nasal Mucosa; Nasal Swab  Result Value Ref Range Status   MRSA, PCR NEGATIVE NEGATIVE Final   Staphylococcus aureus NEGATIVE NEGATIVE Final    Comment: (NOTE) The Xpert SA Assay (FDA approved for NASAL specimens in patients 42 years of age and older), is one component of a comprehensive surveillance program. It is not intended to diagnose infection nor to guide or monitor treatment. Performed at [facility], [address] [place], [place] [address]          Radiology Studies: ECHOCARDIOGRAM COMPLETE  Result Date: 03/20/2022    ECHOCARDIOGRAM REPORT   Patient Name:   Brittany [name] Lie Date of Exam: 03/20/2022 Medical Rec #:  [id number]       Height:       59.0 in Accession #:    [id number]      Weight:       146.6 lb Date of Birth:  06-Mar-1927       BSA:          1.616 m Patient Age:    31 years        BP:           139/57 mmHg Patient Gender: F               HR:           76 bpm. Exam Location:  [facility] Procedure: 2D Echo, Cardiac Doppler and Color Doppler Indications:    Aortic regurgitation I35.1  History:        Patient has prior history of Echocardiogram examinations, most                 recent 06/15/2020. CHF, CAD, Pacemaker, Arrythmias:Atrial Flutter                 and LBBB; Risk Factors:Hypertension, Diabetes and Dyslipidemia.  Sonographer:    Alvino Chapel RCS Referring Phys: [id number] VISHNU P MALLIPEDDI IMPRESSIONS  1. Left ventricular ejection fraction, by estimation, is 60 to 65%. The left ventricle has normal function. The left ventricle has no regional wall motion abnormalities. There is mild left ventricular hypertrophy. Left ventricular diastolic function could not be evaluated.  2. Right ventricular systolic function is low normal. The right ventricular size is normal. There is mildly elevated pulmonary artery systolic pressure. The estimated right ventricular systolic pressure is 99991111 mmHg.  3. Left atrial size was severely dilated.  4. The mitral valve is abnormal.  Trivial mitral valve regurgitation. Mild mitral stenosis. The mean mitral valve gradient is 3.0 mmHg. Severe mitral annular calcification  5. The aortic valve is tricuspid. Aortic valve regurgitation is mild to moderate. No aortic stenosis is present.  6. Aortic dilatation noted. There is borderline dilatation of the aortic root, measuring 37 mm.  7. The inferior vena cava is dilated in size with >50% respiratory variability, suggesting right atrial pressure of 8 mmHg.  Comparison(s): No significant change from prior study. FINDINGS  Left Ventricle: Left ventricular ejection fraction, by estimation, is 60 to 65%. The left ventricle has normal function. The left ventricle has no regional wall motion abnormalities. The left ventricular internal cavity size was normal in size. There is  mild left ventricular hypertrophy. Left ventricular diastolic function could not be evaluated due to paced rhythm. Left ventricular diastolic function could not be evaluated. Right Ventricle: The right ventricular size is normal. No increase in right ventricular wall thickness. Right ventricular systolic function is low normal. There is mildly elevated pulmonary artery systolic pressure. The tricuspid regurgitant velocity is 2.91 m/s, and with an assumed right atrial pressure of 8 mmHg, the estimated right ventricular systolic pressure is 99991111 mmHg. Left Atrium: Left atrial size was severely dilated. Right Atrium: Right atrial size was normal in size. Pericardium: There is no evidence of pericardial effusion. Mitral Valve: The mitral valve is abnormal. Severe mitral annular calcification. Trivial mitral valve regurgitation. Mild mitral valve stenosis. MV peak gradient, 11.8 mmHg. The mean mitral valve gradient is 3.0 mmHg. Tricuspid Valve: The tricuspid valve is abnormal. Tricuspid valve regurgitation is mild . No evidence of tricuspid stenosis. Aortic Valve: The aortic valve is tricuspid. Aortic valve regurgitation is mild to moderate.  Aortic regurgitation PHT measures 387 msec. No aortic stenosis is present. Pulmonic Valve: The pulmonic valve was not well visualized. Pulmonic valve regurgitation is mild. No evidence of pulmonic stenosis. Aorta: Aortic dilatation noted. There is borderline dilatation of the aortic root, measuring 37 mm. Venous: The inferior vena cava is dilated in size with greater than 50% respiratory variability, suggesting right atrial pressure of 8 mmHg. IAS/Shunts: No atrial level shunt detected by color flow Doppler. Additional Comments: A device lead is visualized in the right ventricle and right atrium.  LEFT VENTRICLE PLAX 2D LVIDd:         4.60 cm LVIDs:         3.20 cm LV PW:         1.20 cm LV IVS:        1.20 cm LVOT diam:     2.00 cm LV SV:         64 LV SV Index:   39 LVOT Area:     3.14 cm  RIGHT VENTRICLE TAPSE (M-mode): 1.9 cm LEFT ATRIUM              Index        RIGHT ATRIUM           Index LA diam:        4.65 cm  2.88 cm/m   RA Area:     18.20 cm LA Vol (A2C):   106.0 ml 65.58 ml/m  RA Volume:   48.90 ml  30.25 ml/m LA Vol (A4C):   123.0 ml 76.10 ml/m LA Biplane Vol: 116.0 ml 71.77 ml/m  AORTIC VALVE LVOT Vmax:   89.50 cm/s LVOT Vmean:  58.900 cm/s LVOT VTI:    0.203 m AI PHT:      387 msec  AORTA Ao Root diam: 3.70 cm MITRAL VALVE                TRICUSPID VALVE MV Area (PHT): 3.17 cm     TR Peak grad:   33.9 mmHg MV Area VTI:   1.79 cm     TR Vmax:        291.00 cm/s MV Peak grad:  11.8 mmHg MV Mean grad:  3.0  mmHg     SHUNTS MV Vmax:       1.72 m/s     Systemic VTI:  0.20 m MV Vmean:      70.9 cm/s    Systemic Diam: 2.00 cm MV Decel Time: 239 msec MV E velocity: 147.00 cm/s MV A velocity: 36.70 cm/s MV E/A ratio:  4.01 Vishnu Priya Mallipeddi Electronically signed by Lorelee Cover Mallipeddi Signature Date/Time: 03/20/2022/3:57:23 PM    Final         Scheduled Meds:  [MAR Hold] insulin aspart  0-9 Units Subcutaneous Q4H   [MAR Hold] irbesartan  37.5 mg Oral Daily   [MAR Hold] polyethylene  glycol  17 g Oral BID   [MAR Hold] senna-docusate  2 tablet Oral BID   Continuous Infusions:   LOS: 3 days    Time spent: 51 minutes spent on chart review, discussion with nursing staff, consultants, updating family and interview/physical exam; more than 50% of that time was spent in counseling and/or coordination of care.    Sheridan Gettel J [place] DO [facility] Available via Epic secure chat 7am-7pm After these hours, please refer to coverage provider listed on [email or web address] 03/22/2022, 12:07 PM

## 2022-03-23 DIAGNOSIS — K4030 Unilateral inguinal hernia, with obstruction, without gangrene, not specified as recurrent: Secondary | ICD-10-CM | POA: Diagnosis not present

## 2022-03-23 LAB — BASIC METABOLIC PANEL
Anion gap: 7 (ref 5–15)
BUN: 12 mg/dL (ref 8–23)
CO2: 25 mmol/L (ref 22–32)
Calcium: 10 mg/dL (ref 8.9–10.3)
Chloride: 103 mmol/L (ref 98–111)
Creatinine, Ser: 0.79 mg/dL (ref 0.44–1.00)
GFR, Estimated: >60 mL/min (ref >60)
Glucose, Bld: 137 mg/dL — ABNORMAL HIGH (ref 70–99)
Potassium: 3.4 mmol/L — ABNORMAL LOW (ref 3.5–5.1)
Sodium: 135 mmol/L (ref 135–145)

## 2022-03-23 LAB — GLUCOSE, CAPILLARY
Glucose-Capillary: 63 mg/dL — ABNORMAL LOW (ref 70–99)
Glucose-Capillary: 64 mg/dL — ABNORMAL LOW (ref 70–99)
Glucose-Capillary: 102 mg/dL — ABNORMAL HIGH (ref 70–99)
Glucose-Capillary: 117 mg/dL — ABNORMAL HIGH (ref 70–99)
Glucose-Capillary: 142 mg/dL — ABNORMAL HIGH (ref 70–99)
Glucose-Capillary: 112 mg/dL — ABNORMAL HIGH (ref 70–99)
Glucose-Capillary: 135 mg/dL — ABNORMAL HIGH (ref 70–99)

## 2022-03-23 LAB — CBC
HCT: 37.3 % (ref 36.0–46.0)
Hemoglobin: 12.2 g/dL (ref 12.0–15.0)
MCH: 29.9 pg (ref 26.0–34.0)
MCHC: 32.7 g/dL (ref 30.0–36.0)
MCV: 91.4 fL (ref 80.0–100.0)
Platelets: 199 10*3/uL (ref 150–400)
RBC: 4.08 MIL/uL (ref 3.87–5.11)
RDW: 14.2 % (ref 11.5–15.5)
WBC: 10.2 10*3/uL (ref 4.0–10.5)
nRBC: 0 % (ref 0.0–0.2)

## 2022-03-23 MED ORDER — POTASSIUM CHLORIDE CRYS ER 20 MEQ PO TBCR
30.0000 meq | EXTENDED_RELEASE_TABLET | ORAL | Status: AC
  Administered 2022-03-23 (×2): 30 meq via ORAL
  Filled 2022-03-23 (×2): qty 2

## 2022-03-23 MED ORDER — APIXABAN 5 MG PO TABS
5.0000 mg | ORAL_TABLET | Freq: Two times a day (BID) | ORAL | Status: DC
  Administered 2022-03-24: 5 mg via ORAL
  Filled 2022-03-23: qty 1

## 2022-03-23 NOTE — Evaluation (Signed)
Physical Therapy Evaluation Patient Details Name: Brittany [name] MRN: [id number] DOB: [birth year] Today's Date: 03/23/2022  History of Present Illness  Brittany [name] is a 89 y.o. female s/p Right inguinal hernia repair with mesh on 03/22/22, with medical history significant of hypertension, hyperlipidemia, history of atrial flutter, complete heart block status post pacemaker implantation, type 2 diabetes mellitus, chronic diastolic heart failure who presented to the emergency department from home via EMS due to pain in the right groin area.  Patient has a history of right inguinal hernia which has been ongoing for about the ED and was usually reducible when she lays down in the past, she weighs a belly binder which helps with prevention of protrusion of the hernia.  However, she presented with right groin pain this morning with difficulty in being able to reduce the hernia.  EMS was activated and patient was taken to the ED for further evaluation and management.  Patient has had a bowel movement this morning, she took Eliquis this morning and she denies chest pain, shortness of breath, nausea, vomiting.   Clinical Impression  Patient has difficulty sitting up at beside due to generalized weakness and c/o severe groin and abdominal pain at  surgery site.  Patient very unsteady on feet with scissor of legs and limited mostly due to c/o fatigue/generalized weakness.  Patient tolerated sitting up in chair with her daughter present after therapy.  Patient will benefit from continued skilled physical therapy in hospital and recommended venue below to increase strength, balance, endurance for safe ADLs and gait.       Recommendations for follow up therapy are one component of a multi-disciplinary discharge planning process, led by the attending physician.  Recommendations may be updated based on patient status, additional functional criteria and insurance authorization.  Follow Up Recommendations Skilled  nursing-short term rehab (<3 hours/day) Can patient physically be transported by private vehicle: Yes    Assistance Recommended at Discharge Intermittent Supervision/Assistance  Patient can return home with the following  A lot of help with walking and/or transfers;A lot of help with bathing/dressing/bathroom;Help with stairs or ramp for entrance;Assistance with cooking/housework    Equipment Recommendations None recommended by PT  Recommendations for Other Services       Functional Status Assessment Patient has had a recent decline in their functional status and demonstrates the ability to make significant improvements in function in a reasonable and predictable amount of time.     Precautions / Restrictions Precautions Precautions: Fall Restrictions Weight Bearing Restrictions: No      Mobility  Bed Mobility Overal bed mobility: Needs Assistance Bed Mobility: Supine to Sit     Supine to sit: Mod assist     General bed mobility comments: increased time, labored movement with c/o severe pain groin area (skin folds)    Transfers Overall transfer level: Needs assistance Equipment used: Rolling walker (2 wheels) Transfers: Sit to/from Stand, Bed to chair/wheelchair/BSC Sit to Stand: Mod assist   Step pivot transfers: Mod assist       General transfer comment: unsteady labored movement    Ambulation/Gait Ambulation/Gait assistance: Mod assist Gait Distance (Feet): 5 Feet Assistive device: Rolling walker (2 wheels) Gait Pattern/deviations: Decreased step length - right, Decreased step length - left, Decreased stride length, Scissoring, Trunk flexed Gait velocity: slow     General Gait Details: limited to a few slow labored steps with frequent scissoring of legs, near loss of balance and limited mostly due to fatigue/groin pain  Stairs  Wheelchair Mobility    Modified Rankin (Stroke Patients Only)       Balance Overall balance assessment:  Needs assistance Sitting-balance support: Feet supported, No upper extremity supported Sitting balance-Leahy Scale: Fair Sitting balance - Comments: fair/good seated at EOB   Standing balance support: Reliant on assistive device for balance, During functional activity, Bilateral upper extremity supported Standing balance-Leahy Scale: Poor Standing balance comment: fair/poor using RW                             Pertinent Vitals/Pain Pain Assessment Pain Assessment: Faces Faces Pain Scale: Hurts even more Pain Location: groin area with movement Pain Descriptors / Indicators: Guarding, Grimacing, Discomfort, Sharp Pain Intervention(s): Limited activity within patient's tolerance, Monitored during session, Repositioned    Home Living Family/patient expects to be discharged to:: Private residence Living Arrangements: Children Available Help at Discharge: Family;Available 24 hours/day Type of Home: House Home Access: Ramped entrance       Home Layout: Two level;Able to live on main level with bedroom/bathroom;Full bath on main level Home Equipment: Rolling Walker (2 wheels);BSC/3in1;Cane - quad;Shower seat      Prior Function Prior Level of Function : Needs assist       Physical Assist : Mobility (physical);ADLs (physical) Mobility (physical): Bed mobility;Transfers;Gait;Stairs   Mobility Comments: Household ambulator using RW ADLs Comments: assisted by family, sometimes require help for sitting up at bedside     Hand Dominance   Dominant Hand: Right    Extremity/Trunk Assessment   Upper Extremity Assessment Upper Extremity Assessment: Generalized weakness    Lower Extremity Assessment Lower Extremity Assessment: Generalized weakness    Cervical / Trunk Assessment Cervical / Trunk Assessment: Kyphotic  Communication   Communication: No difficulties  Cognition Arousal/Alertness: Awake/alert Behavior During Therapy: WFL for tasks assessed/performed,  Anxious Overall Cognitive Status: Within Functional Limits for tasks assessed                                          General Comments      Exercises     Assessment/Plan    PT Assessment Patient needs continued PT services  PT Problem List Decreased strength;Decreased activity tolerance;Decreased balance;Decreased mobility       PT Treatment Interventions DME instruction;Gait training;Stair training;Functional mobility training;Therapeutic activities;Therapeutic exercise;Patient/family education;Balance training    PT Goals (Current goals can be found in the Care Plan section)  Acute Rehab PT Goals Patient Stated Goal: return home with family to assist PT Goal Formulation: With patient/family Time For Goal Achievement: 04/06/22 Potential to Achieve Goals: Good    Frequency Min 3X/week     Co-evaluation               AM-PAC PT "6 Clicks" Mobility  Outcome Measure Help needed turning from your back to your side while in a flat bed without using bedrails?: A Lot Help needed moving from lying on your back to sitting on the side of a flat bed without using bedrails?: A Lot Help needed moving to and from a bed to a chair (including a wheelchair)?: A Lot Help needed standing up from a chair using your arms (e.g., wheelchair or bedside chair)?: A Lot Help needed to walk in hospital room?: A Lot Help needed climbing 3-5 steps with a railing? : Total 6 Click Score: 11    End of Session  Activity Tolerance: Patient tolerated treatment well;Patient limited by fatigue Patient left: in chair;with call bell/phone within reach;with family/visitor present Nurse Communication: Mobility status PT Visit Diagnosis: Unsteadiness on feet (R26.81);Other abnormalities of gait and mobility (R26.89);Muscle weakness (generalized) (M62.81)    Time: [id number] PT Time Calculation (min) (ACUTE ONLY): 22 min   Charges:   PT Evaluation $PT Eval Moderate Complexity: 1  Mod PT Treatments $Therapeutic Activity: 8-22 mins        1:49 PM, 03/23/22 Lonell Grandchild, MPT Physical Therapist with [facility] [phone] office [phone] mobile phone

## 2022-03-23 NOTE — Anesthesia Postprocedure Evaluation (Signed)
Anesthesia Post Note  Patient: Brittany [name]  Procedure(s) Performed: HERNIA REPAIR INGUINAL ADULT (Right: Groin)  Patient location during evaluation: Phase II Anesthesia Type: General Level of consciousness: awake Pain management: pain level controlled Vital Signs Assessment: post-procedure vital signs reviewed and stable Respiratory status: spontaneous breathing and respiratory function stable Cardiovascular status: blood pressure returned to baseline and stable Postop Assessment: no headache and no apparent nausea or vomiting Anesthetic complications: no Comments: Late entry   No notable events documented.   Last Vitals:  Vitals:   03/23/22 0400 03/23/22 0909  BP: 120/73 (!) 129/53  Pulse: 74 72  Resp: 16   Temp: 36.8 C   SpO2: 99%     Last Pain:  Vitals:   03/23/22 0847  TempSrc:   PainSc: 0-No pain                 Louann Sjogren

## 2022-03-23 NOTE — Plan of Care (Signed)
  Problem: Acute Rehab PT Goals(only PT should resolve) Goal: Pt Will Go Supine/Side To Sit Outcome: Progressing Flowsheets (Taken 03/23/2022 1352) Pt will go Supine/Side to Sit: with minimal assist Goal: Patient Will Transfer Sit To/From Stand Outcome: Progressing Flowsheets (Taken 03/23/2022 1352) Patient will transfer sit to/from stand: with minimal assist Goal: Pt Will Transfer Bed To Chair/Chair To Bed Outcome: Progressing Flowsheets (Taken 03/23/2022 1352) Pt will Transfer Bed to Chair/Chair to Bed: with min assist Goal: Pt Will Ambulate Outcome: Progressing Flowsheets (Taken 03/23/2022 1352) Pt will Ambulate:  25 feet  with minimal assist  with rolling walker   1:53 PM, 03/23/22 Lonell Grandchild, MPT Physical Therapist with [facility] [phone] office [phone] mobile phone

## 2022-03-23 NOTE — Inpatient Diabetes Management (Signed)
Inpatient Diabetes Program Recommendations  AACE/ADA: New Consensus Statement on Inpatient Glycemic Control (2015)  Target Ranges:  Prepandial:   less than 140 mg/dL      Peak postprandial:   less than 180 mg/dL (1-2 hours)      Critically ill patients:  140 - 180 mg/dL   Lab Results  Component Value Date   GLUCAP 117 (H) 03/23/2022   HGBA1C 5.9 (H) 02/28/2022    Review of Glycemic Control  Latest Reference Range & Units 03/22/22 23:09 03/23/22 03:23 03/23/22 03:52 03/23/22 04:50 03/23/22 07:38  Glucose-Capillary 70 - 99 mg/dL 134 (H)  Novolog 1 unit 63 (L) 64 (L) 102 (H) 117 (H)  (H): Data is abnormally high (L): Data is abnormally low  Diabetes history: DM2 Outpatient Diabetes medications: Metformin 500 mg QD Current orders for Inpatient glycemic control: Novolog 0-9 units Q4H  Inpatient Diabetes Program Recommendations:    Please consider:  Noovlog 0-6 units TID and 0-5 units QHS.  Will continue to follow while inpatient.  Thank you, Reche Dixon, MSN, [place] Diabetes Coordinator Inpatient Diabetes Program [phone] (team pager from 8a-5p)

## 2022-03-23 NOTE — Progress Notes (Signed)
[facility] Progress Note  1 Day Post-Op  Subjective: Brittany [name] is a 89 yo female on post op day 1 after a incarcerated right inguinal hernia open repair. Patient is laying up in bed with head down sleeping, table has cards and she was playing solitaire. Patient denies any current pain, but is constantly burping and asking for more juice. No flatus or BM. No n/v. Patient responds to some questions but will continue saying "If wishes were horses, beggars would ride." Patient knows name and that she is in a hospital but does not know the state, year, her birthday, or president. Overnight, she had an acute hypoglycemic episode at 3 am. Corrected with juice. Nurse Velna Hatchet says patient is working with respiratory to get IS and has limited ambulation due to age but can stand by her bed and sit up.  Objective: Vital signs in last 24 hours: Temp:  [97.7 F (36.5 C)-99.4 F (37.4 C)] 98.2 F (36.8 C) (03/14 0400) Pulse Rate:  [71-76] 74 (03/14 0400) Resp:  [15-21] 16 (03/14 0400) BP: (102-137)/(45-104) 120/73 (03/14 0400) SpO2:  [94 %-99 %] 99 % (03/14 0400) Weight:  [67.8 kg-70.3 kg] 67.8 kg (03/14 0319) Last BM Date : 03/20/22 (per documentation)  Intake/Output from previous day: 03/13 0701 - 03/14 0700 In: 1020 [P.O.:720; I.V.:300] Out: 5 [Blood:5] Intake/Output this shift: No intake/output data recorded.  General appearance: alert and no distress Head: Normocephalic, without obvious abnormality, atraumatic Resp: clear to auscultation bilaterally GI: normal findings: bowel sounds normal, no bruits heard, and soft, non-tender Incision/Wound: mild erythema surrounding incision, no discharge, sutures intact  Lab Results:  Recent Labs    03/22/22 0457 03/23/22 0505  WBC 8.0 10.2  HGB 11.3* 12.2  HCT 34.1* 37.3  PLT 168 199   BMET Recent Labs    03/22/22 0457 03/23/22 0505  NA 138 135  K 4.0 3.4*  CL 106 103  CO2 24 25  GLUCOSE 104* 137*  BUN 7* 12   CREATININE 0.68 0.79  CALCIUM 9.7 10.0   PT/INR No results for input(s): "LABPROT", "INR" in the last 72 hours.  Studies/Results: No results found.  Anti-infectives: Anti-infectives (From admission, onward)    Start     Dose/Rate Route Frequency Ordered Stop   03/22/22 0600  cefoTEtan (CEFOTAN) 2 g in sodium chloride 0.9 % 100 mL IVPB        2 g 200 mL/hr over 30 Minutes Intravenous On call to O.R. 03/21/22 1545 03/22/22 1116       Assessment/Plan: s/p Procedure(s): HERNIA REPAIR INGUINAL ADULT  - Potassium supplementation - Phosphorus supplementation - continue SCDs and IS  - continue tylenol prn  - d/c foley - Soft diet - Plan for discharge   LOS: 4 days   Glorious Peach, Medical Student  03/23/2022; 8:42 AM

## 2022-03-23 NOTE — NC FL2 (Signed)
[place] MEDICAID FL2 LEVEL OF CARE FORM     IDENTIFICATION  Patient Name: Brittany [name] Birthdate: 1927-08-29 Sex: female Admission Date (Current Location): 03/19/2022  [facility] and [place] Number:  [employer] and Address:  [place] [address] [place]      Provider Number: [id number]  Attending Physician Name and Address:  [place] Donnamarie Poag, DO  Relative Name and Phone Number:       Current Level of Care: Hospital Recommended Level of Care: [place] Prior Approval Number:    Date Approved/Denied:   PASRR Number: [id number]  Discharge Plan: SNF    Current Diagnoses: Patient Active Problem List   Diagnosis Date Noted   Preop cardiovascular exam 03/21/2022   Incarcerated right inguinal hernia 03/19/2022   Lactic acidosis 03/19/2022   LBBB (left bundle branch block) 03/19/2022   Type 2 diabetes mellitus with hyperglycemia ([place]) 03/19/2022   Constipation 03/19/2022   Obesity (BMI 30-39.9) 03/19/2022   Prolonged QT interval 123456   Acute metabolic encephalopathy 0000000   Hypokalemia 02/28/2022   Leg swelling 06/22/2020   Atrial flutter ([place]) 05/06/2020   CHF (congestive heart failure) ([place]) 01/02/2015   Chronic diastolic CHF (congestive heart failure) ([place]) 01/02/2015   DM2 (diabetes mellitus, type 2) ([place]) 01/02/2015   Belching 10/22/2012   SOB (shortness of breath) 10/22/2012   Lower extremity numbness 06/20/2012   Closed left hip fracture ([place]) 01/18/2012   HYPERSOMNIA UNSPECIFIED 06/23/2008   EDEMA 06/23/2008   GLAUCOMA 05/27/2008   LAD-balloon angioplasty/cutting balloon-2007 05/27/2008   AV BLOCK, COMPLETE 05/27/2008   Pacemaker 05/27/2008   HYPERLIPIDEMIA 11/05/2006   Essential hypertension 11/05/2006    Orientation RESPIRATION BLADDER Height & Weight     Self  Normal Incontinent Weight: 149 lb 7.6 oz (67.8 kg) Height:  '4\' 11"'$  (149.9 cm)  BEHAVIORAL SYMPTOMS/MOOD NEUROLOGICAL  BOWEL NUTRITION STATUS      Incontinent Diet (HH/carb modified)  AMBULATORY STATUS COMMUNICATION OF NEEDS Skin   Limited Assist Verbally Surgical wounds (right groin)                       Personal Care Assistance Level of Assistance  Bathing, Feeding, Dressing Bathing Assistance: Limited assistance Feeding assistance: Independent Dressing Assistance: Limited assistance     Functional Limitations Info             [place]  PT (By licensed PT)     PT Frequency: 5x/week              Contractures Contractures Info: Not present    Additional Factors Info  Code Status, Allergies Code Status Info: DNR Allergies Info: ciprofloaxcin, ace inhibitors, cedax, lipitor, plavix, statins           Current Medications (03/23/2022):  This is the current hospital active medication list Current Facility-Administered Medications  Medication Dose Route Frequency Provider Last Rate Last Admin   acetaminophen (TYLENOL) tablet 650 mg  650 mg Oral Q6H PRN Virl Cagey, MD       Or   acetaminophen (TYLENOL) suppository 650 mg  650 mg Rectal Q6H PRN Virl Cagey, MD       [START ON 03/24/2022] apixaban (ELIQUIS) tablet 5 mg  5 mg Oral BID [place] Eric J, DO       furosemide (LASIX) tablet 40 mg  40 mg Oral Daily [place] Donnamarie Poag, DO   40 mg at 03/23/22 0905   HYDROmorphone (DILAUDID)  injection 0.5 mg  0.5 mg Intravenous Q4H PRN Virl Cagey, MD       insulin aspart (novoLOG) injection 0-9 Units  0-9 Units Subcutaneous Q4H Virl Cagey, MD   1 Units at 03/23/22 1223   irbesartan (AVAPRO) tablet 37.5 mg  37.5 mg Oral Daily Virl Cagey, MD   37.5 mg at 03/23/22 0910   isosorbide mononitrate (IMDUR) 24 hr tablet 30 mg  30 mg Oral Daily [place] Eric J, DO   30 mg at 03/23/22 0910   morphine (PF) 2 MG/ML injection 2 mg  2 mg Intravenous Q3H PRN Virl Cagey, MD       oxyCODONE (Oxy IR/ROXICODONE) immediate release tablet 5 mg  5 mg  Oral Q4H PRN Virl Cagey, MD       polyethylene glycol (MIRALAX / GLYCOLAX) packet 17 g  17 g Oral BID Virl Cagey, MD   17 g at 03/23/22 [id number]   prochlorperazine (COMPAZINE) injection 10 mg  10 mg Intravenous Q6H PRN Virl Cagey, MD       senna-docusate (Senokot-S) tablet 2 tablet  2 tablet Oral BID Virl Cagey, MD   2 tablet at 03/23/22 [id number]     Discharge Medications: Please see discharge summary for a list of discharge medications.  Relevant Imaging Results:  Relevant Lab Results:   Additional Information SSN: [id number] Clydene Pugh, LCSW

## 2022-03-23 NOTE — Progress Notes (Signed)
Report given to [facility]. Daughter at bedside and updated. Pt resting in chair. Nad.

## 2022-03-23 NOTE — Progress Notes (Signed)
PROGRESS NOTE    DEDRICK [name]  [id number] DOB: 04/20/1927 DOA: 03/19/2022 PCP: Bernerd Limbo, MD    Brief Narrative:   Brittany [name] is a 89 y.o. female with past medical history significant for HTN, HLD, CHB s/p PPM, type 2 diabetes mellitus, chronic diastolic congestive heart failure, atrial flutter on Eliquis who presented to [facility] ED on 3/10 via EMS from home complaining of abdominal pain surrounding her known hernia.  Patient's hernia has been more protrusive causing pain and usually will be able to self reduce when laying flat; but now unable to reduce.  Patient also utilizes a belly binder to prevent further protrusion of the hernia.  In the ED, temperature 97.9 F, HR 79, RR 16, BP 131/67, SpO2 97% on room air.  WBC 6.5, hemoglobin 12.1, platelets 266.  Sodium 135, potassium 3.1, chloride 104, CO2 25, glucose 137, BUN 10, creatinine 0.69.  AST 22, ALT 18, total bilirubin 0.8.  Lactic acid 2.2.  CT abdomen/pelvis with contrast with incarcerated right inguinal hernia with cecal closed-loop obstruction, ileocecal valve and base of appendix also located within the hernia with findings concerning for ischemia; colonic diverticulosis without acute diverticulitis, cholelithiasis without evidence of acute cholecystitis, multiple fluid density pancreatic lesions measuring up to 2.6 cm likely representative of intraductal papillary mucinous neoplasm, aortic atherosclerosis.  General surgery was consulted and right incarcerated inguinal hernia was reduced.  Repeat CT pelvis with decreased size of the right inguinal hernia which contains fat and fluid, no bowel loops are present in the right inguinal hernia with decreased distention and wall thickening of the cecum after reduction, ileocecal valve/appendix no longer within the hernia.  TRH was consulted for admission for planned operative management of right inguinal hernia.  Assessment & Plan:   Incarcerated right inguinal  hernia Patient presenting to ED with abdominal pain with protrusion of her known right inguinal hernia.  Was found on imaging to have a incarcerated right inguinal hernia with cecal obstruction and ileocecal valve/appendix located within the hernia.  General surgery was consulted and hernia was reduced at bedside.  Given incarcerated hernia, general surgery recommended hernia repair to prevent further events in the future.  Patient underwent right inguinal hernia repair with mesh by general surgery, Dr. Constance Haw on 03/22/2022. -- General surgery following, appreciate assistance -- Holding Eliquis; defer to general surgery on when to restart  Pancreatic lesions CT abdomen/pelvis with contrast showed multiple fluid filled density pancreatic lesions measuring up to 2.6 cm, likely representative of intraductal papillary mucinous neoplasms.  Outpatient follow-up with PCP, consideration of dedicated MRI pancreatic protocol for further evaluation.  Chronic diastolic congestive heart failure, compensated Essential hypertension TTE 03/20/2022 with LVEF 60 to 65%, no LV regional wall motion normalities, mild LVH, LA severely dilated, trivial MR, borderline dilation aortic root measuring 37 mm, IVC dilated; no change from prior study. -- Irbesartan 37.5 mg p.o. daily -- Furosemide 40 mg p.o. daily  Complete heart block s/p PPM Patient has a Chemical engineer PPM in place, device approaching ERI roughly 4.3 months.  Outpatient follow-up with cardiology.  CAD Currently on Eliquis, not on aspirin.  Type 2 diabetes mellitus On metformin 500 mg p.o. daily at home.  Atrial flutter -- Holding Eliquis as above    DVT prophylaxis: Place and maintain sequential compression device Start: 03/20/22 1745 SCDs Start: 03/19/22 2210    Code Status: DNR Family Communication: No family present at bedside this morning, attempted to update patient's daughter Lemmie Evens via telephone unsuccessful and  voicemail  left  Disposition Plan:  Level of care: Telemetry Status is: Inpatient Remains inpatient appropriate because: Awaiting return of bowel function, sign off from general surgery, pending PT evaluation, anticipate discharge home likely tomorrow    Consultants:  General surgery, Dr. Constance Haw  Procedures:  Right inguinal hernia repair with mesh, Dr. Constance Haw 3/13  Antimicrobials:  Perioperative cefotetan   Subjective: Patient seen examined bedside, resting comfortably.  Lying in bed.  Rectus.  Asking for help cutting up her [place] toast.  Complains of some mild discomfort at right lower abdomen surgical site.  Otherwise no other complaints, concerns or questions this morning.  No family present at bedside, attempted to update patient's daughter Lemmie Evens via telephone no answer with voicemail left.  Denies headache, no dizziness, no chest pain, no palpitations, no shortness of breath, no current abdominal pain, no fever/chills/night sweats, no nausea/vomiting/diarrhea.  No acute events overnight per nursing staff.  Objective: Vitals:   03/22/22 2020 03/23/22 0319 03/23/22 0400 03/23/22 0909  BP: (!) 110/45  120/73 (!) 129/53  Pulse: 72  74 72  Resp: 17  16   Temp: 97.7 F (36.5 C)  98.2 F (36.8 C)   TempSrc: Oral  Oral   SpO2: 99%  99%   Weight:  67.8 kg    Height:        Intake/Output Summary (Last 24 hours) at 03/23/2022 1037 Last data filed at 03/23/2022 0500 Gross per 24 hour  Intake 1020 ml  Output 5 ml  Net 1015 ml   Filed Weights   03/22/22 0350 03/22/22 1002 03/23/22 0319  Weight: 70.3 kg 70.3 kg 67.8 kg    Examination:  Physical Exam: GEN: NAD, alert, elderly in appearance HEENT: NCAT, PERRL, EOMI, sclera clear, MMM PULM: CTAB w/o wheezes/crackles, normal respiratory effort, on room air CV: RRR w/o M/G/R GI: abd soft, NTND, NABS, noted right lower quadrant surgical incision site well-approximated with Dermabond, no surrounding fluctuance, erythema, or drainage MSK:  no peripheral edema, moves all extremity independently NEURO: CN II-XII intact, no focal deficits, sensation to light touch intact PSYCH: normal mood/affect Integumentary: Abdominal surgical site as above, otherwise no concerning rashes/lesions/wounds on exposed skin surfaces.      Data Reviewed: I have personally reviewed following labs and imaging studies  CBC: Recent Labs  Lab 03/19/22 1745 03/20/22 0435 03/21/22 0900 03/22/22 0457 03/23/22 0505  WBC 6.5 6.1 6.0 8.0 10.2  NEUTROABS 3.9  --   --  5.6  --   HGB 12.1 10.8* 11.0* 11.3* 12.2  HCT 36.8 33.4* 33.0* 34.1* 37.3  MCV 90.4 92.5 90.7 91.2 91.4  PLT 266 229 187 168 [id number]   Basic Metabolic Panel: Recent Labs  Lab 03/19/22 1745 03/20/22 0435 03/21/22 0900 03/22/22 0457 03/23/22 0505  NA 135 139 137 138 135  K 3.1* 4.0 3.6 4.0 3.4*  CL 104 107 108 106 103  CO2 '25 27 24 24 25  '$ GLUCOSE 137* 84 93 104* 137*  BUN 10 10 6* 7* 12  CREATININE 0.69 0.62 0.57 0.68 0.79  CALCIUM 9.8 9.4 9.5 9.7 10.0  MG  --  2.0 1.9 1.9  --   PHOS  --  2.8 2.3* 2.1*  --    GFR: Estimated Creatinine Clearance: 36.8 mL/min (by C-G formula based on SCr of 0.79 mg/dL). Liver Function Tests: Recent Labs  Lab 03/19/22 1745 03/20/22 0435 03/22/22 0457  AST '22 15 15  '$ ALT '18 14 11  '$ ALKPHOS 64 55 54  BILITOT 0.8 0.8  1.0  PROT 7.1 5.9* 6.0*  ALBUMIN 3.7 3.2* 3.0*   No results for input(s): "LIPASE", "AMYLASE" in the last 168 hours. No results for input(s): "AMMONIA" in the last 168 hours. Coagulation Profile: No results for input(s): "INR", "PROTIME" in the last 168 hours. Cardiac Enzymes: No results for input(s): "CKTOTAL", "CKMB", "CKMBINDEX", "TROPONINI" in the last 168 hours. BNP (last 3 results) No results for input(s): "PROBNP" in the last 8760 hours. HbA1C: No results for input(s): "HGBA1C" in the last 72 hours. CBG: Recent Labs  Lab 03/22/22 2309 03/23/22 0323 03/23/22 0352 03/23/22 0450 03/23/22 0738  GLUCAP 134* 63*  64* 102* 117*   Lipid Profile: No results for input(s): "CHOL", "HDL", "LDLCALC", "TRIG", "CHOLHDL", "LDLDIRECT" in the last 72 hours. Thyroid Function Tests: No results for input(s): "TSH", "T4TOTAL", "FREET4", "T3FREE", "THYROIDAB" in the last 72 hours. Anemia Panel: No results for input(s): "VITAMINB12", "FOLATE", "FERRITIN", "TIBC", "IRON", "RETICCTPCT" in the last 72 hours. Sepsis Labs: Recent Labs  Lab 03/19/22 1745 03/19/22 2055  LATICACIDVEN 2.2* 1.5    Recent Results (from the past 240 hour(s))  Surgical pcr screen     Status: None   Collection Time: 03/21/22  7:00 PM   Specimen: Nasal Mucosa; Nasal Swab  Result Value Ref Range Status   MRSA, PCR NEGATIVE NEGATIVE Final   Staphylococcus aureus NEGATIVE NEGATIVE Final    Comment: (NOTE) The Xpert SA Assay (FDA approved for NASAL specimens in patients 46 years of age and older), is one component of a comprehensive surveillance program. It is not intended to diagnose infection nor to guide or monitor treatment. Performed at [facility], [address] [place],  [address]          Radiology Studies: No results found.      Scheduled Meds:  furosemide  40 mg Oral Daily   insulin aspart  0-9 Units Subcutaneous Q4H   irbesartan  37.5 mg Oral Daily   isosorbide mononitrate  30 mg Oral Daily   polyethylene glycol  17 g Oral BID   potassium chloride  30 mEq Oral Q3H   senna-docusate  2 tablet Oral BID   Continuous Infusions:   LOS: 4 days    Time spent: 48 minutes spent on chart review, discussion with nursing staff, consultants, updating family and interview/physical exam; more than 50% of that time was spent in counseling and/or coordination of care.    Norell Brisbin J [place] DO [facility] Available via Epic secure chat 7am-7pm After these hours, please refer to coverage provider listed on [email or web address] 03/23/2022, 10:37 AM

## 2022-03-23 NOTE — TOC Initial Note (Signed)
Transition of Care [facility]) - Initial/Assessment Note    Patient Details  Name: Brittany [name] MRN: [id number] Date of Birth: [birth year]  [facility] CM/SW Contact:    Ihor Gully, LCSW Phone Number: 03/23/2022, 6:58 PM  Clinical Narrative:                 Patient from home with daugher, Lemmie Evens. Admitted for incarcerated right inguinal hernia. PT recommends SNF. Daughter is agreeable to SNF at discharge. At baseline, patient uses a walker. Daughter assists with bathing. She is ambulatory throughout the home with a RW without issue. Discussed SNF choices. Referred to facility of choice.   Expected Discharge Plan: Skilled Nursing Facility Barriers to Discharge: Continued Medical Work up   Patient Goals and CMS Choice Patient states their goals for this hospitalization and ongoing recovery are:: rehab then home          Expected Discharge Plan and Services       Living arrangements for the past 2 months: Single Family Home                                      Prior Living Arrangements/Services Living arrangements for the past 2 months: Single Family Home Lives with:: Adult Children Patient language and need for interpreter reviewed:: Yes        Need for Family Participation in Patient Care: Yes (Comment) Care giver support system in place?: Yes (comment) Current home services: DME (walker, motorized bed, bsc) Criminal Activity/Legal Involvement Pertinent to Current Situation/Hospitalization: No - Comment as needed  Activities of Daily Living Home Assistive Devices/Equipment: Other (Comment) ADL Screening (condition at time of admission) Patient's cognitive ability adequate to safely complete daily activities?: No Is the patient deaf or have difficulty hearing?: No Does the patient have difficulty seeing, even when wearing glasses/contacts?: No Does the patient have difficulty concentrating, remembering, or making decisions?: Yes Patient able to  express need for assistance with ADLs?: No Does the patient have difficulty dressing or bathing?: Yes Independently performs ADLs?: No Communication: Needs assistance Is this a change from baseline?: Change from baseline, expected to last <3 days Dressing (OT): Needs assistance Is this a change from baseline?: Change from baseline, expected to last <3days Grooming: Needs assistance Feeding: Needs assistance Bathing: Needs assistance Is this a change from baseline?: Change from baseline, expected to last <3 days Toileting: Needs assistance Is this a change from baseline?: Change from baseline, expected to last <3 days In/Out Bed: Needs assistance Is this a change from baseline?: Change from baseline, expected to last <3 days Walks in Home: Needs assistance Is this a change from baseline?: Change from baseline, expected to last <3 days Does the patient have difficulty walking or climbing stairs?: Yes Weakness of Legs: Both Weakness of Arms/Hands: Both  Permission Sought/Granted Permission sought to share information with : Family Supports    Share Information with NAME: daughter, Amie Critchley           Emotional Assessment     Affect (typically observed): Appropriate Orientation: : Oriented to Self Alcohol / Substance Use: Not Applicable Psych Involvement: No (comment)  Admission diagnosis:  Incarcerated right inguinal hernia [K40.30] Inguinal hernia of right side with obstruction [K40.30] Patient Active Problem List   Diagnosis Date Noted   Preop cardiovascular exam 03/21/2022   Incarcerated right inguinal hernia 03/19/2022   Lactic acidosis 03/19/2022   LBBB (left  bundle branch block) 03/19/2022   Type 2 diabetes mellitus with hyperglycemia ([place]) 03/19/2022   Constipation 03/19/2022   Obesity (BMI 30-39.9) 03/19/2022   Prolonged QT interval 123456   Acute metabolic encephalopathy 0000000   Hypokalemia 02/28/2022   Leg swelling 06/22/2020   Atrial flutter ([place])  05/06/2020   CHF (congestive heart failure) ([place]) 01/02/2015   Chronic diastolic CHF (congestive heart failure) ([place]) 01/02/2015   DM2 (diabetes mellitus, type 2) ([place]) 01/02/2015   Belching 10/22/2012   SOB (shortness of breath) 10/22/2012   Lower extremity numbness 06/20/2012   Closed left hip fracture ([place]) 01/18/2012   HYPERSOMNIA UNSPECIFIED 06/23/2008   EDEMA 06/23/2008   GLAUCOMA 05/27/2008   LAD-balloon angioplasty/cutting balloon-2007 05/27/2008   AV BLOCK, COMPLETE 05/27/2008   Pacemaker 05/27/2008   HYPERLIPIDEMIA 11/05/2006   Essential hypertension 11/05/2006   PCP:  Bernerd Limbo, MD Pharmacy:   [facility] [place] [address] Phone: [phone] Fax: [phone]     Social Determinants of Health (SDOH) Social History: SDOH Screenings   Food Insecurity: No Food Insecurity (02/28/2022)  Housing: Low Risk  (02/28/2022)  Transportation Needs: No Transportation Needs (02/28/2022)  Utilities: Not At Risk (02/28/2022)  Tobacco Use: Medium Risk (03/22/2022)   SDOH Interventions:     Readmission Risk Interventions    03/02/2022   11:30 AM  Readmission Risk Prevention Plan  Post Dischage Appt Not Complete  Medication Screening Complete  Transportation Screening Complete

## 2022-03-24 DIAGNOSIS — K4030 Unilateral inguinal hernia, with obstruction, without gangrene, not specified as recurrent: Secondary | ICD-10-CM | POA: Diagnosis not present

## 2022-03-24 LAB — GLUCOSE, CAPILLARY
Glucose-Capillary: 103 mg/dL — ABNORMAL HIGH (ref 70–99)
Glucose-Capillary: 144 mg/dL — ABNORMAL HIGH (ref 70–99)
Glucose-Capillary: 87 mg/dL (ref 70–99)
Glucose-Capillary: 97 mg/dL (ref 70–99)

## 2022-03-24 MED ORDER — POLYETHYLENE GLYCOL 3350 17 G PO PACK: 17.0000 g | PACK | Freq: Every day | ORAL | 0 refills | Status: DC | PRN

## 2022-03-24 MED ORDER — ZINC OXIDE 40 % EX OINT
TOPICAL_OINTMENT | Freq: Two times a day (BID) | CUTANEOUS | Status: DC
  Filled 2022-03-24: qty 57

## 2022-03-24 MED ORDER — ZINC OXIDE 40 % EX OINT: TOPICAL_OINTMENT | Freq: Two times a day (BID) | CUTANEOUS | 0 refills | Status: DC

## 2022-03-24 MED ORDER — OXYCODONE HCL 5 MG PO TABS: 5.0000 mg | ORAL_TABLET | Freq: Four times a day (QID) | ORAL | 0 refills | Status: DC | PRN

## 2022-03-24 NOTE — TOC Transition Note (Signed)
Transition of Care [facility]) - CM/SW Discharge Note   Patient Details  Name: Brittany [name] MRN: [id number] Date of Birth: [birth year]  Transition of Care [facility]) CM/SW Contact:  Iona Beard, [place] Phone Number: 03/24/2022, 1:41 PM  Clinical Narrative:    CSW updated that D/C has been completed. CSW sent D/C clinicals over to facility. CSW updated pts daughter of plan for D/C. Pts daughter will transport pt to facility. TOC signing off.    Final next level of care: Skilled Nursing Facility Barriers to Discharge: Barriers Resolved   Patient Goals and CMS Choice CMS Medicare.gov Compare Post Acute Care list provided to:: Patient Represenative (must comment) Choice offered to / list presented to : Patient, Adult Children  Discharge Placement                Patient chooses bed at: [facility] Patient to be transferred to facility by: Family Name of family member notified: Daughter Patient and family notified of of transfer: 03/24/22  Discharge Plan and Services Additional resources added to the After Visit Summary for                                       Social Determinants of Health (SDOH) Interventions SDOH Screenings   Food Insecurity: No Food Insecurity (02/28/2022)  Housing: Low Risk  (02/28/2022)  Transportation Needs: No Transportation Needs (02/28/2022)  Utilities: Not At Risk (02/28/2022)  Tobacco Use: Medium Risk (03/22/2022)     Readmission Risk Interventions    03/02/2022   11:30 AM  Readmission Risk Prevention Plan  Post Dischage Appt Not Complete  Medication Screening Complete  Transportation Screening Complete

## 2022-03-24 NOTE — TOC Progression Note (Signed)
Transition of Care [facility]) - Progression Note    Patient Details  Name: Brittany [name] MRN: [id number] Date of Birth: 1927-09-28  Transition of Care [facility]) CM/SW [place], [place] Phone Number: 03/24/2022, 10:33 AM  Clinical Narrative:    CSW spoke to [place] with [facility] who states they can offer a bed for pt for SNF. CSW updated pts daughter of this, she accepts bed offer at [facility]. CSW updated insurance auth to reflect bed choice. TOC to follow.   Expected Discharge Plan: [place] Barriers to Discharge: Continued Medical Work up  Expected Discharge Plan and Services       Living arrangements for the past 2 months: Single Family Home                                       Social Determinants of Health (SDOH) Interventions SDOH Screenings   Food Insecurity: No Food Insecurity (02/28/2022)  Housing: Low Risk  (02/28/2022)  Transportation Needs: No Transportation Needs (02/28/2022)  Utilities: Not At Risk (02/28/2022)  Tobacco Use: Medium Risk (03/22/2022)    Readmission Risk Interventions    03/02/2022   11:30 AM  Readmission Risk Prevention Plan  Post Dischage Appt Not Complete  Medication Screening Complete  Transportation Screening Complete

## 2022-03-24 NOTE — Progress Notes (Signed)
PROGRESS NOTE    Brittany [name]  [id number] DOB: [birth year] DOA: 03/19/2022 PCP: Bernerd Limbo, MD    Brief Narrative:   Brittany [name] is a 89 y.o. female with past medical history significant for HTN, HLD, CHB s/p PPM, type 2 diabetes mellitus, chronic diastolic congestive heart failure, atrial flutter on Eliquis who presented to [facility] ED on 3/10 via EMS from home complaining of abdominal pain surrounding her known hernia.  Patient's hernia has been more protrusive causing pain and usually will be able to self reduce when laying flat; but now unable to reduce.  Patient also utilizes a belly binder to prevent further protrusion of the hernia.  In the ED, temperature 97.9 F, HR 79, RR 16, BP 131/67, SpO2 97% on room air.  WBC 6.5, hemoglobin 12.1, platelets 266.  Sodium 135, potassium 3.1, chloride 104, CO2 25, glucose 137, BUN 10, creatinine 0.69.  AST 22, ALT 18, total bilirubin 0.8.  Lactic acid 2.2.  CT abdomen/pelvis with contrast with incarcerated right inguinal hernia with cecal closed-loop obstruction, ileocecal valve and base of appendix also located within the hernia with findings concerning for ischemia; colonic diverticulosis without acute diverticulitis, cholelithiasis without evidence of acute cholecystitis, multiple fluid density pancreatic lesions measuring up to 2.6 cm likely representative of intraductal papillary mucinous neoplasm, aortic atherosclerosis.  General surgery was consulted and right incarcerated inguinal hernia was reduced.  Repeat CT pelvis with decreased size of the right inguinal hernia which contains fat and fluid, no bowel loops are present in the right inguinal hernia with decreased distention and wall thickening of the cecum after reduction, ileocecal valve/appendix no longer within the hernia.  TRH was consulted for admission for planned operative management of right inguinal hernia.  Assessment & Plan:   Incarcerated right inguinal  hernia Patient presenting to ED with abdominal pain with protrusion of her known right inguinal hernia.  Was found on imaging to have a incarcerated right inguinal hernia with cecal obstruction and ileocecal valve/appendix located within the hernia.  General surgery was consulted and hernia was reduced at bedside.  Given incarcerated hernia, general surgery recommended hernia repair to prevent further events in the future.  Patient underwent right inguinal hernia repair with mesh by general surgery, Dr. Constance Haw on 03/22/2022.  Pancreatic lesions CT abdomen/pelvis with contrast showed multiple fluid filled density pancreatic lesions measuring up to 2.6 cm, likely representative of intraductal papillary mucinous neoplasms.  Outpatient follow-up with PCP, consideration of dedicated MRI pancreatic protocol for further evaluation.  Chronic diastolic congestive heart failure, compensated Essential hypertension TTE 03/20/2022 with LVEF 60 to 65%, no LV regional wall motion normalities, mild LVH, LA severely dilated, trivial MR, borderline dilation aortic root measuring 37 mm, IVC dilated; no change from prior study. -- Irbesartan 37.5 mg p.o. daily -- Isosorbide mononitrate 30 mg p.o. daily -- Furosemide 40 mg p.o. daily  Complete heart block s/p PPM Patient has a Chemical engineer PPM in place, device approaching ERI roughly 4.3 months.  Outpatient follow-up with cardiology.  CAD Continue Eliquis  Type 2 diabetes mellitus On metformin 500 mg p.o. daily at home. -- Hold oral hypoglycemics while inpatient -- SSI for coverage  Atrial flutter -- Eliquis 5 mg p.o. twice daily    DVT prophylaxis: Place and maintain sequential compression device Start: 03/20/22 1745 SCDs Start: 03/19/22 2210 apixaban (ELIQUIS) tablet 5 mg    Code Status: DNR Family Communication: No family present at bedside this morning, attempted to update patient's daughter Brittany [name] via  telephone unsuccessful and voicemail  left  Disposition Plan:  Level of care: Telemetry Status is: Inpatient Remains inpatient appropriate because: Pending SNF placement    Consultants:  General surgery, Dr. Constance Haw  Procedures:  Right inguinal hernia repair with mesh, Dr. Constance Haw 3/13  Antimicrobials:  Perioperative cefotetan   Subjective: Patient seen examined bedside, resting comfortably.  Lying in bed.  Eating breakfast.  Pleasantly confused.  States "if wishes were horses, beggars would ride".  No other specific complaints.  One bowel movement reported last 24 hours.  No family present at bedside.  Denies headache, no dizziness, no chest pain, no shortness of breath, no abdominal pain, no fever/chills, no nausea/vomiting/diarrhea.  No acute events overnight per nursing staff.  Objective: Vitals:   03/23/22 0400 03/23/22 0909 03/23/22 2016 03/24/22 0500  BP: 120/73 (!) 129/53 (!) 127/53   Pulse: 74 72 73   Resp: 16  18   Temp: 98.2 F (36.8 C)  99.5 F (37.5 C)   TempSrc: Oral  Oral   SpO2: 99%  96%   Weight:    68 kg  Height:        Intake/Output Summary (Last 24 hours) at 03/24/2022 0950 Last data filed at 03/24/2022 0900 Gross per 24 hour  Intake 600 ml  Output 250 ml  Net 350 ml   Filed Weights   03/22/22 1002 03/23/22 0319 03/24/22 0500  Weight: 70.3 kg 67.8 kg 68 kg    Examination:  Physical Exam: GEN: NAD, alert, wanted to self, pleasantly confused, elderly in appearance HEENT: NCAT, PERRL, EOMI, sclera clear, MMM PULM: CTAB w/o wheezes/crackles, normal respiratory effort, on room air CV: RRR w/o M/G/R GI: abd soft, NTND, NABS, noted right lower quadrant surgical incision site well-approximated with Dermabond, no surrounding fluctuance, erythema, or drainage MSK: no peripheral edema, moves all extremity independently NEURO: CN II-XII intact, no focal deficits, sensation to light touch intact PSYCH: normal mood/affect Integumentary: Abdominal surgical site as above, otherwise no  concerning rashes/lesions/wounds on exposed skin surfaces.      Data Reviewed: I have personally reviewed following labs and imaging studies  CBC: Recent Labs  Lab 03/19/22 1745 03/20/22 0435 03/21/22 0900 03/22/22 0457 03/23/22 0505  WBC 6.5 6.1 6.0 8.0 10.2  NEUTROABS 3.9  --   --  5.6  --   HGB 12.1 10.8* 11.0* 11.3* 12.2  HCT 36.8 33.4* 33.0* 34.1* 37.3  MCV 90.4 92.5 90.7 91.2 91.4  PLT 266 229 187 168 [id number]   Basic Metabolic Panel: Recent Labs  Lab 03/19/22 1745 03/20/22 0435 03/21/22 0900 03/22/22 0457 03/23/22 0505  NA 135 139 137 138 135  K 3.1* 4.0 3.6 4.0 3.4*  CL 104 107 108 106 103  CO2 25 27 24 24 25   GLUCOSE 137* 84 93 104* 137*  BUN 10 10 6* 7* 12  CREATININE 0.69 0.62 0.57 0.68 0.79  CALCIUM 9.8 9.4 9.5 9.7 10.0  MG  --  2.0 1.9 1.9  --   PHOS  --  2.8 2.3* 2.1*  --    GFR: Estimated Creatinine Clearance: 36.8 mL/min (by C-G formula based on SCr of 0.79 mg/dL). Liver Function Tests: Recent Labs  Lab 03/19/22 1745 03/20/22 0435 03/22/22 0457  AST 22 15 15   ALT 18 14 11   ALKPHOS 64 55 54  BILITOT 0.8 0.8 1.0  PROT 7.1 5.9* 6.0*  ALBUMIN 3.7 3.2* 3.0*   No results for input(s): "LIPASE", "AMYLASE" in the last 168 hours. No results for input(s): "AMMONIA"  in the last 168 hours. Coagulation Profile: No results for input(s): "INR", "PROTIME" in the last 168 hours. Cardiac Enzymes: No results for input(s): "CKTOTAL", "CKMB", "CKMBINDEX", "TROPONINI" in the last 168 hours. BNP (last 3 results) No results for input(s): "PROBNP" in the last 8760 hours. HbA1C: No results for input(s): "HGBA1C" in the last 72 hours. CBG: Recent Labs  Lab 03/23/22 1656 03/23/22 2037 03/24/22 0008 03/24/22 0335 03/24/22 0723  GLUCAP 112* 135* 97 103* 87   Lipid Profile: No results for input(s): "CHOL", "HDL", "LDLCALC", "TRIG", "CHOLHDL", "LDLDIRECT" in the last 72 hours. Thyroid Function Tests: No results for input(s): "TSH", "T4TOTAL", "FREET4",  "T3FREE", "THYROIDAB" in the last 72 hours. Anemia Panel: No results for input(s): "VITAMINB12", "FOLATE", "FERRITIN", "TIBC", "IRON", "RETICCTPCT" in the last 72 hours. Sepsis Labs: Recent Labs  Lab 03/19/22 1745 03/19/22 2055  LATICACIDVEN 2.2* 1.5    Recent Results (from the past 240 hour(s))  Surgical pcr screen     Status: None   Collection Time: 03/21/22  7:00 PM   Specimen: Nasal Mucosa; Nasal Swab  Result Value Ref Range Status   MRSA, PCR NEGATIVE NEGATIVE Final   Staphylococcus aureus NEGATIVE NEGATIVE Final    Comment: (NOTE) The Xpert SA Assay (FDA approved for NASAL specimens in patients 39 years of age and older), is one component of a comprehensive surveillance program. It is not intended to diagnose infection nor to guide or monitor treatment. Performed at [facility], [address], [place], [place] [address]          Radiology Studies: No results found.      Scheduled Meds:  apixaban  5 mg Oral BID   furosemide  40 mg Oral Daily   insulin aspart  0-9 Units Subcutaneous Q4H   irbesartan  37.5 mg Oral Daily   isosorbide mononitrate  30 mg Oral Daily   liver oil-zinc oxide   Topical BID   polyethylene glycol  17 g Oral BID   senna-docusate  2 tablet Oral BID   Continuous Infusions:   LOS: 5 days    Time spent: 48 minutes spent on chart review, discussion with nursing staff, consultants, updating family and interview/physical exam; more than 50% of that time was spent in counseling and/or coordination of care.    Tallia Moehring J [place] DO [facility] Available via Epic secure chat 7am-7pm After these hours, please refer to coverage provider listed on [email or web address] 03/24/2022, 9:50 AM

## 2022-03-24 NOTE — Evaluation (Signed)
Occupational Therapy Evaluation Patient Details Name: Brittany [name] MRN: [id number] DOB: 1927/04/11 Today's Date: 03/24/2022   History of Present Illness Brittany [name] is a 89 y.o. female s/p Right inguinal hernia repair with mesh on 03/22/22, with medical history significant of hypertension, hyperlipidemia, history of atrial flutter, complete heart block status post pacemaker implantation, type 2 diabetes mellitus, chronic diastolic heart failure who presented to the emergency department from home via EMS due to pain in the right groin area.  Patient has a history of right inguinal hernia which has been ongoing for about the ED and was usually reducible when she lays down in the past, she weighs a belly binder which helps with prevention of protrusion of the hernia.  However, she presented with right groin pain this morning with difficulty in being able to reduce the hernia.  EMS was activated and patient was taken to the ED for further evaluation and management.  Patient has had a bowel movement this morning, she took Eliquis this morning and she denies chest pain, shortness of breath, nausea, vomiting.   Clinical Impression   Pt agreeable to OT evaluation. PT's daughter entered room during the session. Pt required min to mod A for bed mobility. Mod A to stand and ambulate to a few steps forward and backward. Pt is generally weak in B UE. Pt was left in the bed with bed alarm set and family present. Pt will benefit from continued OT in the hospital and recommended venue below to increase strength, balance, and endurance for safe ADL's.        Recommendations for follow up therapy are one component of a multi-disciplinary discharge planning process, led by the attending physician.  Recommendations may be updated based on patient status, additional functional criteria and insurance authorization.   Follow Up Recommendations  Skilled nursing-short term rehab (<3 hours/day)     Assistance  Recommended at Discharge Frequent or constant Supervision/Assistance  Patient can return home with the following A lot of help with walking and/or transfers;A lot of help with bathing/dressing/bathroom;Assistance with cooking/housework;Assist for transportation;Help with stairs or ramp for entrance;Direct supervision/assist for medications management    Functional Status Assessment  Patient has had a recent decline in their functional status and demonstrates the ability to make significant improvements in function in a reasonable and predictable amount of time.  Equipment Recommendations  None recommended by OT    Recommendations for Other Services       Precautions / Restrictions Precautions Precautions: Fall Restrictions Weight Bearing Restrictions: No (Simultaneous filing. User may not have seen previous data.)      Mobility Bed Mobility Overal bed mobility: Needs Assistance Bed Mobility: Supine to Sit     Supine to sit: Mod assist, Min assist, HOB elevated     General bed mobility comments: increased time, labored movement ; hand held assist to pull to EOB.    Transfers Overall transfer level: Needs assistance Equipment used: Rolling walker (2 wheels) Transfers: Sit to/from Stand, Bed to chair/wheelchair/BSC Sit to Stand: Mod assist     Step pivot transfers: Mod assist     General transfer comment: unsteady labored movement; difficulty mobilizing R LE at times.      Balance Overall balance assessment: Needs assistance Sitting-balance support: Feet supported, No upper extremity supported Sitting balance-Leahy Scale: Fair Sitting balance - Comments: fair/good seated at EOB   Standing balance support: Reliant on assistive device for balance, During functional activity, Bilateral upper extremity supported Standing balance-Leahy Scale: Poor Standing  balance comment: fair/poor using RW                           ADL either performed or assessed with  clinical judgement   ADL Overall ADL's : Needs assistance/impaired     Grooming: Minimal assistance;Moderate assistance;Sitting   Upper Body Bathing: Minimal assistance;Moderate assistance   Lower Body Bathing: Maximal assistance;Sitting/lateral leans   Upper Body Dressing : Minimal assistance;Moderate assistance;Sitting   Lower Body Dressing: Maximal assistance;Sitting/lateral leans   Toilet Transfer: Moderate assistance;Ambulation;Rolling walker (2 wheels) Toilet Transfer Details (indicate cue type and reason): Simulted via 3 steps away from EOB using RW. Toileting- Clothing Manipulation and Hygiene: Maximal assistance;Total assistance;Sitting/lateral lean       Functional mobility during ADLs: Moderate assistance;Rolling walker (2 wheels)       Vision Baseline Vision/History: 1 Wears glasses Ability to See in Adequate Light: 0 Adequate Patient Visual Report: No change from baseline Vision Assessment?: No apparent visual deficits                Pertinent Vitals/Pain Pain Assessment Pain Assessment: No/denies pain     Hand Dominance Right   Extremity/Trunk Assessment Upper Extremity Assessment Upper Extremity Assessment: Generalized weakness (Limited to ~75% of available rang for shoulder flexion A/ROM bilaterally. Near full P/ROM of shoulder flexion.)   Lower Extremity Assessment Lower Extremity Assessment: Defer to PT evaluation   Cervical / Trunk Assessment Cervical / Trunk Assessment: Kyphotic   Communication Communication Communication: No difficulties   Cognition Arousal/Alertness: Awake/alert Behavior During Therapy: WFL for tasks assessed/performed, Anxious Overall Cognitive Status: Within Functional Limits for tasks assessed                                                        Home Living Family/patient expects to be discharged to:: Private residence Living Arrangements: Children Available Help at Discharge:  Family;Available 24 hours/day Type of Home: House Home Access: Ramped entrance     Home Layout: Two level;Able to live on main level with bedroom/bathroom;Full bath on main level     Bathroom Shower/Tub: Occupational psychologist: Handicapped height Bathroom Accessibility: Yes   Home Equipment: Conservation officer, nature (2 wheels);BSC/3in1;Cane - quad;Shower seat   Additional Comments: Taken via PT note.      Prior Functioning/Environment Prior Level of Function : Needs assist       Physical Assist : Mobility (physical);ADLs (physical) Mobility (physical): Bed mobility;Transfers;Gait;Stairs ADLs (physical): Grooming;Bathing;Dressing;Toileting;IADLs Mobility Comments: Scientist, research (life sciences) RW ADLs Comments: assisted by family, rarely require help for sitting up at bedside        OT Problem List: Decreased strength;Decreased range of motion;Decreased activity tolerance;Impaired balance (sitting and/or standing)      OT Treatment/Interventions: Self-care/ADL training;Therapeutic exercise;Therapeutic activities;Patient/family education;Balance training    OT Goals(Current goals can be found in the care plan section) Acute Rehab OT Goals Patient Stated Goal: Go to rehab. OT Goal Formulation: With patient/family Time For Goal Achievement: 04/07/22 Potential to Achieve Goals: Good  OT Frequency: Min 2X/week                                   End of Session Equipment Utilized During Treatment: Rolling walker (2 wheels)  Activity Tolerance:  Patient tolerated treatment well Patient left: in bed;with call bell/phone within reach;with bed alarm set;with family/visitor present  OT Visit Diagnosis: Unsteadiness on feet (R26.81);Other abnormalities of gait and mobility (R26.89);Muscle weakness (generalized) (M62.81)                Time: [id number] OT Time Calculation (min): 16 min Charges:  OT General Charges $OT Visit: 1 Visit OT Evaluation $OT Eval Low  Complexity: 1 Low  Riaan Toledo OT, MOT  Larey Seat 03/24/2022, 10:18 AM

## 2022-03-24 NOTE — Plan of Care (Signed)
  Problem: Acute Rehab OT Goals (only OT should resolve) Goal: Pt. Will Perform Grooming Flowsheets (Taken 03/24/2022 1021) Pt Will Perform Grooming:  with supervision  sitting Goal: Pt. Will Perform Upper Body Bathing Flowsheets (Taken 03/24/2022 1021) Pt Will Perform Upper Body Bathing:  with min guard assist  sitting Goal: Pt. Will Perform Upper Body Dressing Flowsheets (Taken 03/24/2022 1021) Pt Will Perform Upper Body Dressing:  with min guard assist  sitting Goal: Pt. Will Transfer To Toilet Flowsheets (Taken 03/24/2022 1021) Pt Will Transfer to Toilet:  with min guard assist  stand pivot transfer Goal: Pt/Caregiver Will Perform Home Exercise Program Flowsheets (Taken 03/24/2022 1021) Pt/caregiver will Perform Home Exercise Program:  Increased ROM  Increased strength  Both right and left upper extremity  With Supervision  Fumi Guadron OT, MOT

## 2022-03-24 NOTE — Discharge Summary (Signed)
Physician Discharge Summary  Brittany [name] [id number] DOB: 1927-12-02 DOA: 03/19/2022  PCP: Bernerd Limbo, MD  Admit date: 03/19/2022 Discharge date: 03/24/2022  Admitted From: Home Disposition: [facility]  Recommendations for Outpatient Follow-up:  Follow up with PCP in 1-2 weeks Follow-up with general surgery, Dr. Constance Haw on 04/19/2022 Follow-up with cardiology, Dr. Dellia Cloud on 05/12/2022  Discharge Condition: Stable CODE STATUS: DNR Diet recommendation: Heart healthy/consistent carbohydrate diet  History of present illness:  Brittany [name] is a 89 y.o. female with past medical history significant for HTN, HLD, CHB s/p PPM, type 2 diabetes mellitus, chronic diastolic congestive heart failure, atrial flutter on Eliquis who presented to [facility] ED on 3/10 via EMS from home complaining of abdominal pain surrounding her known hernia.  Patient's hernia has been more protrusive causing pain and usually will be able to self reduce when laying flat; but now unable to reduce.  Patient also utilizes a belly binder to prevent further protrusion of the hernia.   In the ED, temperature 97.9 F, HR 79, RR 16, BP 131/67, SpO2 97% on room air.  WBC 6.5, hemoglobin 12.1, platelets 266.  Sodium 135, potassium 3.1, chloride 104, CO2 25, glucose 137, BUN 10, creatinine 0.69.  AST 22, ALT 18, total bilirubin 0.8.  Lactic acid 2.2.  CT abdomen/pelvis with contrast with incarcerated right inguinal hernia with cecal closed-loop obstruction, ileocecal valve and base of appendix also located within the hernia with findings concerning for ischemia; colonic diverticulosis without acute diverticulitis, cholelithiasis without evidence of acute cholecystitis, multiple fluid density pancreatic lesions measuring up to 2.6 cm likely representative of intraductal papillary mucinous neoplasm, aortic atherosclerosis.  General surgery was consulted and right incarcerated inguinal hernia was reduced.  Repeat  CT pelvis with decreased size of the right inguinal hernia which contains fat and fluid, no bowel loops are present in the right inguinal hernia with decreased distention and wall thickening of the cecum after reduction, ileocecal valve/appendix no longer within the hernia.  TRH was consulted for admission for planned operative management of right inguinal hernia.  Hospital course:   Incarcerated right inguinal hernia Patient presenting to ED with abdominal pain with protrusion of her known right inguinal hernia.  Was found on imaging to have a incarcerated right inguinal hernia with cecal obstruction and ileocecal valve/appendix located within the hernia.  General surgery was consulted and hernia was reduced at bedside.  Given incarcerated hernia, general surgery recommended hernia repair to prevent further events in the future.  Patient underwent right inguinal hernia repair with mesh by general surgery, Dr. Constance Haw on 03/22/2022.   Pancreatic lesions CT abdomen/pelvis with contrast showed multiple fluid filled density pancreatic lesions measuring up to 2.6 cm, likely representative of intraductal papillary mucinous neoplasms.  Outpatient follow-up with PCP, consideration of dedicated MRI pancreatic protocol for further evaluation.   Chronic diastolic congestive heart failure, compensated Essential hypertension TTE 03/20/2022 with LVEF 60 to 65%, no LV regional wall motion normalities, mild LVH, LA severely dilated, trivial MR, borderline dilation aortic root measuring 37 mm, IVC dilated; no change from prior study.  Continue irbesartan 37.5 mg p.o. daily, Isosorbide mononitrate 30 mg p.o. daily, Furosemide 40 mg p.o. daily   Complete heart block s/p PPM Patient has a Chemical engineer PPM in place, device approaching ERI roughly 4.3 months.  Outpatient follow-up with cardiology.   CAD Continue Eliquis   Type 2 diabetes mellitus On metformin 500 mg p.o. daily at home.   Atrial  flutter Eliquis 5  mg p.o. twice daily  Dementia Supportive care, avoid/limit benzodiazepines/opiates  Discharge Diagnoses:  Principal Problem:   Incarcerated right inguinal hernia Active Problems:   Essential hypertension   Pacemaker   Chronic diastolic CHF (congestive heart failure) (HCC)   Atrial flutter (HCC)   Hypokalemia   Lactic acidosis   LBBB (left bundle branch block)   Type 2 diabetes mellitus with hyperglycemia (HCC)   Constipation   Obesity (BMI 30-39.9)   Prolonged QT interval   Preop cardiovascular exam    Discharge Instructions  Discharge Instructions     Call MD for:  difficulty breathing, headache or visual disturbances   Complete by: As directed    Call MD for:  extreme fatigue   Complete by: As directed    Call MD for:  persistant dizziness or light-headedness   Complete by: As directed    Call MD for:  persistant nausea and vomiting   Complete by: As directed    Call MD for:  severe uncontrolled pain   Complete by: As directed    Call MD for:  temperature >100.4   Complete by: As directed    Diet - low sodium heart healthy   Complete by: As directed    Increase activity slowly   Complete by: As directed       Allergies as of 03/24/2022       Reactions   Ciprofloxacin Other (See Comments)   Severe chest pains   Ace Inhibitors Other (See Comments)   REACTION: cough and sore throat   Cedax [ceftibuten] Other (See Comments)   Possibly nausea/vomiting, cyanosis, severe headaches   Lipitor [atorvastatin] Other (See Comments)   REACTION: Myalgia   Plavix [clopidogrel Bisulfate] Other (See Comments)   Possibly nausea/vomiting, cyanosis, severe headaches    Statins Other (See Comments)   Myalgias, cyanosis        Medication List     TAKE these medications    cyanocobalamin 1000 MCG tablet Take 1,000 mcg by mouth daily.   Eliquis 5 MG Tabs tablet Generic drug: apixaban TAKE 1 TABLET TWICE A DAY What changed: how much to take    furosemide 40 MG tablet Commonly known as: LASIX Take 1 tablet (40 mg total) by mouth daily.   irbesartan 75 MG tablet Commonly known as: AVAPRO Take 0.5 tablets (37.5 mg total) by mouth daily.   isosorbide mononitrate 30 MG 24 hr tablet Commonly known as: IMDUR Take 1 tablet by mouth daily.   liver oil-zinc oxide 40 % ointment Commonly known as: DESITIN Apply topically 2 (two) times daily.   metFORMIN 500 MG 24 hr tablet Commonly known as: GLUCOPHAGE-XR Take 500 mg by mouth daily with breakfast.   oxyCODONE 5 MG immediate release tablet Commonly known as: Oxy IR/ROXICODONE Take 1 tablet (5 mg total) by mouth every 6 (six) hours as needed for moderate pain.   polyethylene glycol 17 g packet Commonly known as: MIRALAX / GLYCOLAX Take 17 g by mouth daily as needed.   potassium chloride SA 20 MEQ tablet Commonly known as: KLOR-CON M Take 20 mEq by mouth daily.   senna-docusate 8.6-50 MG tablet Commonly known as: Senokot-S Take 2 tablets by mouth 2 (two) times daily.        Contact information for follow-up providers     Mallipeddi, Quenten Raven, MD Follow up on 05/12/2022.   Specialties: Cardiology, Internal Medicine Why: Cardiology Follow-up on 05/12/2022 at 1:00 PM. Contact information: [address] [address] [phone]  Virl Cagey, MD Follow up on 04/19/2022.   Specialty: General Surgery Why: hernia follow up Contact information: [address] [place] [address] [phone]         Bernerd Limbo, MD. Schedule an appointment as soon as possible for a visit in 1 week(s).   Specialty: Family Medicine Contact information: [address] [place] [address] [phone]              Contact information for after-discharge care     Destination     [facility] .   Service: Skilled Nursing Contact information: [place] [address] [phone]                     Allergies  Allergen Reactions   Ciprofloxacin Other (See Comments)    Severe chest pains   Ace Inhibitors Other (See Comments)    REACTION: cough and sore throat   Cedax [Ceftibuten] Other (See Comments)    Possibly nausea/vomiting, cyanosis, severe headaches   Lipitor [Atorvastatin] Other (See Comments)    REACTION: Myalgia   Plavix [Clopidogrel Bisulfate] Other (See Comments)    Possibly nausea/vomiting, cyanosis, severe headaches     Statins Other (See Comments)    Myalgias, cyanosis    Consultations: Cardiology General surgery   Procedures/Studies: ECHOCARDIOGRAM COMPLETE  Result Date: 03/20/2022    ECHOCARDIOGRAM REPORT   Patient Name:   CHRISSETTE BOOKWALTER Purifoy Date of Exam: 03/20/2022 Medical Rec #:  [id number]       Height:       59.0 in Accession #:    [id number]      Weight:       146.6 lb Date of Birth:  1927/01/25       BSA:          1.616 m Patient Age:    55 years        BP:           139/57 mmHg Patient Gender: F               HR:           76 bpm. Exam Location:  [facility] Procedure: 2D Echo, Cardiac Doppler and Color Doppler Indications:    Aortic regurgitation I35.1  History:        Patient has prior history of Echocardiogram examinations, most                 recent 06/15/2020. CHF, CAD, Pacemaker, Arrythmias:Atrial Flutter                 and LBBB; Risk Factors:Hypertension, Diabetes and Dyslipidemia.  Sonographer:    Alvino Chapel RCS Referring Phys: [id number] VISHNU P MALLIPEDDI IMPRESSIONS  1. Left ventricular ejection fraction, by estimation, is 60 to 65%. The left ventricle has normal function. The left ventricle has no regional wall motion abnormalities. There is mild left ventricular hypertrophy. Left ventricular diastolic function could not be evaluated.  2. Right ventricular systolic function is low normal. The right ventricular size is normal. There is mildly elevated pulmonary artery systolic pressure. The estimated right ventricular systolic  pressure is [id number] mmHg.  3. Left atrial size was severely dilated.  4. The mitral valve is abnormal. Trivial mitral valve regurgitation. Mild mitral stenosis. The mean mitral valve gradient is 3.0 mmHg. Severe mitral annular calcification  5. The aortic valve is tricuspid. Aortic valve regurgitation is mild to moderate. No aortic stenosis is present.  6. Aortic dilatation noted. There is borderline dilatation of the aortic root, measuring 37 mm.  7. The inferior vena cava is dilated in size with >50% respiratory variability, suggesting right atrial pressure of 8 mmHg. Comparison(s): No significant change from prior study. FINDINGS  Left Ventricle: Left ventricular ejection fraction, by estimation, is 60 to 65%. The left ventricle has normal function. The left ventricle has no regional wall motion abnormalities. The left ventricular internal cavity size was normal in size. There is  mild left ventricular hypertrophy. Left ventricular diastolic function could not be evaluated due to paced rhythm. Left ventricular diastolic function could not be evaluated. Right Ventricle: The right ventricular size is normal. No increase in right ventricular wall thickness. Right ventricular systolic function is low normal. There is mildly elevated pulmonary artery systolic pressure. The tricuspid regurgitant velocity is 2.91 m/s, and with an assumed right atrial pressure of 8 mmHg, the estimated right ventricular systolic pressure is 99991111 mmHg. Left Atrium: Left atrial size was severely dilated. Right Atrium: Right atrial size was normal in size. Pericardium: There is no evidence of pericardial effusion. Mitral Valve: The mitral valve is abnormal. Severe mitral annular calcification. Trivial mitral valve regurgitation. Mild mitral valve stenosis. MV peak gradient, 11.8 mmHg. The mean mitral valve gradient is 3.0 mmHg. Tricuspid Valve: The tricuspid valve is abnormal. Tricuspid valve regurgitation is mild . No evidence of tricuspid  stenosis. Aortic Valve: The aortic valve is tricuspid. Aortic valve regurgitation is mild to moderate. Aortic regurgitation PHT measures 387 msec. No aortic stenosis is present. Pulmonic Valve: The pulmonic valve was not well visualized. Pulmonic valve regurgitation is mild. No evidence of pulmonic stenosis. Aorta: Aortic dilatation noted. There is borderline dilatation of the aortic root, measuring 37 mm. Venous: The inferior vena cava is dilated in size with greater than 50% respiratory variability, suggesting right atrial pressure of 8 mmHg. IAS/Shunts: No atrial level shunt detected by color flow Doppler. Additional Comments: A device lead is visualized in the right ventricle and right atrium.  LEFT VENTRICLE PLAX 2D LVIDd:         4.60 cm LVIDs:         3.20 cm LV PW:         1.20 cm LV IVS:        1.20 cm LVOT diam:     2.00 cm LV SV:         64 LV SV Index:   39 LVOT Area:     3.14 cm  RIGHT VENTRICLE TAPSE (M-mode): 1.9 cm LEFT ATRIUM              Index        RIGHT ATRIUM           Index LA diam:        4.65 cm  2.88 cm/m   RA Area:     18.20 cm LA Vol (A2C):   106.0 ml 65.58 ml/m  RA Volume:   48.90 ml  30.25 ml/m LA Vol (A4C):   123.0 ml 76.10 ml/m LA Biplane Vol: 116.0 ml 71.77 ml/m  AORTIC VALVE LVOT Vmax:   89.50 cm/s LVOT Vmean:  58.900 cm/s LVOT VTI:    0.203 m AI PHT:      387 msec  AORTA Ao Root diam: 3.70 cm MITRAL VALVE                TRICUSPID VALVE MV Area (PHT): 3.17 cm     TR Peak grad:  33.9 mmHg MV Area VTI:   1.79 cm     TR Vmax:        291.00 cm/s MV Peak grad:  11.8 mmHg MV Mean grad:  3.0 mmHg     SHUNTS MV Vmax:       1.72 m/s     Systemic VTI:  0.20 m MV Vmean:      70.9 cm/s    Systemic Diam: 2.00 cm MV Decel Time: 239 msec MV E velocity: 147.00 cm/s MV A velocity: 36.70 cm/s MV E/A ratio:  4.01 Vishnu Priya Mallipeddi Electronically signed by Lorelee Cover Mallipeddi Signature Date/Time: 03/20/2022/3:57:23 PM    Final    CT PELVIS WO CONTRAST  Result Date:  03/19/2022 CLINICAL DATA:  Hernia suspected, inguinal or femoral. Possible reduction. EXAM: CT PELVIS WITHOUT CONTRAST TECHNIQUE: Multidetector CT imaging of the pelvis was performed following the standard protocol without intravenous contrast. RADIATION DOSE REDUCTION: This exam was performed according to the departmental dose-optimization program which includes automated exposure control, adjustment of the mA and/or kV according to patient size and/or use of iterative reconstruction technique. COMPARISON:  CT abdomen and pelvis earlier today at 6:09 p.m. FINDINGS: Urinary Tract: Contrast from prior CT within the bladder. No acute abnormality. Bowel: Decreased distention of the cecum after reduction of the right inguinal hernia. No bowel loops are present in the right inguinal hernia. Mild wall thickening of the cecum has decreased in earlier today. The ileocecal valve and appendix (circa series 3/image 28) are no longer within the hernia. Normal appearance of the appendix. The visualized small bowel is normal in caliber. Vascular/Lymphatic: Arterial atherosclerotic calcification. No suspicious lymphadenopathy. Reproductive:  No mass or other significant abnormality Other: Decreased size of the right inguinal hernia which contains fat fluid. Small amount of free fluid in the pelvis. Musculoskeletal: ORIF left femur.  No acute fracture. IMPRESSION: 1. Decreased size of the right inguinal hernia which contains fat and fluid. No bowel loops are present in the right inguinal hernia. 2. Decreased distention and wall thickening of the cecum after reduction of the right inguinal hernia. The ileocecal valve and appendix are no longer within the hernia. These results were called by telephone at the time of interpretation on 03/19/2022 at 8:49 pm to provider [facility] , who verbally acknowledged these results. Electronically Signed   By: Placido Sou M.D.   On: 03/19/2022 20:53   CT Abdomen Pelvis W  Contrast  Result Date: 03/19/2022 CLINICAL DATA:  Bowel obstruction suspected EXAM: CT ABDOMEN AND PELVIS WITH CONTRAST TECHNIQUE: Multidetector CT imaging of the abdomen and pelvis was performed using the standard protocol following bolus administration of intravenous contrast. RADIATION DOSE REDUCTION: This exam was performed according to the departmental dose-optimization program which includes automated exposure control, adjustment of the mA and/or kV according to patient size and/or use of iterative reconstruction technique. CONTRAST:  145mL OMNIPAQUE IOHEXOL 300 MG/ML  SOLN COMPARISON:  CT angio chest 01/05/2005 report without imaging, CT chest 10/28/2012 report without imaging FINDINGS: Lower chest: Centrally calcified 14 mm left lower lobe pulmonary nodule consistent with described chronic granuloma. Mild bronchial wall thickening. Mitral annular calcifications. Partially visualized cardiac leads. Possible tiny hiatal hernia. Hepatobiliary: No focal liver abnormality. Multiple gallstones noted within the gallbladder lumen. No gallbladder wall thickening or pericholecystic fluid. No biliary dilatation. Pancreas: Multiple fluid density lesions within the pancreas with large its along the pancreatic head measuring 2.6 by 2.1 cm (2:24). Normal pancreatic contour. No surrounding inflammatory changes. No main pancreatic ductal  dilatation. Spleen: Diffuse punctate calcifications consistent with sequelae of prior granulomatous disease. Normal in size without focal abnormality. Adrenals/Urinary Tract: No adrenal nodule bilaterally. Bilateral kidneys enhance symmetrically. Subcentimeter hypodensities too small to characterize-no further follow-up indicated. No hydronephrosis. No hydroureter. The urinary bladder is unremarkable. On delayed imaging, there is no urothelial wall thickening and there are no filling defects in the opacified portions of the bilateral collecting systems or ureters. Stomach/Bowel: Stomach is  within normal limits. Dilated loop of cecum measuring up to 6cm with associated surrounding free fluid contained within the right inguinal hernia. The ileocecal valve as well as base of the appendix is noted within the hernia. Proximal and distal transition points are noted at the entry and exit sites of the inguinal hernia. No evidence of small bowel wall thickening or dilatation. Few scattered colonic diverticula. Appendix appears normal. Vascular/Lymphatic: No abdominal aorta or iliac aneurysm. Severe atherosclerotic plaque of the aorta and its branches. No abdominal, pelvic, or inguinal lymphadenopathy. Reproductive: Uterus and bilateral adnexa are unremarkable. Other: Trace volume free fluid within the right inguinal hernia. No intraperitoneal free gas. No organized fluid collection. Musculoskeletal: Moderate volume right inguinal hernia containing a short loop of dilated terminal ileum with an abdominal defect of 2.3 cm. Free fluid is also noted within the hernia. Tiny fat containing umbilical hernia. No suspicious lytic or blastic osseous lesions. No acute displaced fracture. Multilevel degenerative changes of the spine as well as multilevel spondylolisthesis. Intramedullary nail fixation of the left femur. IMPRESSION: 1. Incarcerated right inguinal hernia leading to a cecal closed loop obstruction. The ileocecal valve and base of the appendix are also located within the hernia. Findings concerning for ischemia. Recommend emergent surgical consultation. 2. Colonic diverticulosis with no acute diverticulitis. 3. Cholelithiasis with no CT evidence of acute cholecystitis. 4. Multiple fluid density pancreatic lesions measuring up to 2.6 cm. 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 Correlation with more recent images would be of value to evaluate stability. Given patient's age, no further follow-up indicated at this time. When the patient is clinically stable and able to follow directions and hold their breath (preferably as  an outpatient) further evaluation with dedicated outpatient MRI pancreatic protocol could be considered on a case by case basis. 5. Aortic Atherosclerosis (ICD10-I70.0) including mitral annular calcifications. 6. Sequelae of prior granulomatous disease. These results were called by telephone at the time of interpretation on 03/19/2022 at 8:14 pm to provider [facility] , who verbally acknowledged these results. Electronically Signed   By: Iven Finn M.D.   On: 03/19/2022 20:17   CUP PACEART REMOTE DEVICE CHECK  Result Date: 03/08/2022 Scheduled non-billable remote reviewed. Normal device function.  Battery estimated 4.49mo Next remote 3/25 LA  DG Chest Port 1 View  Result Date: 02/28/2022 CLINICAL DATA:  Sepsis EXAM: PORTABLE CHEST 1 VIEW COMPARISON:  01/04/2015 FINDINGS: Shallow lung inflation. Cardiomediastinal contours are normal. No focal airspace consolidation or pulmonary edema. No pleural effusion or pneumothorax. Unchanged position of left chest wall AICD leads. IMPRESSION: Shallow lung inflation without acute cardiopulmonary process. Electronically Signed   By: Ulyses Jarred M.D.   On: 02/28/2022 03:40   CT HEAD WO CONTRAST (5MM)  Result Date: 02/28/2022 CLINICAL DATA:  Altered mental status EXAM: CT HEAD WITHOUT CONTRAST TECHNIQUE: Contiguous axial images were obtained from the base of the skull through the vertex without intravenous contrast. RADIATION DOSE REDUCTION: This exam was performed according to the departmental dose-optimization program which includes automated exposure control, adjustment of the mA and/or kV according to patient  size and/or use of iterative reconstruction technique. COMPARISON:  01/05/2005 FINDINGS: Brain: There is no mass, hemorrhage or extra-axial collection. The size and configuration of the ventricles and extra-axial CSF spaces are normal. There is hypoattenuation of the white matter, most commonly indicating chronic small vessel disease. Vascular: No  abnormal hyperdensity of the major intracranial arteries or dural venous sinuses. No intracranial atherosclerosis. Skull: The visualized skull base, calvarium and extracranial soft tissues are normal. Sinuses/Orbits: No fluid levels or advanced mucosal thickening of the visualized paranasal sinuses. No mastoid or middle ear effusion. The orbits are normal. IMPRESSION: No acute intracranial abnormality. Electronically Signed   By: Ulyses Jarred M.D.   On: 02/28/2022 03:34     Subjective: Patient seen examined bedside, resting calmly.  Lying in bed eating breakfast.  Pleasantly confused.  States "if wishes were horses, beggars would ride".  No complaints, concerns or questions this morning.  Discharging to SNF.  Denies headache, no dizziness, no chest pain, no palpitations, no shortness of breath, no abdominal pain, no fever/chills, no nausea/vomiting/diarrhea.  No acute events overnight per nursing staff.  Discharge Exam: Vitals:   03/23/22 0909 03/23/22 2016  BP: (!) 129/53 (!) 127/53  Pulse: 72 73  Resp:  18  Temp:  99.5 F (37.5 C)  SpO2:  96%   Vitals:   03/23/22 0400 03/23/22 0909 03/23/22 2016 03/24/22 0500  BP: 120/73 (!) 129/53 (!) 127/53   Pulse: 74 72 73   Resp: 16  18   Temp: 98.2 F (36.8 C)  99.5 F (37.5 C)   TempSrc: Oral  Oral   SpO2: 99%  96%   Weight:    68 kg  Height:        Physical Exam: GEN: NAD, alert and oriented to self, pleasantly confused, elderly in appearance HEENT: NCAT, PERRL, EOMI, sclera clear, MMM PULM: CTAB w/o wheezes/crackles, normal respiratory effort, on room air CV: RRR w/o M/G/R GI: abd soft, NTND, NABS, noted right lower quadrant surgical incision site well-approximated with Dermabond in place, no surrounding fluctuance/erythema/drainage MSK: no peripheral edema, muscle strength globally intact 5/5 bilateral upper/lower extremities NEURO: CN II-XII intact, no focal deficits, sensation to light touch intact PSYCH: normal  mood/affect Integumentary: Abdominal surgical site as above.      The results of significant diagnostics from this hospitalization (including imaging, microbiology, ancillary and laboratory) are listed below for reference.     Microbiology: Recent Results (from the past 240 hour(s))  Surgical pcr screen     Status: None   Collection Time: 03/21/22  7:00 PM   Specimen: Nasal Mucosa; Nasal Swab  Result Value Ref Range Status   MRSA, PCR NEGATIVE NEGATIVE Final   Staphylococcus aureus NEGATIVE NEGATIVE Final    Comment: (NOTE) The Xpert SA Assay (FDA approved for NASAL specimens in patients 10 years of age and older), is one component of a comprehensive surveillance program. It is not intended to diagnose infection nor to guide or monitor treatment. Performed at [facility], [address], [place], [place] [address]      Labs: BNP (last 3 results) No results for input(s): "BNP" in the last 8760 hours. Basic Metabolic Panel: Recent Labs  Lab 03/19/22 1745 03/20/22 0435 03/21/22 0900 03/22/22 0457 03/23/22 0505  NA 135 139 137 138 135  K 3.1* 4.0 3.6 4.0 3.4*  CL 104 107 108 106 103  CO2 25 27 24 24 25   GLUCOSE 137* 84 93 104* 137*  BUN 10 10 6* 7* 12  CREATININE 0.69 0.62 0.57 0.68 0.79  CALCIUM 9.8 9.4 9.5 9.7 10.0  MG  --  2.0 1.9 1.9  --   PHOS  --  2.8 2.3* 2.1*  --    Liver Function Tests: Recent Labs  Lab 03/19/22 1745 03/20/22 0435 03/22/22 0457  AST 22 15 15   ALT 18 14 11   ALKPHOS 64 55 54  BILITOT 0.8 0.8 1.0  PROT 7.1 5.9* 6.0*  ALBUMIN 3.7 3.2* 3.0*   No results for input(s): "LIPASE", "AMYLASE" in the last 168 hours. No results for input(s): "AMMONIA" in the last 168 hours. CBC: Recent Labs  Lab 03/19/22 1745 03/20/22 0435 03/21/22 0900 03/22/22 0457 03/23/22 0505  WBC 6.5 6.1 6.0 8.0 10.2  NEUTROABS 3.9  --   --  5.6  --   HGB 12.1 10.8* 11.0* 11.3* 12.2  HCT 36.8 33.4* 33.0* 34.1* 37.3  MCV 90.4 92.5 90.7 91.2 91.4  PLT 266 229 187  168 199   Cardiac Enzymes: No results for input(s): "CKTOTAL", "CKMB", "CKMBINDEX", "TROPONINI" in the last 168 hours. BNP: Invalid input(s): "POCBNP" CBG: Recent Labs  Lab 03/23/22 2037 03/24/22 0008 03/24/22 0335 03/24/22 0723 03/24/22 1126  GLUCAP 135* 97 103* 87 144*   D-Dimer No results for input(s): "DDIMER" in the last 72 hours. Hgb A1c No results for input(s): "HGBA1C" in the last 72 hours. Lipid Profile No results for input(s): "CHOL", "HDL", "LDLCALC", "TRIG", "CHOLHDL", "LDLDIRECT" in the last 72 hours. Thyroid function studies No results for input(s): "TSH", "T4TOTAL", "T3FREE", "THYROIDAB" in the last 72 hours.  Invalid input(s): "FREET3" Anemia work up No results for input(s): "VITAMINB12", "FOLATE", "FERRITIN", "TIBC", "IRON", "RETICCTPCT" in the last 72 hours. Urinalysis    Component Value Date/Time   COLORURINE AMBER (A) 03/01/2022 1831   APPEARANCEUR CLOUDY (A) 03/01/2022 1831   LABSPEC 1.017 03/01/2022 1831   PHURINE 8.0 03/01/2022 1831   GLUCOSEU NEGATIVE 03/01/2022 1831   HGBUR NEGATIVE 03/01/2022 1831   BILIRUBINUR NEGATIVE 03/01/2022 1831   KETONESUR NEGATIVE 03/01/2022 1831   PROTEINUR 30 (A) 03/01/2022 1831   NITRITE NEGATIVE 03/01/2022 1831   LEUKOCYTESUR SMALL (A) 03/01/2022 1831   Sepsis Labs Recent Labs  Lab 03/20/22 0435 03/21/22 0900 03/22/22 0457 03/23/22 0505  WBC 6.1 6.0 8.0 10.2   Microbiology Recent Results (from the past 240 hour(s))  Surgical pcr screen     Status: None   Collection Time: 03/21/22  7:00 PM   Specimen: Nasal Mucosa; Nasal Swab  Result Value Ref Range Status   MRSA, PCR NEGATIVE NEGATIVE Final   Staphylococcus aureus NEGATIVE NEGATIVE Final    Comment: (NOTE) The Xpert SA Assay (FDA approved for NASAL specimens in patients 42 years of age and older), is one component of a comprehensive surveillance program. It is not intended to diagnose infection nor to guide or monitor treatment. Performed at [facility], [address], [place], [place] [address]      Time coordinating discharge: Over 30 minutes  SIGNED:   Yann Biehn J [place] DO  [facility] 03/24/2022, 11:59 AM

## 2022-03-24 NOTE — Care Management Important Message (Signed)
Important Message  Patient Details  Name: Brittany [name] MRN: [id number] Date of Birth: May 08, 1927   Medicare Important Message Given:  Yes     Tommy Medal 03/24/2022, 12:58 PM

## 2022-03-27 NOTE — Progress Notes (Signed)
Remote pacemaker transmission.   

## 2022-03-27 NOTE — Addendum Note (Signed)
Addended by: Cheri Kearns A on: 03/27/2022 01:21 PM   Modules accepted: Level of Service

## 2022-04-03 ENCOUNTER — Encounter (HOSPITAL_COMMUNITY): Payer: Self-pay | Admitting: General Surgery

## 2022-04-03 ENCOUNTER — Ambulatory Visit: Payer: Medicare Other

## 2022-04-03 LAB — CUP PACEART REMOTE DEVICE CHECK
Battery Remaining Longevity: 5 mo
Battery Remaining Percentage: 7 %
Battery Voltage: 2.8 V
Brady Statistic RV Percent Paced: 99 %
Date Time Interrogation Session: 20240325083349
Implantable Lead Connection Status: 753985
Implantable Lead Connection Status: 753985
Implantable Lead Implant Date: 20061229
Implantable Lead Implant Date: 20061229
Implantable Lead Location: 753859
Implantable Lead Location: 753860
Implantable Lead Model: 5076
Implantable Pulse Generator Implant Date: 20170329
Lead Channel Impedance Value: 680 Ohm
Lead Channel Pacing Threshold Amplitude: 1.75 V
Lead Channel Pacing Threshold Pulse Width: 1 ms
Lead Channel Sensing Intrinsic Amplitude: 12 mV
Lead Channel Setting Pacing Amplitude: 4 V
Lead Channel Setting Pacing Pulse Width: 1 ms
Lead Channel Setting Sensing Sensitivity: 6 mV
Pulse Gen Model: 2240
Pulse Gen Serial Number: 7892876

## 2022-04-19 ENCOUNTER — Encounter: Payer: Self-pay | Admitting: General Surgery

## 2022-04-19 ENCOUNTER — Ambulatory Visit (INDEPENDENT_AMBULATORY_CARE_PROVIDER_SITE_OTHER): Payer: Medicare Other | Admitting: General Surgery

## 2022-04-19 VITALS — BP 104/60 | HR 72 | Temp 97.6°F | Resp 16 | Ht 59.0 in | Wt 149.0 lb

## 2022-04-19 DIAGNOSIS — K4030 Unilateral inguinal hernia, with obstruction, without gangrene, not specified as recurrent: Secondary | ICD-10-CM

## 2022-04-19 NOTE — Progress Notes (Unsigned)
[facility]  No major issues. Daughter reports she is feeling well. Eating and having Bms.   BP 104/60   Pulse 72   Temp 97.6 F (36.4 C) (Oral)   Resp 16   Ht 4\' 11"  (1.499 m)   Wt 149 lb (67.6 kg)   SpO2 97%   BMI 30.09 kg/m  Right inguinal hernia site c/d/I with no erythema or drainage  Patient s/p right inguinal hernia repair with mesh. Doing well.   No heavy lifting > 10 lbs, excessive bending, pushing, pulling, or squatting for 6-8 weeks after surgery.  Diet as tolerated. Call with issues   Algis Greenhouse, MD [facility] [address] [place], [place] [address] [phone] (office)

## 2022-04-19 NOTE — Patient Instructions (Signed)
No heavy lifting > 10 lbs, excessive bending, pushing, pulling, or squatting for 6-8 weeks after surgery.  Diet as tolerated. Call with issues.  

## 2022-04-21 DIAGNOSIS — R2681 Unsteadiness on feet: Secondary | ICD-10-CM | POA: Insufficient documentation

## 2022-04-24 ENCOUNTER — Encounter: Payer: Self-pay | Admitting: Gastroenterology

## 2022-04-24 ENCOUNTER — Ambulatory Visit: Payer: Medicare Other | Admitting: Gastroenterology

## 2022-04-24 VITALS — BP 111/68 | HR 73 | Temp 97.5°F | Ht 62.0 in | Wt 143.4 lb

## 2022-04-24 DIAGNOSIS — R1110 Vomiting, unspecified: Secondary | ICD-10-CM

## 2022-04-24 DIAGNOSIS — K869 Disease of pancreas, unspecified: Secondary | ICD-10-CM

## 2022-04-24 MED ORDER — FAMOTIDINE 20 MG PO TABS: 20.0000 mg | ORAL_TABLET | Freq: Every day | ORAL | 1 refills | Status: DC

## 2022-04-24 NOTE — Progress Notes (Signed)
GI Office Note    Referring Provider: Tracey Harries, MD Primary Care Physician:  Tracey Harries, MD  Primary Gastroenterologist: Hennie Duos. Marletta Lor, DO   Chief Complaint   Chief Complaint  Patient presents with   Gastroesophageal Reflux    Currently on famotidine 20 mg, took last one today. No current issues. Was having issues with regurgitation, no longer an issue.     History of Present Illness   Brittany [name] is a 89 y.o. female presenting today for further evaluation of inability to eat/regurgitation.  Her daughter helps provide history. Patient with past medical history significant for HTN, HLD, CHB s/p PPM, type 2 diabetes mellitus, chronic diastolic congestive heart failure, atrial flutter on Eliquis.  Recent admission last month recent admission last month for worsening abdominal pain at site of known right inguinal hernia.  CT scan showed incarcerated right inguinal hernia with cecal closed-loop obstruction, ileocecal valve and base of appendix within the hernia with findings concerning for ischemia.  She underwent reduction but ultimately had repair of her hernia to prevent future episodes.  Incidentally noted to have multiple fluid-filled density pancreatic lesions measuring up to 2.6 cm likely representative of IPMN's.  For past year has had issues with right inguinal hernia. Noted intermittent regurgitation or spitting up at end of meal. Symptoms seemed to get worse around time of this incarcerated hernia. While in nursing home, patient was having difficulty eating. On mechanical soft diet. Would eat on bites initially. As time progressed, she could eat more, certain foods not tolerated well which she typically tried to avoid. But at end of meal, typically would develop episodes of spitting up. No heaving, likely more of regurgitation. She denies heartburn, dysphagia. BMs doing well. No melena, brbpr. No abdominal pain.   She has been home for two weeks. For the past one  week, no episodes of regurgitation. Eating 4-5 small meals daily. Avoiding over eating. Daughter is concerned about pancreatitic lesions, wants them followed to make sure stable. We discussed that MRI pancreas would be difficult exam and picture quality would not be good if patient could not stay still and breath hold. CT may be better option although given her age and comorbidities she would not likely be a candidate for any type of surgical intervention of her pancreas if she were to have a malignancy.     CT A/P with contrast 03/2022: IMPRESSION: 1. Incarcerated right inguinal hernia leading to a cecal closed loop obstruction. The ileocecal valve and base of the appendix are also located within the hernia. Findings concerning for ischemia. Recommend emergent surgical consultation. 2. Colonic diverticulosis with no acute diverticulitis. 3. Cholelithiasis with no CT evidence of acute cholecystitis. 4. Multiple fluid density pancreatic lesions measuring up to 2.6 cm. 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 Correlation with more recent images would be of value to evaluate stability. Given patient's age, no further follow-up indicated at this time. When the patient is clinically stable and able to follow directions and hold their breath (preferably as an outpatient) further evaluation with dedicated outpatient MRI pancreatic protocol could be considered on a case by case basis. 5. Aortic Atherosclerosis (ICD10-I70.0) including mitral annular calcifications. 6. Sequelae of prior granulomatous disease    Medications   Current Outpatient Medications  Medication Sig Dispense Refill   cyanocobalamin 1000 MCG tablet Take 1,000 mcg by mouth daily.     ELIQUIS 5 MG TABS tablet TAKE 1 TABLET TWICE A DAY (Patient taking differently: Take 5 mg by mouth  2 (two) times daily.) 60 tablet 6   famotidine (PEPCID) 20 MG tablet Take 20 mg by mouth daily.     furosemide (LASIX) 40 MG tablet Take 1 tablet (40 mg  total) by mouth daily. 30 tablet 1   irbesartan (AVAPRO) 75 MG tablet Take 0.5 tablets (37.5 mg total) by mouth daily. 45 tablet 2   isosorbide mononitrate (IMDUR) 30 MG 24 hr tablet Take 1 tablet by mouth daily.     liver oil-zinc oxide (DESITIN) 40 % ointment Apply topically 2 (two) times daily. 56.7 g 0   metFORMIN (GLUCOPHAGE-XR) 500 MG 24 hr tablet Take 500 mg by mouth daily with breakfast.     Multiple Vitamins-Minerals (MULTIVITAMIN WOMEN 50+) TABS Take 1 tablet by mouth daily.     ondansetron (ZOFRAN) 8 MG tablet Take by mouth.     potassium chloride SA (KLOR-CON M) 20 MEQ tablet Take 20 mEq by mouth daily.     senna-docusate (SENOKOT-S) 8.6-50 MG tablet Take 2 tablets by mouth 2 (two) times daily. (Patient taking differently: Take 1 tablet by mouth 2 (two) times daily.) 120 tablet 1   No current facility-administered medications for this visit.    Allergies   Allergies as of 04/24/2022 - Review Complete 04/24/2022  Allergen Reaction Noted   Ciprofloxacin Other (See Comments) 01/18/2012   Ace inhibitors Other (See Comments) 11/05/2006   Cedax [ceftibuten] Other (See Comments) 10/15/2013   Lipitor [atorvastatin] Other (See Comments) 11/05/2006   Plavix [clopidogrel bisulfate] Other (See Comments) 01/18/2012   Statins Other (See Comments) 10/15/2013    Past Medical History   Past Medical History:  Diagnosis Date   Atrial flutter    CAD (coronary artery disease)    CHF (congestive heart failure)    Complete heart block    a. s/p STJ dual chamber PPM   Depression    Glaucoma    Hyperlipidemia    Hypertension    Pacemaker    Type II diabetes mellitus     Past Surgical History   Past Surgical History:  Procedure Laterality Date   BREAST BIOPSY  1991   ELECTROPHYSIOLOGIC STUDY N/A 02/12/2015   cardioversion by Dr Graciela Husbands    EP IMPLANTABLE DEVICE N/A 04/07/2015   a. STJ dual chamber PPM implanted 2006 for complete heart block b. gen change 2017   FEMUR IM NAIL   01/18/2012   Procedure: INTRAMEDULLARY (IM) NAIL FEMORAL;  Surgeon: Senaida Lange, MD;  Location: [facility] OR;  Service: Orthopedics;  Laterality: Left;   INGUINAL HERNIA REPAIR Right 03/22/2022   Procedure: HERNIA REPAIR INGUINAL ADULT;  Surgeon: Lucretia Roers, MD;  Location: [facility] ORS;  Service: General;  Laterality: Right;   left total knee replacement Bilateral 1996   TONSILLECTOMY  1937    Past Family History   Family History  Problem Relation Age of Onset   Diabetes Mother    Heart attack Father    Stroke Father    Hypertension Father    Kidney disease Daughter     Past Social History   Social History   Socioeconomic History   Marital status: Divorced    Spouse name: Not on file   Number of children: 5   Years of education: Not on file   Highest education level: Not on file  Occupational History   Occupation: Retired  Tobacco Use   Smoking status: Former    Types: Cigarettes    Quit date: 01/17/1951    Years since quitting: 73.3  Smokeless tobacco: Never  Vaping Use   Vaping Use: Never used  Substance and Sexual Activity   Alcohol use: Not Currently    Comment: Occasional glass of wine   Drug use: No   Sexual activity: Not Currently  Other Topics Concern   Not on file  Social History Narrative   Not on file   Social Determinants of Health   Financial Resource Strain: Not on file  Food Insecurity: No Food Insecurity (02/28/2022)   Hunger Vital Sign    Worried About Running Out of Food in the Last Year: Never true    Ran Out of Food in the Last Year: Never true  Transportation Needs: No Transportation Needs (02/28/2022)   PRAPARE - Administrator, Civil Service (Medical): No    Lack of Transportation (Non-Medical): No  Physical Activity: Not on file  Stress: Not on file  Social Connections: Not on file  Intimate Partner Violence: Not At Risk (02/28/2022)   Humiliation, Afraid, Rape, and Kick questionnaire    Fear of Current or Ex-Partner: No     Emotionally Abused: No    Physically Abused: No    Sexually Abused: No    Review of Systems   General: Negative for anorexia, weight loss, fever, chills, fatigue, weakness. Eyes: Negative for vision changes.  ENT: Negative for hoarseness, difficulty swallowing , nasal congestion. CV: Negative for chest pain, angina, palpitations, dyspnea on exertion, peripheral edema.  Respiratory: Negative for dyspnea at rest, dyspnea on exertion, cough, sputum, wheezing.  GI: See history of present illness. GU:  Negative for dysuria, hematuria, urinary incontinence, urinary frequency, nocturnal urination.  MS: Negative for joint pain, low back pain.  Derm: Negative for  itching. H/o rashes under breast and under panniculus. Neuro: Negative for weakness, abnormal sensation, seizure, frequent headaches, memory loss,  confusion.  Psych: Negative for anxiety, depression, suicidal ideation, hallucinations.  Endo: Negative for unusual weight change.  Heme: Negative for bruising or bleeding. Allergy: Negative for rash or hives.  Physical Exam   BP 111/68 (BP Location: Left Arm, Patient Position: Sitting, Cuff Size: Large)   Pulse 73   Temp (!) 97.5 F (36.4 C) (Oral)   Ht  (1.575 m)   Wt 143 lb 6.4 oz (65 kg)   SpO2 95%   BMI 26.23 kg/m    General: Well-nourished, well-developed in no acute distress.  Head: Normocephalic, atraumatic.   Eyes: Conjunctiva pink, no icterus. Mouth: Oropharyngeal mucosa moist and pink , no lesions erythema or exudate. Neck: Supple without thyromegaly, masses, or lymphadenopathy.  Lungs: Clear to auscultation bilaterally.  Heart: Regular rate and rhythm, no murmurs rubs or gallops.  Abdomen: Bowel sounds are normal, nontender, nondistended, no rebound or guarding.  Examined in wheelchair. Site of right inguinal hernia well healed.  Rectal: not performed Extremities: No lower extremity edema. No clubbing or deformities.  Neuro: Alert and oriented x 4 , grossly  normal neurologically.  Skin: Warm and dry, no rash or jaundice.   Psych: Alert and cooperative, normal mood and affect.  Labs   Lab Results  Component Value Date   CREATININE 0.79 03/23/2022   BUN 12 03/23/2022   NA 135 03/23/2022   K 3.4 (L) 03/23/2022   CL 103 03/23/2022   CO2 25 03/23/2022   Lab Results  Component Value Date   WBC 10.2 03/23/2022   HGB 12.2 03/23/2022   HCT 37.3 03/23/2022   MCV 91.4 03/23/2022   PLT 199 03/23/2022   Lab  Results  Component Value Date   ALT 11 03/22/2022   AST 15 03/22/2022   ALKPHOS 54 03/22/2022   BILITOT 1.0 03/22/2022   Lab Results  Component Value Date   HGBA1C 5.9 (H) 02/28/2022   Lab Results  Component Value Date   VITAMINB12 309 03/01/2022    Imaging Studies   CUP PACEART REMOTE DEVICE CHECK  Result Date: 04/03/2022 Scheduled remote reviewed. Normal device function.  Known AF, Eliquis Battery estimated 4.44mo Next remote 4/25 [place]   Assessment/Plan:   Regurgitation: history of regurgitation/spitting up after meals for over a year but more progressive with recent incarcerated right inguinal hernia and hospitalization. She required surgical intervention. She had significant difficult eating while at rehab, eating on bites initially but with time her ability to eat improved. She typically has regurgitation towards end of meal. I suspect she may have regurgitation due to GERD, esophageal dysmotility, cannot rule out hiatal hernia. Does not sound like esophageal stricture but this is on differential as well. Fortunately over the past one week, she seems to be doing a lot better. Daughter would like to monitor for now which I feel is reasonable. If recurrent symptoms, would consider barium esophagram as next step. Agree with continue pepcid 20mg  daily for now but could try weaning off if she continues to do well over the next couple of months.   Pancreatic lesions: multiple fluid density lesions with largest measuring 2.6cm. likely  represent IPMNs. Discussed with patient's daughter, given her age we could consider following clinically as she would not likely be candidate for surgery if malignancy suspected. She is very interested in pursuing imaging to check for stability. She would not be good candidate for MRI as she would not likely lay still and being able to breath hold for good picture quality. Consider CT protocol pancreas instead.      Leanna Battles. Melvyn Neth, MHS, PA-C [facility]

## 2022-04-24 NOTE — Patient Instructions (Signed)
Your symptoms of spitting up are likely due to regurgitation/reflux and aged esophagus. I'm glad you are doing better at this time. If you continue to tolerate food without regurgitation, then nothing else needs to be done right now. I would recommend staying on Pepcid once daily for next several months and if still doing well you can wean off the medication. If you have recurrent issues with regurgitation, then we would off a xray of your esophagus to look for strictures. As far as your pancreas, we can opt to do nothing or as per your request, we can obtain a follow up study in towards the end of the year to make sure stable. I do not think you would be a candidate for any type of pancreas surgery given your age and other medical issues.  Call my CMA, Tammy, if you have any questions or concerns. Her number is [phone].

## 2022-05-04 ENCOUNTER — Ambulatory Visit (INDEPENDENT_AMBULATORY_CARE_PROVIDER_SITE_OTHER): Payer: Medicare Other

## 2022-05-04 DIAGNOSIS — I442 Atrioventricular block, complete: Secondary | ICD-10-CM

## 2022-05-04 LAB — CUP PACEART REMOTE DEVICE CHECK
Battery Remaining Longevity: 4 mo
Battery Remaining Percentage: 7 %
Battery Voltage: 2.78 V
Brady Statistic RV Percent Paced: 99 %
Date Time Interrogation Session: 20240425020012
Implantable Lead Connection Status: 753985
Implantable Lead Connection Status: 753985
Implantable Lead Implant Date: 20061229
Implantable Lead Implant Date: 20061229
Implantable Lead Location: 753859
Implantable Lead Location: 753860
Implantable Lead Model: 5076
Implantable Pulse Generator Implant Date: 20170329
Lead Channel Impedance Value: 690 Ohm
Lead Channel Pacing Threshold Amplitude: 1.75 V
Lead Channel Pacing Threshold Pulse Width: 1 ms
Lead Channel Sensing Intrinsic Amplitude: 12 mV
Lead Channel Setting Pacing Amplitude: 4 V
Lead Channel Setting Pacing Pulse Width: 1 ms
Lead Channel Setting Sensing Sensitivity: 6 mV
Pulse Gen Model: 2240
Pulse Gen Serial Number: 7892876

## 2022-05-05 ENCOUNTER — Telehealth: Payer: Self-pay | Admitting: Internal Medicine

## 2022-05-05 NOTE — Telephone Encounter (Signed)
Daughter called to see when patient needs to get a new pacemaker. Please advise

## 2022-05-05 NOTE — Telephone Encounter (Signed)
Returned call to daughter.  Advised 4 months to ERI-then an additional 3 months to schedule procedure.  Daughter indicates understanding.  She was relieved.

## 2022-05-11 NOTE — Progress Notes (Signed)
Cardiology Office Note:    Date:  05/12/2022   ID:  Brittany [name], DOB 1927-12-03, MRN [id number]  PCP:  Tracey Harries, MD   [facility] Providers Cardiologist:  None Electrophysiologist:  Sherryl Manges, MD     Referring MD: Tracey Harries, MD   Chief Complaint: hospital follow-up  History of Present Illness:    Brittany [name] is a pleasant 89 y.o. female with a hx of complete heart block s/p pacemaker implant with generator replacement 2017, CAD with prior POBA to LAD with normal left ventricular function at the time, atrial flutter that appeared to be typical, however upon entrainment mapping demonstrated left atrial flutter which was cardioverted on chronic anticoagulation, hypertension, hyperlipidemia, type 2 diabetes, chronic HFpEF  Admission 12/2014 for congestive heart failure with TTE that demonstrated normal LV function and moderate-severe LAE. She has maintained consistent follow-up.   Last cardiology clinic visit was 02/20/2022 with Dr. Graciela Husbands. Blood pressures have been increasingly low.  Carvedilol previously decreased to once a day.  Her device is approaching ERI.  When it comes time to change her battery, anticipate [employer] as her required LV lead output is high.  Also anticipate [employer] pouch as this would be her third device.  She was advised to stop carvedilol.  Plan that if BP remains below 130 systolic, stop irbesartan in 2 weeks due to concern for risk of fall.  Admission 03/2022 for abdominal pain surrounding her known hernia.  Hernia had become more protrusive causing pain which she is usually able to self reduce when laying flat but now unable to reduce. CT abdomen/pelvis with contrast with incarcerated right inguinal hernia with cecal closed-loop obstruction, ileocecal valve and base of appendix also located within the hernia with findings concerning for ischemia; colonic diverticulosis without acute diverticulitis, cholelithiasis without evidence of acute  cholecystitis, multiple fluid density pancreatic lesions measuring up to 2.6 cm likely representative of intraductal papillary mucinous neoplasm, aortic atherosclerosis. General surgery was consulted and right incarcerated inguinal hernia was reduced.    Today, she is here with her daughter who states they were told to follow-up with cardiology after surgery. Patient uses a walker and ambulates slowly. Follows commands and occasional answers a question but is mostly non-verbal. Daughter reports she fell on 4/25 in her bedroom. The way she fell she thinks she was reaching for something, has a bruise on her back. Recovery from surgery has been uncomplicated except for the first week post surgery due to n/v. Appetite is better. Has not been monitoring BP on a consistent basis. Patient does not verbalize any concerns or symptoms. Daughter has no specific concerns. Patient denies dizziness, chest pain, shortness of breath, palpitations.  Past Medical History:  Diagnosis Date   Atrial flutter (HCC)    CAD (coronary artery disease)    CHF (congestive heart failure) (HCC)    Complete heart block (HCC)    a. s/p STJ dual chamber PPM   Depression    Glaucoma    Hyperlipidemia    Hypertension    Pacemaker    Type II diabetes mellitus (HCC)     Past Surgical History:  Procedure Laterality Date   BREAST BIOPSY  1991   ELECTROPHYSIOLOGIC STUDY N/A 02/12/2015   cardioversion by Dr Graciela Husbands    EP IMPLANTABLE DEVICE N/A 04/07/2015   a. STJ dual chamber PPM implanted 2006 for complete heart block b. gen change 2017   FEMUR IM NAIL  01/18/2012   Procedure: INTRAMEDULLARY (IM) NAIL FEMORAL;  Surgeon: Senaida Lange, MD;  Location: [facility] OR;  Service: Orthopedics;  Laterality: Left;   INGUINAL HERNIA REPAIR Right 03/22/2022   Procedure: HERNIA REPAIR INGUINAL ADULT;  Surgeon: Lucretia Roers, MD;  Location: [facility] ORS;  Service: General;  Laterality: Right;   left total knee replacement Bilateral 1996    TONSILLECTOMY  1937    Current Medications: Current Meds  Medication Sig   cyanocobalamin 1000 MCG tablet Take 1,000 mcg by mouth daily.   ELIQUIS 5 MG TABS tablet TAKE 1 TABLET TWICE A DAY   famotidine (PEPCID) 20 MG tablet Take 1 tablet (20 mg total) by mouth daily.   furosemide (LASIX) 40 MG tablet Take 1 tablet (40 mg total) by mouth daily.   irbesartan (AVAPRO) 75 MG tablet Take 0.5 tablets (37.5 mg total) by mouth daily.   isosorbide mononitrate (IMDUR) 30 MG 24 hr tablet Take 1 tablet by mouth daily.   metFORMIN (GLUCOPHAGE-XR) 500 MG 24 hr tablet Take 500 mg by mouth daily with breakfast.   Multiple Vitamins-Minerals (MULTIVITAMIN WOMEN 50+) TABS Take 1 tablet by mouth daily.   potassium chloride SA (KLOR-CON M) 20 MEQ tablet Take 20 mEq by mouth daily.   senna-docusate (SENOKOT-S) 8.6-50 MG tablet Take 2 tablets by mouth 2 (two) times daily.     Allergies:   Ciprofloxacin, Ace inhibitors, Cedax [ceftibuten], Lipitor [atorvastatin], Plavix [clopidogrel bisulfate], and Statins   Social History   Socioeconomic History   Marital status: Divorced    Spouse name: Not on file   Number of children: 5   Years of education: Not on file   Highest education level: Not on file  Occupational History   Occupation: Retired  Tobacco Use   Smoking status: Former    Types: Cigarettes    Quit date: 01/17/1951    Years since quitting: 71.3   Smokeless tobacco: Never  Vaping Use   Vaping Use: Never used  Substance and Sexual Activity   Alcohol use: Not Currently    Comment: Occasional glass of wine   Drug use: No   Sexual activity: Not Currently  Other Topics Concern   Not on file  Social History Narrative   Not on file   Social Determinants of Health   Financial Resource Strain: Not on file  Food Insecurity: No Food Insecurity (02/28/2022)   Hunger Vital Sign    Worried About Running Out of Food in the Last Year: Never true    Ran Out of Food in the Last Year: Never true   Transportation Needs: No Transportation Needs (02/28/2022)   PRAPARE - Administrator, Civil Service (Medical): No    Lack of Transportation (Non-Medical): No  Physical Activity: Not on file  Stress: Not on file  Social Connections: Not on file     Family History: The patient's family history includes Diabetes in her mother; Heart attack in her father; Hypertension in her father; Kidney disease in her daughter; Stroke in her father.  ROS:   Please see the history of present illness.   All other systems reviewed and are negative.  Labs/Other Studies Reviewed:    The following studies were reviewed today:  Echo 03/20/22  1. Left ventricular ejection fraction, by estimation, is 60 to 65%. The  left ventricle has normal function. The left ventricle has no regional  wall motion abnormalities. There is mild left ventricular hypertrophy.  Left ventricular diastolic function  could not be evaluated.   2. Right ventricular systolic function is  low normal. The right  ventricular size is normal. There is mildly elevated pulmonary artery  systolic pressure. The estimated right ventricular systolic pressure is  41.9 mmHg.   3. Left atrial size was severely dilated.   4. The mitral valve is abnormal. Trivial mitral valve regurgitation. Mild  mitral stenosis. The mean mitral valve gradient is 3.0 mmHg. Severe mitral  annular calcification   5. The aortic valve is tricuspid. Aortic valve regurgitation is mild to  moderate. No aortic stenosis is present.   6. Aortic dilatation noted. There is borderline dilatation of the aortic  root, measuring 37 mm.   7. The inferior vena cava is dilated in size with >50% respiratory  variability, suggesting right atrial pressure of 8 mmHg.   Comparison(s): No significant change from prior study.  Recent Labs: 03/01/2022: TSH 1.250 03/22/2022: ALT 11; Magnesium 1.9 03/23/2022: BUN 12; Creatinine, Ser 0.79; Hemoglobin 12.2; Platelets 199;  Potassium 3.4; Sodium 135  Recent Lipid Panel    Component Value Date/Time   CHOL 210 (HH) 07/06/2006 0936   TRIG 133 07/06/2006 0936   HDL 49.1 07/06/2006 0936   CHOLHDL 4.3 CALC 07/06/2006 0936   VLDL 27 07/06/2006 0936   LDLCALC 83 04/03/2006 0958   LDLDIRECT 132.3 07/06/2006 0936     Risk Assessment/Calculations:    CHA2DS2-VASc Score = 6     This indicates a 9.7% annual risk of stroke. The patient's score is based upon: CHF History: 1 HTN History: 1 Diabetes History: 0 Stroke History: 0 Vascular Disease History: 1 Age Score: 2 Gender Score: 1          Physical Exam:    VS:  BP 126/68   Pulse 74   Resp (!) 97   Ht 5\' 2"  (1.575 m)   Wt 140 lb (63.5 kg)   BMI 25.61 kg/m     Wt Readings from Last 3 Encounters:  05/12/22 140 lb (63.5 kg)  04/24/22 143 lb 6.4 oz (65 kg)  04/19/22 149 lb (67.6 kg)     GEN: Chronically ill appearing in no acute distress HEENT: Normal NECK: No JVD; No carotid bruits CARDIAC:  RRR, no murmurs, rubs, gallops RESPIRATORY:  Clear to auscultation without rales, wheezing or rhonchi  ABDOMEN: Soft, non-tender, non-distended MUSCULOSKELETAL:  No edema; No deformity. 2+ pedal pulses, equal bilaterally SKIN: Warm and dry NEUROLOGIC:  Alert, follows commands PSYCHIATRIC:  mostly non-verbal  EKG:  EKG is not ordered today.         Diagnoses:    1. Essential hypertension   2. Atrial flutter, unspecified type (HCC)   3. Chronic anticoagulation   4. AV BLOCK, COMPLETE   5. PACEMAKER, PERMANENT   6. Chronic diastolic CHF (congestive heart failure) (HCC)    Assessment and Plan:     Hypertension: BP has been consistently soft.  It is well-controlled today. Daughter has not been monitoring on a consistent basis. No signs of orthostatic hypotension. We will continue medications at current doses.  I have given daughter guidelines for monitoring home BP and asked her to report concerns to [place].  CHB s/p PPM implant: Device is  approaching ERI. Dr. Graciela Husbands anticipates changing to [facility] Scientific device.  Remote device check 05/04/2022 reveals normal device function, no new alerts.  Continue to monitor consistently. I will send a note to scheduling to ensure patient has soon follow-up with Dr. Graciela Husbands or APP.   HFpEF: Echo 03/20/22 with LVEF 60-65%, mild LVH, unable to evaluate diastolic function, elevated RVSP, mildly  elevated PASP. No evidence of volume overload on exam. She denies edema, orthopnea, PND, dyspnea. Continue irbesartan.  Atrial flutter on chronic anticoagulation: HR is well controlled today.  She is no longer on rate control therapy. No palpitations, no tachycardia to pt/daughter awareness. No bleeding concerns. Weight is 63.5 kg today. Advised daughter to notify us if weight < 132 lb as she will need to change Eliquis dose to 2.5 mg bid.  Continue Eliquis 5 mg twice daily for stroke prevention for CHA2DS2-VASc score of 6.   CAD without angina: Remote POBA to LAD. She denies chest pain, dyspnea, or other symptoms concerning for angina. No indication for further ischemic evaluation at this time. Not on asa in setting of OAC. Continue isosorbide, irbesartan.      Disposition: Follow-up in 3-4 months with Dr. Graciela Husbands  Medication Adjustments/Labs and Tests Ordered: Current medicines are reviewed at length with the patient today.  Concerns regarding medicines are outlined above.  No orders of the defined types were placed in this encounter.  No orders of the defined types were placed in this encounter.   Patient Instructions  Medication Instructions:  Your physician recommends that you continue on your current medications as directed. Please refer to the Current Medication list given to you today.  *If you need a refill on your cardiac medications before your next appointment, please call your pharmacy*   Follow-Up: At [facility], you and your health needs are our priority.  As part of our  continuing mission to provide you with exceptional heart care, we have created designated Provider Care Teams.  These Care Teams include your primary Cardiologist (physician) and Advanced Practice Providers (APPs -  Physician Assistants and Nurse Practitioners) who all work together to provide you with the care you need, when you need it.   Your next appointment:   We will notify you with appointment  Provider:   Dr. Graciela Husbands  Other Instructions Notify us if your weight drops to 132lbs or less   HOW TO TAKE YOUR BLOOD PRESSURE  Rest 5 minutes before taking your blood pressure. Don't  smoke or drink caffeinated beverages for at least 30 minutes before. Take your blood pressure before (not after) you eat. Sit comfortably with your back supported and both feet on the floor ( don't cross your legs). Elevate your arm to heart level on a table or a desk. Use the proper sized cuff.  It should fit smoothly and snugly around your bare upper arm.  There should be  Enough room to slip a fingertip under the cuff.  The bottom edge of the cuff should be 1 inch above the crease Of the elbow. Please monitor your blood pressure once daily 2 hours after your am medication.   If you blood pressure Consistently remains above 130 (systolic) top number or over 80 ( diastolic) bottom number X 3 days   Or 110 or less (systolic) top number or under 60 ( diastolic) bottom number X 3 days Consecutively.  Please call our office at [phone] or send Mychart message.     ----Avoid cold medicines with D or DM at the end of them----      Signed, Levi Aland, NP  05/12/2022 11:06 AM    [facility]

## 2022-05-12 ENCOUNTER — Ambulatory Visit: Payer: Medicare Other | Attending: Internal Medicine | Admitting: Nurse Practitioner

## 2022-05-12 ENCOUNTER — Ambulatory Visit: Payer: Medicare Other | Admitting: Internal Medicine

## 2022-05-12 ENCOUNTER — Encounter: Payer: Self-pay | Admitting: Nurse Practitioner

## 2022-05-12 VITALS — BP 126/68 | HR 74 | Resp 97 | Ht 62.0 in | Wt 140.0 lb

## 2022-05-12 DIAGNOSIS — I4892 Unspecified atrial flutter: Secondary | ICD-10-CM

## 2022-05-12 DIAGNOSIS — I442 Atrioventricular block, complete: Secondary | ICD-10-CM | POA: Diagnosis not present

## 2022-05-12 DIAGNOSIS — I10 Essential (primary) hypertension: Secondary | ICD-10-CM | POA: Diagnosis not present

## 2022-05-12 DIAGNOSIS — I5032 Chronic diastolic (congestive) heart failure: Secondary | ICD-10-CM

## 2022-05-12 DIAGNOSIS — Z7901 Long term (current) use of anticoagulants: Secondary | ICD-10-CM

## 2022-05-12 DIAGNOSIS — Z950 Presence of cardiac pacemaker: Secondary | ICD-10-CM

## 2022-05-12 NOTE — Patient Instructions (Addendum)
Medication Instructions:  Your physician recommends that you continue on your current medications as directed. Please refer to the Current Medication list given to you today.  *If you need a refill on your cardiac medications before your next appointment, please call your pharmacy*   Follow-Up: At [facility], you and your health needs are our priority.  As part of our continuing mission to provide you with exceptional heart care, we have created designated Provider Care Teams.  These Care Teams include your primary Cardiologist (physician) and Advanced Practice Providers (APPs -  Physician Assistants and Nurse Practitioners) who all work together to provide you with the care you need, when you need it.   Your next appointment:   We will notify you with appointment  Provider:   Dr. Graciela Husbands  Other Instructions Notify us if your weight drops to 132lbs or less   HOW TO TAKE YOUR BLOOD PRESSURE  Rest 5 minutes before taking your blood pressure. Don't  smoke or drink caffeinated beverages for at least 30 minutes before. Take your blood pressure before (not after) you eat. Sit comfortably with your back supported and both feet on the floor ( don't cross your legs). Elevate your arm to heart level on a table or a desk. Use the proper sized cuff.  It should fit smoothly and snugly around your bare upper arm.  There should be  Enough room to slip a fingertip under the cuff.  The bottom edge of the cuff should be 1 inch above the crease Of the elbow. Please monitor your blood pressure once daily 2 hours after your am medication.   If you blood pressure Consistently remains above 130 (systolic) top number or over 80 ( diastolic) bottom number X 3 days   Or 110 or less (systolic) top number or under 60 ( diastolic) bottom number X 3 days Consecutively.  Please call our office at [phone] or send Mychart message.     ----Avoid cold medicines with D or DM at the end of them----

## 2022-05-15 ENCOUNTER — Inpatient Hospital Stay (HOSPITAL_COMMUNITY)
Admission: EM | Admit: 2022-05-15 | Discharge: 2022-05-18 | DRG: 481 | Disposition: A | Discharge status: Skilled Nursing Facility | Payer: Medicare Other | Attending: Internal Medicine | Admitting: Internal Medicine

## 2022-05-15 ENCOUNTER — Emergency Department (HOSPITAL_COMMUNITY): Payer: Medicare Other

## 2022-05-15 ENCOUNTER — Other Ambulatory Visit: Payer: Self-pay

## 2022-05-15 ENCOUNTER — Encounter (HOSPITAL_COMMUNITY): Payer: Self-pay | Admitting: Family Medicine

## 2022-05-15 DIAGNOSIS — I4892 Unspecified atrial flutter: Secondary | ICD-10-CM | POA: Diagnosis present

## 2022-05-15 DIAGNOSIS — Z7901 Long term (current) use of anticoagulants: Secondary | ICD-10-CM

## 2022-05-15 DIAGNOSIS — Z8249 Family history of ischemic heart disease and other diseases of the circulatory system: Secondary | ICD-10-CM | POA: Diagnosis not present

## 2022-05-15 DIAGNOSIS — F0390 Unspecified dementia without behavioral disturbance: Secondary | ICD-10-CM | POA: Diagnosis present

## 2022-05-15 DIAGNOSIS — D62 Acute posthemorrhagic anemia: Secondary | ICD-10-CM | POA: Diagnosis not present

## 2022-05-15 DIAGNOSIS — E119 Type 2 diabetes mellitus without complications: Secondary | ICD-10-CM | POA: Diagnosis present

## 2022-05-15 DIAGNOSIS — F32A Depression, unspecified: Secondary | ICD-10-CM | POA: Diagnosis present

## 2022-05-15 DIAGNOSIS — Z823 Family history of stroke: Secondary | ICD-10-CM

## 2022-05-15 DIAGNOSIS — Z888 Allergy status to other drugs, medicaments and biological substances status: Secondary | ICD-10-CM

## 2022-05-15 DIAGNOSIS — I10 Essential (primary) hypertension: Secondary | ICD-10-CM | POA: Diagnosis not present

## 2022-05-15 DIAGNOSIS — Z96653 Presence of artificial knee joint, bilateral: Secondary | ICD-10-CM | POA: Diagnosis present

## 2022-05-15 DIAGNOSIS — S72001A Fracture of unspecified part of neck of right femur, initial encounter for closed fracture: Secondary | ICD-10-CM | POA: Diagnosis not present

## 2022-05-15 DIAGNOSIS — I509 Heart failure, unspecified: Secondary | ICD-10-CM | POA: Diagnosis not present

## 2022-05-15 DIAGNOSIS — W19XXXA Unspecified fall, initial encounter: Secondary | ICD-10-CM | POA: Diagnosis present

## 2022-05-15 DIAGNOSIS — Z7984 Long term (current) use of oral hypoglycemic drugs: Secondary | ICD-10-CM | POA: Diagnosis not present

## 2022-05-15 DIAGNOSIS — I442 Atrioventricular block, complete: Secondary | ICD-10-CM | POA: Diagnosis present

## 2022-05-15 DIAGNOSIS — S72141A Displaced intertrochanteric fracture of right femur, initial encounter for closed fracture: Secondary | ICD-10-CM

## 2022-05-15 DIAGNOSIS — Z950 Presence of cardiac pacemaker: Secondary | ICD-10-CM | POA: Diagnosis not present

## 2022-05-15 DIAGNOSIS — E1169 Type 2 diabetes mellitus with other specified complication: Secondary | ICD-10-CM | POA: Diagnosis not present

## 2022-05-15 DIAGNOSIS — I2510 Atherosclerotic heart disease of native coronary artery without angina pectoris: Secondary | ICD-10-CM | POA: Diagnosis present

## 2022-05-15 DIAGNOSIS — Z79899 Other long term (current) drug therapy: Secondary | ICD-10-CM | POA: Diagnosis not present

## 2022-05-15 DIAGNOSIS — I5032 Chronic diastolic (congestive) heart failure: Secondary | ICD-10-CM | POA: Diagnosis present

## 2022-05-15 DIAGNOSIS — Z833 Family history of diabetes mellitus: Secondary | ICD-10-CM | POA: Diagnosis not present

## 2022-05-15 DIAGNOSIS — S72009A Fracture of unspecified part of neck of unspecified femur, initial encounter for closed fracture: Principal | ICD-10-CM | POA: Diagnosis present

## 2022-05-15 DIAGNOSIS — I110 Hypertensive heart disease with heart failure: Secondary | ICD-10-CM | POA: Diagnosis present

## 2022-05-15 DIAGNOSIS — Z87891 Personal history of nicotine dependence: Secondary | ICD-10-CM

## 2022-05-15 DIAGNOSIS — H409 Unspecified glaucoma: Secondary | ICD-10-CM | POA: Diagnosis present

## 2022-05-15 DIAGNOSIS — D72829 Elevated white blood cell count, unspecified: Secondary | ICD-10-CM | POA: Diagnosis present

## 2022-05-15 DIAGNOSIS — E785 Hyperlipidemia, unspecified: Secondary | ICD-10-CM | POA: Diagnosis present

## 2022-05-15 DIAGNOSIS — I480 Paroxysmal atrial fibrillation: Secondary | ICD-10-CM | POA: Diagnosis present

## 2022-05-15 DIAGNOSIS — Z881 Allergy status to other antibiotic agents status: Secondary | ICD-10-CM

## 2022-05-15 LAB — CBC WITH DIFFERENTIAL/PLATELET
Abs Immature Granulocytes: 0.09 10*3/uL — ABNORMAL HIGH (ref 0.00–0.07)
Basophils Absolute: 0.1 10*3/uL (ref 0.0–0.1)
Basophils Relative: 1 %
Eosinophils Absolute: 0.2 10*3/uL (ref 0.0–0.5)
Eosinophils Relative: 2 %
HCT: 37.2 % (ref 36.0–46.0)
Hemoglobin: 12.2 g/dL (ref 12.0–15.0)
Immature Granulocytes: 1 %
Lymphocytes Relative: 15 %
Lymphs Abs: 1.9 10*3/uL (ref 0.7–4.0)
MCH: 29.8 pg (ref 26.0–34.0)
MCHC: 32.8 g/dL (ref 30.0–36.0)
MCV: 91 fL (ref 80.0–100.0)
Monocytes Absolute: 0.7 10*3/uL (ref 0.1–1.0)
Monocytes Relative: 6 %
Neutro Abs: 9.4 10*3/uL — ABNORMAL HIGH (ref 1.7–7.7)
Neutrophils Relative %: 75 %
Platelets: 237 10*3/uL (ref 150–400)
RBC: 4.09 MIL/uL (ref 3.87–5.11)
RDW: 14.8 % (ref 11.5–15.5)
WBC: 12.4 10*3/uL — ABNORMAL HIGH (ref 4.0–10.5)
nRBC: 0 % (ref 0.0–0.2)

## 2022-05-15 LAB — BASIC METABOLIC PANEL
Anion gap: 10 (ref 5–15)
BUN: 13 mg/dL (ref 8–23)
CO2: 27 mmol/L (ref 22–32)
Calcium: 10.6 mg/dL — ABNORMAL HIGH (ref 8.9–10.3)
Chloride: 103 mmol/L (ref 98–111)
Creatinine, Ser: 0.7 mg/dL (ref 0.44–1.00)
GFR, Estimated: >60 mL/min (ref >60)
Glucose, Bld: 198 mg/dL — ABNORMAL HIGH (ref 70–99)
Potassium: 3.6 mmol/L (ref 3.5–5.1)
Sodium: 140 mmol/L (ref 135–145)

## 2022-05-15 LAB — TYPE AND SCREEN
ABO/RH(D): O POS
Antibody Screen: NEGATIVE

## 2022-05-15 LAB — GLUCOSE, CAPILLARY
Glucose-Capillary: 159 mg/dL — ABNORMAL HIGH (ref 70–99)
Glucose-Capillary: 126 mg/dL — ABNORMAL HIGH (ref 70–99)
Glucose-Capillary: 134 mg/dL — ABNORMAL HIGH (ref 70–99)

## 2022-05-15 LAB — PROTIME-INR
INR: 1.6 — ABNORMAL HIGH (ref 0.8–1.2)
Prothrombin Time: 18.8 seconds — ABNORMAL HIGH (ref 11.4–15.2)

## 2022-05-15 MED ORDER — TRAZODONE HCL 50 MG PO TABS
75.0000 mg | ORAL_TABLET | Freq: Every day | ORAL | Status: DC
  Administered 2022-05-17: 75 mg via ORAL
  Filled 2022-05-15 (×2): qty 2

## 2022-05-15 MED ORDER — ACETAMINOPHEN 325 MG PO TABS: 650.0000 mg | ORAL_TABLET | Freq: Four times a day (QID) | ORAL | Status: DC | PRN

## 2022-05-15 MED ORDER — SODIUM CHLORIDE 0.9% FLUSH
3.0000 mL | Freq: Two times a day (BID) | INTRAVENOUS | Status: DC
  Administered 2022-05-15 – 2022-05-18 (×4): 3 mL via INTRAVENOUS

## 2022-05-15 MED ORDER — POLYETHYLENE GLYCOL 3350 17 G PO PACK: 17.0000 g | PACK | Freq: Every day | ORAL | Status: DC | PRN

## 2022-05-15 MED ORDER — SODIUM CHLORIDE 0.9 % IV SOLN: INTRAVENOUS | Status: DC | PRN

## 2022-05-15 MED ORDER — FAMOTIDINE 20 MG PO TABS
20.0000 mg | ORAL_TABLET | Freq: Every day | ORAL | Status: DC
  Administered 2022-05-15 – 2022-05-18 (×3): 20 mg via ORAL
  Filled 2022-05-15 (×3): qty 1

## 2022-05-15 MED ORDER — FUROSEMIDE 40 MG PO TABS
40.0000 mg | ORAL_TABLET | Freq: Every day | ORAL | Status: DC
  Administered 2022-05-15: 40 mg via ORAL
  Filled 2022-05-15: qty 1

## 2022-05-15 MED ORDER — SODIUM CHLORIDE 0.9% FLUSH
3.0000 mL | INTRAVENOUS | Status: DC | PRN
  Administered 2022-05-15: 3 mL via INTRAVENOUS

## 2022-05-15 MED ORDER — POTASSIUM CHLORIDE CRYS ER 20 MEQ PO TBCR
20.0000 meq | EXTENDED_RELEASE_TABLET | Freq: Every day | ORAL | Status: DC
  Administered 2022-05-15 – 2022-05-18 (×3): 20 meq via ORAL
  Filled 2022-05-15 (×3): qty 1

## 2022-05-15 MED ORDER — SODIUM CHLORIDE 0.9% FLUSH
3.0000 mL | Freq: Two times a day (BID) | INTRAVENOUS | Status: DC
  Administered 2022-05-18: 3 mL via INTRAVENOUS

## 2022-05-15 MED ORDER — HYDRALAZINE HCL 20 MG/ML IJ SOLN: 10.0000 mg | Freq: Four times a day (QID) | INTRAMUSCULAR | Status: DC | PRN

## 2022-05-15 MED ORDER — ADULT MULTIVITAMIN W/MINERALS CH
1.0000 | ORAL_TABLET | Freq: Every day | ORAL | Status: DC
  Administered 2022-05-15 – 2022-05-18 (×3): 1 via ORAL
  Filled 2022-05-15 (×3): qty 1

## 2022-05-15 MED ORDER — FENTANYL CITRATE PF 50 MCG/ML IJ SOSY
50.0000 ug | PREFILLED_SYRINGE | INTRAMUSCULAR | Status: AC | PRN
  Administered 2022-05-15 – 2022-05-16 (×2): 50 ug via INTRAVENOUS
  Filled 2022-05-15 (×2): qty 1

## 2022-05-15 MED ORDER — TRAZODONE HCL 50 MG PO TABS: 50.0000 mg | ORAL_TABLET | Freq: Every evening | ORAL | Status: DC | PRN

## 2022-05-15 MED ORDER — INSULIN ASPART 100 UNIT/ML IJ SOLN
0.0000 [IU] | Freq: Three times a day (TID) | INTRAMUSCULAR | Status: DC
  Administered 2022-05-15 – 2022-05-18 (×4): 1 [IU] via SUBCUTANEOUS
  Administered 2022-05-18: 2 [IU] via SUBCUTANEOUS

## 2022-05-15 MED ORDER — ISOSORBIDE MONONITRATE ER 30 MG PO TB24
30.0000 mg | ORAL_TABLET | Freq: Every day | ORAL | Status: DC
  Administered 2022-05-15 – 2022-05-18 (×2): 30 mg via ORAL
  Filled 2022-05-15 (×3): qty 1

## 2022-05-15 MED ORDER — IRBESARTAN 75 MG PO TABS
35.5000 mg | ORAL_TABLET | Freq: Every day | ORAL | Status: DC
  Administered 2022-05-15 – 2022-05-18 (×2): 37.5 mg via ORAL
  Filled 2022-05-15 (×3): qty 1

## 2022-05-15 MED ORDER — METHOCARBAMOL 500 MG PO TABS
500.0000 mg | ORAL_TABLET | Freq: Three times a day (TID) | ORAL | Status: DC
  Administered 2022-05-15 – 2022-05-18 (×8): 500 mg via ORAL
  Filled 2022-05-15 (×9): qty 1

## 2022-05-15 MED ORDER — VITAMIN B-12 1000 MCG PO TABS
1000.0000 ug | ORAL_TABLET | Freq: Every day | ORAL | Status: DC
  Administered 2022-05-15 – 2022-05-18 (×3): 1000 ug via ORAL
  Filled 2022-05-15 (×3): qty 1

## 2022-05-15 MED ORDER — OXYCODONE HCL 5 MG PO TABS
5.0000 mg | ORAL_TABLET | ORAL | Status: DC | PRN
  Administered 2022-05-15: 5 mg via ORAL
  Filled 2022-05-15: qty 1

## 2022-05-15 MED ORDER — ACETAMINOPHEN 650 MG RE SUPP: 650.0000 mg | Freq: Four times a day (QID) | RECTAL | Status: DC | PRN

## 2022-05-15 MED ORDER — BISACODYL 10 MG RE SUPP: 10.0000 mg | Freq: Every day | RECTAL | Status: DC | PRN

## 2022-05-15 MED ORDER — INSULIN ASPART 100 UNIT/ML IJ SOLN: 0.0000 [IU] | Freq: Every day | INTRAMUSCULAR | Status: DC

## 2022-05-15 MED ORDER — SENNOSIDES-DOCUSATE SODIUM 8.6-50 MG PO TABS
2.0000 | ORAL_TABLET | Freq: Two times a day (BID) | ORAL | Status: DC
  Administered 2022-05-15 – 2022-05-18 (×4): 2 via ORAL
  Filled 2022-05-15 (×4): qty 2

## 2022-05-15 NOTE — ED Notes (Signed)
ED TO INPATIENT HANDOFF REPORT  ED Nurse Name and Phone #: Delice Bison, RN  S Name/Age/Gender Brittany [name] 89 y.o. female Room/Bed: APA14/APA14  Code Status   Code Status: Prior  Home/SNF/Other Home Patient oriented to: self Is this baseline? Yes   Triage Complete: Triage complete  Chief Complaint Hip fracture (HCC) [S72.009A]  Triage Note Pt BIB [facility] from home, c/o unwitnessed fall.  Per EMS has possible deformity of right hip. Pt has dementia and cannot answer questions, but is yelling out "ouch ouch" with any movement    50 mcg fentanyl IM given PTA   Allergies Allergies  Allergen Reactions   Ciprofloxacin Other (See Comments)    Severe chest pains   Ace Inhibitors Other (See Comments)    REACTION: cough and sore throat   Cedax [Ceftibuten] Other (See Comments)    Possibly nausea/vomiting, cyanosis, severe headaches   Lipitor [Atorvastatin] Other (See Comments)    REACTION: Myalgia   Plavix [Clopidogrel Bisulfate] Other (See Comments)    Possibly nausea/vomiting, cyanosis, severe headaches     Statins Other (See Comments)    Myalgias, cyanosis    Level of Care/Admitting Diagnosis ED Disposition     ED Disposition  Admit   Condition  --   Comment  Hospital Area: [facility] [[id number]]  Level of Care: Med-Surg [16]  Covid Evaluation: Asymptomatic - no recent exposure (last 10 days) testing not required  Diagnosis: Hip fracture [facility]) [[id number]]  Admitting Physician: Marylyn Ishihara  Attending Physician: Shon Hale Rayden.Massy  Certification:: I certify this patient will need inpatient services for at least 2 midnights  Estimated Length of Stay: 3          B Medical/Surgery History Past Medical History:  Diagnosis Date   Atrial flutter (HCC)    CAD (coronary artery disease)    CHF (congestive heart failure) (HCC)    Complete heart block (HCC)    a. s/p STJ dual chamber PPM   Depression    Glaucoma    Hyperlipidemia     Hypertension    Pacemaker    Type II diabetes mellitus (HCC)    Past Surgical History:  Procedure Laterality Date   BREAST BIOPSY  1991   ELECTROPHYSIOLOGIC STUDY N/A 02/12/2015   cardioversion by Dr Graciela Husbands    EP IMPLANTABLE DEVICE N/A 04/07/2015   a. STJ dual chamber PPM implanted 2006 for complete heart block b. gen change 2017   FEMUR IM NAIL  01/18/2012   Procedure: INTRAMEDULLARY (IM) NAIL FEMORAL;  Surgeon: Senaida Lange, MD;  Location: [facility] OR;  Service: Orthopedics;  Laterality: Left;   INGUINAL HERNIA REPAIR Right 03/22/2022   Procedure: HERNIA REPAIR INGUINAL ADULT;  Surgeon: Lucretia Roers, MD;  Location: [facility];  Service: General;  Laterality: Right;   left total knee replacement Bilateral 1996   TONSILLECTOMY  1937     A IV Location/Drains/Wounds Patient Lines/Drains/Airways Status     Active Line/Drains/Airways     Name Placement date Placement time Site Days   Peripheral IV 05/15/22 20 G 1" Right Antecubital 05/15/22  0719  Antecubital  less than 1            Intake/Output Last 24 hours No intake or output data in the 24 hours ending 05/15/22 0454  Labs/Imaging Results for orders placed or performed during the hospital encounter of 05/15/22 (from the past 48 hour(s))  Basic metabolic panel     Status: Abnormal   Collection Time: 05/15/22  6:37  AM  Result Value Ref Range   Sodium 140 135 - 145 mmol/L   Potassium 3.6 3.5 - 5.1 mmol/L   Chloride 103 98 - 111 mmol/L   CO2 27 22 - 32 mmol/L   Glucose, Bld 198 (H) 70 - 99 mg/dL    Comment: Glucose reference range applies only to samples taken after fasting for at least 8 hours.   BUN 13 8 - 23 mg/dL   Creatinine, Ser 8.75 0.44 - 1.00 mg/dL   Calcium 64.3 (H) 8.9 - 10.3 mg/dL   GFR, Estimated >32 >95 mL/min    Comment: (NOTE) Calculated using the CKD-EPI Creatinine Equation (2021)    Anion gap 10 5 - 15    Comment: Performed at [facility], [address] [place], [place] [address]  CBC with  Differential     Status: Abnormal   Collection Time: 05/15/22  6:37 AM  Result Value Ref Range   WBC 12.4 (H) 4.0 - 10.5 K/uL   RBC 4.09 3.87 - 5.11 MIL/uL   Hemoglobin 12.2 12.0 - 15.0 g/dL   HCT 66.0 63.0 - 16.0 %   MCV 91.0 80.0 - 100.0 fL   MCH 29.8 26.0 - 34.0 pg   MCHC 32.8 30.0 - 36.0 g/dL   RDW 10.9 32.3 - 55.7 %   Platelets 237 150 - 400 K/uL   nRBC 0.0 0.0 - 0.2 %   Neutrophils Relative % 75 %   Neutro Abs 9.4 (H) 1.7 - 7.7 K/uL   Lymphocytes Relative 15 %   Lymphs Abs 1.9 0.7 - 4.0 K/uL   Monocytes Relative 6 %   Monocytes Absolute 0.7 0.1 - 1.0 K/uL   Eosinophils Relative 2 %   Eosinophils Absolute 0.2 0.0 - 0.5 K/uL   Basophils Relative 1 %   Basophils Absolute 0.1 0.0 - 0.1 K/uL   Immature Granulocytes 1 %   Abs Immature Granulocytes 0.09 (H) 0.00 - 0.07 K/uL    Comment: Performed at [facility], [address] [place], [place] [address]  Protime-INR     Status: Abnormal   Collection Time: 05/15/22  6:37 AM  Result Value Ref Range   Prothrombin Time 18.8 (H) 11.4 - 15.2 seconds   INR 1.6 (H) 0.8 - 1.2    Comment: (NOTE) INR goal varies based on device and disease states. Performed at [facility], [address], [place], [place] [address]   Type and screen [facility]     Status: None   Collection Time: 05/15/22  6:37 AM  Result Value Ref Range   ABO/RH(D) O POS    Antibody Screen NEG    Sample Expiration      05/18/2022,2359 Performed at [facility], [address] [place], [place] [address]    DG Hip Unilat With Pelvis 2-3 Views Right  Result Date: 05/15/2022 CLINICAL DATA:  89 year old female status post unwitnessed fall. EXAM: DG HIP (WITH OR WITHOUT PELVIS) 2-3V RIGHT COMPARISON:  Pelvis CT 03/19/2022. FINDINGS: Chronic left femur ORIF with mostly visible intramedullary rod, proximal interlocking dynamic hip screw and distal interlocking cortical screw. Grossly intact proximal left femur, hardware. Pelvis osteopenia. No acute pelvis fracture  identified. Acute comminuted and impacted right femur intertrochanteric fracture. Varus impaction, right femoral head appears to remain normally located within the acetabulum. Calcified femoral artery atherosclerosis. Negative visible bowel gas. IMPRESSION: 1. Acute comminuted Right femur intertrochanteric fracture with varus impaction. 2. Chronic left femur ORIF. No acute pelvis fracture identified. Electronically Signed  By: Odessa Fleming M.D.   On: 05/15/2022 07:38   DG Chest 1 View  Result Date: 05/15/2022 CLINICAL DATA:  89 year old female status post unwitnessed fall. EXAM: CHEST  1 VIEW COMPARISON:  Cervical spine CT today.  Portable chest 02/28/2022. FINDINGS: Portable AP supine view at 0657 hours. Larger lung volumes. Stable left chest dual lead cardiac pacemaker. Stable cardiomegaly and mediastinal contours. Visualized tracheal air column is within normal limits. Allowing for portable technique the lungs are clear; calcified left lower lung granuloma. Osteopenia. No acute osseous abnormality identified. Paucity of bowel gas in the upper abdomen. IMPRESSION: 1. No acute cardiopulmonary abnormality or acute traumatic injury identified. 2. Stable cardiomegaly.  Left chest pacemaker. Electronically Signed   By: Odessa Fleming M.D.   On: 05/15/2022 07:37   CT CERVICAL SPINE WO CONTRAST  Result Date: 05/15/2022 CLINICAL DATA:  89 year old female status post unwitnessed fall. EXAM: CT CERVICAL SPINE WITHOUT CONTRAST TECHNIQUE: Multidetector CT imaging of the cervical spine was performed without intravenous contrast. Multiplanar CT image reconstructions were also generated. RADIATION DOSE REDUCTION: This exam was performed according to the departmental dose-optimization program which includes automated exposure control, adjustment of the mA and/or kV according to patient size and/or use of iterative reconstruction technique. COMPARISON:  Head CT today. FINDINGS: Alignment: Cervicothoracic junction alignment is within  normal limits. Straightening of cervical lordosis above C7. Bilateral posterior element alignment is within normal limits. Skull base and vertebrae: Osteopenia. Mild skull base motion artifact. Visualized skull base is intact. No atlanto-occipital dissociation. C1 and C2 appear intact and aligned. No acute osseous abnormality identified. Soft tissues and spinal canal: No prevertebral fluid or swelling. No visible canal hematoma. Partially retropharyngeal carotid arteries with mild for age calcified atherosclerosis. Disc levels: C5-C6 acquired appearing interbody ankylosis. Mild for age cervical spine degeneration elsewhere, and capacious appearance of the cervical spinal canal. Upper chest: Osteopenia. Grossly intact visible upper thoracic levels. Left chest cardiac pacemaker type device. Lungs appear clear with mild respiratory motion. Calcified aortic atherosclerosis. IMPRESSION: 1. No acute traumatic injury identified in the cervical spine. 2. Mild for age cervical spine degeneration. Acquired C5-C6 ankylosis. 3.  Aortic Atherosclerosis (ICD10-I70.0). Electronically Signed   By: Odessa Fleming M.D.   On: 05/15/2022 07:36   CT HEAD WO CONTRAST  Result Date: 05/15/2022 CLINICAL DATA:  89 year old female status post unwitnessed fall. Dementia. EXAM: CT HEAD WITHOUT CONTRAST TECHNIQUE: Contiguous axial images were obtained from the base of the skull through the vertex without intravenous contrast. RADIATION DOSE REDUCTION: This exam was performed according to the departmental dose-optimization program which includes automated exposure control, adjustment of the mA and/or kV according to patient size and/or use of iterative reconstruction technique. COMPARISON:  Head CT 02/28/2022. FINDINGS: Brain: Stable cerebral volume, chronic temporal lobe atrophy. No midline shift, ventriculomegaly, mass effect, evidence of mass lesion, intracranial hemorrhage or evidence of cortically based acute infarction. Gray-white  differentiation is stable and within normal limits for age. Vascular: Calcified atherosclerosis at the skull base. No suspicious intracranial vascular hyperdensity. Skull: Hyperostosis of the calvarium. No acute osseous abnormality identified. Sinuses/Orbits: Visualized paranasal sinuses and mastoids are stable and well aerated. Other: Orbit and scalp soft tissues appears stable. IMPRESSION: 1. No acute intracranial abnormality or acute traumatic injury identified. 2. Chronic temporal lobe atrophy.  Cerebral Atrophy (ICD10-G31.9). Electronically Signed   By: Odessa Fleming M.D.   On: 05/15/2022 07:16    Pending Labs Unresulted Labs (From admission, onward)    None  Vitals/Pain Today's Vitals   05/15/22 0628 05/15/22 0629 05/15/22 0735 05/15/22 0810  BP:  (!) 167/76 (!) 187/71 (!) 148/72  Pulse: 70 73 74 73  Resp: 12 (!) 22 17 15   Temp:  97.9 F (36.6 C)    TempSrc:  Oral    SpO2: 94% 97% 98% 92%  Weight:      Height:        Isolation Precautions No active isolations  Medications Medications  fentaNYL (SUBLIMAZE) injection 50 mcg (50 mcg Intravenous Given 05/15/22 0719)  insulin aspart (novoLOG) injection 0-6 Units (has no administration in time range)  insulin aspart (novoLOG) injection 0-5 Units (has no administration in time range)  oxyCODONE (Oxy IR/ROXICODONE) immediate release tablet 5 mg (has no administration in time range)  methocarbamol (ROBAXIN) tablet 500 mg (500 mg Oral Given 05/15/22 0901)    Mobility walks with device     Focused Assessments Cardiac Assessment Handoff:    Lab Results  Component Value Date   TROPONINI <0.03 01/05/2015   No results found for: "DDIMER" Does the Patient currently have chest pain? No    R Recommendations: See Admitting Provider Note  Report given to:   Additional Notes:

## 2022-05-15 NOTE — H&P (Addendum)
Patient Demographics:    Brittany [name], is a 89 y.o. female  MRN: [id number]   DOB - [birth year]  Admit Date - 05/15/2022  Outpatient Primary MD for the patient is Tracey Harries, MD   Assessment & Plan:   Assessment and Plan:  1)Rt Hip Fx--- Status post fall with right hip fracture --CT C-spine and CT head without acute findings -Last dose of Eliquis 05/13/2020 4 PM dose -Plan for ORIF by Dr. Romeo Apple on 05/16/2022 -IV and oral pain medications as ordered  2)Leukocytosis--- WBCs 12.4.... Suspect this is reactive after fall and acute hip fracture -Chest ray without infectious findings -No urinary symptoms  3) social/ethics--- plan of care and advanced directive discussed with HCPOA/ patient's daughter Ms. [name] at bedside -Despite previous DNR status - she would like to leave patient full code at this time  4)PAF/paroxysmal atrial flutter/H/o CHB--s/p [facility] pacemaker placement previously --- Eliquis on hold due to #1 above -Rate controlled without negative chronotropic agents  5)DM2-last A1c 5.9 reflecting excellent diabetic control PTA -Hold metformin Use Novolog/Humalog Sliding scale insulin with Accu-Cheks/Fingersticks as ordered   6)HTN/CAD--prior balloon angioplasty LAD in 2007 --okay to restart isosorbide and Avapro --May use IV hydralazine as needed elevated BP  7)Dementia--- stable, patient with significant cognitive and memory deficits, supportive care advised  8)HFpEF/chronic diastolic dysfunction CHF--- echo from 03/20/2022 with EF of 60 to 65%, no aortic stenosis -Patient had mild mitral stenosis -Continue Lasix 40 mg daily  Status is: Inpatient  Remains inpatient appropriate because:   Dispo: The patient is from: Home              Anticipated d/c is to: Home with [facility]               Anticipated d/c date is: 3 days              Patient currently is not medically stable to d/c. Barriers: Not Clinically Stable-   With History of - Reviewed by me  Past Medical History:  Diagnosis Date   Atrial flutter (HCC)    CAD (coronary artery disease)    CHF (congestive heart failure) (HCC)    Complete heart block (HCC)    a. s/p STJ dual chamber PPM   Depression    Glaucoma    Hyperlipidemia    Hypertension    Pacemaker    Type II diabetes mellitus (HCC)       Past Surgical History:  Procedure Laterality Date   BREAST BIOPSY  1991   ELECTROPHYSIOLOGIC STUDY N/A 02/12/2015   cardioversion by Dr Graciela Husbands    EP IMPLANTABLE DEVICE N/A 04/07/2015   a. STJ dual chamber PPM implanted 2006 for complete heart block b. gen change 2017   FEMUR IM NAIL  01/18/2012   Procedure: INTRAMEDULLARY (IM) NAIL FEMORAL;  Surgeon: Senaida Lange, MD;  Location: [facility] OR;  Service: Orthopedics;  Laterality: Left;   INGUINAL HERNIA REPAIR Right 03/22/2022  Procedure: HERNIA REPAIR INGUINAL ADULT;  Surgeon: Lucretia Roers, MD;  Location: [facility] ORS;  Service: General;  Laterality: Right;   left total knee replacement Bilateral 1996   TONSILLECTOMY  1937   Chief Complaint  Patient presents with   Fall     HPI:    Toluwani Sailer  is a 89 y.o. female with past medical history relevant for CAD, AF paroxysmal atrial flutter/A-fib on chronic anticoagulation with Eliquis, history of complete heart block with prior [facility] pacemaker placement, HTN, diastolic dysfunction CHF, DM2 presents to the ED by EMS with concerns for unwitnessed fall and right right hip pain and right lower extremity deformity---- -patient has dementia and is a poor historian -History obtained from patient's daughter Ms. [name] at bedside -No Fevers no emesis no diarrhea -Denies chest pains -Patient lives with her daughter -Hip x-ray is consistent with acute comminuted right femoral intertrochanteric fracture with valgus  impaction -chest x-rays without acute findings -CT C-spine and CT head without acute findings -INR 1.6 -WBCs 12.4 hemoglobin 12.2, platelets 237 -Creatinine 0.7 -Sodium is 148 potassium is 3.6   Review of systems:    In addition to the HPI above,   A full Review of  Systems was done, all other systems reviewed are negative except as noted above in HPI , .   Social History:  Reviewed by me   Social History   Tobacco Use   Smoking status: Former    Types: Cigarettes    Quit date: 01/17/1951    Years since quitting: 71.3   Smokeless tobacco: Never  Substance Use Topics   Alcohol use: Not Currently    Comment: Occasional glass of wine     Family History :  Reviewed by me    Family History  Problem Relation Age of Onset   Diabetes Mother    Heart attack Father    Stroke Father    Hypertension Father    Kidney disease Daughter     Home Medications:   Prior to Admission medications   Medication Sig Start Date End Date Taking? Authorizing Provider  cyanocobalamin 1000 MCG tablet Take 1,000 mcg by mouth daily.   Yes [provider]  ELIQUIS 5 MG TABS tablet TAKE 1 TABLET TWICE A DAY 09/05/17  Yes Duke Salvia, MD  famotidine (PEPCID) 20 MG tablet Take 1 tablet (20 mg total) by mouth daily. 04/24/22  Yes Tiffany Kocher, PA-C  furosemide (LASIX) 40 MG tablet Take 1 tablet (40 mg total) by mouth daily. 03/02/22  Yes Madylyn Insco, MD  irbesartan (AVAPRO) 75 MG tablet Take 0.5 tablets (37.5 mg total) by mouth daily. 04/12/17  Yes Seiler, Amber K, NP  isosorbide mononitrate (IMDUR) 30 MG 24 hr tablet Take 1 tablet by mouth daily. 09/04/18  Yes [provider]  metFORMIN (GLUCOPHAGE-XR) 500 MG 24 hr tablet Take 500 mg by mouth daily with breakfast. 12/10/14  Yes [provider]  Multiple Vitamins-Minerals (MULTIVITAMIN WOMEN 50+) TABS Take 1 tablet by mouth daily.   Yes [provider]  potassium chloride SA (KLOR-CON M) 20 MEQ tablet Take 20  mEq by mouth daily. 03/14/22  Yes [provider]  senna-docusate (SENOKOT-S) 8.6-50 MG tablet Take 2 tablets by mouth 2 (two) times daily. 03/02/22 03/02/23 Yes Shon Hale, MD  liver oil-zinc oxide (DESITIN) 40 % ointment Apply topically 2 (two) times daily. Patient not taking: Reported on 05/12/2022 03/24/22   [place], Alvira Philips, DO  ondansetron (ZOFRAN) 8 MG tablet  Take by mouth. Patient not taking: Reported on 05/12/2022 04/11/22   [provider]     Allergies:     Allergies  Allergen Reactions   Ciprofloxacin Other (See Comments)    Severe chest pains   Ace Inhibitors Other (See Comments)    REACTION: cough and sore throat   Cedax [Ceftibuten] Other (See Comments)    Possibly nausea/vomiting, cyanosis, severe headaches   Lipitor [Atorvastatin] Other (See Comments)    REACTION: Myalgia   Plavix [Clopidogrel Bisulfate] Other (See Comments)    Possibly nausea/vomiting, cyanosis, severe headaches     Statins Other (See Comments)    Myalgias, cyanosis     Physical Exam:  Vitals  Blood pressure (!) 163/58, pulse 72, temperature 98.7 F (37.1 C), temperature source Oral, resp. rate 18, height 5\' 2"  (1.575 m), weight 64 kg, SpO2 100 %.  Physical Examination: General appearance - alert,  in no distress, pleasantly confused Mental status -memory and cognitive deficits, pleasantly confused and disoriented Eyes - sclera anicteric Neck - supple, no JVD elevation , Chest - clear  to auscultation bilaterally, symmetrical air movement,  Heart - S1 and S2 normal, regular , pacemaker in situ Abdomen - soft, nontender, nondistended, +BS Neurological -Limited movement of right lower extremity due to pain otherwise no new focal deficits Extremities - no pedal edema noted, intact peripheral pulses  Skin - warm, dry   Data Review:    CBC Recent Labs  Lab 05/15/22 0637  WBC 12.4*  HGB 12.2  HCT 37.2  PLT 237  MCV 91.0  MCH 29.8  MCHC 32.8  RDW 14.8  LYMPHSABS 1.9   MONOABS 0.7  EOSABS 0.2  BASOSABS 0.1   ------------------------------------------------------------------------------------------------------------------  Chemistries  Recent Labs  Lab 05/15/22 0637  NA 140  K 3.6  CL 103  CO2 27  GLUCOSE 198*  BUN 13  CREATININE 0.70  CALCIUM 10.6*   ------------------------------------------------------------------------------------------------------------------ estimated creatinine clearance is 38.6 mL/min (by C-G formula based on SCr of 0.7 mg/dL). ------------------------------------------------------------------------------------------------------------------ Coagulation profile Recent Labs  Lab 05/15/22 0637  INR 1.6*   ----------------------------------------------------- ------------------------------------------------------------------------------------------------------------------    Component Value Date/Time   BNP 232.3 (H) 01/02/2015 1533    Urinalysis    Component Value Date/Time   COLORURINE AMBER (A) 03/01/2022 1831   APPEARANCEUR CLOUDY (A) 03/01/2022 1831   LABSPEC 1.017 03/01/2022 1831   PHURINE 8.0 03/01/2022 1831   GLUCOSEU NEGATIVE 03/01/2022 1831   HGBUR NEGATIVE 03/01/2022 1831   BILIRUBINUR NEGATIVE 03/01/2022 1831   KETONESUR NEGATIVE 03/01/2022 1831   PROTEINUR 30 (A) 03/01/2022 1831   NITRITE NEGATIVE 03/01/2022 1831   LEUKOCYTESUR SMALL (A) 03/01/2022 1831    Imaging Results:    DG Hip Unilat With Pelvis 2-3 Views Right  Result Date: 05/15/2022 CLINICAL DATA:  89 year old female status post unwitnessed fall. EXAM: DG HIP (WITH OR WITHOUT PELVIS) 2-3V RIGHT COMPARISON:  Pelvis CT 03/19/2022. FINDINGS: Chronic left femur ORIF with mostly visible intramedullary rod, proximal interlocking dynamic hip screw and distal interlocking cortical screw. Grossly intact proximal left femur, hardware. Pelvis osteopenia. No acute pelvis fracture identified. Acute comminuted and impacted right femur  intertrochanteric fracture. Varus impaction, right femoral head appears to remain normally located within the acetabulum. Calcified femoral artery atherosclerosis. Negative visible bowel gas. IMPRESSION: 1. Acute comminuted Right femur intertrochanteric fracture with varus impaction. 2. Chronic left femur ORIF. No acute pelvis fracture identified. Electronically Signed   By: Odessa Fleming M.D.   On: 05/15/2022 07:38   DG Chest 1 View  Result Date: 05/15/2022 CLINICAL DATA:  89 year old female status post unwitnessed fall. EXAM: CHEST  1 VIEW COMPARISON:  Cervical spine CT today.  Portable chest 02/28/2022. FINDINGS: Portable AP supine view at 0657 hours. Larger lung volumes. Stable left chest dual lead cardiac pacemaker. Stable cardiomegaly and mediastinal contours. Visualized tracheal air column is within normal limits. Allowing for portable technique the lungs are clear; calcified left lower lung granuloma. Osteopenia. No acute osseous abnormality identified. Paucity of bowel gas in the upper abdomen. IMPRESSION: 1. No acute cardiopulmonary abnormality or acute traumatic injury identified. 2. Stable cardiomegaly.  Left chest pacemaker. Electronically Signed   By: Odessa Fleming M.D.   On: 05/15/2022 07:37   CT CERVICAL SPINE WO CONTRAST  Result Date: 05/15/2022 CLINICAL DATA:  89 year old female status post unwitnessed fall. EXAM: CT CERVICAL SPINE WITHOUT CONTRAST TECHNIQUE: Multidetector CT imaging of the cervical spine was performed without intravenous contrast. Multiplanar CT image reconstructions were also generated. RADIATION DOSE REDUCTION: This exam was performed according to the departmental dose-optimization program which includes automated exposure control, adjustment of the mA and/or kV according to patient size and/or use of iterative reconstruction technique. COMPARISON:  Head CT today. FINDINGS: Alignment: Cervicothoracic junction alignment is within normal limits. Straightening of cervical lordosis above  C7. Bilateral posterior element alignment is within normal limits. Skull base and vertebrae: Osteopenia. Mild skull base motion artifact. Visualized skull base is intact. No atlanto-occipital dissociation. C1 and C2 appear intact and aligned. No acute osseous abnormality identified. Soft tissues and spinal canal: No prevertebral fluid or swelling. No visible canal hematoma. Partially retropharyngeal carotid arteries with mild for age calcified atherosclerosis. Disc levels: C5-C6 acquired appearing interbody ankylosis. Mild for age cervical spine degeneration elsewhere, and capacious appearance of the cervical spinal canal. Upper chest: Osteopenia. Grossly intact visible upper thoracic levels. Left chest cardiac pacemaker type device. Lungs appear clear with mild respiratory motion. Calcified aortic atherosclerosis. IMPRESSION: 1. No acute traumatic injury identified in the cervical spine. 2. Mild for age cervical spine degeneration. Acquired C5-C6 ankylosis. 3.  Aortic Atherosclerosis (ICD10-I70.0). Electronically Signed   By: Odessa Fleming M.D.   On: 05/15/2022 07:36   CT HEAD WO CONTRAST  Result Date: 05/15/2022 CLINICAL DATA:  89 year old female status post unwitnessed fall. Dementia. EXAM: CT HEAD WITHOUT CONTRAST TECHNIQUE: Contiguous axial images were obtained from the base of the skull through the vertex without intravenous contrast. RADIATION DOSE REDUCTION: This exam was performed according to the departmental dose-optimization program which includes automated exposure control, adjustment of the mA and/or kV according to patient size and/or use of iterative reconstruction technique. COMPARISON:  Head CT 02/28/2022. FINDINGS: Brain: Stable cerebral volume, chronic temporal lobe atrophy. No midline shift, ventriculomegaly, mass effect, evidence of mass lesion, intracranial hemorrhage or evidence of cortically based acute infarction. Gray-white differentiation is stable and within normal limits for age. Vascular:  Calcified atherosclerosis at the skull base. No suspicious intracranial vascular hyperdensity. Skull: Hyperostosis of the calvarium. No acute osseous abnormality identified. Sinuses/Orbits: Visualized paranasal sinuses and mastoids are stable and well aerated. Other: Orbit and scalp soft tissues appears stable. IMPRESSION: 1. No acute intracranial abnormality or acute traumatic injury identified. 2. Chronic temporal lobe atrophy.  Cerebral Atrophy (ICD10-G31.9). Electronically Signed   By: Odessa Fleming M.D.   On: 05/15/2022 07:16    Radiological Exams on Admission: DG Hip Unilat With Pelvis 2-3 Views Right  Result Date: 05/15/2022 CLINICAL DATA:  89 year old female status post unwitnessed fall. EXAM: DG HIP (WITH OR WITHOUT PELVIS) 2-3V RIGHT  COMPARISON:  Pelvis CT 03/19/2022. FINDINGS: Chronic left femur ORIF with mostly visible intramedullary rod, proximal interlocking dynamic hip screw and distal interlocking cortical screw. Grossly intact proximal left femur, hardware. Pelvis osteopenia. No acute pelvis fracture identified. Acute comminuted and impacted right femur intertrochanteric fracture. Varus impaction, right femoral head appears to remain normally located within the acetabulum. Calcified femoral artery atherosclerosis. Negative visible bowel gas. IMPRESSION: 1. Acute comminuted Right femur intertrochanteric fracture with varus impaction. 2. Chronic left femur ORIF. No acute pelvis fracture identified. Electronically Signed   By: Odessa Fleming M.D.   On: 05/15/2022 07:38   DG Chest 1 View  Result Date: 05/15/2022 CLINICAL DATA:  89 year old female status post unwitnessed fall. EXAM: CHEST  1 VIEW COMPARISON:  Cervical spine CT today.  Portable chest 02/28/2022. FINDINGS: Portable AP supine view at 0657 hours. Larger lung volumes. Stable left chest dual lead cardiac pacemaker. Stable cardiomegaly and mediastinal contours. Visualized tracheal air column is within normal limits. Allowing for portable technique  the lungs are clear; calcified left lower lung granuloma. Osteopenia. No acute osseous abnormality identified. Paucity of bowel gas in the upper abdomen. IMPRESSION: 1. No acute cardiopulmonary abnormality or acute traumatic injury identified. 2. Stable cardiomegaly.  Left chest pacemaker. Electronically Signed   By: Odessa Fleming M.D.   On: 05/15/2022 07:37   CT CERVICAL SPINE WO CONTRAST  Result Date: 05/15/2022 CLINICAL DATA:  89 year old female status post unwitnessed fall. EXAM: CT CERVICAL SPINE WITHOUT CONTRAST TECHNIQUE: Multidetector CT imaging of the cervical spine was performed without intravenous contrast. Multiplanar CT image reconstructions were also generated. RADIATION DOSE REDUCTION: This exam was performed according to the departmental dose-optimization program which includes automated exposure control, adjustment of the mA and/or kV according to patient size and/or use of iterative reconstruction technique. COMPARISON:  Head CT today. FINDINGS: Alignment: Cervicothoracic junction alignment is within normal limits. Straightening of cervical lordosis above C7. Bilateral posterior element alignment is within normal limits. Skull base and vertebrae: Osteopenia. Mild skull base motion artifact. Visualized skull base is intact. No atlanto-occipital dissociation. C1 and C2 appear intact and aligned. No acute osseous abnormality identified. Soft tissues and spinal canal: No prevertebral fluid or swelling. No visible canal hematoma. Partially retropharyngeal carotid arteries with mild for age calcified atherosclerosis. Disc levels: C5-C6 acquired appearing interbody ankylosis. Mild for age cervical spine degeneration elsewhere, and capacious appearance of the cervical spinal canal. Upper chest: Osteopenia. Grossly intact visible upper thoracic levels. Left chest cardiac pacemaker type device. Lungs appear clear with mild respiratory motion. Calcified aortic atherosclerosis. IMPRESSION: 1. No acute traumatic  injury identified in the cervical spine. 2. Mild for age cervical spine degeneration. Acquired C5-C6 ankylosis. 3.  Aortic Atherosclerosis (ICD10-I70.0). Electronically Signed   By: Odessa Fleming M.D.   On: 05/15/2022 07:36   CT HEAD WO CONTRAST  Result Date: 05/15/2022 CLINICAL DATA:  89 year old female status post unwitnessed fall. Dementia. EXAM: CT HEAD WITHOUT CONTRAST TECHNIQUE: Contiguous axial images were obtained from the base of the skull through the vertex without intravenous contrast. RADIATION DOSE REDUCTION: This exam was performed according to the departmental dose-optimization program which includes automated exposure control, adjustment of the mA and/or kV according to patient size and/or use of iterative reconstruction technique. COMPARISON:  Head CT 02/28/2022. FINDINGS: Brain: Stable cerebral volume, chronic temporal lobe atrophy. No midline shift, ventriculomegaly, mass effect, evidence of mass lesion, intracranial hemorrhage or evidence of cortically based acute infarction. Gray-white differentiation is stable and within normal limits for age. Vascular: Calcified atherosclerosis  at the skull base. No suspicious intracranial vascular hyperdensity. Skull: Hyperostosis of the calvarium. No acute osseous abnormality identified. Sinuses/Orbits: Visualized paranasal sinuses and mastoids are stable and well aerated. Other: Orbit and scalp soft tissues appears stable. IMPRESSION: 1. No acute intracranial abnormality or acute traumatic injury identified. 2. Chronic temporal lobe atrophy.  Cerebral Atrophy (ICD10-G31.9). Electronically Signed   By: Odessa Fleming M.D.   On: 05/15/2022 07:16    DVT Prophylaxis -SCD  / AM Labs Ordered, also please review Full Orders  Family Communication: Admission, patients condition and plan of care including tests being ordered have been discussed with the patient and daughter  Mrs [name] who indicate understanding and agree with the plan   Condition   -stable  Shon Hale M.D on 05/15/2022 at 6:03 PM Go to [email or web address] -  for contact info  [facility] - Office  [phone]

## 2022-05-15 NOTE — ED Triage Notes (Signed)
Pt BIB [facility] from home, c/o unwitnessed fall.  Per EMS has possible deformity of right hip. Pt has dementia and cannot answer questions, but is yelling out "ouch ouch" with any movement    50 mcg fentanyl IM given PTA

## 2022-05-15 NOTE — ED Provider Notes (Signed)
[place] EMERGENCY DEPARTMENT AT [facility] Provider Note   CSN: [id number] Arrival date & time: 05/15/22  [id number]     History {Add pertinent medical, surgical, social history, OB history to HPI:1} Chief Complaint  Patient presents with   Brittany [name] is a 89 y.o. female.  Patient has a history of coronary artery disease diabetes hypertension and has a pacemaker and is on Eliquis.  Patient fell last night and complains of right hip pain   Fall       Home Medications Prior to Admission medications   Medication Sig Start Date End Date Taking? Authorizing Provider  cyanocobalamin 1000 MCG tablet Take 1,000 mcg by mouth daily.   Yes [provider]  ELIQUIS 5 MG TABS tablet TAKE 1 TABLET TWICE A DAY 09/05/17  Yes Duke Salvia, MD  famotidine (PEPCID) 20 MG tablet Take 1 tablet (20 mg total) by mouth daily. 04/24/22  Yes Tiffany Kocher, PA-C  furosemide (LASIX) 40 MG tablet Take 1 tablet (40 mg total) by mouth daily. 03/02/22  Yes Emokpae, Courage, MD  irbesartan (AVAPRO) 75 MG tablet Take 0.5 tablets (37.5 mg total) by mouth daily. 04/12/17  Yes Seiler, Amber K, NP  isosorbide mononitrate (IMDUR) 30 MG 24 hr tablet Take 1 tablet by mouth daily. 09/04/18  Yes [provider]  metFORMIN (GLUCOPHAGE-XR) 500 MG 24 hr tablet Take 500 mg by mouth daily with breakfast. 12/10/14  Yes [provider]  Multiple Vitamins-Minerals (MULTIVITAMIN WOMEN 50+) TABS Take 1 tablet by mouth daily.   Yes [provider]  potassium chloride SA (KLOR-CON M) 20 MEQ tablet Take 20 mEq by mouth daily. 03/14/22  Yes [provider]  senna-docusate (SENOKOT-S) 8.6-50 MG tablet Take 2 tablets by mouth 2 (two) times daily. 03/02/22 03/02/23 Yes Shon Hale, MD  liver oil-zinc oxide (DESITIN) 40 % ointment Apply topically 2 (two) times daily. Patient not taking: Reported on 05/12/2022 03/24/22   [place], Alvira Philips, DO  ondansetron (ZOFRAN) 8 MG tablet  Take by mouth. Patient not taking: Reported on 05/12/2022 04/11/22   [provider]      Allergies    Ciprofloxacin, Ace inhibitors, Cedax [ceftibuten], Lipitor [atorvastatin], Plavix [clopidogrel bisulfate], and Statins    Review of Systems   Review of Systems  Physical Exam Updated Vital Signs BP (!) 148/72   Pulse 73   Temp 97.9 F (36.6 C) (Oral)   Resp 15   Ht 5\' 2"  (1.575 m)   Wt 64 kg   SpO2 92%   BMI 25.81 kg/m  Physical Exam  ED Results / Procedures / Treatments   Labs (all labs ordered are listed, but only abnormal results are displayed) Labs Reviewed  BASIC METABOLIC PANEL - Abnormal; Notable for the following components:      Result Value   Glucose, Bld 198 (*)    Calcium 10.6 (*)    All other components within normal limits  CBC WITH DIFFERENTIAL/PLATELET - Abnormal; Notable for the following components:   WBC 12.4 (*)    Neutro Abs 9.4 (*)    Abs Immature Granulocytes 0.09 (*)    All other components within normal limits  PROTIME-INR - Abnormal; Notable for the following components:   Prothrombin Time 18.8 (*)    INR 1.6 (*)    All other components within normal limits  TYPE AND SCREEN    EKG EKG Interpretation  Date/Time:  Monday May 15 2022 06:28:57 EDT Ventricular Rate:  68 PR Interval:    QRS Duration: 175 QT Interval:  475 QTC Calculation: 506 R Axis:   228 Text Interpretation: paced rhythm Confirmed by Zadie Rhine ([id number]) on 05/15/2022 6:48:03 AM  Radiology DG Hip Unilat With Pelvis 2-3 Views Right  Result Date: 05/15/2022 CLINICAL DATA:  89 year old female status post unwitnessed fall. EXAM: DG HIP (WITH OR WITHOUT PELVIS) 2-3V RIGHT COMPARISON:  Pelvis CT 03/19/2022. FINDINGS: Chronic left femur ORIF with mostly visible intramedullary rod, proximal interlocking dynamic hip screw and distal interlocking cortical screw. Grossly intact proximal left femur, hardware. Pelvis osteopenia. No acute pelvis fracture identified. Acute  comminuted and impacted right femur intertrochanteric fracture. Varus impaction, right femoral head appears to remain normally located within the acetabulum. Calcified femoral artery atherosclerosis. Negative visible bowel gas. IMPRESSION: 1. Acute comminuted Right femur intertrochanteric fracture with varus impaction. 2. Chronic left femur ORIF. No acute pelvis fracture identified. Electronically Signed   By: Odessa Fleming M.D.   On: 05/15/2022 07:38   DG Chest 1 View  Result Date: 05/15/2022 CLINICAL DATA:  89 year old female status post unwitnessed fall. EXAM: CHEST  1 VIEW COMPARISON:  Cervical spine CT today.  Portable chest 02/28/2022. FINDINGS: Portable AP supine view at 0657 hours. Larger lung volumes. Stable left chest dual lead cardiac pacemaker. Stable cardiomegaly and mediastinal contours. Visualized tracheal air column is within normal limits. Allowing for portable technique the lungs are clear; calcified left lower lung granuloma. Osteopenia. No acute osseous abnormality identified. Paucity of bowel gas in the upper abdomen. IMPRESSION: 1. No acute cardiopulmonary abnormality or acute traumatic injury identified. 2. Stable cardiomegaly.  Left chest pacemaker. Electronically Signed   By: Odessa Fleming M.D.   On: 05/15/2022 07:37   CT CERVICAL SPINE WO CONTRAST  Result Date: 05/15/2022 CLINICAL DATA:  89 year old female status post unwitnessed fall. EXAM: CT CERVICAL SPINE WITHOUT CONTRAST TECHNIQUE: Multidetector CT imaging of the cervical spine was performed without intravenous contrast. Multiplanar CT image reconstructions were also generated. RADIATION DOSE REDUCTION: This exam was performed according to the departmental dose-optimization program which includes automated exposure control, adjustment of the mA and/or kV according to patient size and/or use of iterative reconstruction technique. COMPARISON:  Head CT today. FINDINGS: Alignment: Cervicothoracic junction alignment is within normal limits.  Straightening of cervical lordosis above C7. Bilateral posterior element alignment is within normal limits. Skull base and vertebrae: Osteopenia. Mild skull base motion artifact. Visualized skull base is intact. No atlanto-occipital dissociation. C1 and C2 appear intact and aligned. No acute osseous abnormality identified. Soft tissues and spinal canal: No prevertebral fluid or swelling. No visible canal hematoma. Partially retropharyngeal carotid arteries with mild for age calcified atherosclerosis. Disc levels: C5-C6 acquired appearing interbody ankylosis. Mild for age cervical spine degeneration elsewhere, and capacious appearance of the cervical spinal canal. Upper chest: Osteopenia. Grossly intact visible upper thoracic levels. Left chest cardiac pacemaker type device. Lungs appear clear with mild respiratory motion. Calcified aortic atherosclerosis. IMPRESSION: 1. No acute traumatic injury identified in the cervical spine. 2. Mild for age cervical spine degeneration. Acquired C5-C6 ankylosis. 3.  Aortic Atherosclerosis (ICD10-I70.0). Electronically Signed   By: Odessa Fleming M.D.   On: 05/15/2022 07:36   CT HEAD WO CONTRAST  Result Date: 05/15/2022 CLINICAL DATA:  89 year old female status post unwitnessed fall. Dementia. EXAM: CT HEAD WITHOUT CONTRAST TECHNIQUE: Contiguous axial images were obtained from the base of the skull through the vertex without intravenous contrast. RADIATION DOSE REDUCTION: This exam was performed according to the departmental dose-optimization program which  includes automated exposure control, adjustment of the mA and/or kV according to patient size and/or use of iterative reconstruction technique. COMPARISON:  Head CT 02/28/2022. FINDINGS: Brain: Stable cerebral volume, chronic temporal lobe atrophy. No midline shift, ventriculomegaly, mass effect, evidence of mass lesion, intracranial hemorrhage or evidence of cortically based acute infarction. Gray-white differentiation is stable  and within normal limits for age. Vascular: Calcified atherosclerosis at the skull base. No suspicious intracranial vascular hyperdensity. Skull: Hyperostosis of the calvarium. No acute osseous abnormality identified. Sinuses/Orbits: Visualized paranasal sinuses and mastoids are stable and well aerated. Other: Orbit and scalp soft tissues appears stable. IMPRESSION: 1. No acute intracranial abnormality or acute traumatic injury identified. 2. Chronic temporal lobe atrophy.  Cerebral Atrophy (ICD10-G31.9). Electronically Signed   By: Odessa Fleming M.D.   On: 05/15/2022 07:16    Procedures Procedures  {Document cardiac monitor, telemetry assessment procedure when appropriate:1}  Medications Ordered in ED Medications  fentaNYL (SUBLIMAZE) injection 50 mcg (50 mcg Intravenous Given 05/15/22 0719)  insulin aspart (novoLOG) injection 0-6 Units (has no administration in time range)  insulin aspart (novoLOG) injection 0-5 Units (has no administration in time range)  oxyCODONE (Oxy IR/ROXICODONE) immediate release tablet 5 mg (has no administration in time range)  methocarbamol (ROBAXIN) tablet 500 mg (has no administration in time range)    ED Course/ Medical Decision Making/ A&P   {   Click here for ABCD2, HEART and other calculatorsREFRESH Note before signing :1}                          Medical Decision Making Amount and/or Complexity of Data Reviewed Labs: ordered. Radiology: ordered.  Risk Prescription drug management. Decision regarding hospitalization.   Patient with a fractured hip on the right.  She will be admitted to medicine and will have surgery tomorrow by Dr. Romeo Apple orthopedic  {Document critical care time when appropriate:1} {Document review of labs and clinical decision tools ie heart score, Chads2Vasc2 etc:1}  {Document your independent review of radiology images, and any outside records:1} {Document your discussion with family members, caretakers, and with  consultants:1} {Document social determinants of health affecting pt's care:1} {Document your decision making why or why not admission, treatments were needed:1} Final Clinical Impression(s) / ED Diagnoses Final diagnoses:  None    Rx / DC Orders ED Discharge Orders     None

## 2022-05-15 NOTE — ED Notes (Signed)
Patient transported to CT 

## 2022-05-15 NOTE — H&P (View-Only) (Signed)
HOSPITAL CONSULT  [facility]   Patient ID: Brittany [name], female   DOB: May 19, 1927, 89 y.o.   MRN: [id number]  New patient  Requested by:  Reason for:   Based on the information below I recommend ORIF right hip with intramedullary nail*  Chief Complaint  Patient presents with   Fall     HPI  According to the chart history patient had a mechanical fall was found down complaining of right hip pain was brought in by EMS with deformity of the right lower extremity  She has dementia  I talked to her daughter [name] who is controlling her medical decisions she agreed to proceed with surgical intervention understanding the risks of surgery in this setting which include but are not limited to bleeding, infection, deep vein thrombosis, stroke, cardiac arrhythmia and death  Review of Systems (all) ROS could not assess due to dementia  Past Medical History:  Diagnosis Date   Atrial flutter (HCC)    CAD (coronary artery disease)    CHF (congestive heart failure) (HCC)    Complete heart block (HCC)    a. s/p STJ dual chamber PPM   Depression    Glaucoma    Hyperlipidemia    Hypertension    Pacemaker    Type II diabetes mellitus (HCC)     Past Surgical History:  Procedure Laterality Date   BREAST BIOPSY  1991   ELECTROPHYSIOLOGIC STUDY N/A 02/12/2015   cardioversion by Dr Graciela Husbands    EP IMPLANTABLE DEVICE N/A 04/07/2015   a. STJ dual chamber PPM implanted 2006 for complete heart block b. gen change 2017   FEMUR IM NAIL  01/18/2012   Procedure: INTRAMEDULLARY (IM) NAIL FEMORAL;  Surgeon: Senaida Lange, MD;  Location: [facility] OR;  Service: Orthopedics;  Laterality: Left;   INGUINAL HERNIA REPAIR Right 03/22/2022   Procedure: HERNIA REPAIR INGUINAL ADULT;  Surgeon: Lucretia Roers, MD;  Location: [facility];  Service: General;  Laterality: Right;   left total knee replacement Bilateral 1996   TONSILLECTOMY  1937    Family History  Problem Relation Age of Onset    Diabetes Mother    Heart attack Father    Stroke Father    Hypertension Father    Kidney disease Daughter    Social History   Tobacco Use   Smoking status: Former    Types: Cigarettes    Quit date: 01/17/1951    Years since quitting: 71.3   Smokeless tobacco: Never  Vaping Use   Vaping Use: Never used  Substance Use Topics   Alcohol use: Not Currently    Comment: Occasional glass of wine   Drug use: No   Allergies  Allergen Reactions   Ciprofloxacin Other (See Comments)    Severe chest pains   Ace Inhibitors Other (See Comments)    REACTION: cough and sore throat   Cedax [Ceftibuten] Other (See Comments)    Possibly nausea/vomiting, cyanosis, severe headaches   Lipitor [Atorvastatin] Other (See Comments)    REACTION: Myalgia   Plavix [Clopidogrel Bisulfate] Other (See Comments)    Possibly nausea/vomiting, cyanosis, severe headaches     Statins Other (See Comments)    Myalgias, cyanosis    Current Facility-Administered Medications:    0.9 %  sodium chloride infusion, , Intravenous, PRN, Emokpae, Courage, MD   acetaminophen (TYLENOL) tablet 650 mg, 650 mg, Oral, Q6H PRN **OR** acetaminophen (TYLENOL) suppository 650 mg, 650 mg, Rectal, Q6H PRN, Emokpae, Courage, MD   bisacodyl (DULCOLAX) suppository 10  mg, 10 mg, Rectal, Daily PRN, Mariea Clonts, Courage, MD   cyanocobalamin (VITAMIN B12) tablet 1,000 mcg, 1,000 mcg, Oral, Daily, Emokpae, Courage, MD, 1,000 mcg at 05/15/22 1438   famotidine (PEPCID) tablet 20 mg, 20 mg, Oral, Daily, Emokpae, Courage, MD, 20 mg at 05/15/22 1438   fentaNYL (SUBLIMAZE) injection 50 mcg, 50 mcg, Intravenous, Q30 min PRN, Mariea Clonts, Courage, MD, 50 mcg at 05/15/22 0719   furosemide (LASIX) tablet 40 mg, 40 mg, Oral, Daily, Emokpae, Courage, MD, 40 mg at 05/15/22 1438   insulin aspart (novoLOG) injection 0-5 Units, 0-5 Units, Subcutaneous, QHS, Emokpae, Courage, MD   insulin aspart (novoLOG) injection 0-6 Units, 0-6 Units, Subcutaneous, TID WC, Emokpae,  Courage, MD, 1 Units at 05/15/22 1333   irbesartan (AVAPRO) tablet 37.5 mg, 37.5 mg, Oral, Daily, Emokpae, Courage, MD, 37.5 mg at 05/15/22 1438   isosorbide mononitrate (IMDUR) 24 hr tablet 30 mg, 30 mg, Oral, Daily, Emokpae, Courage, MD, 30 mg at 05/15/22 1438   methocarbamol (ROBAXIN) tablet 500 mg, 500 mg, Oral, TID, Mariea Clonts, Courage, MD, 500 mg at 05/15/22 0901   multivitamin with minerals tablet 1 tablet, 1 tablet, Oral, Daily, Emokpae, Courage, MD, 1 tablet at 05/15/22 1438   oxyCODONE (Oxy IR/ROXICODONE) immediate release tablet 5 mg, 5 mg, Oral, Q4H PRN, Mariea Clonts, Courage, MD, 5 mg at 05/15/22 1438   polyethylene glycol (MIRALAX / GLYCOLAX) packet 17 g, 17 g, Oral, Daily PRN, Emokpae, Courage, MD   potassium chloride SA (KLOR-CON M) CR tablet 20 mEq, 20 mEq, Oral, Daily, Emokpae, Courage, MD, 20 mEq at 05/15/22 1438   senna-docusate (Senokot-S) tablet 2 tablet, 2 tablet, Oral, BID, Emokpae, Courage, MD   sodium chloride flush (NS) 0.9 % injection 3 mL, 3 mL, Intravenous, Q12H, Emokpae, Courage, MD   sodium chloride flush (NS) 0.9 % injection 3 mL, 3 mL, Intravenous, Q12H, Emokpae, Courage, MD   sodium chloride flush (NS) 0.9 % injection 3 mL, 3 mL, Intravenous, PRN, Emokpae, Courage, MD   traZODone (DESYREL) tablet 50 mg, 50 mg, Oral, QHS PRN, Mariea Clonts, Courage, MD    Physical Exam(=30) BP (!) 163/58 (BP Location: Left Arm)   Pulse 72   Temp 98.7 F (37.1 C) (Oral)   Resp 18   Ht 5\' 2"  (1.575 m)   Wt 64 kg   SpO2 100%   BMI 25.81 kg/m   Gen. Appearance small frame short stature  Peripheral vascular system normal  Lymph nodes in the lower extremities ARE NORMAL  Gait unable to ambulate on frx leg   Left Upper extremity  Inspection revealed no malalignment or asymmetry  Assessment of range of motion: Full range of motion was recorded  Assessment of stability: Elbow wrist and hand and shoulder were stable  Assessment of muscle strength and tone revealed grade 5 muscle  strength and normal muscle tone  Skin was normal without rash lesion or ulceration  Right upper extremity  Inspection revealed no malalignment or asymmetry  Assessment of range of motion: Full range of motion was recorded  Assessment of stability: Elbow wrist and hand and shoulder were stable  Assessment of muscle strength and tone revealed grade 5 muscle strength and normal muscle tone  Skin was normal without rash lesion or ulceration  Right Lower extremity  Inspection revealed  malalignment / ext rot. Shortening   Assessment of range of motion: Full range of motion was not possible secondary to pain   Assessment of stability: Ankle, knee  were stable  Assessment of muscle strength and tone  revealed normal muscle tone, motor exam deferred px could not co operate   Skin was normal without rash lesion or ulceration  Left lower extremity Inspection revealed no malalignment or asymmetry Assessment of range of motion: Full range of motion was recorded Assessment of stability: Ankle, knee and hip were stable Assessment of muscle strength and tone revealed grade 5 muscle strength and normal muscle tone Skin was normal without rash lesion or ulceration, prior hip incx from prev surgery   Coordination was tested by finger-to-nose nose and was normal Deep tendon reflexes were 2+ in the upper extremities , lower extrem defer  Examination of sensation by touch was normal for pain response   Mental status  Not Oriented to time or place but answers to her name   Mood and affect normal without depression anxiety or agitation  Dx:   Data Reviewed  ER RECORD REVIEWED: CONFIRMS HISTORY   I reviewed the following images and the reports and my independent interpretation is right hip  three-part intertrochanteric frx should be stable with reduction   Assessment  Right hip fracture three-part intertrochanteric frx  89 year old female on Eliquis; history of atrial flutter; recently had hernia  repair did well; previous history of left hip fracture several years ago did well  Still high risks associated with his procedure including bleeding stroke cardiac problems especially with arrhythmias  Plan   ORIF right hip with intramedullary nail   Vickki Hearing MD

## 2022-05-15 NOTE — Consult Note (Signed)
HOSPITAL CONSULT  [facility]   Patient ID: Brittany [name], female   DOB: [birth year], 89 y.o.   MRN: [id number]  New patient  Requested by:  Reason for:   Based on the information below I recommend ORIF right hip with intramedullary nail*  Chief Complaint  Patient presents with   Fall     HPI  According to the chart history patient had a mechanical fall was found down complaining of right hip pain was brought in by EMS with deformity of the right lower extremity  She has dementia  I talked to her daughter Brittany [name] who is controlling her medical decisions she agreed to proceed with surgical intervention understanding the risks of surgery in this setting which include but are not limited to bleeding, infection, deep vein thrombosis, stroke, cardiac arrhythmia and death  Review of Systems (all) ROS  Past Medical History:  Diagnosis Date   Atrial flutter (HCC)    CAD (coronary artery disease)    CHF (congestive heart failure) (HCC)    Complete heart block (HCC)    a. s/p STJ dual chamber PPM   Depression    Glaucoma    Hyperlipidemia    Hypertension    Pacemaker    Type II diabetes mellitus (HCC)     Past Surgical History:  Procedure Laterality Date   BREAST BIOPSY  1991   ELECTROPHYSIOLOGIC STUDY N/A 02/12/2015   cardioversion by Dr Graciela Husbands    EP IMPLANTABLE DEVICE N/A 04/07/2015   a. STJ dual chamber PPM implanted 2006 for complete heart block b. gen change 2017   FEMUR IM NAIL  01/18/2012   Procedure: INTRAMEDULLARY (IM) NAIL FEMORAL;  Surgeon: Senaida Lange, MD;  Location: [facility] OR;  Service: Orthopedics;  Laterality: Left;   INGUINAL HERNIA REPAIR Right 03/22/2022   Procedure: HERNIA REPAIR INGUINAL ADULT;  Surgeon: Lucretia Roers, MD;  Location: [facility];  Service: General;  Laterality: Right;   left total knee replacement Bilateral 1996   TONSILLECTOMY  1937    Family History  Problem Relation Age of Onset   Diabetes Mother    Heart attack Father     Stroke Father    Hypertension Father    Kidney disease Daughter    Social History   Tobacco Use   Smoking status: Former    Types: Cigarettes    Quit date: 01/17/1951    Years since quitting: 71.3   Smokeless tobacco: Never  Vaping Use   Vaping Use: Never used  Substance Use Topics   Alcohol use: Not Currently    Comment: Occasional glass of wine   Drug use: No   Allergies  Allergen Reactions   Ciprofloxacin Other (See Comments)    Severe chest pains   Ace Inhibitors Other (See Comments)    REACTION: cough and sore throat   Cedax [Ceftibuten] Other (See Comments)    Possibly nausea/vomiting, cyanosis, severe headaches   Lipitor [Atorvastatin] Other (See Comments)    REACTION: Myalgia   Plavix [Clopidogrel Bisulfate] Other (See Comments)    Possibly nausea/vomiting, cyanosis, severe headaches     Statins Other (See Comments)    Myalgias, cyanosis    Current Facility-Administered Medications:    0.9 %  sodium chloride infusion, , Intravenous, PRN, Emokpae, Courage, MD   acetaminophen (TYLENOL) tablet 650 mg, 650 mg, Oral, Q6H PRN **OR** acetaminophen (TYLENOL) suppository 650 mg, 650 mg, Rectal, Q6H PRN, Emokpae, Courage, MD   bisacodyl (DULCOLAX) suppository 10 mg, 10 mg, Rectal, Daily PRN,  Shon Hale, MD   cyanocobalamin (VITAMIN B12) tablet 1,000 mcg, 1,000 mcg, Oral, Daily, Emokpae, Courage, MD, 1,000 mcg at 05/15/22 1438   famotidine (PEPCID) tablet 20 mg, 20 mg, Oral, Daily, Emokpae, Courage, MD, 20 mg at 05/15/22 1438   fentaNYL (SUBLIMAZE) injection 50 mcg, 50 mcg, Intravenous, Q30 min PRN, Mariea Clonts, Courage, MD, 50 mcg at 05/15/22 0719   furosemide (LASIX) tablet 40 mg, 40 mg, Oral, Daily, Emokpae, Courage, MD, 40 mg at 05/15/22 1438   insulin aspart (novoLOG) injection 0-5 Units, 0-5 Units, Subcutaneous, QHS, Emokpae, Courage, MD   insulin aspart (novoLOG) injection 0-6 Units, 0-6 Units, Subcutaneous, TID WC, Emokpae, Courage, MD, 1 Units at 05/15/22 1333    irbesartan (AVAPRO) tablet 37.5 mg, 37.5 mg, Oral, Daily, Emokpae, Courage, MD, 37.5 mg at 05/15/22 1438   isosorbide mononitrate (IMDUR) 24 hr tablet 30 mg, 30 mg, Oral, Daily, Emokpae, Courage, MD, 30 mg at 05/15/22 1438   methocarbamol (ROBAXIN) tablet 500 mg, 500 mg, Oral, TID, Mariea Clonts, Courage, MD, 500 mg at 05/15/22 0901   multivitamin with minerals tablet 1 tablet, 1 tablet, Oral, Daily, Emokpae, Courage, MD, 1 tablet at 05/15/22 1438   oxyCODONE (Oxy IR/ROXICODONE) immediate release tablet 5 mg, 5 mg, Oral, Q4H PRN, Mariea Clonts, Courage, MD, 5 mg at 05/15/22 1438   polyethylene glycol (MIRALAX / GLYCOLAX) packet 17 g, 17 g, Oral, Daily PRN, Emokpae, Courage, MD   potassium chloride SA (KLOR-CON M) CR tablet 20 mEq, 20 mEq, Oral, Daily, Emokpae, Courage, MD, 20 mEq at 05/15/22 1438   senna-docusate (Senokot-S) tablet 2 tablet, 2 tablet, Oral, BID, Emokpae, Courage, MD   sodium chloride flush (NS) 0.9 % injection 3 mL, 3 mL, Intravenous, Q12H, Emokpae, Courage, MD   sodium chloride flush (NS) 0.9 % injection 3 mL, 3 mL, Intravenous, Q12H, Emokpae, Courage, MD   sodium chloride flush (NS) 0.9 % injection 3 mL, 3 mL, Intravenous, PRN, Emokpae, Courage, MD   traZODone (DESYREL) tablet 50 mg, 50 mg, Oral, QHS PRN, Mariea Clonts, Courage, MD    Physical Exam(=30) BP (!) 163/58 (BP Location: Left Arm)   Pulse 72   Temp 98.7 F (37.1 C) (Oral)   Resp 18   Ht 5\' 2"  (1.575 m)   Wt 64 kg   SpO2 100%   BMI 25.81 kg/m   Gen. Appearance *** Peripheral vascular system *** Lymph nodes ARE NORMAL  Gait ***  Left Upper extremity  Inspection revealed no malalignment or asymmetry  Assessment of range of motion: Full range of motion was recorded  Assessment of stability: Elbow wrist and hand and shoulder were stable  Assessment of muscle strength and tone revealed grade 5 muscle strength and normal muscle tone  Skin was normal without rash lesion or ulceration  Right upper extremity  Inspection  revealed no malalignment or asymmetry  Assessment of range of motion: Full range of motion was recorded  Assessment of stability: Elbow wrist and hand and shoulder were stable  Assessment of muscle strength and tone revealed grade 5 muscle strength and normal muscle tone  Skin was normal without rash lesion or ulceration  Right Lower extremity  Inspection revealed no malalignment or asymmetry  Assessment of range of motion: Full range of motion was recorded  Assessment of stability: Ankle, knee and hip were stable  Assessment of muscle strength and tone revealed grade 5 muscle strength and normal muscle tone  Skin was normal without rash lesion or ulceration  Left lower extremity Inspection revealed no malalignment or asymmetry  Assessment of range of motion: Full range of motion was recorded Assessment of stability: Ankle, knee and hip were stable Assessment of muscle strength and tone revealed grade 5 muscle strength and normal muscle tone Skin was normal without rash lesion or ulceration  Coordination was tested by finger-to-nose nose and was normal ***Deep tendon reflexes were 2+ in the upper extremities and 2+ in the lower extremities Examination of sensation by touch was normal  Mental status  Oriented to time person and place normal  Mood and affect normal without depression anxiety or agitation  Dx:   Data Reviewed  ER RECORD REVIEWED: CONFIRMS HISTORY   I reviewed the following images and the reports and my independent interpretation is right hip for actual three-part intertrochanteric should be stable with reduction   Assessment  Right hip fracture three-part inotrope 89 year old female on Eliquis history of atrial flutter recently had hernia repair did well previous history of left hip fracture several years ago did well  Still high risks associated with his procedure including bleeding stroke cardiac problems especially with arrhythmias  Plan   ORIF right hip  with intramedullary nail   Vickki Hearing MD

## 2022-05-15 NOTE — ED Provider Triage Note (Signed)
Emergency Medicine Provider Triage Evaluation Note  Brittany [name] , a 89 y.o. female  was evaluated in triage.  Pt complains of unwitness fall, hip pain.  Review of Systems  Positive: Hip pain    Physical Exam  BP (!) 167/76 (BP Location: Left Arm)   Pulse 73   Temp 97.9 F (36.6 C) (Oral)   Resp (!) 22   Ht 1.575 m (5\' 2" )   Wt 64 kg   SpO2 97%   BMI 25.81 kg/m  Gen:   Awake, anxious Resp:  Normal effort  MSK:   Right LE is shortened and rotated  Medical Decision Making  Medically screening exam initiated at 6:36 AM.  Appropriate orders placed.  [name] was informed that the remainder of the evaluation will be completed by another provider, this initial triage assessment does not replace that evaluation, and the importance of remaining in the ED until their evaluation is complete.     Zadie Rhine, MD 05/15/22 [phone]

## 2022-05-16 ENCOUNTER — Inpatient Hospital Stay (HOSPITAL_COMMUNITY): Payer: Medicare Other | Admitting: Anesthesiology

## 2022-05-16 ENCOUNTER — Encounter (HOSPITAL_COMMUNITY): Payer: Self-pay | Admitting: Family Medicine

## 2022-05-16 ENCOUNTER — Other Ambulatory Visit: Payer: Self-pay

## 2022-05-16 ENCOUNTER — Inpatient Hospital Stay (HOSPITAL_COMMUNITY): Payer: Medicare Other

## 2022-05-16 ENCOUNTER — Encounter (HOSPITAL_COMMUNITY)
Admission: EM | Disposition: A | Discharge status: Skilled Nursing Facility | Payer: Self-pay | Source: Home / Self Care | Attending: Family Medicine

## 2022-05-16 DIAGNOSIS — S72141A Displaced intertrochanteric fracture of right femur, initial encounter for closed fracture: Secondary | ICD-10-CM

## 2022-05-16 DIAGNOSIS — Z87891 Personal history of nicotine dependence: Secondary | ICD-10-CM

## 2022-05-16 DIAGNOSIS — Z950 Presence of cardiac pacemaker: Secondary | ICD-10-CM | POA: Diagnosis not present

## 2022-05-16 DIAGNOSIS — I110 Hypertensive heart disease with heart failure: Secondary | ICD-10-CM

## 2022-05-16 DIAGNOSIS — I5032 Chronic diastolic (congestive) heart failure: Secondary | ICD-10-CM | POA: Diagnosis not present

## 2022-05-16 DIAGNOSIS — I10 Essential (primary) hypertension: Secondary | ICD-10-CM

## 2022-05-16 DIAGNOSIS — I509 Heart failure, unspecified: Secondary | ICD-10-CM

## 2022-05-16 DIAGNOSIS — S72001A Fracture of unspecified part of neck of right femur, initial encounter for closed fracture: Secondary | ICD-10-CM | POA: Diagnosis not present

## 2022-05-16 DIAGNOSIS — I2510 Atherosclerotic heart disease of native coronary artery without angina pectoris: Secondary | ICD-10-CM

## 2022-05-16 HISTORY — PX: INTRAMEDULLARY (IM) NAIL INTERTROCHANTERIC: SHX5875

## 2022-05-16 LAB — BASIC METABOLIC PANEL
Anion gap: 7 (ref 5–15)
BUN: 13 mg/dL (ref 8–23)
CO2: 26 mmol/L (ref 22–32)
Calcium: 10.1 mg/dL (ref 8.9–10.3)
Chloride: 106 mmol/L (ref 98–111)
Creatinine, Ser: 0.76 mg/dL (ref 0.44–1.00)
GFR, Estimated: >60 mL/min (ref >60)
Glucose, Bld: 143 mg/dL — ABNORMAL HIGH (ref 70–99)
Potassium: 4.1 mmol/L (ref 3.5–5.1)
Sodium: 139 mmol/L (ref 135–145)

## 2022-05-16 LAB — CBC
HCT: 35.3 % — ABNORMAL LOW (ref 36.0–46.0)
Hemoglobin: 11.6 g/dL — ABNORMAL LOW (ref 12.0–15.0)
MCH: 29.7 pg (ref 26.0–34.0)
MCHC: 32.9 g/dL (ref 30.0–36.0)
MCV: 90.5 fL (ref 80.0–100.0)
Platelets: 215 10*3/uL (ref 150–400)
RBC: 3.9 MIL/uL (ref 3.87–5.11)
RDW: 15.1 % (ref 11.5–15.5)
WBC: 12 10*3/uL — ABNORMAL HIGH (ref 4.0–10.5)
nRBC: 0 % (ref 0.0–0.2)

## 2022-05-16 LAB — GLUCOSE, CAPILLARY
Glucose-Capillary: 136 mg/dL — ABNORMAL HIGH (ref 70–99)
Glucose-Capillary: 162 mg/dL — ABNORMAL HIGH (ref 70–99)
Glucose-Capillary: 130 mg/dL — ABNORMAL HIGH (ref 70–99)
Glucose-Capillary: 128 mg/dL — ABNORMAL HIGH (ref 70–99)

## 2022-05-16 SURGERY — FIXATION, FRACTURE, INTERTROCHANTERIC, WITH INTRAMEDULLARY ROD
Anesthesia: General | Site: Hip | Laterality: Right

## 2022-05-16 MED ORDER — FENTANYL CITRATE (PF) 100 MCG/2ML IJ SOLN
INTRAMUSCULAR | Status: DC | PRN
  Administered 2022-05-16: 50 ug via INTRAVENOUS
  Administered 2022-05-16 (×2): 25 ug via INTRAVENOUS

## 2022-05-16 MED ORDER — PHENYLEPHRINE HCL (PRESSORS) 10 MG/ML IV SOLN
INTRAVENOUS | Status: DC | PRN
  Administered 2022-05-16 (×3): 100 ug via INTRAVENOUS

## 2022-05-16 MED ORDER — TRANEXAMIC ACID-NACL 1000-0.7 MG/100ML-% IV SOLN
INTRAVENOUS | Status: AC
  Filled 2022-05-16: qty 100

## 2022-05-16 MED ORDER — POVIDONE-IODINE 10 % EX SWAB
2.0000 | Freq: Once | CUTANEOUS | Status: DC
  Administered 2022-05-16: 2 via TOPICAL

## 2022-05-16 MED ORDER — GENTAMICIN SULFATE 40 MG/ML IJ SOLN
5.0000 mg/kg | INTRAVENOUS | Status: AC
  Administered 2022-05-16: 320 mg via INTRAVENOUS
  Filled 2022-05-16: qty 8

## 2022-05-16 MED ORDER — TRANEXAMIC ACID-NACL 1000-0.7 MG/100ML-% IV SOLN
1000.0000 mg | Freq: Once | INTRAVENOUS | Status: AC
  Administered 2022-05-16: 1000 mg via INTRAVENOUS
  Filled 2022-05-16: qty 100

## 2022-05-16 MED ORDER — PHENOL 1.4 % MT LIQD: 1.0000 | OROMUCOSAL | Status: DC | PRN

## 2022-05-16 MED ORDER — VANCOMYCIN HCL IN DEXTROSE 1-5 GM/200ML-% IV SOLN
INTRAVENOUS | Status: AC
  Filled 2022-05-16: qty 200

## 2022-05-16 MED ORDER — LACTATED RINGERS IV SOLN: INTRAVENOUS | Status: DC

## 2022-05-16 MED ORDER — TRANEXAMIC ACID-NACL 1000-0.7 MG/100ML-% IV SOLN
1000.0000 mg | INTRAVENOUS | Status: AC
  Administered 2022-05-16: 1000 mg via INTRAVENOUS

## 2022-05-16 MED ORDER — VANCOMYCIN HCL IN DEXTROSE 1-5 GM/200ML-% IV SOLN
1000.0000 mg | Freq: Two times a day (BID) | INTRAVENOUS | Status: AC
  Administered 2022-05-16: 1000 mg via INTRAVENOUS
  Filled 2022-05-16: qty 200

## 2022-05-16 MED ORDER — OXYCODONE HCL 5 MG PO TABS: 5.0000 mg | ORAL_TABLET | Freq: Once | ORAL | Status: DC | PRN

## 2022-05-16 MED ORDER — BUPIVACAINE HCL (PF) 0.5 % IJ SOLN
INTRAMUSCULAR | Status: DC | PRN
  Administered 2022-05-16: 30 mL

## 2022-05-16 MED ORDER — FENTANYL CITRATE PF 50 MCG/ML IJ SOSY
25.0000 ug | PREFILLED_SYRINGE | INTRAMUSCULAR | Status: DC | PRN
  Administered 2022-05-16: 50 ug via INTRAVENOUS
  Filled 2022-05-16: qty 1

## 2022-05-16 MED ORDER — CHLORHEXIDINE GLUCONATE 4 % EX SOLN: 60.0000 mL | Freq: Once | CUTANEOUS | Status: DC

## 2022-05-16 MED ORDER — VANCOMYCIN HCL IN DEXTROSE 1-5 GM/200ML-% IV SOLN
1000.0000 mg | INTRAVENOUS | Status: AC
  Administered 2022-05-16: 1000 mg via INTRAVENOUS

## 2022-05-16 MED ORDER — MENTHOL 3 MG MT LOZG: 1.0000 | LOZENGE | OROMUCOSAL | Status: DC | PRN

## 2022-05-16 MED ORDER — PROPOFOL 10 MG/ML IV BOLUS
INTRAVENOUS | Status: AC
  Filled 2022-05-16: qty 20

## 2022-05-16 MED ORDER — ONDANSETRON HCL 4 MG PO TABS: 4.0000 mg | ORAL_TABLET | Freq: Four times a day (QID) | ORAL | Status: DC | PRN

## 2022-05-16 MED ORDER — FENTANYL CITRATE (PF) 100 MCG/2ML IJ SOLN
INTRAMUSCULAR | Status: AC
  Filled 2022-05-16: qty 2

## 2022-05-16 MED ORDER — FUROSEMIDE 40 MG PO TABS
40.0000 mg | ORAL_TABLET | Freq: Every day | ORAL | Status: DC
  Administered 2022-05-17 – 2022-05-18 (×2): 40 mg via ORAL
  Filled 2022-05-16 (×2): qty 1

## 2022-05-16 MED ORDER — METOCLOPRAMIDE HCL 5 MG PO TABS: 5.0000 mg | ORAL_TABLET | Freq: Three times a day (TID) | ORAL | Status: DC | PRN

## 2022-05-16 MED ORDER — SODIUM CHLORIDE 0.9 % IV SOLN: INTRAVENOUS | Status: DC

## 2022-05-16 MED ORDER — SODIUM CHLORIDE 0.9 % IV SOLN: INTRAVENOUS | Status: AC

## 2022-05-16 MED ORDER — HYDROCODONE-ACETAMINOPHEN 5-325 MG PO TABS
1.0000 | ORAL_TABLET | ORAL | Status: DC | PRN
  Administered 2022-05-17 (×2): 1 via ORAL
  Administered 2022-05-18 (×2): 2 via ORAL
  Filled 2022-05-16: qty 1
  Filled 2022-05-16 (×2): qty 2
  Filled 2022-05-16: qty 1

## 2022-05-16 MED ORDER — ACETAMINOPHEN 325 MG PO TABS
325.0000 mg | ORAL_TABLET | Freq: Four times a day (QID) | ORAL | Status: DC | PRN
  Administered 2022-05-17: 650 mg via ORAL
  Filled 2022-05-16: qty 2

## 2022-05-16 MED ORDER — PROPOFOL 10 MG/ML IV BOLUS
INTRAVENOUS | Status: DC | PRN
  Administered 2022-05-16: 100 mg via INTRAVENOUS

## 2022-05-16 MED ORDER — DOCUSATE SODIUM 100 MG PO CAPS
100.0000 mg | ORAL_CAPSULE | Freq: Two times a day (BID) | ORAL | Status: DC
  Administered 2022-05-16 – 2022-05-18 (×4): 100 mg via ORAL
  Filled 2022-05-16 (×4): qty 1

## 2022-05-16 MED ORDER — OXYCODONE HCL 5 MG/5ML PO SOLN: 5.0000 mg | Freq: Once | ORAL | Status: DC | PRN

## 2022-05-16 MED ORDER — HYDROCODONE-ACETAMINOPHEN 7.5-325 MG PO TABS
1.0000 | ORAL_TABLET | ORAL | Status: DC | PRN
  Administered 2022-05-16 – 2022-05-18 (×3): 2 via ORAL
  Filled 2022-05-16 (×3): qty 2

## 2022-05-16 MED ORDER — ACETAMINOPHEN 500 MG PO TABS
500.0000 mg | ORAL_TABLET | Freq: Four times a day (QID) | ORAL | Status: AC
  Administered 2022-05-16 – 2022-05-17 (×3): 500 mg via ORAL
  Filled 2022-05-16 (×3): qty 1

## 2022-05-16 MED ORDER — METOCLOPRAMIDE HCL 5 MG/ML IJ SOLN: 5.0000 mg | Freq: Three times a day (TID) | INTRAMUSCULAR | Status: DC | PRN

## 2022-05-16 MED ORDER — ORAL CARE MOUTH RINSE: 15.0000 mL | Freq: Once | OROMUCOSAL | Status: DC

## 2022-05-16 MED ORDER — LIDOCAINE HCL (CARDIAC) PF 50 MG/5ML IV SOSY
PREFILLED_SYRINGE | INTRAVENOUS | Status: DC | PRN
  Administered 2022-05-16: 80 mg via INTRAVENOUS

## 2022-05-16 MED ORDER — APIXABAN 5 MG PO TABS
5.0000 mg | ORAL_TABLET | Freq: Two times a day (BID) | ORAL | Status: DC
  Administered 2022-05-18: 5 mg via ORAL
  Filled 2022-05-16: qty 1

## 2022-05-16 MED ORDER — CHLORHEXIDINE GLUCONATE 0.12 % MT SOLN: 15.0000 mL | Freq: Once | OROMUCOSAL | Status: DC

## 2022-05-16 MED ORDER — SODIUM CHLORIDE 0.9 % IR SOLN
Status: DC | PRN
  Administered 2022-05-16: 1000 mL

## 2022-05-16 MED ORDER — BUPIVACAINE HCL (PF) 0.5 % IJ SOLN
INTRAMUSCULAR | Status: AC
  Filled 2022-05-16: qty 30

## 2022-05-16 MED ORDER — MORPHINE SULFATE (PF) 2 MG/ML IV SOLN: 0.5000 mg | INTRAVENOUS | Status: DC | PRN

## 2022-05-16 MED ORDER — ONDANSETRON HCL 4 MG/2ML IJ SOLN: 4.0000 mg | Freq: Four times a day (QID) | INTRAMUSCULAR | Status: DC | PRN

## 2022-05-16 SURGICAL SUPPLY — 62 items
BENZOIN TINCTURE PRP APPL 2/3 (GAUZE/BANDAGES/DRESSINGS) IMPLANT
BIT DRILL 4.0X280 (BIT) IMPLANT
BLADE SURG SZ10 CARB STEEL (BLADE) ×2 IMPLANT
BNDG GAUZE ELAST 4 BULKY (GAUZE/BANDAGES/DRESSINGS) ×1 IMPLANT
CHLORAPREP W/TINT 26 (MISCELLANEOUS) ×1 IMPLANT
CLOTH BEACON ORANGE TIMEOUT ST (SAFETY) ×1 IMPLANT
CLSR STERI-STRIP ANTIMIC 1/2X4 (GAUZE/BANDAGES/DRESSINGS) IMPLANT
COVER LIGHT HANDLE STERIS (MISCELLANEOUS) ×2 IMPLANT
COVER MAYO STAND XLG (MISCELLANEOUS) ×1 IMPLANT
COVER PERINEAL POST (MISCELLANEOUS) ×1 IMPLANT
DECANTER SPIKE VIAL GLASS SM (MISCELLANEOUS) ×2 IMPLANT
DRAPE INCISE IOBAN 44X35 STRL (DRAPES) IMPLANT
DRAPE STERI IOBAN 125X83 (DRAPES) ×1 IMPLANT
DRSG MEPILEX BORDER 4X12 (GAUZE/BANDAGES/DRESSINGS) ×1 IMPLANT
DRSG MEPILEX POST OP 4X12 (GAUZE/BANDAGES/DRESSINGS) IMPLANT
DRSG MEPILEX SACRM 8.7X9.8 (GAUZE/BANDAGES/DRESSINGS) ×1 IMPLANT
DRSG PAD ABDOMINAL 8X10 ST (GAUZE/BANDAGES/DRESSINGS) ×1 IMPLANT
ELECT REM PT RETURN 9FT ADLT (ELECTROSURGICAL) ×1
ELECTRODE REM PT RTRN 9FT ADLT (ELECTROSURGICAL) ×1 IMPLANT
GLOVE BIOGEL PI IND STRL 6.5 (GLOVE) IMPLANT
GLOVE BIOGEL PI IND STRL 7.0 (GLOVE) ×2 IMPLANT
GLOVE SS N UNI LF 8.5 STRL (GLOVE) ×1 IMPLANT
GLOVE SURG POLYISO LF SZ8 (GLOVE) ×1 IMPLANT
GLOVE SURG SS PI 6.5 STRL IVOR (GLOVE) IMPLANT
GOWN STRL REUS W/TWL LRG LVL3 (GOWN DISPOSABLE) ×1 IMPLANT
GOWN STRL REUS W/TWL XL LVL3 (GOWN DISPOSABLE) ×1 IMPLANT
GUIDEROD T2 3X1000 (ROD) ×1 IMPLANT
GUIDEWIRE BALL NOSE 3.0X900 (WIRE) ×1
GUIDEWIRE ORTH 900X3XBALL NOSE (WIRE) IMPLANT
INST SET MAJOR BONE (KITS) ×1 IMPLANT
K-WIRE  3.2X450M STR (WIRE) ×1
K-WIRE 3.2X450M STR (WIRE) ×1
KIT BLADEGUARD II DBL (SET/KITS/TRAYS/PACK) ×1 IMPLANT
KIT TURNOVER CYSTO (KITS) ×1 IMPLANT
KWIRE 3.2X450M STR (WIRE) ×1 IMPLANT
MANIFOLD NEPTUNE II (INSTRUMENTS) ×1 IMPLANT
MARKER SKIN DUAL TIP RULER LAB (MISCELLANEOUS) ×1 IMPLANT
NAIL RIGHT 11X125 (Nail) IMPLANT
NDL HYPO 21X1.5 SAFETY (NEEDLE) ×1 IMPLANT
NDL SPNL 18GX3.5 QUINCKE PK (NEEDLE) ×1 IMPLANT
NEEDLE HYPO 21X1.5 SAFETY (NEEDLE) ×1 IMPLANT
NEEDLE SPNL 18GX3.5 QUINCKE PK (NEEDLE) ×1 IMPLANT
NS IRRIG 1000ML POUR BTL (IV SOLUTION) ×1 IMPLANT
PACK BASIC III (CUSTOM PROCEDURE TRAY) ×1
PACK SRG BSC III STRL LF ECLPS (CUSTOM PROCEDURE TRAY) ×1 IMPLANT
PAD ARMBOARD 7.5X6 YLW CONV (MISCELLANEOUS) ×1 IMPLANT
PENCIL SMOKE EVACUATOR COATED (MISCELLANEOUS) ×1 IMPLANT
PIN GUIDE THRD AR 3.2X330 (PIN) IMPLANT
SCREW LOCK CORT 5X36 (Screw) IMPLANT
SCREW LOCK LAG FEM 10.5X90 (Screw) IMPLANT
SET BASIN LINEN APH (SET/KITS/TRAYS/PACK) ×1 IMPLANT
SPONGE T-LAP 18X18 ~~LOC~~+RFID (SPONGE) ×2 IMPLANT
STAPLER VISISTAT 35W (STAPLE) IMPLANT
STRIP CLOSURE SKIN 1/2X4 (GAUZE/BANDAGES/DRESSINGS) IMPLANT
SUT BRALON NAB BRD #1 30IN (SUTURE) ×1 IMPLANT
SUT MNCRL 0 VIOLET CTX 36 (SUTURE) ×1 IMPLANT
SUT MON AB 2-0 CT1 36 (SUTURE) ×1 IMPLANT
SYR 30ML LL (SYRINGE) ×1 IMPLANT
SYR BULB IRRIG 60ML STRL (SYRINGE) ×2 IMPLANT
TOOL ACTIVATION (INSTRUMENTS) IMPLANT
TRAY FOLEY MTR SLVR 16FR STAT (SET/KITS/TRAYS/PACK) ×1 IMPLANT
YANKAUER SUCT BULB TIP NO VENT (SUCTIONS) ×1 IMPLANT

## 2022-05-16 NOTE — Interval H&P Note (Signed)
History and Physical Interval Note:  05/16/2022 9:00 AM  Brittany [name]  has presented today for surgery, with the diagnosis of right hip fracture intertrochanteric.  The various methods of treatment have been discussed with the patient and family. After consideration of risks, benefits and other options for treatment, the patient has consented to  Procedure(s) with comments: INTRAMEDULLARY (IM) NAIL INTERTROCHANTERIC (Right) - on eliquis needs general as a surgical intervention.  The patient's history has been reviewed, patient examined, no change in status, stable for surgery.  I have reviewed the patient's chart and labs.  Questions were answered to the patient's satisfaction.     Fuller Canada

## 2022-05-16 NOTE — Anesthesia Procedure Notes (Signed)
Procedure Name: LMA Insertion Date/Time: 05/16/2022 9:43 AM  Performed by: Moshe Salisbury, CRNAPre-anesthesia Checklist: Patient identified, Patient being monitored, Emergency Drugs available, Timeout performed and Suction available Patient Re-evaluated:Patient Re-evaluated prior to induction Oxygen Delivery Method: Circle System Utilized Preoxygenation: Pre-oxygenation with 100% oxygen Induction Type: IV induction Ventilation: Mask ventilation without difficulty LMA: LMA inserted LMA Size: 3.0 Number of attempts: 1 Placement Confirmation: positive ETCO2 and breath sounds checked- equal and bilateral

## 2022-05-16 NOTE — NC FL2 (Signed)
[place] MEDICAID FL2 LEVEL OF CARE FORM     IDENTIFICATION  Patient Name: Brittany [name] Birthdate: Jan 16, 1927 Sex: female Admission Date (Current Location): 05/15/2022  [facility] and [place] Number:  [employer] and Address:  [facility],  [address] [place] [address]      Provider Number: [id number]  Attending Physician Name and Address:  Shon Hale, MD  Relative Name and Phone Number:       Current Level of Care: Hospital Recommended Level of Care: Skilled Nursing Facility Prior Approval Number:    Date Approved/Denied:   PASRR Number: [id number]  Discharge Plan: SNF    Current Diagnoses: Patient Active Problem List   Diagnosis Date Noted   Hip fracture (HCC) 05/15/2022   Pancreatic lesion 04/24/2022   Regurgitation of food 04/24/2022   Preop cardiovascular exam 03/21/2022   Incarcerated right inguinal hernia 03/19/2022   Lactic acidosis 03/19/2022   LBBB (left bundle branch block) 03/19/2022   Type 2 diabetes mellitus with hyperglycemia (HCC) 03/19/2022   Constipation 03/19/2022   Obesity (BMI 30-39.9) 03/19/2022   Prolonged QT interval 03/19/2022   Acute metabolic encephalopathy 02/28/2022   Hypokalemia 02/28/2022   Leg swelling 06/22/2020   Atrial flutter (HCC) 05/06/2020   CHF (congestive heart failure) (HCC) 01/02/2015   Chronic diastolic CHF (congestive heart failure) (HCC) 01/02/2015   DM2 (diabetes mellitus, type 2) (HCC) 01/02/2015   Belching 10/22/2012   SOB (shortness of breath) 10/22/2012   Lower extremity numbness 06/20/2012   Closed left hip fracture (HCC) 01/18/2012   HYPERSOMNIA UNSPECIFIED 06/23/2008   EDEMA 06/23/2008   GLAUCOMA 05/27/2008   LAD-balloon angioplasty/cutting balloon-2007 05/27/2008   AV BLOCK, COMPLETE 05/27/2008   Pacemaker 05/27/2008   HYPERLIPIDEMIA 11/05/2006   Essential hypertension 11/05/2006    Orientation RESPIRATION BLADDER Height & Weight     Self, Place  Normal  Continent Weight: 141 lb 1.5 oz (64 kg) Height:  5\' 2"  (157.5 cm)  BEHAVIORAL SYMPTOMS/MOOD NEUROLOGICAL BOWEL NUTRITION STATUS      Continent Diet (see dc summary)  AMBULATORY STATUS COMMUNICATION OF NEEDS Skin   Extensive Assist Verbally Surgical wounds                       Personal Care Assistance Level of Assistance  Bathing, Feeding, Dressing Bathing Assistance: Limited assistance Feeding assistance: Independent Dressing Assistance: Limited assistance     Functional Limitations Info  Sight, Hearing, Speech Sight Info: Adequate Hearing Info: Adequate Speech Info: Adequate    SPECIAL CARE FACTORS FREQUENCY  PT (By licensed PT), OT (By licensed OT)     PT Frequency: 5x week OT Frequency: 3x week            Contractures Contractures Info: Not present    Additional Factors Info  Code Status, Allergies Code Status Info: Full Allergies Info: Ciprofloxacin, Ace Inhibitors, Cedax (Ceftibuten), Lipitor (Atorvastatin), Plavix (Clopidogrel Bisulfate), Statins           Current Medications (05/16/2022):  This is the current hospital active medication list Current Facility-Administered Medications  Medication Dose Route Frequency Provider Last Rate Last Admin   0.9 %  sodium chloride infusion   Intravenous Continuous Vickki Hearing, MD 100 mL/hr at 05/16/22 1505 Continued from Pre-op at 05/16/22 1505   [START ON 05/17/2022] acetaminophen (TYLENOL) tablet 325-650 mg  325-650 mg Oral Q6H PRN Vickki Hearing, MD       acetaminophen (TYLENOL) tablet 500 mg  500 mg Oral Q6H Romeo Apple,  Fernande Boyden, MD   500 mg at 05/16/22 1247   [START ON 05/18/2022] apixaban (ELIQUIS) tablet 5 mg  5 mg Oral BID Vickki Hearing, MD       bisacodyl (DULCOLAX) suppository 10 mg  10 mg Rectal Daily PRN Vickki Hearing, MD       cyanocobalamin (VITAMIN B12) tablet 1,000 mcg  1,000 mcg Oral Daily Vickki Hearing, MD   1,000 mcg at 05/15/22 1438   docusate sodium (COLACE) capsule 100  mg  100 mg Oral BID Vickki Hearing, MD       famotidine (PEPCID) tablet 20 mg  20 mg Oral Daily Vickki Hearing, MD   20 mg at 05/15/22 1438   [START ON 05/17/2022] furosemide (LASIX) tablet 40 mg  40 mg Oral Daily Vickki Hearing, MD       hydrALAZINE (APRESOLINE) injection 10 mg  10 mg Intravenous Q6H PRN Vickki Hearing, MD       HYDROcodone-acetaminophen (NORCO) 7.5-325 MG per tablet 1-2 tablet  1-2 tablet Oral Q4H PRN Vickki Hearing, MD       HYDROcodone-acetaminophen (NORCO/VICODIN) 5-325 MG per tablet 1-2 tablet  1-2 tablet Oral Q4H PRN Vickki Hearing, MD       insulin aspart (novoLOG) injection 0-5 Units  0-5 Units Subcutaneous QHS Vickki Hearing, MD       insulin aspart (novoLOG) injection 0-6 Units  0-6 Units Subcutaneous TID WC Vickki Hearing, MD   1 Units at 05/15/22 1333   irbesartan (AVAPRO) tablet 37.5 mg  37.5 mg Oral Daily Vickki Hearing, MD   37.5 mg at 05/15/22 1438   isosorbide mononitrate (IMDUR) 24 hr tablet 30 mg  30 mg Oral Daily Vickki Hearing, MD   30 mg at 05/15/22 1438   menthol-cetylpyridinium (CEPACOL) lozenge 3 mg  1 lozenge Oral PRN Vickki Hearing, MD       Or   phenol (CHLORASEPTIC) mouth spray 1 spray  1 spray Mouth/Throat PRN Vickki Hearing, MD       methocarbamol (ROBAXIN) tablet 500 mg  500 mg Oral TID Vickki Hearing, MD   500 mg at 05/15/22 1819   metoCLOPramide (REGLAN) tablet 5-10 mg  5-10 mg Oral Q8H PRN Vickki Hearing, MD       Or   metoCLOPramide (REGLAN) injection 5-10 mg  5-10 mg Intravenous Q8H PRN Vickki Hearing, MD       morphine (PF) 2 MG/ML injection 0.5-1 mg  0.5-1 mg Intravenous Q2H PRN Vickki Hearing, MD       multivitamin with minerals tablet 1 tablet  1 tablet Oral Daily Vickki Hearing, MD   1 tablet at 05/15/22 1438   ondansetron (ZOFRAN) tablet 4 mg  4 mg Oral Q6H PRN Vickki Hearing, MD       Or   ondansetron [facility]) injection 4 mg  4 mg Intravenous Q6H  PRN Vickki Hearing, MD       oxyCODONE (Oxy IR/ROXICODONE) immediate release tablet 5 mg  5 mg Oral Q4H PRN Vickki Hearing, MD   5 mg at 05/15/22 1438   polyethylene glycol (MIRALAX / GLYCOLAX) packet 17 g  17 g Oral Daily PRN Vickki Hearing, MD       potassium chloride SA (KLOR-CON M) CR tablet 20 mEq  20 mEq Oral Daily Vickki Hearing, MD   20 mEq at 05/15/22 1438   senna-docusate (Senokot-S) tablet 2 tablet  2 tablet Oral BID Vickki Hearing, MD   2 tablet at 05/15/22 2137   sodium chloride flush (NS) 0.9 % injection 3 mL  3 mL Intravenous Q12H Vickki Hearing, MD       sodium chloride flush (NS) 0.9 % injection 3 mL  3 mL Intravenous Q12H Vickki Hearing, MD   3 mL at 05/15/22 2140   sodium chloride flush (NS) 0.9 % injection 3 mL  3 mL Intravenous PRN Vickki Hearing, MD   3 mL at 05/15/22 2140   tranexamic acid (CYKLOKAPRON) IVPB 1,000 mg  1,000 mg Intravenous Once Vickki Hearing, MD       traZODone (DESYREL) tablet 75 mg  75 mg Oral QHS Vickki Hearing, MD       vancomycin (VANCOCIN) IVPB 1000 mg/200 mL premix  1,000 mg Intravenous Q12H Vickki Hearing, MD         Discharge Medications: Please see discharge summary for a list of discharge medications.  Relevant Imaging Results:  Relevant Lab Results:   Additional Information SSN: [id number]  Elliot Gault, LCSW

## 2022-05-16 NOTE — Interval H&P Note (Signed)
History and Physical Interval Note:  05/16/2022 6:58 AM  Brittany [name]  has presented today for surgery, with the diagnosis of right hip fracture intertrochanteric.  The various methods of treatment have been discussed with the patient and family. After consideration of risks, benefits and other options for treatment, the patient has consented to  Procedure(s) with comments: INTRAMEDULLARY (IM) NAIL INTERTROCHANTERIC (Right) - on eliquis needs general as a surgical intervention.  The patient's history has been reviewed, patient examined, no change in status, stable for surgery.  I have reviewed the patient's chart and labs.  Questions were answered to the patient's satisfaction.     Fuller Canada

## 2022-05-16 NOTE — Transfer of Care (Signed)
Immediate Anesthesia Transfer of Care Note  Patient: DAVIN GRIFFIN  Procedure(s) Performed: INTRAMEDULLARY (IM) NAIL INTERTROCHANTERIC (Right: Hip)  Patient Location: PACU  Anesthesia Type:General  Level of Consciousness: awake  Airway & Oxygen Therapy: Patient Spontanous Breathing  Post-op Assessment: Report given to RN  Post vital signs: Reviewed and stable  Last Vitals:  Vitals Value Taken Time  BP 122/88 05/16/22 1146  Temp    Pulse 70 05/16/22 1150  Resp 13 05/16/22 1150  SpO2 97 % 05/16/22 1150  Vitals shown include unvalidated device data.  Last Pain:  Vitals:   05/16/22 0812  TempSrc: Oral  PainSc: 7          Complications: No notable events documented.

## 2022-05-16 NOTE — Anesthesia Preprocedure Evaluation (Signed)
Anesthesia Evaluation  Patient identified by MRN, date of birth, ID band Patient awake    Reviewed: Allergy & Precautions, H&P , NPO status , Patient's Chart, lab work & pertinent test results, reviewed documented beta blocker date and time   Airway Mallampati: II  TM Distance: >3 FB Neck ROM: full    Dental no notable dental hx.    Pulmonary neg pulmonary ROS, former smoker   Pulmonary exam normal breath sounds clear to auscultation       Cardiovascular Exercise Tolerance: Good hypertension, + CAD and +CHF  negative cardio ROS + dysrhythmias + pacemaker  Rhythm:regular Rate:Normal     Neuro/Psych  PSYCHIATRIC DISORDERS  Depression    negative neurological ROS  negative psych ROS   GI/Hepatic negative GI ROS, Neg liver ROS,,,  Endo/Other  negative endocrine ROSdiabetes    Renal/GU negative Renal ROS  negative genitourinary   Musculoskeletal   Abdominal   Peds  Hematology negative hematology ROS (+)   Anesthesia Other Findings   Reproductive/Obstetrics negative OB ROS                             Anesthesia Physical Anesthesia Plan  ASA: 3  Anesthesia Plan: General and General LMA   Post-op Pain Management:    Induction:   PONV Risk Score and Plan: Ondansetron  Airway Management Planned:   Additional Equipment:   Intra-op Plan:   Post-operative Plan:   Informed Consent: I have reviewed the patients History and Physical, chart, labs and discussed the procedure including the risks, benefits and alternatives for the proposed anesthesia with the patient or authorized representative who has indicated his/her understanding and acceptance.     Dental Advisory Given  Plan Discussed with: CRNA  Anesthesia Plan Comments:        Anesthesia Quick Evaluation

## 2022-05-16 NOTE — Op Note (Signed)
Open treatment internal fixation of the hip with CEPHALOMEDULLARY nail  Implant Name Type Inv. Item Serial No. Manufacturer Lot No. LRB No. Used Action  SCREW LOCK CORT 5X36 - SSTERILE ON SET Screw SCREW LOCK CORT 5X36 STER[employer] ON SET [employer]  Right 1 Implanted  NAIL RIGHT [id number] - SSTERILE ON SET Nail NAIL RIGHT 11X125 STER[employer] ON SET [employer]  Right 1 Implanted  SCREW LOCK LAG FEM 10.5X90 - SSTERILE ON SET Screw SCREW LOCK LAG FEM 10.5X90 [employer]ER[employer] ON SET [employer]  Right 1 Implanted    05/16/2022 11:38 AM  Transexamic acid was used Preop diagnosis right hip fracture intertrochanteric  Postoperative diagnosis same Procedure open treatment internal fixation of the  right  hip Surgeon Romeo Apple Assistants none Anesthesia general  Surgical findings 3 PART  INTERTROCHANTERIC HIP FRACTURE   BLOOD ADMINISTERED:none  DRAINS: none   LOCAL MEDICATIONS USED:  MARCAINE WITH EPI   SPECIMEN:  No Specimen  DISPOSITION OF SPECIMEN:  N/A  COUNTS:  CORRECT   DICTATION: .Dragon Dictation   Surgeon Romeo Apple  The patient was taken to the recovery room in stable condition  PLAN OF CARE: Discharge to home after PACU  PATIENT DISPOSITION:  PACU - hemodynamically stable.   Delay start of Pharmacological VTE agent (>24hrs) due to surgical blood loss or risk of bleeding: N/A    The surgery was done as follows: The patient was identified in the preop area the surgical site (right hip) was confirmed and marked after thorough chart review including radiographs; implants were checked personally.  The patient was brought back to the operating room for anesthesia which was general, a Foley catheter was inserted and then the patient was placed on the fracture table. The operative leg was placed in traction the nonoperative leg was padded and placed in a boot and abducted  The C-arm was brought in and a closed reduction was performed with the C-arm.  Multiplane x-rays were taken and the leg was  manipulated until a stable reduction was obtained using traction to obtain proper neck shaft angle and then rotation to correct rotational malalignment  The right leg was prepped and draped with sterile solution. This was followed by timeout which confirmed as surgical site, implants, x-ray gowns and badges.  The incision was made over the right hip at the greater trochanter proximally, subcutaneous tissue was divided down to the fascial layer which was split in line with the skin and incision.  This was followed by blunt dissection with a finger down to the tip of the trochanter  The sharp tipped awl was passed down to the level lesser TROCHANTER  followed by insertion of a guidewire down to the knee.  X-ray confirmed position of the guidewire and proximal reaming was performed down to the level of the lesser trochanter.  We then passed a 125 degree nail  A second incision was made distal to the initial incision and the subcutaneous tissue was divided, muscle and fascia were split down to bone   After correction for rotation,  insertion of a threaded guidewire which was placed in the center of the femoral head on AP and lateral x-ray was inserted  We measured the pin distance at 98 mm and then set the reamer to 90 mm, followed by reaming over the guidewire.  The lag screw was then placed over the guidewire and the guidewire was removed.   We next use the external guide to place a third incision and then passed a cannula  with a sharp tip followed by drilling with the appropriate drill followed by insertion of locking screw   Final images confirmed reduction of the fracture and hardware position.  The wound was irrigated with copious amounts of saline and closed in layered fashion starting with #1 Braylon, 0 Monocryl and 2-0 Monocryl and Steri-Strips  Marcaine with epinephrine was injected under the fascial layer and at each incision  A sterile bandage was applied  Postoperative  plan Weightbearing as tolerated  Anticoagulation for 35 days Follow-up visit at 4 weeks for x-rays and then x-rays at  12 weeks [id number]  SURGEON:  Surgeon(s) and Role:    Vickki Hearing, MD - Primary

## 2022-05-16 NOTE — Brief Op Note (Signed)
05/15/2022 - 05/16/2022  11:37 AM  PATIENT:  Brittany [name]  89 y.o. female  PRE-OPERATIVE DIAGNOSIS:  right hip fracture intertrochanteric  POST-OPERATIVE DIAGNOSIS:  right hip fracture intertrochanteric  PROCEDURE:  Procedure(s) with comments: INTRAMEDULLARY (IM) NAIL INTERTROCHANTERIC (Right) - on eliquis needs general  SURGEON:  Surgeon(s) and Role:    * Vickki Hearing, MD - Primary  PHYSICIAN ASSISTANT:   ASSISTANTS: none   ANESTHESIA:   general  EBL:  150 mL   BLOOD ADMINISTERED:none  DRAINS: no  LOCAL MEDICATIONS USED:  MARCAINE     SPECIMEN:  No Specimen  DISPOSITION OF SPECIMEN:  N/A  COUNTS:  YES  TOURNIQUET:  * No tourniquets in log *  DICTATION: .Dragon Dictation  PLAN OF CARE: Admit to inpatient   PATIENT DISPOSITION:  PACU - hemodynamically stable.   Delay start of Pharmacological VTE agent (>24hrs) due to surgical blood loss or risk of bleeding: not applicable

## 2022-05-16 NOTE — Progress Notes (Addendum)
PROGRESS NOTE     Brittany [name], is a 89 y.o. female, DOB - 08/17/1927, RUE:[id number]  Admit date - 05/15/2022   Admitting Physician Taron Conrey Mariea Clonts, MD  Outpatient Primary MD for the patient is Tracey Harries, MD  LOS - 1  Chief Complaint  Patient presents with   Fall        Brief Narrative:   89 y.o. female with past medical history relevant for CAD, AF paroxysmal atrial flutter/A-fib on chronic anticoagulation with Eliquis, history of complete heart block with prior [facility] pacemaker placement, HTN, diastolic dysfunction CHF, DM2 admitted on 05/15/2022 fter mechanical fall with right hip fracture    -Assessment and Plan: 1)Rt Hip Fx--- Status post fall with right hip fracture --CT C-spine and CT head without acute findings -Last dose of Eliquis 05/13/2020 4 PM dose -s/p  ORIF by Dr. Romeo Apple on 05/16/2022 -IV and oral pain medications as ordered =-Weightbearing as tolerated -Okay to restart PTA Eliquis on 05/18/2022   2)Leukocytosis--- .Marland Kitchen Suspect this is reactive after fall and acute hip fracture -Chest ray without infectious findings -No urinary symptoms   3) social/ethics--- plan of care and advanced directive discussed with HCPOA/ patient's daughter Ms. [name] at bedside -Despite previous DNR status - she would like to leave patient full code at this time   4)PAF/paroxysmal atrial flutter/H/o CHB--s/p [facility] pacemaker placement previously --- Eliquis on hold due to #1 above---restart on 05/18/2022 as above #1 -Rate controlled without negative chronotropic agents   5)DM2-last A1c 5.9 reflecting excellent diabetic control PTA -Hold metformin Use Novolog/Humalog Sliding scale insulin with Accu-Cheks/Fingersticks as ordered    6)HTN/CAD--prior balloon angioplasty LAD in 2007 -c/n isosorbide and Avapro --May use IV hydralazine as needed elevated BP   7)Dementia--- stable, patient with significant cognitive and memory deficits, supportive care advised    8)HFpEF/chronic diastolic dysfunction CHF--- echo from 03/20/2022 with EF of 60 to 65%, no aortic stenosis -Patient had mild mitral stenosis -Continue Lasix 40 mg daily  Status is: Inpatient   Disposition: The patient is from: Home              Anticipated d/c is to: Home              Anticipated d/c date is: 1 day              Patient currently is not medically stable to d/c. Barriers: Not Clinically Stable-   Code Status :  -  Code Status: Full Code   Family Communication:  daughter Ms. [name] is HCPOA  DVT Prophylaxis  :   - SCDs   SCDs Start: 05/16/22 1323 Place TED hose Start: 05/16/22 1323 SCDs Start: 05/15/22 1232 Place TED hose Start: 05/15/22 1232 apixaban (ELIQUIS) tablet 5 mg   Lab Results  Component Value Date   PLT 215 05/16/2022    Inpatient Medications  Scheduled Meds:  acetaminophen  500 mg Oral Q6H   [START ON 05/18/2022] apixaban  5 mg Oral BID   cyanocobalamin  1,000 mcg Oral Daily   docusate sodium  100 mg Oral BID   famotidine  20 mg Oral Daily   [START ON 05/17/2022] furosemide  40 mg Oral Daily   insulin aspart  0-5 Units Subcutaneous QHS   insulin aspart  0-6 Units Subcutaneous TID WC   irbesartan  37.5 mg Oral Daily   isosorbide mononitrate  30 mg Oral Daily   methocarbamol  500 mg Oral TID   multivitamin with minerals  1 tablet  Oral Daily   potassium chloride SA  20 mEq Oral Daily   senna-docusate  2 tablet Oral BID   sodium chloride flush  3 mL Intravenous Q12H   sodium chloride flush  3 mL Intravenous Q12H   traZODone  75 mg Oral QHS   Continuous Infusions:  sodium chloride 100 mL/hr at 05/16/22 1505   vancomycin     PRN Meds:.[START ON 05/17/2022] acetaminophen, bisacodyl, hydrALAZINE, HYDROcodone-acetaminophen, HYDROcodone-acetaminophen, menthol-cetylpyridinium **OR** phenol, metoCLOPramide **OR** metoCLOPramide (REGLAN) injection, morphine injection, ondansetron **OR** ondansetron (ZOFRAN) IV, oxyCODONE, polyethylene glycol, sodium  chloride flush   Anti-infectives (From admission, onward)    Start     Dose/Rate Route Frequency Ordered Stop   05/16/22 2000  vancomycin (VANCOCIN) IVPB 1000 mg/200 mL premix        1,000 mg 200 mL/hr over 60 Minutes Intravenous Every 12 hours 05/16/22 1322 05/17/22 0759   05/16/22 0930  vancomycin (VANCOCIN) IVPB 1000 mg/200 mL premix        1,000 mg 200 mL/hr over 60 Minutes Intravenous On call to O.R. 05/16/22 0839 05/16/22 1008   05/16/22 0930  gentamicin (GARAMYCIN) 320 mg in dextrose 5 % 100 mL IVPB        5 mg/kg  64 kg 108 mL/hr over 60 Minutes Intravenous On call to O.R. 05/16/22 0839 05/16/22 1000   05/16/22 0844  vancomycin (VANCOCIN) 1-5 GM/200ML-% IVPB       Note to Pharmacy: Laureen Ochs R: cabinet override      05/16/22 0844 05/16/22 0900         Subjective: Brittany [name] today has no fevers, no emesis,  No chest pain,   - Resting comfortably postoperatively -Complains of her room being too cold, I turned up the thermostat, and covered her  Objective: Vitals:   05/16/22 1230 05/16/22 1245 05/16/22 1309 05/16/22 1900  BP: (!) 124/58 (!) 116/48 (!) 95/40 (!) 120/39  Pulse: 73  75 75  Resp: 19 13 16 16   Temp:  98 F (36.7 C) 98.3 F (36.8 C) 98.4 F (36.9 C)  TempSrc:   Oral Oral  SpO2: 99% 100% 100% 100%  Weight:      Height:        Intake/Output Summary (Last 24 hours) at 05/16/2022 2037 Last data filed at 05/16/2022 2003 Gross per 24 hour  Intake 1208 ml  Output 700 ml  Net 508 ml   Filed Weights   05/15/22 0627 05/16/22 0812  Weight: 64 kg 64 kg   Physical Exam  Physical Examination: General appearance - alert,  in no distress, pleasantly confused Mental status -memory and cognitive deficits, pleasantly confused and disoriented Eyes - sclera anicteric Neck - supple, no JVD elevation , Chest - clear  to auscultation bilaterally, symmetrical air movement,  Heart - S1 and S2 normal, regular , pacemaker in situ Abdomen - soft, nontender,  nondistended, +BS Neurological -Limited movement of right lower extremity due to pain otherwise no new focal deficits Extremities - no pedal edema noted, intact peripheral pulses  Skin - warm, dry MSK-right hip wound hemostatic  Data Reviewed: I have personally reviewed following labs and imaging studies  CBC: Recent Labs  Lab 05/15/22 0637 05/16/22 0537  WBC 12.4* 12.0*  NEUTROABS 9.4*  --   HGB 12.2 11.6*  HCT 37.2 35.3*  MCV 91.0 90.5  PLT 237 215   Basic Metabolic Panel: Recent Labs  Lab 05/15/22 0637 05/16/22 0537  NA 140 139  K 3.6 4.1  CL 103 106  CO2 27 26  GLUCOSE 198* 143*  BUN 13 13  CREATININE 0.70 0.76  CALCIUM 10.6* 10.1   GFR: Estimated Creatinine Clearance: 38.6 mL/min (by C-G formula based on SCr of 0.76 mg/dL).  Radiology Studies: DG HIP UNILAT WITH PELVIS 2-3 VIEWS RIGHT  Result Date: 05/16/2022 CLINICAL DATA:  Postop right hip fracture fixation. EXAM: DG HIP (WITH OR WITHOUT PELVIS) 2-3V RIGHT COMPARISON:  Preoperative imaging. FINDINGS: Femoral intramedullary nail with trans trochanteric and distal locking screw fixation traverse proximal femur fracture. There is improved fracture alignment from preoperative imaging. Recent postsurgical change includes air and edema in the soft tissues. Previous left femoral fixation hardware. IMPRESSION: ORIF of proximal femur fracture with improved alignment from preoperative imaging. Electronically Signed   By: Narda Rutherford M.D.   On: 05/16/2022 12:31   DG HIP UNILAT WITH PELVIS 2-3 VIEWS RIGHT  Result Date: 05/16/2022 CLINICAL DATA:  Right hip fracture. EXAM: DG HIP (WITH OR WITHOUT PELVIS) 2-3V RIGHT COMPARISON:  Preoperative imaging FINDINGS: Nine fluoroscopic spot images obtained in the operating room the pelvis and right hip. Sequential images during intramedullary nail with trans trochanteric and distal locking screw fixation of proximal femur fracture. Fluoroscopy time 1.3 minutes. Dose 25.38 mGy  IMPRESSION: Procedural fluoroscopy during right hip fracture ORIF. Electronically Signed   By: Narda Rutherford M.D.   On: 05/16/2022 12:04   DG C-Arm 1-60 Min-No Report  Result Date: 05/16/2022 Fluoroscopy was utilized by the requesting physician.  No radiographic interpretation.   DG Hip Unilat With Pelvis 2-3 Views Right  Result Date: 05/15/2022 CLINICAL DATA:  89 year old female status post unwitnessed fall. EXAM: DG HIP (WITH OR WITHOUT PELVIS) 2-3V RIGHT COMPARISON:  Pelvis CT 03/19/2022. FINDINGS: Chronic left femur ORIF with mostly visible intramedullary rod, proximal interlocking dynamic hip screw and distal interlocking cortical screw. Grossly intact proximal left femur, hardware. Pelvis osteopenia. No acute pelvis fracture identified. Acute comminuted and impacted right femur intertrochanteric fracture. Varus impaction, right femoral head appears to remain normally located within the acetabulum. Calcified femoral artery atherosclerosis. Negative visible bowel gas. IMPRESSION: 1. Acute comminuted Right femur intertrochanteric fracture with varus impaction. 2. Chronic left femur ORIF. No acute pelvis fracture identified. Electronically Signed   By: Odessa Fleming M.D.   On: 05/15/2022 07:38   DG Chest 1 View  Result Date: 05/15/2022 CLINICAL DATA:  89 year old female status post unwitnessed fall. EXAM: CHEST  1 VIEW COMPARISON:  Cervical spine CT today.  Portable chest 02/28/2022. FINDINGS: Portable AP supine view at 0657 hours. Larger lung volumes. Stable left chest dual lead cardiac pacemaker. Stable cardiomegaly and mediastinal contours. Visualized tracheal air column is within normal limits. Allowing for portable technique the lungs are clear; calcified left lower lung granuloma. Osteopenia. No acute osseous abnormality identified. Paucity of bowel gas in the upper abdomen. IMPRESSION: 1. No acute cardiopulmonary abnormality or acute traumatic injury identified. 2. Stable cardiomegaly.  Left chest  pacemaker. Electronically Signed   By: Odessa Fleming M.D.   On: 05/15/2022 07:37   CT CERVICAL SPINE WO CONTRAST  Result Date: 05/15/2022 CLINICAL DATA:  89 year old female status post unwitnessed fall. EXAM: CT CERVICAL SPINE WITHOUT CONTRAST TECHNIQUE: Multidetector CT imaging of the cervical spine was performed without intravenous contrast. Multiplanar CT image reconstructions were also generated. RADIATION DOSE REDUCTION: This exam was performed according to the departmental dose-optimization program which includes automated exposure control, adjustment of the mA and/or kV according to patient size and/or use of iterative reconstruction technique. COMPARISON:  Head CT today. FINDINGS:  Alignment: Cervicothoracic junction alignment is within normal limits. Straightening of cervical lordosis above C7. Bilateral posterior element alignment is within normal limits. Skull base and vertebrae: Osteopenia. Mild skull base motion artifact. Visualized skull base is intact. No atlanto-occipital dissociation. C1 and C2 appear intact and aligned. No acute osseous abnormality identified. Soft tissues and spinal canal: No prevertebral fluid or swelling. No visible canal hematoma. Partially retropharyngeal carotid arteries with mild for age calcified atherosclerosis. Disc levels: C5-C6 acquired appearing interbody ankylosis. Mild for age cervical spine degeneration elsewhere, and capacious appearance of the cervical spinal canal. Upper chest: Osteopenia. Grossly intact visible upper thoracic levels. Left chest cardiac pacemaker type device. Lungs appear clear with mild respiratory motion. Calcified aortic atherosclerosis. IMPRESSION: 1. No acute traumatic injury identified in the cervical spine. 2. Mild for age cervical spine degeneration. Acquired C5-C6 ankylosis. 3.  Aortic Atherosclerosis (ICD10-I70.0). Electronically Signed   By: Odessa Fleming M.D.   On: 05/15/2022 07:36   CT HEAD WO CONTRAST  Result Date: 05/15/2022 CLINICAL  DATA:  89 year old female status post unwitnessed fall. Dementia. EXAM: CT HEAD WITHOUT CONTRAST TECHNIQUE: Contiguous axial images were obtained from the base of the skull through the vertex without intravenous contrast. RADIATION DOSE REDUCTION: This exam was performed according to the departmental dose-optimization program which includes automated exposure control, adjustment of the mA and/or kV according to patient size and/or use of iterative reconstruction technique. COMPARISON:  Head CT 02/28/2022. FINDINGS: Brain: Stable cerebral volume, chronic temporal lobe atrophy. No midline shift, ventriculomegaly, mass effect, evidence of mass lesion, intracranial hemorrhage or evidence of cortically based acute infarction. Gray-white differentiation is stable and within normal limits for age. Vascular: Calcified atherosclerosis at the skull base. No suspicious intracranial vascular hyperdensity. Skull: Hyperostosis of the calvarium. No acute osseous abnormality identified. Sinuses/Orbits: Visualized paranasal sinuses and mastoids are stable and well aerated. Other: Orbit and scalp soft tissues appears stable. IMPRESSION: 1. No acute intracranial abnormality or acute traumatic injury identified. 2. Chronic temporal lobe atrophy.  Cerebral Atrophy (ICD10-G31.9). Electronically Signed   By: Odessa Fleming M.D.   On: 05/15/2022 07:16    Scheduled Meds:  acetaminophen  500 mg Oral Q6H   [START ON 05/18/2022] apixaban  5 mg Oral BID   cyanocobalamin  1,000 mcg Oral Daily   docusate sodium  100 mg Oral BID   famotidine  20 mg Oral Daily   [START ON 05/17/2022] furosemide  40 mg Oral Daily   insulin aspart  0-5 Units Subcutaneous QHS   insulin aspart  0-6 Units Subcutaneous TID WC   irbesartan  37.5 mg Oral Daily   isosorbide mononitrate  30 mg Oral Daily   methocarbamol  500 mg Oral TID   multivitamin with minerals  1 tablet Oral Daily   potassium chloride SA  20 mEq Oral Daily   senna-docusate  2 tablet Oral BID    sodium chloride flush  3 mL Intravenous Q12H   sodium chloride flush  3 mL Intravenous Q12H   traZODone  75 mg Oral QHS   Continuous Infusions:  sodium chloride 100 mL/hr at 05/16/22 1505   vancomycin      LOS: 1 day   Shon Hale M.D on 05/16/2022 at 8:37 PM  Go to [email or web address] - for contact info  [facility] - Office  [phone]  If 7PM-7AM, please contact night-coverage [email or web address] 05/16/2022, 8:37 PM

## 2022-05-16 NOTE — TOC Initial Note (Signed)
Transition of Care [facility]) - Initial/Assessment Note    Patient Details  Name: Brittany [name] MRN: [id number] Date of Birth: April 13, 1927  Transition of Care [facility]) CM/SW Contact:    Elliot Gault, LCSW Phone Number: 05/16/2022, 3:46 PM  Clinical Narrative:                  Pt admitted from home. She had surgical repair of hip fracture earlier today. PT will evaluate pt tomorrow. Per MD, daughter had mentioned maybe taking pt home with [facility] if pt doing well enough tomorrow.  Spoke with pt's daughter, [name], to assess. Pt and daughter live together. Pt has an adjustable bed at home. She also has a walker and wheelchair. Daughter states pt has recently been at [facility] for rehab and she would be agreeable to pt returning there if she cannot manage pt's care at home upon dc. Daughter states she would be interested in taking her home with [facility] if PT thinks that is possible for pt.  Dtr agreeable to [facility] sending out referral to [facility] today. TOC will need to initiate insurance authorization tomorrow if PT recommends SNF and daughter decides that is what she would like for pt.  TOC will follow.   Barriers to Discharge: Continued Medical Work up   Patient Goals and CMS Choice Patient states their goals for this hospitalization and ongoing recovery are:: rehab vs home with [facility] CMS Medicare.gov Compare Post Acute Care list provided to:: Patient Represenative (must comment) Choice offered to / list presented to : Adult Children      Expected Discharge Plan and Services In-house Referral: Clinical Social Work     Living arrangements for the past 2 months: Single Family Home                                      Prior Living Arrangements/Services Living arrangements for the past 2 months: Single Family Home Lives with:: Adult Children Patient language and need for interpreter reviewed:: Yes Do you feel safe going back to the place where you live?: Yes      Need for Family  Participation in Patient Care: Yes (Comment) Care giver support system in place?: Yes (comment) Current home services: DME Criminal Activity/Legal Involvement Pertinent to Current Situation/Hospitalization: No - Comment as needed  Activities of Daily Living Home Assistive Devices/Equipment: Dan Humphreys (specify type) ADL Screening (condition at time of admission) Patient's cognitive ability adequate to safely complete daily activities?: No Is the patient deaf or have difficulty hearing?: No Does the patient have difficulty seeing, even when wearing glasses/contacts?: No Does the patient have difficulty concentrating, remembering, or making decisions?: Yes Patient able to express need for assistance with ADLs?: Yes Does the patient have difficulty dressing or bathing?: Yes Independently performs ADLs?: No Communication: Independent Dressing (OT): Needs assistance Is this a change from baseline?: Pre-admission baseline Grooming: Needs assistance Is this a change from baseline?: Pre-admission baseline Feeding: Independent Bathing: Needs assistance Is this a change from baseline?: Pre-admission baseline Toileting: Needs assistance Is this a change from baseline?: Pre-admission baseline In/Out Bed: Independent Walks in Home: Independent with device (comment) Does the patient have difficulty walking or climbing stairs?: Yes Weakness of Legs: None Weakness of Arms/Hands: None  Permission Sought/Granted Permission sought to share information with : Facility Industrial/product designer granted to share information with : Yes, Verbal Permission Granted     Permission granted to  share info w AGENCY: [facility] and [facility]        Emotional Assessment       Orientation: : Oriented to Self Alcohol / Substance Use: Not Applicable Psych Involvement: No (comment)  Admission diagnosis:  Hip fracture (HCC) [S72.009A] Patient Active Problem List   Diagnosis Date Noted   Hip fracture (HCC)  05/15/2022   Pancreatic lesion 04/24/2022   Regurgitation of food 04/24/2022   Preop cardiovascular exam 03/21/2022   Incarcerated right inguinal hernia 03/19/2022   Lactic acidosis 03/19/2022   LBBB (left bundle branch block) 03/19/2022   Type 2 diabetes mellitus with hyperglycemia (HCC) 03/19/2022   Constipation 03/19/2022   Obesity (BMI 30-39.9) 03/19/2022   Prolonged QT interval 03/19/2022   Acute metabolic encephalopathy 02/28/2022   Hypokalemia 02/28/2022   Leg swelling 06/22/2020   Atrial flutter (HCC) 05/06/2020   CHF (congestive heart failure) (HCC) 01/02/2015   Chronic diastolic CHF (congestive heart failure) (HCC) 01/02/2015   DM2 (diabetes mellitus, type 2) (HCC) 01/02/2015   Belching 10/22/2012   SOB (shortness of breath) 10/22/2012   Lower extremity numbness 06/20/2012   Closed left hip fracture (HCC) 01/18/2012   HYPERSOMNIA UNSPECIFIED 06/23/2008   EDEMA 06/23/2008   GLAUCOMA 05/27/2008   LAD-balloon angioplasty/cutting balloon-2007 05/27/2008   AV BLOCK, COMPLETE 05/27/2008   Pacemaker 05/27/2008   HYPERLIPIDEMIA 11/05/2006   Essential hypertension 11/05/2006   PCP:  Tracey Harries, MD Pharmacy:   [facility] [place], [place] - [address] [place] [address] Phone: [phone] Fax: [phone]     Social Determinants of Health (SDOH) Social History: SDOH Screenings   Food Insecurity: No Food Insecurity (05/15/2022)  Housing: Low Risk  (05/15/2022)  Transportation Needs: No Transportation Needs (05/15/2022)  Utilities: Not At Risk (05/15/2022)  Tobacco Use: Medium Risk (05/16/2022)   SDOH Interventions:     Readmission Risk Interventions    03/02/2022   11:30 AM  Readmission Risk Prevention Plan  Post Dischage Appt Not Complete  Medication Screening Complete  Transportation Screening Complete

## 2022-05-17 DIAGNOSIS — I2510 Atherosclerotic heart disease of native coronary artery without angina pectoris: Secondary | ICD-10-CM

## 2022-05-17 DIAGNOSIS — I4892 Unspecified atrial flutter: Secondary | ICD-10-CM

## 2022-05-17 DIAGNOSIS — E1169 Type 2 diabetes mellitus with other specified complication: Secondary | ICD-10-CM

## 2022-05-17 LAB — CBC
HCT: 29.6 % — ABNORMAL LOW (ref 36.0–46.0)
Hemoglobin: 9.5 g/dL — ABNORMAL LOW (ref 12.0–15.0)
MCH: 29.7 pg (ref 26.0–34.0)
MCHC: 32.1 g/dL (ref 30.0–36.0)
MCV: 92.5 fL (ref 80.0–100.0)
Platelets: 141 10*3/uL — ABNORMAL LOW (ref 150–400)
RBC: 3.2 MIL/uL — ABNORMAL LOW (ref 3.87–5.11)
RDW: 14.9 % (ref 11.5–15.5)
WBC: 10.1 10*3/uL (ref 4.0–10.5)
nRBC: 0 % (ref 0.0–0.2)

## 2022-05-17 LAB — BASIC METABOLIC PANEL
Anion gap: 6 (ref 5–15)
BUN: 15 mg/dL (ref 8–23)
CO2: 25 mmol/L (ref 22–32)
Calcium: 9.2 mg/dL (ref 8.9–10.3)
Chloride: 105 mmol/L (ref 98–111)
Creatinine, Ser: 0.67 mg/dL (ref 0.44–1.00)
GFR, Estimated: >60 mL/min (ref >60)
Glucose, Bld: 114 mg/dL — ABNORMAL HIGH (ref 70–99)
Potassium: 4 mmol/L (ref 3.5–5.1)
Sodium: 136 mmol/L (ref 135–145)

## 2022-05-17 LAB — GLUCOSE, CAPILLARY
Glucose-Capillary: 129 mg/dL — ABNORMAL HIGH (ref 70–99)
Glucose-Capillary: 164 mg/dL — ABNORMAL HIGH (ref 70–99)
Glucose-Capillary: 161 mg/dL — ABNORMAL HIGH (ref 70–99)
Glucose-Capillary: 194 mg/dL — ABNORMAL HIGH (ref 70–99)

## 2022-05-17 MED ORDER — POLYSACCHARIDE IRON COMPLEX 150 MG PO CAPS
150.0000 mg | ORAL_CAPSULE | Freq: Every day | ORAL | Status: DC
  Administered 2022-05-17 – 2022-05-18 (×2): 150 mg via ORAL
  Filled 2022-05-17 (×2): qty 1

## 2022-05-17 NOTE — Progress Notes (Signed)
PROGRESS NOTE     Brittany [name], is a 89 y.o. female, DOB - 1927/07/21, WGN:[id number]  Admit date - 05/15/2022   Admitting Physician Courage Mariea Clonts, MD  Outpatient Primary MD for the patient is Tracey Harries, MD  LOS - 2  Chief Complaint  Patient presents with   Fall        Brief Narrative:   89 y.o. female with past medical history relevant for CAD, AF paroxysmal atrial flutter/A-fib on chronic anticoagulation with Eliquis, history of complete heart block with prior [facility] pacemaker placement, HTN, diastolic dysfunction CHF, DM2 admitted on 05/15/2022 fter mechanical fall with right hip fracture    -Assessment and Plan: 1)Rt Hip Fx--- Status post fall with right hip fracture --CT C-spine and CT head without acute findings -s/p  ORIF by Dr. Romeo Apple on 05/16/2022 -IV and oral pain medications as ordered =-Weightbearing as tolerated -Okay to restart PTA Eliquis on 05/18/2022 -Physical therapy recommending skilled nursing facility for rehabilitation.   2)Leukocytosis--- .Marland Kitchen Suspect this is reactive after fall and acute hip fracture. -No signs of acute infection appreciated. -Chest ray without infectious findings -Continue holding antibiotic therapy.   3) social/ethics--- plan of care and advanced directive discussed with HCPOA/ patient's daughter Ms. Brittany [name] at bedside -Despite previous DNR status - she would like to leave patient full code at this time. -Further goals of care/advance care planning discussion in the outpatient setting recommended.   4)PAF/paroxysmal atrial flutter/H/o CHB--s/p [employer] pacemaker placement previously --- Eliquis on hold due to #1 above---restart on 05/18/2022 as above #1 -Rate controlled without negative chronotropic agents. -Continue monitoring.   5)DM2-last A1c 5.9 reflecting excellent diabetic control PTA -Hold metformin -Continue sliding scale insulin while inpatient -Follow CBG fluctuation.   6)HTN/CAD--prior balloon angioplasty  LAD in 2007 -c/n isosorbide and Avapro -Vital signs stable; patient denies chest pain.   7)Dementia--- stable, patient with significant cognitive and memory deficits, supportive care advised   8)HFpEF/chronic diastolic dysfunction CHF--- echo from 03/20/2022 with EF of 60 to 65%, no aortic stenosis -Patient had mild mitral stenosis -Continue Lasix 40 mg daily  9) acute postoperative anemia -Hemoglobin down to 9.5 -No blood transfusion needed -Start daily Niferex -Follow hemoglobin trend.  Status is: Inpatient   Disposition: The patient is from: Home              Anticipated d/c is to: Home              Anticipated d/c date is: 1 day              Patient currently is not medically stable to d/c. Barriers: Not Clinically Stable-   Code Status :  -  Code Status: Full Code   Family Communication:  daughter Ms. Brittany [name] is HCPOA  DVT Prophylaxis  :   - SCDs   SCDs Start: 05/16/22 1323 Place TED hose Start: 05/16/22 1323 SCDs Start: 05/15/22 1232 Place TED hose Start: 05/15/22 1232 apixaban (ELIQUIS) tablet 5 mg   Lab Results  Component Value Date   PLT 141 (L) 05/17/2022    Inpatient Medications  Scheduled Meds:  [START ON 05/18/2022] apixaban  5 mg Oral BID   cyanocobalamin  1,000 mcg Oral Daily   docusate sodium  100 mg Oral BID   famotidine  20 mg Oral Daily   furosemide  40 mg Oral Daily   insulin aspart  0-5 Units Subcutaneous QHS   insulin aspart  0-6 Units Subcutaneous TID WC   irbesartan  37.5  mg Oral Daily   iron polysaccharides  150 mg Oral Daily   isosorbide mononitrate  30 mg Oral Daily   methocarbamol  500 mg Oral TID   multivitamin with minerals  1 tablet Oral Daily   potassium chloride SA  20 mEq Oral Daily   senna-docusate  2 tablet Oral BID   sodium chloride flush  3 mL Intravenous Q12H   sodium chloride flush  3 mL Intravenous Q12H   traZODone  75 mg Oral QHS   Continuous Infusions:   PRN Meds:.acetaminophen, bisacodyl, hydrALAZINE,  HYDROcodone-acetaminophen, HYDROcodone-acetaminophen, menthol-cetylpyridinium **OR** phenol, metoCLOPramide **OR** metoCLOPramide (REGLAN) injection, morphine injection, ondansetron **OR** ondansetron (ZOFRAN) IV, oxyCODONE, polyethylene glycol, sodium chloride flush   Anti-infectives (From admission, onward)    Start     Dose/Rate Route Frequency Ordered Stop   05/16/22 2000  vancomycin (VANCOCIN) IVPB 1000 mg/200 mL premix        1,000 mg 200 mL/hr over 60 Minutes Intravenous Every 12 hours 05/16/22 1322 05/16/22 2202   05/16/22 0930  vancomycin (VANCOCIN) IVPB 1000 mg/200 mL premix        1,000 mg 200 mL/hr over 60 Minutes Intravenous On call to O.R. 05/16/22 0839 05/16/22 1008   05/16/22 0930  gentamicin (GARAMYCIN) 320 mg in dextrose 5 % 100 mL IVPB        5 mg/kg  64 kg 108 mL/hr over 60 Minutes Intravenous On call to O.R. 05/16/22 0839 05/16/22 1000   05/16/22 0844  vancomycin (VANCOCIN) 1-5 GM/200ML-% IVPB       Note to Pharmacy: Laureen Ochs R: cabinet override      05/16/22 0844 05/16/22 0900         Subjective: Vida Roller no fever, no chest pain, no nausea, no vomiting.  Intermittent complaints of right hip pain reported.  Analgesics adequately controlling discomfort.  Objective: Vitals:   05/17/22 0418 05/17/22 0947 05/17/22 1458 05/17/22 1500  BP: 104/88 (!) 95/45 (!) 98/48 96/70  Pulse: 73 73 72   Resp: 16 18 17    Temp: 98.4 F (36.9 C)  98.4 F (36.9 C)   TempSrc: Oral  Oral   SpO2: 94%  94%   Weight:      Height:        Intake/Output Summary (Last 24 hours) at 05/17/2022 1549 Last data filed at 05/16/2022 2355 Gross per 24 hour  Intake --  Output 200 ml  Net -200 ml   Filed Weights   05/15/22 0627 05/16/22 0812  Weight: 64 kg 64 kg   Physical Exam General exam: Alert, awake and adequately following commands.  Appears pleasantly confused (baseline from her dementia).  No fever. Respiratory system: No using accessory muscles; respiratory effort  normal.  Good saturation appreciated. Cardiovascular system: Rate controlled; no rubs, no gallops, no JVD. Gastrointestinal system: Abdomen is nondistended, soft and nontender. No organomegaly or masses felt. Normal bowel sounds heard. Central nervous system: Alert and oriented. No focal neurological deficits. Extremities: No cyanosis, clubbing or edema. Skin: No petechiae.  Right lateral dressings in place, clean and dry. Psychiatry: Judgement and insight appear impaired secondary to underlying dementia.  Data Reviewed: I have personally reviewed following labs and imaging studies  CBC: Recent Labs  Lab 05/15/22 0637 05/16/22 0537 05/17/22 0401  WBC 12.4* 12.0* 10.1  NEUTROABS 9.4*  --   --   HGB 12.2 11.6* 9.5*  HCT 37.2 35.3* 29.6*  MCV 91.0 90.5 92.5  PLT 237 215 141*   Basic Metabolic Panel: Recent Labs  Lab 05/15/22 0637 05/16/22 0537 05/17/22 0401  NA 140 139 136  K 3.6 4.1 4.0  CL 103 106 105  CO2 27 26 25   GLUCOSE 198* 143* 114*  BUN 13 13 15   CREATININE 0.70 0.76 0.67  CALCIUM 10.6* 10.1 9.2   GFR: Estimated Creatinine Clearance: 38.6 mL/min (by C-G formula based on SCr of 0.67 mg/dL).  Radiology Studies: DG HIP UNILAT WITH PELVIS 2-3 VIEWS RIGHT  Result Date: 05/16/2022 CLINICAL DATA:  Postop right hip fracture fixation. EXAM: DG HIP (WITH OR WITHOUT PELVIS) 2-3V RIGHT COMPARISON:  Preoperative imaging. FINDINGS: Femoral intramedullary nail with trans trochanteric and distal locking screw fixation traverse proximal femur fracture. There is improved fracture alignment from preoperative imaging. Recent postsurgical change includes air and edema in the soft tissues. Previous left femoral fixation hardware. IMPRESSION: ORIF of proximal femur fracture with improved alignment from preoperative imaging. Electronically Signed   By: Narda Rutherford M.D.   On: 05/16/2022 12:31   DG HIP UNILAT WITH PELVIS 2-3 VIEWS RIGHT  Result Date: 05/16/2022 CLINICAL DATA:  Right  hip fracture. EXAM: DG HIP (WITH OR WITHOUT PELVIS) 2-3V RIGHT COMPARISON:  Preoperative imaging FINDINGS: Nine fluoroscopic spot images obtained in the operating room the pelvis and right hip. Sequential images during intramedullary nail with trans trochanteric and distal locking screw fixation of proximal femur fracture. Fluoroscopy time 1.3 minutes. Dose 25.38 mGy IMPRESSION: Procedural fluoroscopy during right hip fracture ORIF. Electronically Signed   By: Narda Rutherford M.D.   On: 05/16/2022 12:04   DG C-Arm 1-60 Min-No Report  Result Date: 05/16/2022 Fluoroscopy was utilized by the requesting physician.  No radiographic interpretation.    Scheduled Meds:  [START ON 05/18/2022] apixaban  5 mg Oral BID   cyanocobalamin  1,000 mcg Oral Daily   docusate sodium  100 mg Oral BID   famotidine  20 mg Oral Daily   furosemide  40 mg Oral Daily   insulin aspart  0-5 Units Subcutaneous QHS   insulin aspart  0-6 Units Subcutaneous TID WC   irbesartan  37.5 mg Oral Daily   iron polysaccharides  150 mg Oral Daily   isosorbide mononitrate  30 mg Oral Daily   methocarbamol  500 mg Oral TID   multivitamin with minerals  1 tablet Oral Daily   potassium chloride SA  20 mEq Oral Daily   senna-docusate  2 tablet Oral BID   sodium chloride flush  3 mL Intravenous Q12H   sodium chloride flush  3 mL Intravenous Q12H   traZODone  75 mg Oral QHS   Continuous Infusions:    LOS: 2 days   Vassie Loll M.D on 05/17/2022 at 3:49 PM  Go to [email or web address] - for contact info  [facility] - Office  [phone]  If 7PM-7AM, please contact night-coverage [email or web address] 05/17/2022, 3:49 PM

## 2022-05-17 NOTE — TOC Progression Note (Signed)
Transition of Care [facility]) - Progression Note    Patient Details  Name: Brittany [name] MRN: [id number] Date of Birth: [birth year]  Transition of Care [facility]) CM/SW Contact  Karn Cassis, [place] Phone Number: 05/17/2022, 10:18 AM  Clinical Narrative: PT recommending SNF. Daughter, [name] accepts bed offer at [facility]. Facility notified. CMA starting authorization.         Barriers to Discharge: Continued Medical Work up  Expected Discharge Plan and Services In-house Referral: Clinical Social Work     Living arrangements for the past 2 months: Single Family Home                                       Social Determinants of Health (SDOH) Interventions SDOH Screenings   Food Insecurity: No Food Insecurity (05/15/2022)  Housing: Low Risk  (05/15/2022)  Transportation Needs: No Transportation Needs (05/15/2022)  Utilities: Not At Risk (05/15/2022)  Tobacco Use: Medium Risk (05/16/2022)    Readmission Risk Interventions    03/02/2022   11:30 AM  Readmission Risk Prevention Plan  Post Dischage Appt Not Complete  Medication Screening Complete  Transportation Screening Complete

## 2022-05-17 NOTE — Progress Notes (Signed)
Subjective: 1 Day Post-Op Procedure(s) (LRB): INTRAMEDULLARY (IM) NAIL INTERTROCHANTERIC (Right) Patient reports pain as difficult to assess secondary to dementia but her daughter says that the pain medication that she is getting is controlling the pain  The patient is observed sitting upright eating independently.  Objective: Vital signs in last 24 hours: Temp:  [98 F (36.7 C)-98.4 F (36.9 C)] 98.4 F (36.9 C) (05/08 0418) Pulse Rate:  [71-76] 73 (05/08 0418) Resp:  [12-19] 16 (05/08 0418) BP: (95-131)/(39-88) 104/88 (05/08 0418) SpO2:  [94 %-100 %] 94 % (05/08 0418)  Intake/Output from previous day: 05/07 0701 - 05/08 0700 In: 1208 [I.V.:1000; IV Piggyback:208] Out: 550 [Urine:400; Blood:150] Intake/Output this shift: No intake/output data recorded.  Recent Labs    05/15/22 0637 05/16/22 0537 05/17/22 0401  HGB 12.2 11.6* 9.5*   Recent Labs    05/16/22 0537 05/17/22 0401  WBC 12.0* 10.1  RBC 3.90 3.20*  HCT 35.3* 29.6*  PLT 215 141*   Recent Labs    05/16/22 0537 05/17/22 0401  NA 139 136  K 4.1 4.0  CL 106 105  CO2 26 25  BUN 13 15  CREATININE 0.76 0.67  GLUCOSE 143* 114*  CALCIUM 10.1 9.2   Recent Labs    05/15/22 0637  INR 1.6*    Rotational alignment of the right leg is normal  Patient is awake and alert interacting well with her daughter but still confused   Assessment/Plan: 1 Day Post-Op Procedure(s) (LRB): INTRAMEDULLARY (IM) NAIL INTERTROCHANTERIC (Right) Advance diet Up with therapy D/C IV fluids Patient wants to return to [facility] where her relative is also they are and the patient was there 1 to 2 months ago after hernia repair  Acute blood loss anemia expected secondary to fluid and surgical procedure as well as fracture monitor  Discharge when bed available  Postop films reviewed and look appropriate  Postop plan No hip precautions Weight-bear as tolerated DVT prevention for 35 days postop Follow-up in 4  weeks Change dressing daily as needed   Fuller Canada 05/17/2022, 8:15 AM

## 2022-05-17 NOTE — Anesthesia Postprocedure Evaluation (Signed)
Anesthesia Post Note  Patient: Brittany [name]  Procedure(s) Performed: INTRAMEDULLARY (IM) NAIL INTERTROCHANTERIC (Right: Hip)  Patient location during evaluation: Phase II Anesthesia Type: General Level of consciousness: awake Pain management: pain level controlled Vital Signs Assessment: post-procedure vital signs reviewed and stable Respiratory status: spontaneous breathing and respiratory function stable Cardiovascular status: blood pressure returned to baseline and stable Postop Assessment: no headache and no apparent nausea or vomiting Anesthetic complications: no Comments: Late entry   No notable events documented.   Last Vitals:  Vitals:   05/17/22 0418 05/17/22 0947  BP: 104/88 (!) 95/45  Pulse: 73 73  Resp: 16 18  Temp: 36.9 C   SpO2: 94%     Last Pain:  Vitals:   05/17/22 0418  TempSrc: Oral  PainSc:                  Windell Norfolk

## 2022-05-17 NOTE — Evaluation (Signed)
Physical Therapy Evaluation Patient Details Name: Brittany [name] MRN: [id number] DOB: 01-10-1927 Today's Date: 05/17/2022  History of Present Illness  Brittany [name]  is a 89 y.o. female s/p ORIF right hip fracture on 05/16/22 with past medical history relevant for CAD, AF paroxysmal atrial flutter/A-fib on chronic anticoagulation with Eliquis, history of complete heart block with prior [facility] pacemaker placement, HTN, diastolic dysfunction CHF, DM2 presents to the ED by EMS with concerns for unwitnessed fall and right right hip pain and right lower extremity deformity----  -patient has dementia and is a poor historian  -History obtained from patient's daughter Ms. [name] at bedside   Clinical Impression  Patient demonstrates slow labored movement for sitting up at bedside with c/o severe right hip pain and left lower leg pain due pressure of compression stocking. Patient very unsteady on feet, at high risk for falls due to poor standing balance and tolerance for weightbearing on RLE.  Patient limited to a few shuffling side steps before having to sit due to buckling of legs/weakness.  Patient tolerated sitting up in chair with daughter present after therapy - nursing staff aware.  Patient will benefit from continued skilled physical therapy in hospital and recommended venue below to increase strength, balance, endurance for safe ADLs and gait.          Recommendations for follow up therapy are one component of a multi-disciplinary discharge planning process, led by the attending physician.  Recommendations may be updated based on patient status, additional functional criteria and insurance authorization.  Follow Up Recommendations Can patient physically be transported by private vehicle: No     Assistance Recommended at Discharge Intermittent Supervision/Assistance  Patient can return home with the following  A lot of help with bathing/dressing/bathroom;A lot of help with walking and/or  transfers;Help with stairs or ramp for entrance;Assistance with cooking/housework    Equipment Recommendations None recommended by PT  Recommendations for Other Services       Functional Status Assessment Patient has had a recent decline in their functional status and demonstrates the ability to make significant improvements in function in a reasonable and predictable amount of time.     Precautions / Restrictions Precautions Precautions: Fall Restrictions Weight Bearing Restrictions: Yes RLE Weight Bearing: Weight bearing as tolerated      Mobility  Bed Mobility Overal bed mobility: Needs Assistance Bed Mobility: Supine to Sit     Supine to sit: Mod assist, Max assist     General bed mobility comments: increased time, labored movement  with increased pain right hip    Transfers Overall transfer level: Needs assistance Equipment used: Rolling walker (2 wheels) Transfers: Sit to/from Stand, Bed to chair/wheelchair/BSC Sit to Stand: Mod assist, Max assist   Step pivot transfers: Mod assist, Max assist       General transfer comment: unsteady labored movement    Ambulation/Gait Ambulation/Gait assistance: Max assist Gait Distance (Feet): 4 Feet Assistive device: Rolling walker (2 wheels) Gait Pattern/deviations: Decreased step length - right, Decreased step length - left, Decreased stride length, Decreased stance time - right, Antalgic, Shuffle, Knees buckling Gait velocity: slow     General Gait Details: limited to a few slow labored unsteady side steps with poor tolerance for weightbearing on RLE due to increased pain  Stairs            Wheelchair Mobility    Modified Rankin (Stroke Patients Only)       Balance Overall balance assessment: Needs assistance Sitting-balance support: Feet  supported, No upper extremity supported Sitting balance-Leahy Scale: Fair Sitting balance - Comments: fair/good seated at EOB   Standing balance support: During  functional activity, Reliant on assistive device for balance, Bilateral upper extremity supported Standing balance-Leahy Scale: Poor Standing balance comment: using RW                             Pertinent Vitals/Pain Pain Assessment Pain Assessment: Faces Faces Pain Scale: Hurts whole lot Pain Location: right hip with movement and left lower leg due to compression stocking Pain Descriptors / Indicators: Shooting, Grimacing, Guarding, Moaning Pain Intervention(s): Limited activity within patient's tolerance, Monitored during session, Premedicated before session, Repositioned    Home Living Family/patient expects to be discharged to:: Private residence Living Arrangements: Children Available Help at Discharge: Family;Available 24 hours/day Type of Home: House Home Access: Ramped entrance       Home Layout: Two level;Able to live on main level with bedroom/bathroom;Full bath on main level Home Equipment: Rolling Walker (2 wheels);BSC/3in1;Cane - quad;Shower seat      Prior Function Prior Level of Function : Needs assist       Physical Assist : Mobility (physical);ADLs (physical) Mobility (physical): Bed mobility;Transfers;Gait;Stairs ADLs (physical): Grooming;Bathing;Dressing;Toileting;IADLs 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 RW ADLs Comments: assisted by family, rarely require help for sitting up at bedside     Hand Dominance   Dominant Hand: Right    Extremity/Trunk Assessment   Upper Extremity Assessment Upper Extremity Assessment: Generalized weakness    Lower Extremity Assessment Lower Extremity Assessment: Generalized weakness;RLE deficits/detail RLE Deficits / Details: grossly 3+/5 RLE: Unable to fully assess due to pain RLE Sensation: WNL RLE Coordination: WNL    Cervical / Trunk Assessment Cervical / Trunk Assessment: Normal  Communication   Communication: No difficulties  Cognition Arousal/Alertness: Awake/alert Behavior During  Therapy: Anxious Overall Cognitive Status: History of cognitive impairments - at baseline                                          General Comments      Exercises     Assessment/Plan    PT Assessment Patient needs continued PT services  PT Problem List Decreased strength;Decreased activity tolerance;Decreased balance;Decreased mobility       PT Treatment Interventions DME instruction;Stair training;Functional mobility training;Therapeutic activities;Therapeutic exercise;Gait training;Balance training;Patient/family education    PT Goals (Current goals can be found in the Care Plan section)  Acute Rehab PT Goals Patient Stated Goal: return home after rehab PT Goal Formulation: With patient/family Time For Goal Achievement: 05/31/22 Potential to Achieve Goals: Good    Frequency Min 3X/week     Co-evaluation               AM-PAC PT "6 Clicks" Mobility  Outcome Measure Help needed turning from your back to your side while in a flat bed without using bedrails?: A Lot Help needed moving from lying on your back to sitting on the side of a flat bed without using bedrails?: A Lot Help needed moving to and from a bed to a chair (including a wheelchair)?: A Lot Help needed standing up from a chair using your arms (e.g., wheelchair or bedside chair)?: A Lot Help needed to walk in hospital room?: A Lot Help needed climbing 3-5 steps with a railing? : Total 6 Click Score: 11    End  of Session   Activity Tolerance: Patient tolerated treatment well;Patient limited by fatigue;Patient limited by pain Patient left: in chair;with call bell/phone within reach;with family/visitor present Nurse Communication: Mobility status PT Visit Diagnosis: Unsteadiness on feet (R26.81);Other abnormalities of gait and mobility (R26.89);Muscle weakness (generalized) (M62.81)    Time: 8657-8469 PT Time Calculation (min) (ACUTE ONLY): 17 min   Charges:   PT Evaluation $PT Eval  Low Complexity: 1 Low PT Treatments $Therapeutic Activity: 8-22 mins        1:47 PM, 05/17/22 Ocie Bob, MPT Physical Therapist with [facility] [phone] office [phone] mobile phone

## 2022-05-17 NOTE — Plan of Care (Signed)
  Problem: Acute Rehab PT Goals(only PT should resolve) Goal: Pt Will Go Supine/Side To Sit Outcome: Progressing Flowsheets (Taken 05/17/2022 1348) Pt will go Supine/Side to Sit: with moderate assist Goal: Patient Will Transfer Sit To/From Stand Outcome: Progressing Flowsheets (Taken 05/17/2022 1348) Patient will transfer sit to/from stand:  with minimal assist  with moderate assist Goal: Pt Will Transfer Bed To Chair/Chair To Bed Outcome: Progressing Flowsheets (Taken 05/17/2022 1348) Pt will Transfer Bed to Chair/Chair to Bed:  with min assist  with mod assist Goal: Pt Will Ambulate Outcome: Progressing Flowsheets (Taken 05/17/2022 1348) Pt will Ambulate:  15 feet  with minimal assist  with moderate assist  with rolling walker   1:48 PM, 05/17/22 Ocie Bob, MPT Physical Therapist with [facility] [phone] office [phone] mobile phone

## 2022-05-18 LAB — CBC
HCT: 27.8 % — ABNORMAL LOW (ref 36.0–46.0)
Hemoglobin: 9.2 g/dL — ABNORMAL LOW (ref 12.0–15.0)
MCH: 29.8 pg (ref 26.0–34.0)
MCHC: 33.1 g/dL (ref 30.0–36.0)
MCV: 90 fL (ref 80.0–100.0)
Platelets: 158 10*3/uL (ref 150–400)
RBC: 3.09 MIL/uL — ABNORMAL LOW (ref 3.87–5.11)
RDW: 14.8 % (ref 11.5–15.5)
WBC: 9.5 10*3/uL (ref 4.0–10.5)
nRBC: 0 % (ref 0.0–0.2)

## 2022-05-18 LAB — BASIC METABOLIC PANEL
Anion gap: 6 (ref 5–15)
BUN: 19 mg/dL (ref 8–23)
CO2: 24 mmol/L (ref 22–32)
Calcium: 9.7 mg/dL (ref 8.9–10.3)
Chloride: 102 mmol/L (ref 98–111)
Creatinine, Ser: 0.71 mg/dL (ref 0.44–1.00)
GFR, Estimated: >60 mL/min (ref >60)
Glucose, Bld: 157 mg/dL — ABNORMAL HIGH (ref 70–99)
Potassium: 4.3 mmol/L (ref 3.5–5.1)
Sodium: 132 mmol/L — ABNORMAL LOW (ref 135–145)

## 2022-05-18 LAB — GLUCOSE, CAPILLARY
Glucose-Capillary: 133 mg/dL — ABNORMAL HIGH (ref 70–99)
Glucose-Capillary: 175 mg/dL — ABNORMAL HIGH (ref 70–99)
Glucose-Capillary: 169 mg/dL — ABNORMAL HIGH (ref 70–99)

## 2022-05-18 MED ORDER — ACETAMINOPHEN 500 MG PO TABS: 500.0000 mg | ORAL_TABLET | Freq: Four times a day (QID) | ORAL | Status: AC | PRN

## 2022-05-18 MED ORDER — POLYSACCHARIDE IRON COMPLEX 150 MG PO CAPS: 150.0000 mg | ORAL_CAPSULE | Freq: Every day | ORAL | 1 refills | Status: DC

## 2022-05-18 MED ORDER — OXYCODONE HCL 5 MG PO TABS: 5.0000 mg | ORAL_TABLET | Freq: Four times a day (QID) | ORAL | 0 refills | Status: DC | PRN

## 2022-05-18 MED ORDER — POLYETHYLENE GLYCOL 3350 17 G PO PACK: 17.0000 g | PACK | Freq: Every day | ORAL | 0 refills | Status: DC | PRN

## 2022-05-18 MED ORDER — METHOCARBAMOL 500 MG PO TABS: 500.0000 mg | ORAL_TABLET | Freq: Three times a day (TID) | ORAL | 0 refills | Status: DC | PRN

## 2022-05-18 NOTE — Care Management Important Message (Signed)
Important Message  Patient Details  Name: Brittany [name] MRN: [id number] Date of Birth: May 05, 1927   Medicare Important Message Given:  Yes (copy left in room for family)     Corey Harold 05/18/2022, 2:17 PM

## 2022-05-18 NOTE — Discharge Summary (Signed)
Physician Discharge Summary   Patient: Brittany [name] MRN: [id number] DOB: 05/31/1927  Admit date:     05/15/2022  Discharge date: 05/18/22  Discharge Physician: Vassie Loll   PCP: Tracey Harries, MD   Recommendations at discharge:  Repeat CBC in 5 days to follow hemoglobin trend/stability Repeat basic metabolic panel in 5 days to follow electrolytes and renal function. Outpatient follow-up with Dr. Romeo Apple (orthopedic service) in 4 weeks.  Discharge Diagnoses: Principal Problem:   Hip fracture (HCC) Active Problems:   Essential hypertension   LAD-balloon angioplasty/cutting balloon-2007   Pacemaker   Chronic diastolic CHF (congestive heart failure) (HCC)   DM2 (diabetes mellitus, type 2) (HCC)   Atrial flutter (HCC) Postoperative anemia.  Brief Narrative:   89 y.o. female with past medical history relevant for CAD, AF paroxysmal atrial flutter/A-fib on chronic anticoagulation with Eliquis, history of complete heart block with prior [facility] Jude pacemaker placement, HTN, diastolic dysfunction CHF, DM2 admitted on 05/15/2022 fter mechanical fall with right hip fracture  Assessment and Plan: 1)Rt Hip Fx--- Status post fall with right hip fracture --CT C-spine and CT head without acute findings -s/p  ORIF by Dr. Romeo Apple on 05/16/2022 -IV and oral pain medications as ordered =-Weightbearing as tolerated -Okay to restart PTA Eliquis which will provide DVT prophylaxis. -Physical therapy recommending skilled nursing facility for rehabilitation.   2)Leukocytosis--- .Marland Kitchen Suspect this is reactive after fall and acute hip fracture. -No signs of acute infection appreciated. -Chest ray without infectious findings -Continue holding antibiotic therapy. -Repeat CBC at follow-up visit to assess trend/stability.   3) social/ethics--- plan of care and advanced directive discussed with HCPOA/ patient's daughter Ms. [name] at bedside -Despite previous DNR status - she would like to leave  patient full code at this time. -Further goals of care/advance care planning discussion in the outpatient setting recommended.   4)PAF/paroxysmal atrial flutter/H/o CHB--s/p [facility] Jude pacemaker placement previously. -Continue the use of Eliquis for secondary prevention. -Rate controlled without the use of negative chronotropic agents. -Continue outpatient follow-up with cardiology service.   5)DM2-last A1c 5.9 reflecting excellent diabetic control PTA -Continue to follow CBGs and further adjust hypoglycemic regimen as needed -At discharge resume the use of metformin as previously prescribed.   6)HTN/CAD--prior balloon angioplasty LAD in 2007 -c/n isosorbide and Avapro -Vital signs stable; patient denies chest pain. -Continue patient follow-up with cardiology service as previously instructed.   7)Dementia--- stable, patient with significant cognitive and memory deficits, supportive care advised.   8)HFpEF/chronic diastolic dysfunction CHF--- echo from 03/20/2022 with EF of 60 to 65%, no aortic stenosis -Patient had mild mitral stenosis -Continue Lasix 40 mg daily -Continue patient follow-up with cardiology service -Continue to follow low sodium/heart healthy diet.   9) acute postoperative anemia -Hemoglobin down to 9.5>>9.2 -No blood transfusion needed. -Started on daily Niferex -Continue to follow hemoglobin trend.  Consultants: Orthopedic service Procedures performed: Status post ORIF (05/16/2022); review x-ray reports below. Disposition: Skilled nursing facility Diet recommendation: Heart healthy/low-sodium diet.  DISCHARGE MEDICATION: Allergies as of 05/18/2022       Reactions   Ciprofloxacin Other (See Comments)   Severe chest pains   Ace Inhibitors Other (See Comments)   REACTION: cough and sore throat   Cedax [ceftibuten] Other (See Comments)   Possibly nausea/vomiting, cyanosis, severe headaches   Lipitor [atorvastatin] Other (See Comments)   REACTION: Myalgia    Plavix [clopidogrel Bisulfate] Other (See Comments)   Possibly nausea/vomiting, cyanosis, severe headaches    Statins Other (See Comments)   Myalgias,  cyanosis        Medication List     STOP taking these medications    liver oil-zinc oxide 40 % ointment Commonly known as: DESITIN       TAKE these medications    acetaminophen 500 MG tablet Commonly known as: TYLENOL Take 1 tablet (500 mg total) by mouth every 6 (six) hours as needed for mild pain, headache, fever or moderate pain.   cyanocobalamin 1000 MCG tablet Take 1,000 mcg by mouth daily.   Eliquis 5 MG Tabs tablet Generic drug: apixaban TAKE 1 TABLET TWICE A DAY   famotidine 20 MG tablet Commonly known as: PEPCID Take 1 tablet (20 mg total) by mouth daily.   furosemide 40 MG tablet Commonly known as: LASIX Take 1 tablet (40 mg total) by mouth daily.   irbesartan 75 MG tablet Commonly known as: AVAPRO Take 0.5 tablets (37.5 mg total) by mouth daily.   iron polysaccharides 150 MG capsule Commonly known as: NIFEREX Take 1 capsule (150 mg total) by mouth daily. Start taking on: May 19, 2022   isosorbide mononitrate 30 MG 24 hr tablet Commonly known as: IMDUR Take 1 tablet by mouth daily.   metFORMIN 500 MG 24 hr tablet Commonly known as: GLUCOPHAGE-XR Take 500 mg by mouth daily with breakfast.   methocarbamol 500 MG tablet Commonly known as: ROBAXIN Take 1 tablet (500 mg total) by mouth every 8 (eight) hours as needed for muscle spasms.   Multivitamin Women 50+ Tabs Take 1 tablet by mouth daily.   ondansetron 8 MG tablet Commonly known as: ZOFRAN Take by mouth.   oxyCODONE 5 MG immediate release tablet Commonly known as: Oxy IR/ROXICODONE Take 1 tablet (5 mg total) by mouth every 6 (six) hours as needed for severe pain.   polyethylene glycol 17 g packet Commonly known as: MIRALAX / GLYCOLAX Take 17 g by mouth daily as needed for mild constipation.   potassium chloride SA 20 MEQ  tablet Commonly known as: KLOR-CON M Take 20 mEq by mouth daily.   senna-docusate 8.6-50 MG tablet Commonly known as: Senokot-S Take 2 tablets by mouth 2 (two) times daily.        Contact information for follow-up providers     Vickki Hearing, MD. Schedule an appointment as soon as possible for a visit in 4 week(s).   Specialties: Orthopedic Surgery, Radiology Contact information: [address] [place] [address] [phone]              Contact information for after-discharge care     Destination     [facility] .   Service: Skilled Nursing Contact information: [address] [place] [address] [phone]                    Discharge Exam: Ceasar Mons Weights   05/15/22 5956 05/16/22 0812  Weight: 64 kg 64 kg   General exam: Alert, awake and adequately following commands.  Appears pleasantly confused (baseline from her dementia).  No fever. Respiratory system: No using accessory muscles; respiratory effort normal.  Good saturation appreciated. Cardiovascular system: Rate controlled; no rubs, no gallops, no JVD. Gastrointestinal system: Abdomen is nondistended, soft and nontender. No organomegaly or masses felt. Normal bowel sounds heard. Central nervous system: Alert and oriented. No focal neurological deficits. Extremities: No cyanosis, clubbing or edema. Skin: No petechiae.  Right lateral dressings in place, clean and dry. Psychiatry: Judgement and insight appear impaired secondary to underlying dementia.  Condition  at discharge: Stable and improved.  The results of significant diagnostics from this hospitalization (including imaging, microbiology, ancillary and laboratory) are listed below for reference.   Imaging Studies: DG HIP UNILAT WITH PELVIS 2-3 VIEWS RIGHT  Result Date: 05/16/2022 CLINICAL DATA:  Postop right hip fracture fixation. EXAM: DG HIP (WITH OR WITHOUT PELVIS) 2-3V RIGHT COMPARISON:   Preoperative imaging. FINDINGS: Femoral intramedullary nail with trans trochanteric and distal locking screw fixation traverse proximal femur fracture. There is improved fracture alignment from preoperative imaging. Recent postsurgical change includes air and edema in the soft tissues. Previous left femoral fixation hardware. IMPRESSION: ORIF of proximal femur fracture with improved alignment from preoperative imaging. Electronically Signed   By: Narda Rutherford M.D.   On: 05/16/2022 12:31   DG HIP UNILAT WITH PELVIS 2-3 VIEWS RIGHT  Result Date: 05/16/2022 CLINICAL DATA:  Right hip fracture. EXAM: DG HIP (WITH OR WITHOUT PELVIS) 2-3V RIGHT COMPARISON:  Preoperative imaging FINDINGS: Nine fluoroscopic spot images obtained in the operating room the pelvis and right hip. Sequential images during intramedullary nail with trans trochanteric and distal locking screw fixation of proximal femur fracture. Fluoroscopy time 1.3 minutes. Dose 25.38 mGy IMPRESSION: Procedural fluoroscopy during right hip fracture ORIF. Electronically Signed   By: Narda Rutherford M.D.   On: 05/16/2022 12:04   DG C-Arm 1-60 Min-No Report  Result Date: 05/16/2022 Fluoroscopy was utilized by the requesting physician.  No radiographic interpretation.   DG Hip Unilat With Pelvis 2-3 Views Right  Result Date: 05/15/2022 CLINICAL DATA:  89 year old female status post unwitnessed fall. EXAM: DG HIP (WITH OR WITHOUT PELVIS) 2-3V RIGHT COMPARISON:  Pelvis CT 03/19/2022. FINDINGS: Chronic left femur ORIF with mostly visible intramedullary rod, proximal interlocking dynamic hip screw and distal interlocking cortical screw. Grossly intact proximal left femur, hardware. Pelvis osteopenia. No acute pelvis fracture identified. Acute comminuted and impacted right femur intertrochanteric fracture. Varus impaction, right femoral head appears to remain normally located within the acetabulum. Calcified femoral artery atherosclerosis. Negative visible  bowel gas. IMPRESSION: 1. Acute comminuted Right femur intertrochanteric fracture with varus impaction. 2. Chronic left femur ORIF. No acute pelvis fracture identified. Electronically Signed   By: Odessa Fleming M.D.   On: 05/15/2022 07:38   DG Chest 1 View  Result Date: 05/15/2022 CLINICAL DATA:  89 year old female status post unwitnessed fall. EXAM: CHEST  1 VIEW COMPARISON:  Cervical spine CT today.  Portable chest 02/28/2022. FINDINGS: Portable AP supine view at 0657 hours. Larger lung volumes. Stable left chest dual lead cardiac pacemaker. Stable cardiomegaly and mediastinal contours. Visualized tracheal air column is within normal limits. Allowing for portable technique the lungs are clear; calcified left lower lung granuloma. Osteopenia. No acute osseous abnormality identified. Paucity of bowel gas in the upper abdomen. IMPRESSION: 1. No acute cardiopulmonary abnormality or acute traumatic injury identified. 2. Stable cardiomegaly.  Left chest pacemaker. Electronically Signed   By: Odessa Fleming M.D.   On: 05/15/2022 07:37   CT CERVICAL SPINE WO CONTRAST  Result Date: 05/15/2022 CLINICAL DATA:  89 year old female status post unwitnessed fall. EXAM: CT CERVICAL SPINE WITHOUT CONTRAST TECHNIQUE: Multidetector CT imaging of the cervical spine was performed without intravenous contrast. Multiplanar CT image reconstructions were also generated. RADIATION DOSE REDUCTION: This exam was performed according to the departmental dose-optimization program which includes automated exposure control, adjustment of the mA and/or kV according to patient size and/or use of iterative reconstruction technique. COMPARISON:  Head CT today. FINDINGS: Alignment: Cervicothoracic junction alignment is within normal limits. Straightening of  cervical lordosis above C7. Bilateral posterior element alignment is within normal limits. Skull base and vertebrae: Osteopenia. Mild skull base motion artifact. Visualized skull base is intact. No  atlanto-occipital dissociation. C1 and C2 appear intact and aligned. No acute osseous abnormality identified. Soft tissues and spinal canal: No prevertebral fluid or swelling. No visible canal hematoma. Partially retropharyngeal carotid arteries with mild for age calcified atherosclerosis. Disc levels: C5-C6 acquired appearing interbody ankylosis. Mild for age cervical spine degeneration elsewhere, and capacious appearance of the cervical spinal canal. Upper chest: Osteopenia. Grossly intact visible upper thoracic levels. Left chest cardiac pacemaker type device. Lungs appear clear with mild respiratory motion. Calcified aortic atherosclerosis. IMPRESSION: 1. No acute traumatic injury identified in the cervical spine. 2. Mild for age cervical spine degeneration. Acquired C5-C6 ankylosis. 3.  Aortic Atherosclerosis (ICD10-I70.0). Electronically Signed   By: Odessa Fleming M.D.   On: 05/15/2022 07:36   CT HEAD WO CONTRAST  Result Date: 05/15/2022 CLINICAL DATA:  89 year old female status post unwitnessed fall. Dementia. EXAM: CT HEAD WITHOUT CONTRAST TECHNIQUE: Contiguous axial images were obtained from the base of the skull through the vertex without intravenous contrast. RADIATION DOSE REDUCTION: This exam was performed according to the departmental dose-optimization program which includes automated exposure control, adjustment of the mA and/or kV according to patient size and/or use of iterative reconstruction technique. COMPARISON:  Head CT 02/28/2022. FINDINGS: Brain: Stable cerebral volume, chronic temporal lobe atrophy. No midline shift, ventriculomegaly, mass effect, evidence of mass lesion, intracranial hemorrhage or evidence of cortically based acute infarction. Gray-white differentiation is stable and within normal limits for age. Vascular: Calcified atherosclerosis at the skull base. No suspicious intracranial vascular hyperdensity. Skull: Hyperostosis of the calvarium. No acute osseous abnormality identified.  Sinuses/Orbits: Visualized paranasal sinuses and mastoids are stable and well aerated. Other: Orbit and scalp soft tissues appears stable. IMPRESSION: 1. No acute intracranial abnormality or acute traumatic injury identified. 2. Chronic temporal lobe atrophy.  Cerebral Atrophy (ICD10-G31.9). Electronically Signed   By: Odessa Fleming M.D.   On: 05/15/2022 07:16   CUP PACEART REMOTE DEVICE CHECK  Result Date: 05/04/2022 Monthly battery check.  Estimated 47mo Normal device function. No new alerts. Follow up as scheduled monthly LA, CVRS   Microbiology: Results for orders placed or performed during the hospital encounter of 03/19/22  Surgical pcr screen     Status: None   Collection Time: 03/21/22  7:00 PM   Specimen: Nasal Mucosa; Nasal Swab  Result Value Ref Range Status   MRSA, PCR NEGATIVE NEGATIVE Final   Staphylococcus aureus NEGATIVE NEGATIVE Final    Comment: (NOTE) The Xpert SA Assay (FDA approved for NASAL specimens in patients 110 years of age and older), is one component of a comprehensive surveillance program. It is not intended to diagnose infection nor to guide or monitor treatment. Performed at [facility], [address], [place], [place] [address]     Labs: CBC: Recent Labs  Lab 05/15/22 [phone] 05/16/22 0537 05/17/22 0401 05/18/22 0414  WBC 12.4* 12.0* 10.1 9.5  NEUTROABS 9.4*  --   --   --   HGB 12.2 11.6* 9.5* 9.2*  HCT 37.2 35.3* 29.6* 27.8*  MCV 91.0 90.5 92.5 90.0  PLT 237 215 141* 158   Basic Metabolic Panel: Recent Labs  Lab 05/15/22 0637 05/16/22 0537 05/17/22 0401 05/18/22 0414  NA 140 139 136 132*  K 3.6 4.1 4.0 4.3  CL 103 106 105 102  CO2 27 26 25 24   GLUCOSE 198* 143* 114*  157*  BUN 13 13 15 19   CREATININE 0.70 0.76 0.67 0.71  CALCIUM 10.6* 10.1 9.2 9.7   CBG: Recent Labs  Lab 05/17/22 1130 05/17/22 1557 05/17/22 2108 05/18/22 0717 05/18/22 1120  GLUCAP 164* 161* 194* 133* 175*    Discharge time spent: greater than 30  minutes.  Signed: Vassie Loll, MD [facility] 05/18/2022

## 2022-05-18 NOTE — Progress Notes (Signed)
Physical Therapy Treatment Patient Details Name: Brittany [name] MRN: [id number] DOB: [birth year] Today's Date: 05/18/2022   History of Present Illness Brittany [name]  is a 89 y.o. female s/p ORIF right hip fracture on 05/16/22 with past medical history relevant for CAD, AF paroxysmal atrial flutter/A-fib on chronic anticoagulation with Eliquis, history of complete heart block with prior [name] Jude pacemaker placement, HTN, diastolic dysfunction CHF, DM2 presents to the ED by EMS with concerns for unwitnessed fall and right right hip pain and right lower extremity deformity----  -patient has dementia and is a poor historian  -History obtained from patient's daughter Ms. Brittany [name] at bedside    PT Comments    Patient required Mod/max assist for sitting up at bedside and scooting forward with slightly less right hip pain, apprehensive for standing, but compliant after encouragement, verbal/tactile cueing and able to take a few slow labored side steps to transfer to chair.  Patient's daughter requested and issued red theraband for use in HEP with good return demonstrated and understanding acknowledged.  Patient tolerated sitting up in chair with daughter present after therapy.  Patient will benefit from continued skilled physical therapy in hospital and recommended venue below to increase strength, balance, endurance for safe ADLs and gait.    Recommendations for follow up therapy are one component of a multi-disciplinary discharge planning process, led by the attending physician.  Recommendations may be updated based on patient status, additional functional criteria and insurance authorization.  Follow Up Recommendations  Can patient physically be transported by private vehicle: No    Assistance Recommended at Discharge Intermittent Supervision/Assistance  Patient can return home with the following A lot of help with bathing/dressing/bathroom;A lot of help with walking and/or transfers;Help with  stairs or ramp for entrance;Assistance with cooking/housework   Equipment Recommendations  None recommended by PT    Recommendations for Other Services       Precautions / Restrictions Precautions Precautions: Fall Restrictions Weight Bearing Restrictions: Yes RLE Weight Bearing: Weight bearing as tolerated     Mobility  Bed Mobility Overal bed mobility: Needs Assistance Bed Mobility: Sit to Supine     Supine to sit: Mod assist, Max assist     General bed mobility comments: increased time, labored movement  with slightly less pain right hip    Transfers Overall transfer level: Needs assistance Equipment used: Rolling walker (2 wheels) Transfers: Sit to/from Stand, Bed to chair/wheelchair/BSC Sit to Stand: Mod assist, Max assist   Step pivot transfers: Mod assist, Max assist       General transfer comment: decreased balance due to limited weightbearing on RLE    Ambulation/Gait Ambulation/Gait assistance: Max assist Gait Distance (Feet): 4 Feet Assistive device: Rolling walker (2 wheels) Gait Pattern/deviations: Decreased step length - right, Decreased step length - left, Decreased stride length, Decreased stance time - right, Antalgic, Shuffle, Knees buckling Gait velocity: slow     General Gait Details: limited to a few side steps before having to sit due to poor standing balance, increasing right hip pain   Stairs             Wheelchair Mobility    Modified Rankin (Stroke Patients Only)       Balance Overall balance assessment: Needs assistance Sitting-balance support: Feet supported, No upper extremity supported Sitting balance-Leahy Scale: Fair Sitting balance - Comments: fair/good seated at EOB   Standing balance support: During functional activity, Reliant on assistive device for balance, Bilateral upper extremity supported Standing balance-Leahy Scale: Poor  Cognition Arousal/Alertness:  Awake/alert Behavior During Therapy: Anxious Overall Cognitive Status: History of cognitive impairments - at baseline                                          Exercises General Exercises - Lower Extremity Long Arc Quad: Seated, AROM, AAROM, Both, 10 reps Hip Flexion/Marching: Seated, AROM, AAROM, Both, 10 reps    General Comments        Pertinent Vitals/Pain Pain Assessment Pain Assessment: Faces Faces Pain Scale: Hurts even more Pain Location: Right hip with movement Pain Descriptors / Indicators: Guarding, Grimacing, Sore Pain Intervention(s): Limited activity within patient's tolerance, Premedicated before session, Repositioned, Monitored during session    Home Living                          Prior Function            PT Goals (current goals can now be found in the care plan section) Acute Rehab PT Goals Patient Stated Goal: return home after rehab PT Goal Formulation: With patient/family Time For Goal Achievement: 05/31/22 Potential to Achieve Goals: Good Progress towards PT goals: Progressing toward goals    Frequency    Min 3X/week      PT Plan Current plan remains appropriate    Co-evaluation              AM-PAC PT "6 Clicks" Mobility   Outcome Measure  Help needed turning from your back to your side while in a flat bed without using bedrails?: A Lot Help needed moving from lying on your back to sitting on the side of a flat bed without using bedrails?: A Lot Help needed moving to and from a bed to a chair (including a wheelchair)?: A Lot Help needed standing up from a chair using your arms (e.g., wheelchair or bedside chair)?: A Lot Help needed to walk in hospital room?: A Lot Help needed climbing 3-5 steps with a railing? : Total 6 Click Score: 11    End of Session   Activity Tolerance: Patient tolerated treatment well;Patient limited by fatigue Patient left: in chair;with call bell/phone within reach;with  family/visitor present Nurse Communication: Mobility status PT Visit Diagnosis: Unsteadiness on feet (R26.81);Other abnormalities of gait and mobility (R26.89);Muscle weakness (generalized) (M62.81)     Time: 0933-1000 PT Time Calculation (min) (ACUTE ONLY): 27 min  Charges:  $Therapeutic Exercise: 8-22 mins $Therapeutic Activity: 8-22 mins                     2:11 PM, 05/18/22 Ocie Bob, MPT Physical Therapist with [facility] 336 [phone] office [phone] mobile phone

## 2022-05-18 NOTE — Progress Notes (Signed)
Sat in chair for several hours.  Assisted to bed with max assist.  Foley out this morning and has not voided.  Scan just showed 167,

## 2022-05-18 NOTE — TOC Transition Note (Signed)
Transition of Care [facility]) - CM/SW Discharge Note   Patient Details  Name: Brittany [name] MRN: [id number] Date of Birth: [birth year]  [facility] CM/SW Contact:  Annice Needy, LCSW Phone Number: 05/18/2022, 4:55 PM   Clinical Narrative:    D/c clinicals sent to facility. Nurse to call report. EMS arranged for transport. TOC signing off.   Final next level of care: Skilled Nursing Facility Barriers to Discharge: No Barriers Identified   Patient Goals and CMS Choice CMS Medicare.gov Compare Post Acute Care list provided to:: Patient Represenative (must comment) Choice offered to / list presented to : Adult Children  Discharge Placement                Patient chooses bed at: [facility] Patient to be transferred to facility by: [facility] Name of family member notified: daughter Patient and family notified of of transfer: 05/18/22  Discharge Plan and Services Additional resources added to the After Visit Summary for   In-house Referral: Clinical Social Work                                   Social Determinants of Health (SDOH) Interventions SDOH Screenings   Food Insecurity: No Food Insecurity (05/15/2022)  Housing: Low Risk  (05/15/2022)  Transportation Needs: No Transportation Needs (05/15/2022)  Utilities: Not At Risk (05/15/2022)  Tobacco Use: Medium Risk (05/16/2022)     Readmission Risk Interventions    03/02/2022   11:30 AM  Readmission Risk Prevention Plan  Post Dischage Appt Not Complete  Medication Screening Complete  Transportation Screening Complete

## 2022-05-18 NOTE — Progress Notes (Signed)
Report called to Roxanne at [facility] and EMS transporting. Transported by EMS just now.  IV removed.  Scripts in packet

## 2022-05-22 ENCOUNTER — Encounter (HOSPITAL_COMMUNITY): Payer: Self-pay | Admitting: Orthopedic Surgery

## 2022-05-30 NOTE — Progress Notes (Signed)
Remote pacemaker transmission.   

## 2022-06-06 ENCOUNTER — Ambulatory Visit (INDEPENDENT_AMBULATORY_CARE_PROVIDER_SITE_OTHER): Payer: Medicare Other

## 2022-06-06 DIAGNOSIS — I442 Atrioventricular block, complete: Secondary | ICD-10-CM

## 2022-06-12 ENCOUNTER — Ambulatory Visit (INDEPENDENT_AMBULATORY_CARE_PROVIDER_SITE_OTHER): Payer: Medicare Other | Admitting: Orthopedic Surgery

## 2022-06-12 ENCOUNTER — Other Ambulatory Visit (INDEPENDENT_AMBULATORY_CARE_PROVIDER_SITE_OTHER): Payer: Medicare Other

## 2022-06-12 ENCOUNTER — Encounter: Payer: Self-pay | Admitting: Orthopedic Surgery

## 2022-06-12 VITALS — BP 120/76 | Ht 62.0 in | Wt 141.0 lb

## 2022-06-12 DIAGNOSIS — S72001D Fracture of unspecified part of neck of right femur, subsequent encounter for closed fracture with routine healing: Secondary | ICD-10-CM | POA: Diagnosis not present

## 2022-06-12 DIAGNOSIS — T8149XA Infection following a procedure, other surgical site, initial encounter: Secondary | ICD-10-CM

## 2022-06-12 LAB — CUP PACEART REMOTE DEVICE CHECK
Battery Remaining Longevity: 4 mo
Battery Remaining Percentage: 6 %
Battery Voltage: 2.77 V
Brady Statistic RV Percent Paced: 99 %
Date Time Interrogation Session: 20240528115027
Implantable Lead Connection Status: 753985
Implantable Lead Connection Status: 753985
Implantable Lead Implant Date: 20061229
Implantable Lead Implant Date: 20061229
Implantable Lead Location: 753859
Implantable Lead Location: 753860
Implantable Lead Model: 5076
Implantable Pulse Generator Implant Date: 20170329
Lead Channel Impedance Value: 700 Ohm
Lead Channel Pacing Threshold Amplitude: 1.75 V
Lead Channel Pacing Threshold Pulse Width: 1 ms
Lead Channel Sensing Intrinsic Amplitude: 12 mV
Lead Channel Setting Pacing Amplitude: 4 V
Lead Channel Setting Pacing Pulse Width: 1 ms
Lead Channel Setting Sensing Sensitivity: 6 mV
Pulse Gen Model: 2240
Pulse Gen Serial Number: 7892876

## 2022-06-12 MED ORDER — DOXYCYCLINE HYCLATE 100 MG PO TABS
100.0000 mg | ORAL_TABLET | Freq: Two times a day (BID) | ORAL | 1 refills | Status: DC
Start: 2022-06-12 — End: 2022-12-13

## 2022-06-12 NOTE — Progress Notes (Signed)
POV # 1   Chief Complaint  Patient presents with   Post-op Problem    Has drainage (weeping) from incision site 05/16/22 IM nail      Preop diagnosis right hip fracture intertrochanteric  Postoperative diagnosis same Procedure open treatment internal fixation of the  right  hip Surgeon Romeo Apple Assistants none Anesthesia general  Surgical findings 3 PART  INTERTROCHANTERIC HIP FRACTURE   SHORT IM NAIL   POD 27  The patient is struggling mainly because of dementia  She is diabetic  She has a postop wound cellulitis at the top incision the other 2 incisions look fine her leg alignment looks good her x-ray looks great  Start doxycycline 100 mg twice a day for 28 days  Follow-up in 4 weeks for repeat x-ray

## 2022-07-03 NOTE — Addendum Note (Signed)
Addended by: Geralyn Flash D on: 07/03/2022 03:30 PM   Modules accepted: Level of Service

## 2022-07-03 NOTE — Progress Notes (Signed)
Remote pacemaker transmission.   

## 2022-07-06 ENCOUNTER — Ambulatory Visit (INDEPENDENT_AMBULATORY_CARE_PROVIDER_SITE_OTHER): Payer: Medicare Other

## 2022-07-06 DIAGNOSIS — I442 Atrioventricular block, complete: Secondary | ICD-10-CM

## 2022-07-07 LAB — CUP PACEART REMOTE DEVICE CHECK
Battery Remaining Longevity: 3 mo
Battery Remaining Percentage: 4 %
Battery Voltage: 2.74 V
Brady Statistic RV Percent Paced: 99 %
Date Time Interrogation Session: 20240628020020
Implantable Lead Connection Status: 753985
Implantable Lead Connection Status: 753985
Implantable Lead Implant Date: 20061229
Implantable Lead Implant Date: 20061229
Implantable Lead Location: 753859
Implantable Lead Location: 753860
Implantable Lead Model: 5076
Implantable Pulse Generator Implant Date: 20170329
Lead Channel Impedance Value: 690 Ohm
Lead Channel Pacing Threshold Amplitude: 1.75 V
Lead Channel Pacing Threshold Pulse Width: 1 ms
Lead Channel Sensing Intrinsic Amplitude: 12 mV
Lead Channel Setting Pacing Amplitude: 4 V
Lead Channel Setting Pacing Pulse Width: 1 ms
Lead Channel Setting Sensing Sensitivity: 6 mV
Pulse Gen Model: 2240
Pulse Gen Serial Number: 7892876

## 2022-07-10 ENCOUNTER — Ambulatory Visit (INDEPENDENT_AMBULATORY_CARE_PROVIDER_SITE_OTHER): Payer: Medicare Other | Admitting: Orthopedic Surgery

## 2022-07-10 ENCOUNTER — Encounter: Payer: Self-pay | Admitting: Orthopedic Surgery

## 2022-07-10 ENCOUNTER — Other Ambulatory Visit (INDEPENDENT_AMBULATORY_CARE_PROVIDER_SITE_OTHER): Payer: Medicare Other

## 2022-07-10 VITALS — BP 115/66 | HR 78

## 2022-07-10 DIAGNOSIS — S72001D Fracture of unspecified part of neck of right femur, subsequent encounter for closed fracture with routine healing: Secondary | ICD-10-CM | POA: Diagnosis not present

## 2022-07-10 NOTE — Progress Notes (Signed)
Chief Complaint  Patient presents with   Hip Pain    FU right hip fracture     Date of surgery May 16, 2022  Diagnosis right intertrochanteric hip fracture  Procedure open treatment internal fixation with short cephalic medullary nailing  The patient's daughter has movements of does well with  Limb alignment X-rays  Recommend x-ray before doxycycline for scant drainage which looks to be superficial

## 2022-07-20 NOTE — Progress Notes (Signed)
Remote pacemaker transmission.   

## 2022-07-26 ENCOUNTER — Ambulatory Visit: Payer: Medicare Other | Attending: Adult Health Nurse Practitioner

## 2022-07-26 ENCOUNTER — Other Ambulatory Visit: Payer: Self-pay

## 2022-07-26 DIAGNOSIS — R2681 Unsteadiness on feet: Secondary | ICD-10-CM | POA: Insufficient documentation

## 2022-07-26 DIAGNOSIS — M6281 Muscle weakness (generalized): Secondary | ICD-10-CM | POA: Insufficient documentation

## 2022-07-26 NOTE — Therapy (Signed)
OUTPATIENT PHYSICAL THERAPY LOWER EXTREMITY EVALUATION   Patient Name: Brittany [name] MRN: [id number] DOB:05-10-1927, 89 y.o., female Today's Date: 07/26/2022  END OF SESSION:  PT End of Session - 07/26/22 1035     Visit Number 1    Number of Visits 8    Date for PT Re-Evaluation 10/06/22    PT Start Time 1028    PT Stop Time 1108    PT Time Calculation (min) 40 min    Activity Tolerance Patient tolerated treatment well    Behavior During Therapy WFL for tasks assessed/performed             Past Medical History:  Diagnosis Date   Atrial flutter (HCC)    CAD (coronary artery disease)    CHF (congestive heart failure) (HCC)    Complete heart block (HCC)    a. s/p STJ dual chamber PPM   Depression    Glaucoma    Hyperlipidemia    Hypertension    Pacemaker    Type II diabetes mellitus (HCC)    Past Surgical History:  Procedure Laterality Date   BREAST BIOPSY  1991   ELECTROPHYSIOLOGIC STUDY N/A 02/12/2015   cardioversion by Dr Graciela Husbands    EP IMPLANTABLE DEVICE N/A 04/07/2015   a. STJ dual chamber PPM implanted 2006 for complete heart block b. gen change 2017   FEMUR IM NAIL  01/18/2012   Procedure: INTRAMEDULLARY (IM) NAIL FEMORAL;  Surgeon: Senaida Lange, MD;  Location: [facility];  Service: Orthopedics;  Laterality: Left;   INGUINAL HERNIA REPAIR Right 03/22/2022   Procedure: HERNIA REPAIR INGUINAL ADULT;  Surgeon: Lucretia Roers, MD;  Location: [facility];  Service: General;  Laterality: Right;   INTRAMEDULLARY (IM) NAIL INTERTROCHANTERIC Right 05/16/2022   Procedure: INTRAMEDULLARY (IM) NAIL INTERTROCHANTERIC;  Surgeon: Vickki Hearing, MD;  Location: [facility];  Service: Orthopedics;  Laterality: Right;  on eliquis needs general   left total knee replacement Bilateral 1996   TONSILLECTOMY  1937   Patient Active Problem List   Diagnosis Date Noted   Hip fracture (HCC) 05/15/2022   Pancreatic lesion 04/24/2022   Regurgitation of food 04/24/2022   Unsteadiness  04/21/2022   Preop cardiovascular exam 03/21/2022   Incarcerated right inguinal hernia 03/19/2022   Lactic acidosis 03/19/2022   LBBB (left bundle branch block) 03/19/2022   Type 2 diabetes mellitus with hyperglycemia (HCC) 03/19/2022   Constipation 03/19/2022   Obesity (BMI 30-39.9) 03/19/2022   Prolonged QT interval 03/19/2022   Acute metabolic encephalopathy 02/28/2022   Hypokalemia 02/28/2022   Leg swelling 06/22/2020   Atrial flutter (HCC) 05/06/2020   CHF (congestive heart failure) (HCC) 01/02/2015   Chronic diastolic CHF (congestive heart failure) (HCC) 01/02/2015   DM2 (diabetes mellitus, type 2) (HCC) 01/02/2015   Vitamin D deficiency 07/21/2013   Belching 10/22/2012   SOB (shortness of breath) 10/22/2012   Lower extremity numbness 06/20/2012   Fracture of right hip, closed, with routine healing, subsequent encounter 01/18/2012   HYPERSOMNIA UNSPECIFIED 06/23/2008   EDEMA 06/23/2008   GLAUCOMA 05/27/2008   LAD-balloon angioplasty/cutting balloon-2007 05/27/2008   AV BLOCK, COMPLETE 05/27/2008   Pacemaker 05/27/2008   Glaucoma 05/27/2008   HYPERLIPIDEMIA 11/05/2006   Essential hypertension 11/05/2006   REFERRING PROVIDER: Rebecka Apley, NP   REFERRING DIAG: Fracture of unspecified part of neck of left femur, sequela; Unsteadiness on feet   THERAPY DIAG:  Muscle weakness (generalized)  Unsteadiness on feet  Rationale for Evaluation and Treatment: Rehabilitation  ONSET DATE: 05/15/22  SUBJECTIVE:   SUBJECTIVE STATEMENT: Patient reports that she fell and broke her right hip. Her daughter notes that she has had a rough time since surgery with pain and last night was the first time her bandage was changed without any discharge.   PERTINENT HISTORY: HTN, CHF, DM, glaucoma, depression, hernia, pacemaker, and cognitive deficits  PAIN:  Are you having pain? No  PRECAUTIONS: Fall  RED FLAGS: None   WEIGHT BEARING RESTRICTIONS: No  FALLS:  Has patient  fallen in last 6 months? Yes. Number of falls 2  LIVING ENVIRONMENT: Lives with: lives with their family Lives in: House/apartment Stairs: Yes: External: 2 steps; none Has following equipment at home: Environmental consultant - 2 wheeled and Wheelchair (manual)  OCCUPATION: retired  PLOF: Independent  PATIENT GOALS: be able to walk better, be able to get into and out of her bathroom independently  NEXT MD VISIT: 10/18/22  OBJECTIVE:   DIAGNOSTIC FINDINGS: 07/10/22 right hip x-ray  Impression healing fracture intertrochanteric region right hip with intact hardware without complication   COGNITION: Overall cognitive status: Impaired     SENSATION: WFL  LOWER EXTREMITY ROM: unable to assess due to inability to achieve proper testing position  LOWER EXTREMITY MMT:  MMT Right eval Left eval  Hip flexion 3/5 3/5  Hip extension    Hip abduction 4-/5; assessed in sitting 4/5; assessed in sitting  Hip adduction 4/5 4/5  Hip internal rotation    Hip external rotation    Knee flexion    Knee extension 3/5 4-/5  Ankle dorsiflexion 3/5 3/5  Ankle plantarflexion    Ankle inversion    Ankle eversion     (Blank rows = not tested)  FUNCTIONAL TESTS:  Sit to stand transfer: minA for safety with minimal verbal cueing Stand to sit transfer: SBA for safety with verbal cueing to back up to her seat prior to sitting   GAIT: Assistive device utilized: Environmental consultant - 2 wheeled and Wheelchair (manual) Level of assistance: SBA Comments: Dan Humphreys was utilized to chair to table transfer: she exhibited scissoring pattern as she repeatedly stepped on right foot with her left lower extremity Wheelchair was utilized for longer distances   TODAY'S TREATMENT:                                                                                                                              DATE:                                     07/26/22 EXERCISE LOG  Exercise Repetitions and Resistance Comments  LAQ 10 reps each     Seated marching  10 reps each    Seated heel/toe raises  10 reps each    Seated toe taps 8 reps each    Seated hip abduction  10 reps each     Blank cell = exercise not  performed today   PATIENT EDUCATION:  Education details: POC, healing, prognosis, safety, HEP, and goals for therapy Person educated: Patient and Child(ren) Education method: Explanation Education comprehension: verbalized understanding  HOME EXERCISE PROGRAM: Today's interventions were added to her HEP. Her daughter reported understanding  ASSESSMENT:  CLINICAL IMPRESSION: Patient is a 89 y.o. female who was seen today for physical therapy evaluation and treatment for unsteadiness and difficulty walking secondary to a left hip fracture.  She exhibited decreased muscular strength bilaterally and required minimal assistance with sit to stand transfers.  She is at a high fall risk as evidenced by her history of falling, muscular strength, and gait pattern.  Recommend that she continue with skilled physical therapy to address her impairments to maximize her safety and functional mobility.  OBJECTIVE IMPAIRMENTS: Abnormal gait, decreased activity tolerance, decreased balance, decreased cognition, decreased endurance, decreased mobility, difficulty walking, decreased strength, and postural dysfunction.   ACTIVITY LIMITATIONS: bending, standing, squatting, stairs, transfers, dressing, and locomotion level  PARTICIPATION LIMITATIONS: meal prep, cleaning, shopping, and yard work  PERSONAL FACTORS: Age, Past/current experiences, Time since onset of injury/illness/exacerbation, Transportation, and 3+ comorbidities: HTN, CHF, DM, glaucoma, depression, hernia, pacemaker, and cognitive deficits   are also affecting patient's functional outcome.   REHAB POTENTIAL: Fair    CLINICAL DECISION MAKING: Evolving/moderate complexity  EVALUATION COMPLEXITY: Moderate   GOALS: Goals reviewed with patient? Yes  LONG TERM GOALS: Target  date: 08/23/22  Patient will be able to complete her HEP with assistance. Baseline:  Goal status: INITIAL  2.  Patient will be able to independently transfer from sitting to standing for improved independence.  Baseline:  Goal status: INITIAL  3.  Patient will be able to demonstrate improved foot clearance with ambulation for improved safety stepping over items in the floor.  Baseline:  Goal status: INITIAL  4.  Patient will be able to stand for at least 20 minutes for improved function washing dishes.  Baseline: 10 minutes currently Goal status: INITIAL  5.  Patient will improve her right quadriceps strength to at least 4-/5 for improved function standing. Baseline:  Goal status: INITIAL  PLAN:  PT FREQUENCY: 2x/week  PT DURATION: 4 weeks  PLANNED INTERVENTIONS: Therapeutic exercises, Therapeutic activity, Neuromuscular re-education, Balance training, Gait training, Patient/Family education, Self Care, Joint mobilization, Stair training, Cryotherapy, Moist heat, Manual therapy, and Re-evaluation  PLAN FOR NEXT SESSION: Nustep, lower extremity strengthening, and balance interventions   Granville Lewis, PT 07/26/2022, 4:03 PM

## 2022-07-28 ENCOUNTER — Ambulatory Visit: Payer: Medicare Other

## 2022-07-28 DIAGNOSIS — R2681 Unsteadiness on feet: Secondary | ICD-10-CM

## 2022-07-28 DIAGNOSIS — M6281 Muscle weakness (generalized): Secondary | ICD-10-CM | POA: Diagnosis not present

## 2022-07-28 NOTE — Therapy (Signed)
OUTPATIENT PHYSICAL THERAPY LOWER EXTREMITY TREATMENT   Patient Name: Brittany [name] MRN: [id number] DOB:01/08/1928, 89 y.o., female Today's Date: 07/28/2022  END OF SESSION:  PT End of Session - 07/28/22 1035     Visit Number 2    Number of Visits 8    Date for PT Re-Evaluation 10/06/22    PT Start Time 0932    PT Stop Time 1010    PT Time Calculation (min) 38 min    Activity Tolerance Patient tolerated treatment well    Behavior During Therapy WFL for tasks assessed/performed              Past Medical History:  Diagnosis Date   Atrial flutter (HCC)    CAD (coronary artery disease)    CHF (congestive heart failure) (HCC)    Complete heart block (HCC)    a. s/p STJ dual chamber PPM   Depression    Glaucoma    Hyperlipidemia    Hypertension    Pacemaker    Type II diabetes mellitus (HCC)    Past Surgical History:  Procedure Laterality Date   BREAST BIOPSY  1991   ELECTROPHYSIOLOGIC STUDY N/A 02/12/2015   cardioversion by Dr Graciela Husbands    EP IMPLANTABLE DEVICE N/A 04/07/2015   a. STJ dual chamber PPM implanted 2006 for complete heart block b. gen change 2017   FEMUR IM NAIL  01/18/2012   Procedure: INTRAMEDULLARY (IM) NAIL FEMORAL;  Surgeon: Senaida Lange, MD;  Location: [facility];  Service: Orthopedics;  Laterality: Left;   INGUINAL HERNIA REPAIR Right 03/22/2022   Procedure: HERNIA REPAIR INGUINAL ADULT;  Surgeon: Lucretia Roers, MD;  Location: [facility];  Service: General;  Laterality: Right;   INTRAMEDULLARY (IM) NAIL INTERTROCHANTERIC Right 05/16/2022   Procedure: INTRAMEDULLARY (IM) NAIL INTERTROCHANTERIC;  Surgeon: Vickki Hearing, MD;  Location: [facility];  Service: Orthopedics;  Laterality: Right;  on eliquis needs general   left total knee replacement Bilateral 1996   TONSILLECTOMY  1937   Patient Active Problem List   Diagnosis Date Noted   Hip fracture (HCC) 05/15/2022   Pancreatic lesion 04/24/2022   Regurgitation of food 04/24/2022   Unsteadiness  04/21/2022   Preop cardiovascular exam 03/21/2022   Incarcerated right inguinal hernia 03/19/2022   Lactic acidosis 03/19/2022   LBBB (left bundle branch block) 03/19/2022   Type 2 diabetes mellitus with hyperglycemia (HCC) 03/19/2022   Constipation 03/19/2022   Obesity (BMI 30-39.9) 03/19/2022   Prolonged QT interval 03/19/2022   Acute metabolic encephalopathy 02/28/2022   Hypokalemia 02/28/2022   Leg swelling 06/22/2020   Atrial flutter (HCC) 05/06/2020   CHF (congestive heart failure) (HCC) 01/02/2015   Chronic diastolic CHF (congestive heart failure) (HCC) 01/02/2015   DM2 (diabetes mellitus, type 2) (HCC) 01/02/2015   Vitamin D deficiency 07/21/2013   Belching 10/22/2012   SOB (shortness of breath) 10/22/2012   Lower extremity numbness 06/20/2012   Fracture of right hip, closed, with routine healing, subsequent encounter 01/18/2012   HYPERSOMNIA UNSPECIFIED 06/23/2008   EDEMA 06/23/2008   GLAUCOMA 05/27/2008   LAD-balloon angioplasty/cutting balloon-2007 05/27/2008   AV BLOCK, COMPLETE 05/27/2008   Pacemaker 05/27/2008   Glaucoma 05/27/2008   HYPERLIPIDEMIA 11/05/2006   Essential hypertension 11/05/2006   REFERRING PROVIDER: Rebecka Apley, NP   REFERRING DIAG: Fracture of unspecified part of neck of left femur, sequela; Unsteadiness on feet   THERAPY DIAG:  Muscle weakness (generalized)  Unsteadiness on feet  Rationale for Evaluation and Treatment: Rehabilitation  ONSET DATE:  05/15/22  SUBJECTIVE:   SUBJECTIVE STATEMENT: Patient reports that she fell and broke her right hip. Her daughter notes that she has had a rough time since surgery with pain and last night was the first time her bandage was changed without any discharge.   PERTINENT HISTORY: HTN, CHF, DM, glaucoma, depression, hernia, pacemaker, and cognitive deficits  PAIN:  Are you having pain? No  PRECAUTIONS: Fall  RED FLAGS: None   WEIGHT BEARING RESTRICTIONS: No  FALLS:  Has patient  fallen in last 6 months? Yes. Number of falls 2  LIVING ENVIRONMENT: Lives with: lives with their family Lives in: House/apartment Stairs: Yes: External: 2 steps; none Has following equipment at home: Environmental consultant - 2 wheeled and Wheelchair (manual)  OCCUPATION: retired  PLOF: Independent  PATIENT GOALS: be able to walk better, be able to get into and out of her bathroom independently  NEXT MD VISIT: 10/18/22  OBJECTIVE:   DIAGNOSTIC FINDINGS: 07/10/22 right hip x-ray  Impression healing fracture intertrochanteric region right hip with intact hardware without complication   COGNITION: Overall cognitive status: Impaired     SENSATION: WFL  LOWER EXTREMITY ROM: unable to assess due to inability to achieve proper testing position  LOWER EXTREMITY MMT:  MMT Right eval Left eval  Hip flexion 3/5 3/5  Hip extension    Hip abduction 4-/5; assessed in sitting 4/5; assessed in sitting  Hip adduction 4/5 4/5  Hip internal rotation    Hip external rotation    Knee flexion    Knee extension 3/5 4-/5  Ankle dorsiflexion 3/5 3/5  Ankle plantarflexion    Ankle inversion    Ankle eversion     (Blank rows = not tested)  FUNCTIONAL TESTS:  Sit to stand transfer: minA for safety with minimal verbal cueing Stand to sit transfer: SBA for safety with verbal cueing to back up to her seat prior to sitting   GAIT: Assistive device utilized: Environmental consultant - 2 wheeled and Wheelchair (manual) Level of assistance: SBA Comments: Dan Humphreys was utilized to chair to table transfer: she exhibited scissoring pattern as she repeatedly stepped on right foot with her left lower extremity Wheelchair was utilized for longer distances   TODAY'S TREATMENT:                                                                                                                              DATE:                                      EXERCISE LOG  Exercise Repetitions and Resistance Comments  Seated toe raises 10 reps each    LAQ 2 x 15 reps each    Seated marching  20 reps each    Seated hip ADD isometric  10 reps w/ 5 second hold    Seated hip ABD isometric  10 reps w/ 5  second hold    Seated heel raise 10 reps each    Seated toe taps on 6" step  10 reps each    Seated side stepping  15 reps each    Shoulder isometric press down  20 reps w/ 5 second hold  For improved UE strength for improved ease with sit to stand transfers  Sit to stand  5 reps  With minA   Walking  69 feet  With rolling walker and minA for safety    Blank cell = exercise not performed today                                    07/26/22 EXERCISE LOG  Exercise Repetitions and Resistance Comments  LAQ 10 reps each    Seated marching  10 reps each    Seated heel/toe raises  10 reps each    Seated toe taps 8 reps each    Seated hip abduction  10 reps each     Blank cell = exercise not performed today   PATIENT EDUCATION:  Education details: POC, healing, prognosis, safety, HEP, and goals for therapy Person educated: Patient and Child(ren) Education method: Explanation Education comprehension: verbalized understanding  HOME EXERCISE PROGRAM: Today's interventions were added to her HEP. Her daughter reported understanding  ASSESSMENT:  CLINICAL IMPRESSION: Patient was introduced to multiple new interventions for improved lower extremity strength for improved function with transfers. She required moderate to maximal multimodal cueing with today's intervention for exercise performance. She reported increased pain after walking at the end of today's appointment, but she was unable to verbalize where her pain was located. Recommend that she continue with skilled physical therapy to address her remaining impairments to maximize her functional mobility.   OBJECTIVE IMPAIRMENTS: Abnormal gait, decreased activity tolerance, decreased balance, decreased cognition, decreased endurance, decreased mobility, difficulty walking, decreased strength, and  postural dysfunction.   ACTIVITY LIMITATIONS: bending, standing, squatting, stairs, transfers, dressing, and locomotion level  PARTICIPATION LIMITATIONS: meal prep, cleaning, shopping, and yard work  PERSONAL FACTORS: Age, Past/current experiences, Time since onset of injury/illness/exacerbation, Transportation, and 3+ comorbidities: HTN, CHF, DM, glaucoma, depression, hernia, pacemaker, and cognitive deficits   are also affecting patient's functional outcome.   REHAB POTENTIAL: Fair    CLINICAL DECISION MAKING: Evolving/moderate complexity  EVALUATION COMPLEXITY: Moderate   GOALS: Goals reviewed with patient? Yes  LONG TERM GOALS: Target date: 08/23/22  Patient will be able to complete her HEP with assistance. Baseline:  Goal status: INITIAL  2.  Patient will be able to independently transfer from sitting to standing for improved independence.  Baseline:  Goal status: INITIAL  3.  Patient will be able to demonstrate improved foot clearance with ambulation for improved safety stepping over items in the floor.  Baseline:  Goal status: INITIAL  4.  Patient will be able to stand for at least 20 minutes for improved function washing dishes.  Baseline: 10 minutes currently Goal status: INITIAL  5.  Patient will improve her right quadriceps strength to at least 4-/5 for improved function standing. Baseline:  Goal status: INITIAL  PLAN:  PT FREQUENCY: 2x/week  PT DURATION: 4 weeks  PLANNED INTERVENTIONS: Therapeutic exercises, Therapeutic activity, Neuromuscular re-education, Balance training, Gait training, Patient/Family education, Self Care, Joint mobilization, Stair training, Cryotherapy, Moist heat, Manual therapy, and Re-evaluation  PLAN FOR NEXT SESSION: Nustep, lower extremity strengthening, and balance interventions   Granville Lewis, PT  07/28/2022, 12:53 PM

## 2022-08-01 ENCOUNTER — Ambulatory Visit: Payer: Medicare Other

## 2022-08-01 DIAGNOSIS — M6281 Muscle weakness (generalized): Secondary | ICD-10-CM

## 2022-08-01 DIAGNOSIS — R2681 Unsteadiness on feet: Secondary | ICD-10-CM

## 2022-08-01 NOTE — Therapy (Signed)
OUTPATIENT PHYSICAL THERAPY LOWER EXTREMITY TREATMENT   Patient Name: Brittany [name] MRN: [id number] DOB:Oct 15, 1927, 89 y.o., female Today's Date: 08/01/2022  END OF SESSION:  PT End of Session - 08/01/22 0952     Visit Number 3    Number of Visits 8    Date for PT Re-Evaluation 10/06/22    PT Start Time 0930    PT Stop Time 1011    PT Time Calculation (min) 41 min    Activity Tolerance Patient tolerated treatment well    Behavior During Therapy [facility] for tasks assessed/performed              Past Medical History:  Diagnosis Date   Atrial flutter (HCC)    CAD (coronary artery disease)    CHF (congestive heart failure) (HCC)    Complete heart block (HCC)    a. s/p STJ dual chamber PPM   Depression    Glaucoma    Hyperlipidemia    Hypertension    Pacemaker    Type II diabetes mellitus (HCC)    Past Surgical History:  Procedure Laterality Date   BREAST BIOPSY  1991   ELECTROPHYSIOLOGIC STUDY N/A 02/12/2015   cardioversion by Dr Graciela Husbands    EP IMPLANTABLE DEVICE N/A 04/07/2015   a. STJ dual chamber PPM implanted 2006 for complete heart block b. gen change 2017   FEMUR IM NAIL  01/18/2012   Procedure: INTRAMEDULLARY (IM) NAIL FEMORAL;  Surgeon: Senaida Lange, MD;  Location: [facility];  Service: Orthopedics;  Laterality: Left;   INGUINAL HERNIA REPAIR Right 03/22/2022   Procedure: HERNIA REPAIR INGUINAL ADULT;  Surgeon: Lucretia Roers, MD;  Location: [facility];  Service: General;  Laterality: Right;   INTRAMEDULLARY (IM) NAIL INTERTROCHANTERIC Right 05/16/2022   Procedure: INTRAMEDULLARY (IM) NAIL INTERTROCHANTERIC;  Surgeon: Vickki Hearing, MD;  Location: [facility];  Service: Orthopedics;  Laterality: Right;  on eliquis needs general   left total knee replacement Bilateral 1996   TONSILLECTOMY  1937   Patient Active Problem List   Diagnosis Date Noted   Hip fracture (HCC) 05/15/2022   Pancreatic lesion 04/24/2022   Regurgitation of food 04/24/2022   Unsteadiness  04/21/2022   Preop cardiovascular exam 03/21/2022   Incarcerated right inguinal hernia 03/19/2022   Lactic acidosis 03/19/2022   LBBB (left bundle branch block) 03/19/2022   Type 2 diabetes mellitus with hyperglycemia (HCC) 03/19/2022   Constipation 03/19/2022   Obesity (BMI 30-39.9) 03/19/2022   Prolonged QT interval 03/19/2022   Acute metabolic encephalopathy 02/28/2022   Hypokalemia 02/28/2022   Leg swelling 06/22/2020   Atrial flutter (HCC) 05/06/2020   CHF (congestive heart failure) (HCC) 01/02/2015   Chronic diastolic CHF (congestive heart failure) (HCC) 01/02/2015   DM2 (diabetes mellitus, type 2) (HCC) 01/02/2015   Vitamin D deficiency 07/21/2013   Belching 10/22/2012   SOB (shortness of breath) 10/22/2012   Lower extremity numbness 06/20/2012   Fracture of right hip, closed, with routine healing, subsequent encounter 01/18/2012   HYPERSOMNIA UNSPECIFIED 06/23/2008   EDEMA 06/23/2008   GLAUCOMA 05/27/2008   LAD-balloon angioplasty/cutting balloon-2007 05/27/2008   AV BLOCK, COMPLETE 05/27/2008   Pacemaker 05/27/2008   Glaucoma 05/27/2008   HYPERLIPIDEMIA 11/05/2006   Essential hypertension 11/05/2006   REFERRING PROVIDER: Rebecka Apley, NP   REFERRING DIAG: Fracture of unspecified part of neck of left femur, sequela; Unsteadiness on feet   THERAPY DIAG:  Muscle weakness (generalized)  Unsteadiness on feet  Rationale for Evaluation and Treatment: Rehabilitation  ONSET DATE:  05/15/22  SUBJECTIVE:   SUBJECTIVE STATEMENT: Pt arrives for today's treatment session denying any pain.  PERTINENT HISTORY: HTN, CHF, DM, glaucoma, depression, hernia, pacemaker, and cognitive deficits  PAIN:  Are you having pain? No  PRECAUTIONS: Fall  RED FLAGS: None   WEIGHT BEARING RESTRICTIONS: No  FALLS:  Has patient fallen in last 6 months? Yes. Number of falls 2  LIVING ENVIRONMENT: Lives with: lives with their family Lives in: House/apartment Stairs: Yes:  External: 2 steps; none Has following equipment at home: Environmental consultant - 2 wheeled and Wheelchair (manual)  OCCUPATION: retired  PLOF: Independent  PATIENT GOALS: be able to walk better, be able to get into and out of her bathroom independently  NEXT MD VISIT: 10/18/22  OBJECTIVE:   DIAGNOSTIC FINDINGS: 07/10/22 right hip x-ray  Impression healing fracture intertrochanteric region right hip with intact hardware without complication   COGNITION: Overall cognitive status: Impaired     SENSATION: WFL  LOWER EXTREMITY ROM: unable to assess due to inability to achieve proper testing position  LOWER EXTREMITY MMT:  MMT Right eval Left eval  Hip flexion 3/5 3/5  Hip extension    Hip abduction 4-/5; assessed in sitting 4/5; assessed in sitting  Hip adduction 4/5 4/5  Hip internal rotation    Hip external rotation    Knee flexion    Knee extension 3/5 4-/5  Ankle dorsiflexion 3/5 3/5  Ankle plantarflexion    Ankle inversion    Ankle eversion     (Blank rows = not tested)  FUNCTIONAL TESTS:  Sit to stand transfer: minA for safety with minimal verbal cueing Stand to sit transfer: SBA for safety with verbal cueing to back up to her seat prior to sitting   GAIT: Assistive device utilized: Environmental consultant - 2 wheeled and Wheelchair (manual) Level of assistance: SBA Comments: Dan Humphreys was utilized to chair to table transfer: she exhibited scissoring pattern as she repeatedly stepped on right foot with her left lower extremity Wheelchair was utilized for longer distances   TODAY'S TREATMENT:                                                                                                                              DATE:                                      EXERCISE LOG  Exercise Repetitions and Resistance Comments  Nustep Lvl 1 x 10 mins    Seated toe raises 15 reps each   LAQ 2 x 15 reps each    Seated marching  20 reps each    Seated hip ADD isometric  15 reps w/ 5 second hold    Seated  hip ABD isometric  15 reps w/ 5 second hold    Seated heel raise 15 reps each    Seated toe taps on 6" step  20 reps each    Seated side stepping  15 reps each    Shoulder isometric press down  20 reps w/ 5 second hold  For improved UE strength for improved ease with sit to stand transfers  Shoulder Press Up 1# x 10 reps bil   Bicep Curls 1# x 10 reps bil   Sit to stand  5 reps  With minA   Walking  30 feet; 93 feet With rolling walker and minA for safety    Blank cell = exercise not performed today                                    07/26/22 EXERCISE LOG  Exercise Repetitions and Resistance Comments  LAQ 10 reps each    Seated marching  10 reps each    Seated heel/toe raises  10 reps each    Seated toe taps 8 reps each    Seated hip abduction  10 reps each     Blank cell = exercise not performed today   PATIENT EDUCATION:  Education details: POC, healing, prognosis, safety, HEP, and goals for therapy Person educated: Patient and Child(ren) Education method: Explanation Education comprehension: verbalized understanding  HOME EXERCISE PROGRAM: Today's interventions were added to her HEP. Her daughter reported understanding  ASSESSMENT:  CLINICAL IMPRESSION: Pt arrives for today's treatment session denying any pain.  Pt able to tolerate warm up on Nustep today for ten minutes.  Pt able to increase ambulation distance today with encouragement required from PTA and daughter.  Pt able to tolerate increased reps with all seated exercises today.  Pt works well with targets to kick/tap with LE exercises.  Pt denied any pain at completion of today's treatment session.   OBJECTIVE IMPAIRMENTS: Abnormal gait, decreased activity tolerance, decreased balance, decreased cognition, decreased endurance, decreased mobility, difficulty walking, decreased strength, and postural dysfunction.   ACTIVITY LIMITATIONS: bending, standing, squatting, stairs, transfers, dressing, and locomotion  level  PARTICIPATION LIMITATIONS: meal prep, cleaning, shopping, and yard work  PERSONAL FACTORS: Age, Past/current experiences, Time since onset of injury/illness/exacerbation, Transportation, and 3+ comorbidities: HTN, CHF, DM, glaucoma, depression, hernia, pacemaker, and cognitive deficits   are also affecting patient's functional outcome.   REHAB POTENTIAL: Fair    CLINICAL DECISION MAKING: Evolving/moderate complexity  EVALUATION COMPLEXITY: Moderate   GOALS: Goals reviewed with patient? Yes  LONG TERM GOALS: Target date: 08/23/22  Patient will be able to complete her HEP with assistance. Baseline:  Goal status: INITIAL  2.  Patient will be able to independently transfer from sitting to standing for improved independence.  Baseline:  Goal status: INITIAL  3.  Patient will be able to demonstrate improved foot clearance with ambulation for improved safety stepping over items in the floor.  Baseline:  Goal status: INITIAL  4.  Patient will be able to stand for at least 20 minutes for improved function washing dishes.  Baseline: 10 minutes currently Goal status: INITIAL  5.  Patient will improve her right quadriceps strength to at least 4-/5 for improved function standing. Baseline:  Goal status: INITIAL  PLAN:  PT FREQUENCY: 2x/week  PT DURATION: 4 weeks  PLANNED INTERVENTIONS: Therapeutic exercises, Therapeutic activity, Neuromuscular re-education, Balance training, Gait training, Patient/Family education, Self Care, Joint mobilization, Stair training, Cryotherapy, Moist heat, Manual therapy, and Re-evaluation  PLAN FOR NEXT SESSION: Nustep, lower extremity strengthening, and balance interventions   Newman Pies, PTA 08/01/2022,  11:16 AM

## 2022-08-03 ENCOUNTER — Ambulatory Visit: Payer: Medicare Other

## 2022-08-03 DIAGNOSIS — M6281 Muscle weakness (generalized): Secondary | ICD-10-CM

## 2022-08-03 DIAGNOSIS — R2681 Unsteadiness on feet: Secondary | ICD-10-CM

## 2022-08-03 NOTE — Therapy (Signed)
OUTPATIENT PHYSICAL THERAPY LOWER EXTREMITY TREATMENT   Patient Name: Brittany [name] MRN: [id number] DOB:Apr 13, 1927, 89 y.o., female Today's Date: 08/03/2022  END OF SESSION:  PT End of Session - 08/03/22 0940     Visit Number 4    Number of Visits 8    Date for PT Re-Evaluation 10/06/22    PT Start Time 0930    PT Stop Time 1016    PT Time Calculation (min) 46 min    Activity Tolerance Patient tolerated treatment well    Behavior During Therapy [facility] for tasks assessed/performed               Past Medical History:  Diagnosis Date   Atrial flutter (HCC)    CAD (coronary artery disease)    CHF (congestive heart failure) (HCC)    Complete heart block (HCC)    a. s/p STJ dual chamber PPM   Depression    Glaucoma    Hyperlipidemia    Hypertension    Pacemaker    Type II diabetes mellitus (HCC)    Past Surgical History:  Procedure Laterality Date   BREAST BIOPSY  1991   ELECTROPHYSIOLOGIC STUDY N/A 02/12/2015   cardioversion by Dr Graciela Husbands    EP IMPLANTABLE DEVICE N/A 04/07/2015   a. STJ dual chamber PPM implanted 2006 for complete heart block b. gen change 2017   FEMUR IM NAIL  01/18/2012   Procedure: INTRAMEDULLARY (IM) NAIL FEMORAL;  Surgeon: Senaida Lange, MD;  Location: [facility] OR;  Service: Orthopedics;  Laterality: Left;   INGUINAL HERNIA REPAIR Right 03/22/2022   Procedure: HERNIA REPAIR INGUINAL ADULT;  Surgeon: Lucretia Roers, MD;  Location: [facility];  Service: General;  Laterality: Right;   INTRAMEDULLARY (IM) NAIL INTERTROCHANTERIC Right 05/16/2022   Procedure: INTRAMEDULLARY (IM) NAIL INTERTROCHANTERIC;  Surgeon: Vickki Hearing, MD;  Location: [facility];  Service: Orthopedics;  Laterality: Right;  on eliquis needs general   left total knee replacement Bilateral 1996   TONSILLECTOMY  1937   Patient Active Problem List   Diagnosis Date Noted   Hip fracture (HCC) 05/15/2022   Pancreatic lesion 04/24/2022   Regurgitation of food 04/24/2022   Unsteadiness  04/21/2022   Preop cardiovascular exam 03/21/2022   Incarcerated right inguinal hernia 03/19/2022   Lactic acidosis 03/19/2022   LBBB (left bundle branch block) 03/19/2022   Type 2 diabetes mellitus with hyperglycemia (HCC) 03/19/2022   Constipation 03/19/2022   Obesity (BMI 30-39.9) 03/19/2022   Prolonged QT interval 03/19/2022   Acute metabolic encephalopathy 02/28/2022   Hypokalemia 02/28/2022   Leg swelling 06/22/2020   Atrial flutter (HCC) 05/06/2020   CHF (congestive heart failure) (HCC) 01/02/2015   Chronic diastolic CHF (congestive heart failure) (HCC) 01/02/2015   DM2 (diabetes mellitus, type 2) (HCC) 01/02/2015   Vitamin D deficiency 07/21/2013   Belching 10/22/2012   SOB (shortness of breath) 10/22/2012   Lower extremity numbness 06/20/2012   Fracture of right hip, closed, with routine healing, subsequent encounter 01/18/2012   HYPERSOMNIA UNSPECIFIED 06/23/2008   EDEMA 06/23/2008   GLAUCOMA 05/27/2008   LAD-balloon angioplasty/cutting balloon-2007 05/27/2008   AV BLOCK, COMPLETE 05/27/2008   Pacemaker 05/27/2008   Glaucoma 05/27/2008   HYPERLIPIDEMIA 11/05/2006   Essential hypertension 11/05/2006   REFERRING PROVIDER: Rebecka Apley, NP   REFERRING DIAG: Fracture of unspecified part of neck of left femur, sequela; Unsteadiness on feet   THERAPY DIAG:  Muscle weakness (generalized)  Unsteadiness on feet  Rationale for Evaluation and Treatment: Rehabilitation  ONSET  DATE: 05/15/22  SUBJECTIVE:   SUBJECTIVE STATEMENT: Her daughter notes that she was able to get out of her car without using the step today.   PERTINENT HISTORY: HTN, CHF, DM, glaucoma, depression, hernia, pacemaker, and cognitive deficits  PAIN:  Are you having pain? No  PRECAUTIONS: Fall  RED FLAGS: None   WEIGHT BEARING RESTRICTIONS: No  FALLS:  Has patient fallen in last 6 months? Yes. Number of falls 2  LIVING ENVIRONMENT: Lives with: lives with their family Lives in:  House/apartment Stairs: Yes: External: 2 steps; none Has following equipment at home: Environmental consultant - 2 wheeled and Wheelchair (manual)  OCCUPATION: retired  PLOF: Independent  PATIENT GOALS: be able to walk better, be able to get into and out of her bathroom independently  NEXT MD VISIT: 10/18/22  OBJECTIVE:   DIAGNOSTIC FINDINGS: 07/10/22 right hip x-ray  Impression healing fracture intertrochanteric region right hip with intact hardware without complication   COGNITION: Overall cognitive status: Impaired     SENSATION: WFL  LOWER EXTREMITY ROM: unable to assess due to inability to achieve proper testing position  LOWER EXTREMITY MMT:  MMT Right eval Left eval  Hip flexion 3/5 3/5  Hip extension    Hip abduction 4-/5; assessed in sitting 4/5; assessed in sitting  Hip adduction 4/5 4/5  Hip internal rotation    Hip external rotation    Knee flexion    Knee extension 3/5 4-/5  Ankle dorsiflexion 3/5 3/5  Ankle plantarflexion    Ankle inversion    Ankle eversion     (Blank rows = not tested)  FUNCTIONAL TESTS:  Sit to stand transfer: minA for safety with minimal verbal cueing Stand to sit transfer: SBA for safety with verbal cueing to back up to her seat prior to sitting   GAIT: Assistive device utilized: Environmental consultant - 2 wheeled and Wheelchair (manual) Level of assistance: SBA Comments: Dan Humphreys was utilized to chair to table transfer: she exhibited scissoring pattern as she repeatedly stepped on right foot with her left lower extremity Wheelchair was utilized for longer distances   TODAY'S TREATMENT:                                                                                                                              DATE:                                     08/03/22 EXERCISE LOG  Exercise Repetitions and Resistance Comments  Nustep  L1 x 10 minutes   Rocker board  2 x 25 reps   LAQ 3 x 10 reps each    Seated toe taps 15 reps each    Standing toe taps 20 reps each     Seated ball kicks  20 reps each    Seated hip ABD  10 reps each   Walking 26 feet and 66  feet  With rolling walker; cueing for improved left foot clearance for safety    Blank cell = exercise not performed today                                    08/01/22 EXERCISE LOG  Exercise Repetitions and Resistance Comments  Nustep Lvl 1 x 10 mins    Seated toe raises 15 reps each   LAQ 2 x 15 reps each    Seated marching  20 reps each    Seated hip ADD isometric  15 reps w/ 5 second hold    Seated hip ABD isometric  15 reps w/ 5 second hold    Seated heel raise 15 reps each    Seated toe taps on 6" step  20 reps each    Seated side stepping  15 reps each    Shoulder isometric press down  20 reps w/ 5 second hold  For improved UE strength for improved ease with sit to stand transfers  Shoulder Press Up 1# x 10 reps bil   Bicep Curls 1# x 10 reps bil   Sit to stand  5 reps  With minA   Walking  30 feet; 93 feet With rolling walker and minA for safety    Blank cell = exercise not performed today                                    07/26/22 EXERCISE LOG  Exercise Repetitions and Resistance Comments  LAQ 10 reps each    Seated marching  10 reps each    Seated heel/toe raises  10 reps each    Seated toe taps 8 reps each    Seated hip abduction  10 reps each     Blank cell = exercise not performed today   PATIENT EDUCATION:  Education details: POC, healing, prognosis, safety, HEP, and goals for therapy Person educated: Patient and Child(ren) Education method: Explanation Education comprehension: verbalized understanding  HOME EXERCISE PROGRAM: Today's interventions were added to her HEP. Her daughter reported understanding  ASSESSMENT:  CLINICAL IMPRESSION: Patient was standing toe taps and seated kicking for improved standing dynamic stability and lower extremity coordination. She required minimal cueing with seated ball kicks for proper control as she would attempt to kick the ball  prior to its arrival on multiple attempts with both lower extremities. She reported increased fatigue with today's interventions. She continues to require skilled physical therapy to address her impairments to maximize her functional mobility.   OBJECTIVE IMPAIRMENTS: Abnormal gait, decreased activity tolerance, decreased balance, decreased cognition, decreased endurance, decreased mobility, difficulty walking, decreased strength, and postural dysfunction.   ACTIVITY LIMITATIONS: bending, standing, squatting, stairs, transfers, dressing, and locomotion level  PARTICIPATION LIMITATIONS: meal prep, cleaning, shopping, and yard work  PERSONAL FACTORS: Age, Past/current experiences, Time since onset of injury/illness/exacerbation, Transportation, and 3+ comorbidities: HTN, CHF, DM, glaucoma, depression, hernia, pacemaker, and cognitive deficits   are also affecting patient's functional outcome.   REHAB POTENTIAL: Fair    CLINICAL DECISION MAKING: Evolving/moderate complexity  EVALUATION COMPLEXITY: Moderate   GOALS: Goals reviewed with patient? Yes  LONG TERM GOALS: Target date: 08/23/22  Patient will be able to complete her HEP with assistance. Baseline:  Goal status: INITIAL  2.  Patient will be able to independently transfer from sitting to standing  for improved independence.  Baseline:  Goal status: INITIAL  3.  Patient will be able to demonstrate improved foot clearance with ambulation for improved safety stepping over items in the floor.  Baseline:  Goal status: INITIAL  4.  Patient will be able to stand for at least 20 minutes for improved function washing dishes.  Baseline: 10 minutes currently Goal status: INITIAL  5.  Patient will improve her right quadriceps strength to at least 4-/5 for improved function standing. Baseline:  Goal status: INITIAL  PLAN:  PT FREQUENCY: 2x/week  PT DURATION: 4 weeks  PLANNED INTERVENTIONS: Therapeutic exercises, Therapeutic  activity, Neuromuscular re-education, Balance training, Gait training, Patient/Family education, Self Care, Joint mobilization, Stair training, Cryotherapy, Moist heat, Manual therapy, and Re-evaluation  PLAN FOR NEXT SESSION: Nustep, lower extremity strengthening, and balance interventions   Granville Lewis, PT 08/03/2022, 1:15 PM

## 2022-08-07 ENCOUNTER — Ambulatory Visit (INDEPENDENT_AMBULATORY_CARE_PROVIDER_SITE_OTHER): Payer: Medicare Other

## 2022-08-07 ENCOUNTER — Ambulatory Visit: Payer: Medicare Other | Admitting: Orthopedic Surgery

## 2022-08-07 ENCOUNTER — Other Ambulatory Visit (INDEPENDENT_AMBULATORY_CARE_PROVIDER_SITE_OTHER): Payer: Medicare Other

## 2022-08-07 DIAGNOSIS — I442 Atrioventricular block, complete: Secondary | ICD-10-CM

## 2022-08-07 DIAGNOSIS — S72001D Fracture of unspecified part of neck of right femur, subsequent encounter for closed fracture with routine healing: Secondary | ICD-10-CM

## 2022-08-07 NOTE — Progress Notes (Signed)
   This is a postop visit  Encounter Diagnosis  Name Primary?   Fracture of right hip, closed, with routine healing, subsequent encounter 05/16/22 Yes    Chief Complaint  Patient presents with   Post-op Follow-up    05/16/22 right hip surgery/ able to stand, but unsteady afraid of falling    12 wks post op I'm nail right hip   Xrays: fracture looks like its healing on schedule and hwr is in good position  The area of where the wound was giving her some issues healed well with oral antibiotics, Doxycycline,   Assessment and plan  3 months f/u xrays

## 2022-08-08 ENCOUNTER — Ambulatory Visit: Payer: Medicare Other

## 2022-08-08 DIAGNOSIS — M6281 Muscle weakness (generalized): Secondary | ICD-10-CM

## 2022-08-08 DIAGNOSIS — R2681 Unsteadiness on feet: Secondary | ICD-10-CM

## 2022-08-08 NOTE — Therapy (Signed)
OUTPATIENT PHYSICAL THERAPY LOWER EXTREMITY TREATMENT   Patient Name: Brittany [name] MRN: [id number] DOB:Jun 16, 1927, 89 y.o., female Today's Date: 08/08/2022  END OF SESSION:  PT End of Session - 08/08/22 0944     Visit Number 5    Number of Visits 8    Date for PT Re-Evaluation 10/06/22    PT Start Time 0930    PT Stop Time 1021    PT Time Calculation (min) 51 min    Activity Tolerance Patient tolerated treatment well    Behavior During Therapy [facility] for tasks assessed/performed                Past Medical History:  Diagnosis Date   Atrial flutter (HCC)    CAD (coronary artery disease)    CHF (congestive heart failure) (HCC)    Complete heart block (HCC)    a. s/p STJ dual chamber PPM   Depression    Glaucoma    Hyperlipidemia    Hypertension    Pacemaker    Type II diabetes mellitus (HCC)    Past Surgical History:  Procedure Laterality Date   BREAST BIOPSY  1991   ELECTROPHYSIOLOGIC STUDY N/A 02/12/2015   cardioversion by Dr Graciela Husbands    EP IMPLANTABLE DEVICE N/A 04/07/2015   a. STJ dual chamber PPM implanted 2006 for complete heart block b. gen change 2017   FEMUR IM NAIL  01/18/2012   Procedure: INTRAMEDULLARY (IM) NAIL FEMORAL;  Surgeon: Senaida Lange, MD;  Location: [facility];  Service: Orthopedics;  Laterality: Left;   INGUINAL HERNIA REPAIR Right 03/22/2022   Procedure: HERNIA REPAIR INGUINAL ADULT;  Surgeon: Lucretia Roers, MD;  Location: [facility];  Service: General;  Laterality: Right;   INTRAMEDULLARY (IM) NAIL INTERTROCHANTERIC Right 05/16/2022   Procedure: INTRAMEDULLARY (IM) NAIL INTERTROCHANTERIC;  Surgeon: Vickki Hearing, MD;  Location: [facility];  Service: Orthopedics;  Laterality: Right;  on eliquis needs general   left total knee replacement Bilateral 1996   TONSILLECTOMY  1937   Patient Active Problem List   Diagnosis Date Noted   Hip fracture (HCC) 05/15/2022   Pancreatic lesion 04/24/2022   Regurgitation of food 04/24/2022   Unsteadiness  04/21/2022   Preop cardiovascular exam 03/21/2022   Incarcerated right inguinal hernia 03/19/2022   Lactic acidosis 03/19/2022   LBBB (left bundle branch block) 03/19/2022   Type 2 diabetes mellitus with hyperglycemia (HCC) 03/19/2022   Constipation 03/19/2022   Obesity (BMI 30-39.9) 03/19/2022   Prolonged QT interval 03/19/2022   Acute metabolic encephalopathy 02/28/2022   Hypokalemia 02/28/2022   Leg swelling 06/22/2020   Atrial flutter (HCC) 05/06/2020   CHF (congestive heart failure) (HCC) 01/02/2015   Chronic diastolic CHF (congestive heart failure) (HCC) 01/02/2015   DM2 (diabetes mellitus, type 2) (HCC) 01/02/2015   Vitamin D deficiency 07/21/2013   Belching 10/22/2012   SOB (shortness of breath) 10/22/2012   Lower extremity numbness 06/20/2012   Fracture of right hip, closed, with routine healing, subsequent encounter 01/18/2012   HYPERSOMNIA UNSPECIFIED 06/23/2008   EDEMA 06/23/2008   GLAUCOMA 05/27/2008   LAD-balloon angioplasty/cutting balloon-2007 05/27/2008   AV BLOCK, COMPLETE 05/27/2008   Pacemaker 05/27/2008   Glaucoma 05/27/2008   HYPERLIPIDEMIA 11/05/2006   Essential hypertension 11/05/2006   REFERRING PROVIDER: Rebecka Apley, NP   REFERRING DIAG: Fracture of unspecified part of neck of left femur, sequela; Unsteadiness on feet   THERAPY DIAG:  Muscle weakness (generalized)  Unsteadiness on feet  Rationale for Evaluation and Treatment: Rehabilitation  ONSET DATE: 05/15/22  SUBJECTIVE:   SUBJECTIVE STATEMENT: Her daughter was told that her hip is looking good when she saw her orthopedic surgeon yesterday. She was able to stand for her x-rays. Her daughter notes that she is still afraid of falling, but she is able to do more walking around her house.   PERTINENT HISTORY: HTN, CHF, DM, glaucoma, depression, hernia, pacemaker, and cognitive deficits  PAIN:  Are you having pain? No  PRECAUTIONS: Fall  RED FLAGS: None   WEIGHT BEARING  RESTRICTIONS: No  FALLS:  Has patient fallen in last 6 months? Yes. Number of falls 2  LIVING ENVIRONMENT: Lives with: lives with their family Lives in: House/apartment Stairs: Yes: External: 2 steps; none Has following equipment at home: Environmental consultant - 2 wheeled and Wheelchair (manual)  OCCUPATION: retired  PLOF: Independent  PATIENT GOALS: be able to walk better, be able to get into and out of her bathroom independently  NEXT MD VISIT: 10/18/22  OBJECTIVE:   DIAGNOSTIC FINDINGS: 07/10/22 right hip x-ray  Impression healing fracture intertrochanteric region right hip with intact hardware without complication   COGNITION: Overall cognitive status: Impaired     SENSATION: WFL  LOWER EXTREMITY ROM: unable to assess due to inability to achieve proper testing position  LOWER EXTREMITY MMT:  MMT Right eval Left eval  Hip flexion 3/5 3/5  Hip extension    Hip abduction 4-/5; assessed in sitting 4/5; assessed in sitting  Hip adduction 4/5 4/5  Hip internal rotation    Hip external rotation    Knee flexion    Knee extension 3/5 4-/5  Ankle dorsiflexion 3/5 3/5  Ankle plantarflexion    Ankle inversion    Ankle eversion     (Blank rows = not tested)  FUNCTIONAL TESTS:  Sit to stand transfer: minA for safety with minimal verbal cueing Stand to sit transfer: SBA for safety with verbal cueing to back up to her seat prior to sitting   GAIT: Assistive device utilized: Environmental consultant - 2 wheeled and Wheelchair (manual) Level of assistance: SBA Comments: Dan Humphreys was utilized to chair to table transfer: she exhibited scissoring pattern as she repeatedly stepped on right foot with her left lower extremity Wheelchair was utilized for longer distances   TODAY'S TREATMENT:                                                                                                                              DATE:                                     08/08/22 EXERCISE LOG  Exercise Repetitions and  Resistance Comments  LAQ 20 reps each    Seated marching  20 reps each    Seated hip ADD isometric  30 reps w/ 5 second hold   Seated clams  Red t-band x 20 reps  Nustep  L2 x 10 minutes   Sit to stand     Rocker board (seated, lateral)  2 minutes For ankle inversion and eversion  Walking 59 feet  With cueing for improved left toe clearance   Blank cell = exercise not performed today                                    08/03/22 EXERCISE LOG  Exercise Repetitions and Resistance Comments  Nustep  L1 x 10 minutes   Rocker board  2 x 25 reps   LAQ 3 x 10 reps each    Seated toe taps 15 reps each    Standing toe taps 20 reps each    Seated ball kicks  20 reps each    Seated hip ABD  10 reps each   Walking 26 feet and 66 feet  With rolling walker; cueing for improved left foot clearance for safety    Blank cell = exercise not performed today                                    08/01/22 EXERCISE LOG  Exercise Repetitions and Resistance Comments  Nustep Lvl 1 x 10 mins    Seated toe raises 15 reps each   LAQ 2 x 15 reps each    Seated marching  20 reps each    Seated hip ADD isometric  15 reps w/ 5 second hold    Seated hip ABD isometric  15 reps w/ 5 second hold    Seated heel raise 15 reps each    Seated toe taps on 6" step  20 reps each    Seated side stepping  15 reps each    Shoulder isometric press down  20 reps w/ 5 second hold  For improved UE strength for improved ease with sit to stand transfers  Shoulder Press Up 1# x 10 reps bil   Bicep Curls 1# x 10 reps bil   Sit to stand  5 reps  With minA   Walking  30 feet; 93 feet With rolling walker and minA for safety    Blank cell = exercise not performed today   PATIENT EDUCATION:  Education details: POC, healing, prognosis, safety, HEP, and goals for therapy Person educated: Patient and Child(ren) Education method: Explanation Education comprehension: verbalized understanding  HOME EXERCISE PROGRAM: Today's interventions  were added to her HEP. Her daughter reported understanding  ASSESSMENT:  CLINICAL IMPRESSION: Treatment focused on familiar interventions for improved lower extremity strength and mobility needed for improved function with transfers and other daily activities.. She required moderate verbal and visual cueing with today's interventions for proper exercise performance. She required cueing with ambulation for improved left toe clearance with poor effectiveness. However, her daughter noted that she is beginning to walk better and is more mobile at home since beginning therapy. She reported feeling "good" upon the conclusion of treatment. She continues to require skilled physical therapy to address her remaining impairments to maximize her safety and functional mobility.   OBJECTIVE IMPAIRMENTS: Abnormal gait, decreased activity tolerance, decreased balance, decreased cognition, decreased endurance, decreased mobility, difficulty walking, decreased strength, and postural dysfunction.   ACTIVITY LIMITATIONS: bending, standing, squatting, stairs, transfers, dressing, and locomotion level  PARTICIPATION LIMITATIONS: meal prep, cleaning, shopping, and yard work  PERSONAL FACTORS: Age, Past/current  experiences, Time since onset of injury/illness/exacerbation, Transportation, and 3+ comorbidities: HTN, CHF, DM, glaucoma, depression, hernia, pacemaker, and cognitive deficits   are also affecting patient's functional outcome.   REHAB POTENTIAL: Fair    CLINICAL DECISION MAKING: Evolving/moderate complexity  EVALUATION COMPLEXITY: Moderate   GOALS: Goals reviewed with patient? Yes  LONG TERM GOALS: Target date: 08/23/22  Patient will be able to complete her HEP with assistance. Baseline:  Goal status: INITIAL  2.  Patient will be able to independently transfer from sitting to standing for improved independence.  Baseline:  Goal status: INITIAL  3.  Patient will be able to demonstrate improved foot  clearance with ambulation for improved safety stepping over items in the floor.  Baseline:  Goal status: INITIAL  4.  Patient will be able to stand for at least 20 minutes for improved function washing dishes.  Baseline: 10 minutes currently Goal status: INITIAL  5.  Patient will improve her right quadriceps strength to at least 4-/5 for improved function standing. Baseline:  Goal status: INITIAL  PLAN:  PT FREQUENCY: 2x/week  PT DURATION: 4 weeks  PLANNED INTERVENTIONS: Therapeutic exercises, Therapeutic activity, Neuromuscular re-education, Balance training, Gait training, Patient/Family education, Self Care, Joint mobilization, Stair training, Cryotherapy, Moist heat, Manual therapy, and Re-evaluation  PLAN FOR NEXT SESSION: Nustep, lower extremity strengthening, and balance interventions   Granville Lewis, PT 08/08/2022, 12:25 PM

## 2022-08-10 ENCOUNTER — Ambulatory Visit: Payer: Medicare Other | Attending: Adult Health Nurse Practitioner

## 2022-08-10 DIAGNOSIS — M6281 Muscle weakness (generalized): Secondary | ICD-10-CM | POA: Insufficient documentation

## 2022-08-10 DIAGNOSIS — R2681 Unsteadiness on feet: Secondary | ICD-10-CM | POA: Insufficient documentation

## 2022-08-10 NOTE — Therapy (Signed)
OUTPATIENT PHYSICAL THERAPY LOWER EXTREMITY TREATMENT   Patient Name: Brittany [name] MRN: [id number] DOB:May 13, 1927, 89 y.o., female Today's Date: 08/10/2022  END OF SESSION:  PT End of Session - 08/10/22 0935     Visit Number 6    Number of Visits 8    Date for PT Re-Evaluation 10/06/22    PT Start Time 0930    PT Stop Time 1015    PT Time Calculation (min) 45 min    Activity Tolerance Patient tolerated treatment well    Behavior During Therapy [facility] for tasks assessed/performed                Past Medical History:  Diagnosis Date   Atrial flutter (HCC)    CAD (coronary artery disease)    CHF (congestive heart failure) (HCC)    Complete heart block (HCC)    a. s/p STJ dual chamber PPM   Depression    Glaucoma    Hyperlipidemia    Hypertension    Pacemaker    Type II diabetes mellitus (HCC)    Past Surgical History:  Procedure Laterality Date   BREAST BIOPSY  1991   ELECTROPHYSIOLOGIC STUDY N/A 02/12/2015   cardioversion by Dr Graciela Husbands    EP IMPLANTABLE DEVICE N/A 04/07/2015   a. STJ dual chamber PPM implanted 2006 for complete heart block b. gen change 2017   FEMUR IM NAIL  01/18/2012   Procedure: INTRAMEDULLARY (IM) NAIL FEMORAL;  Surgeon: Senaida Lange, MD;  Location: [facility];  Service: Orthopedics;  Laterality: Left;   INGUINAL HERNIA REPAIR Right 03/22/2022   Procedure: HERNIA REPAIR INGUINAL ADULT;  Surgeon: Lucretia Roers, MD;  Location: [facility];  Service: General;  Laterality: Right;   INTRAMEDULLARY (IM) NAIL INTERTROCHANTERIC Right 05/16/2022   Procedure: INTRAMEDULLARY (IM) NAIL INTERTROCHANTERIC;  Surgeon: Vickki Hearing, MD;  Location: [facility];  Service: Orthopedics;  Laterality: Right;  on eliquis needs general   left total knee replacement Bilateral 1996   TONSILLECTOMY  1937   Patient Active Problem List   Diagnosis Date Noted   Hip fracture (HCC) 05/15/2022   Pancreatic lesion 04/24/2022   Regurgitation of food 04/24/2022   Unsteadiness  04/21/2022   Preop cardiovascular exam 03/21/2022   Incarcerated right inguinal hernia 03/19/2022   Lactic acidosis 03/19/2022   LBBB (left bundle branch block) 03/19/2022   Type 2 diabetes mellitus with hyperglycemia (HCC) 03/19/2022   Constipation 03/19/2022   Obesity (BMI 30-39.9) 03/19/2022   Prolonged QT interval 03/19/2022   Acute metabolic encephalopathy 02/28/2022   Hypokalemia 02/28/2022   Leg swelling 06/22/2020   Atrial flutter (HCC) 05/06/2020   CHF (congestive heart failure) (HCC) 01/02/2015   Chronic diastolic CHF (congestive heart failure) (HCC) 01/02/2015   DM2 (diabetes mellitus, type 2) (HCC) 01/02/2015   Vitamin D deficiency 07/21/2013   Belching 10/22/2012   SOB (shortness of breath) 10/22/2012   Lower extremity numbness 06/20/2012   Fracture of right hip, closed, with routine healing, subsequent encounter 01/18/2012   HYPERSOMNIA UNSPECIFIED 06/23/2008   EDEMA 06/23/2008   GLAUCOMA 05/27/2008   LAD-balloon angioplasty/cutting balloon-2007 05/27/2008   AV BLOCK, COMPLETE 05/27/2008   Pacemaker 05/27/2008   Glaucoma 05/27/2008   HYPERLIPIDEMIA 11/05/2006   Essential hypertension 11/05/2006   REFERRING PROVIDER: Rebecka Apley, NP   REFERRING DIAG: Fracture of unspecified part of neck of left femur, sequela; Unsteadiness on feet   THERAPY DIAG:  Muscle weakness (generalized)  Unsteadiness on feet  Rationale for Evaluation and Treatment: Rehabilitation  ONSET DATE: 05/15/22  SUBJECTIVE:   SUBJECTIVE STATEMENT: Her daughter was told that her hip is looking good when she saw her orthopedic surgeon yesterday. She was able to stand for her x-rays. Her daughter notes that she is still afraid of falling, but she is able to do more walking around her house.   PERTINENT HISTORY: HTN, CHF, DM, glaucoma, depression, hernia, pacemaker, and cognitive deficits  PAIN:  Are you having pain? No  PRECAUTIONS: Fall  RED FLAGS: None   WEIGHT BEARING  RESTRICTIONS: No  FALLS:  Has patient fallen in last 6 months? Yes. Number of falls 2  LIVING ENVIRONMENT: Lives with: lives with their family Lives in: House/apartment Stairs: Yes: External: 2 steps; none Has following equipment at home: Environmental consultant - 2 wheeled and Wheelchair (manual)  OCCUPATION: retired  PLOF: Independent  PATIENT GOALS: be able to walk better, be able to get into and out of her bathroom independently  NEXT MD VISIT: 10/18/22  OBJECTIVE:   DIAGNOSTIC FINDINGS: 07/10/22 right hip x-ray  Impression healing fracture intertrochanteric region right hip with intact hardware without complication   COGNITION: Overall cognitive status: Impaired     SENSATION: WFL  LOWER EXTREMITY ROM: unable to assess due to inability to achieve proper testing position  LOWER EXTREMITY MMT:  MMT Right eval Left eval  Hip flexion 3/5 3/5  Hip extension    Hip abduction 4-/5; assessed in sitting 4/5; assessed in sitting  Hip adduction 4/5 4/5  Hip internal rotation    Hip external rotation    Knee flexion    Knee extension 3/5 4-/5  Ankle dorsiflexion 3/5 3/5  Ankle plantarflexion    Ankle inversion    Ankle eversion     (Blank rows = not tested)  FUNCTIONAL TESTS:  Sit to stand transfer: minA for safety with minimal verbal cueing Stand to sit transfer: SBA for safety with verbal cueing to back up to her seat prior to sitting   GAIT: Assistive device utilized: Environmental consultant - 2 wheeled and Wheelchair (manual) Level of assistance: SBA Comments: Dan Humphreys was utilized to chair to table transfer: she exhibited scissoring pattern as she repeatedly stepped on right foot with her left lower extremity Wheelchair was utilized for longer distances   TODAY'S TREATMENT:                                                                                                                              DATE:                                     08/10/22 EXERCISE LOG  Exercise Repetitions and  Resistance Comments  LAQ 2# 20 reps each    Seated marching  2# 20 reps each    Seated hip ADD isometric  30 reps w/ 5 second hold   Seated clams  Red t-band x 20 reps  Nustep  L2 x 10 minutes   Sit to stand  5 reps with BUE support   Rocker board (seated, lateral)  2 minutes For ankle inversion and eversion  Walking 30 ft x 2; 79 ft With cueing for improved left toe clearance   Blank cell = exercise not performed today                                    08/03/22 EXERCISE LOG  Exercise Repetitions and Resistance Comments  Nustep  L1 x 10 minutes   Rocker board  2 x 25 reps   LAQ 3 x 10 reps each    Seated toe taps 15 reps each    Standing toe taps 20 reps each    Seated ball kicks  20 reps each    Seated hip ABD  10 reps each   Walking 26 feet and 66 feet  With rolling walker; cueing for improved left foot clearance for safety    Blank cell = exercise not performed today                                    PATIENT EDUCATION:  Education details: POC, healing, prognosis, safety, HEP, and goals for therapy Person educated: Patient and Child(ren) Education method: Explanation Education comprehension: verbalized understanding  HOME EXERCISE PROGRAM: Today's interventions were added to her HEP. Her daughter reported understanding  ASSESSMENT:  CLINICAL IMPRESSION: Pt arrives for today's treatment session denying any pain.  Pt able to tolerate increased time on Nustep today with min cues to keep going, but no discomfort.  Pt able to tolerate seated exercises with two pound weights today with encouragement for completion of reps.  Pt able to increased ambulation today with cues for foot clearance and encouragement for increased distance.  Pt denied any pain at completion of today's treatment session.  OBJECTIVE IMPAIRMENTS: Abnormal gait, decreased activity tolerance, decreased balance, decreased cognition, decreased endurance, decreased mobility, difficulty walking, decreased  strength, and postural dysfunction.   ACTIVITY LIMITATIONS: bending, standing, squatting, stairs, transfers, dressing, and locomotion level  PARTICIPATION LIMITATIONS: meal prep, cleaning, shopping, and yard work  PERSONAL FACTORS: Age, Past/current experiences, Time since onset of injury/illness/exacerbation, Transportation, and 3+ comorbidities: HTN, CHF, DM, glaucoma, depression, hernia, pacemaker, and cognitive deficits   are also affecting patient's functional outcome.   REHAB POTENTIAL: Fair    CLINICAL DECISION MAKING: Evolving/moderate complexity  EVALUATION COMPLEXITY: Moderate   GOALS: Goals reviewed with patient? Yes  LONG TERM GOALS: Target date: 08/23/22  Patient will be able to complete her HEP with assistance. Baseline:  Goal status: INITIAL  2.  Patient will be able to independently transfer from sitting to standing for improved independence.  Baseline:  Goal status: INITIAL  3.  Patient will be able to demonstrate improved foot clearance with ambulation for improved safety stepping over items in the floor.  Baseline:  Goal status: INITIAL  4.  Patient will be able to stand for at least 20 minutes for improved function washing dishes.  Baseline: 10 minutes currently Goal status: INITIAL  5.  Patient will improve her right quadriceps strength to at least 4-/5 for improved function standing. Baseline:  Goal status: INITIAL  PLAN:  PT FREQUENCY: 2x/week  PT DURATION: 4 weeks  PLANNED INTERVENTIONS: Therapeutic exercises, Therapeutic activity, Neuromuscular re-education, Balance training, Gait training,  Patient/Family education, Self Care, Joint mobilization, Stair training, Cryotherapy, Moist heat, Manual therapy, and Re-evaluation  PLAN FOR NEXT SESSION: Nustep, lower extremity strengthening, and balance interventions   Newman Pies, PTA 08/10/2022, 11:30 AM

## 2022-08-15 ENCOUNTER — Ambulatory Visit: Payer: Medicare Other

## 2022-08-15 DIAGNOSIS — R2681 Unsteadiness on feet: Secondary | ICD-10-CM

## 2022-08-15 DIAGNOSIS — M6281 Muscle weakness (generalized): Secondary | ICD-10-CM

## 2022-08-15 NOTE — Therapy (Signed)
OUTPATIENT PHYSICAL THERAPY LOWER EXTREMITY TREATMENT   Patient Name: Brittany [name] MRN: [id number] DOB:Apr 02, 1927, 89 y.o., female Today's Date: 08/15/2022  END OF SESSION:  PT End of Session - 08/15/22 0947     Visit Number 7    Number of Visits 8    Date for PT Re-Evaluation 10/06/22    PT Start Time 0930    PT Stop Time 1016    PT Time Calculation (min) 46 min    Activity Tolerance Patient tolerated treatment well    Behavior During Therapy [facility] for tasks assessed/performed                 Past Medical History:  Diagnosis Date   Atrial flutter (HCC)    CAD (coronary artery disease)    CHF (congestive heart failure) (HCC)    Complete heart block (HCC)    a. s/p STJ dual chamber PPM   Depression    Glaucoma    Hyperlipidemia    Hypertension    Pacemaker    Type II diabetes mellitus (HCC)    Past Surgical History:  Procedure Laterality Date   BREAST BIOPSY  1991   ELECTROPHYSIOLOGIC STUDY N/A 02/12/2015   cardioversion by Dr Graciela Husbands    EP IMPLANTABLE DEVICE N/A 04/07/2015   a. STJ dual chamber PPM implanted 2006 for complete heart block b. gen change 2017   FEMUR IM NAIL  01/18/2012   Procedure: INTRAMEDULLARY (IM) NAIL FEMORAL;  Surgeon: Senaida Lange, MD;  Location: [facility] OR;  Service: Orthopedics;  Laterality: Left;   INGUINAL HERNIA REPAIR Right 03/22/2022   Procedure: HERNIA REPAIR INGUINAL ADULT;  Surgeon: Lucretia Roers, MD;  Location: [facility] ORS;  Service: General;  Laterality: Right;   INTRAMEDULLARY (IM) NAIL INTERTROCHANTERIC Right 05/16/2022   Procedure: INTRAMEDULLARY (IM) NAIL INTERTROCHANTERIC;  Surgeon: Vickki Hearing, MD;  Location: [facility] OR[facility];  Service: Orthopedics;  Laterality: Right;  on eliquis needs general   left total knee replacement Bilateral 1996   TONSILLECTOMY  1937   Patient Active Problem List   Diagnosis Date Noted   Hip fracture (HCC) 05/15/2022   Pancreatic lesion 04/24/2022   Regurgitation of food 04/24/2022   Unsteadiness  04/21/2022   Preop cardiovascular exam 03/21/2022   Incarcerated right inguinal hernia 03/19/2022   Lactic acidosis 03/19/2022   LBBB (left bundle branch block) 03/19/2022   Type 2 diabetes mellitus with hyperglycemia (HCC) 03/19/2022   Constipation 03/19/2022   Obesity (BMI 30-39.9) 03/19/2022   Prolonged QT interval 03/19/2022   Acute metabolic encephalopathy 02/28/2022   Hypokalemia 02/28/2022   Leg swelling 06/22/2020   Atrial flutter (HCC) 05/06/2020   CHF (congestive heart failure) (HCC) 01/02/2015   Chronic diastolic CHF (congestive heart failure) (HCC) 01/02/2015   DM2 (diabetes mellitus, type 2) (HCC) 01/02/2015   Vitamin D deficiency 07/21/2013   Belching 10/22/2012   SOB (shortness of breath) 10/22/2012   Lower extremity numbness 06/20/2012   Fracture of right hip, closed, with routine healing, subsequent encounter 01/18/2012   HYPERSOMNIA UNSPECIFIED 06/23/2008   EDEMA 06/23/2008   GLAUCOMA 05/27/2008   LAD-balloon angioplasty/cutting balloon-2007 05/27/2008   AV BLOCK, COMPLETE 05/27/2008   Pacemaker 05/27/2008   Glaucoma 05/27/2008   HYPERLIPIDEMIA 11/05/2006   Essential hypertension 11/05/2006   REFERRING PROVIDER: Rebecka Apley, NP   REFERRING DIAG: Fracture of unspecified part of neck of left femur, sequela; Unsteadiness on feet   THERAPY DIAG:  Muscle weakness (generalized)  Unsteadiness on feet  Rationale for Evaluation and Treatment: Rehabilitation  ONSET DATE: 05/15/22  SUBJECTIVE:   SUBJECTIVE STATEMENT: Patient's daughter notes that she is walking more now. However, she is able to get able to get up into her chair on her own without supervision.   PERTINENT HISTORY: HTN, CHF, DM, glaucoma, depression, hernia, pacemaker, and cognitive deficits  PAIN:  Are you having pain? No  PRECAUTIONS: Fall  RED FLAGS: None   WEIGHT BEARING RESTRICTIONS: No  FALLS:  Has patient fallen in last 6 months? Yes. Number of falls 2  LIVING  ENVIRONMENT: Lives with: lives with their family Lives in: House/apartment Stairs: Yes: External: 2 steps; none Has following equipment at home: Environmental consultant - 2 wheeled and Wheelchair (manual)  OCCUPATION: retired  PLOF: Independent  PATIENT GOALS: be able to walk better, be able to get into and out of her bathroom independently  NEXT MD VISIT: 10/18/22  OBJECTIVE:   DIAGNOSTIC FINDINGS: 07/10/22 right hip x-ray  Impression healing fracture intertrochanteric region right hip with intact hardware without complication   COGNITION: Overall cognitive status: Impaired     SENSATION: WFL  LOWER EXTREMITY ROM: unable to assess due to inability to achieve proper testing position  LOWER EXTREMITY MMT:  MMT Right eval Left eval  Hip flexion 3/5 3/5  Hip extension    Hip abduction 4-/5; assessed in sitting 4/5; assessed in sitting  Hip adduction 4/5 4/5  Hip internal rotation    Hip external rotation    Knee flexion    Knee extension 3/5 4-/5  Ankle dorsiflexion 3/5 3/5  Ankle plantarflexion    Ankle inversion    Ankle eversion     (Blank rows = not tested)  FUNCTIONAL TESTS:  Sit to stand transfer: minA for safety with minimal verbal cueing Stand to sit transfer: SBA for safety with verbal cueing to back up to her seat prior to sitting   GAIT: Assistive device utilized: Environmental consultant - 2 wheeled and Wheelchair (manual) Level of assistance: SBA Comments: Dan Humphreys was utilized to chair to table transfer: she exhibited scissoring pattern as she repeatedly stepped on right foot with her left lower extremity Wheelchair was utilized for longer distances   TODAY'S TREATMENT:                                                                                                                              DATE:                                     08/15/22 EXERCISE LOG  Exercise Repetitions and Resistance Comments  Nustep  L2 x 15 minutes    LAQ 2# x 22 reps each    Seated march  2# x 20 reps  each    Seated clams  30 reps  With therapist resistance  Rocker board (seated, lateral)  2 minutes   Sit to stand  9 reps  BUE support  Standing marching  20 reps each  With walker  Walking  105 feet  With rolling walker   Blank cell = exercise not performed today                                    08/10/22 EXERCISE LOG  Exercise Repetitions and Resistance Comments  LAQ 2# 20 reps each    Seated marching  2# 20 reps each    Seated hip ADD isometric  30 reps w/ 5 second hold   Seated clams  Red t-band x 20 reps    Nustep  L2 x 10 minutes   Sit to stand  5 reps with BUE support   Rocker board (seated, lateral)  2 minutes For ankle inversion and eversion  Walking 30 ft x 2; 79 ft With cueing for improved left toe clearance   Blank cell = exercise not performed today                                    08/03/22 EXERCISE LOG  Exercise Repetitions and Resistance Comments  Nustep  L1 x 10 minutes   Rocker board  2 x 25 reps   LAQ 3 x 10 reps each    Seated toe taps 15 reps each    Standing toe taps 20 reps each    Seated ball kicks  20 reps each    Seated hip ABD  10 reps each   Walking 26 feet and 66 feet  With rolling walker; cueing for improved left foot clearance for safety    Blank cell = exercise not performed today                                    PATIENT EDUCATION:  Education details: POC, healing, prognosis, safety, HEP, and goals for therapy Person educated: Patient and Child(ren) Education method: Explanation Education comprehension: verbalized understanding  HOME EXERCISE PROGRAM: Today's interventions were added to her HEP. Her daughter reported understanding  ASSESSMENT:  CLINICAL IMPRESSION: Patient was progressed with familiar interventions for improved functional mobility. She required minimal cueing with ambulation for improved foot clearance for improved safety with household mobility. She reported feeling tired upon the conclusion of treatment. She  continues to require skilled physical therapy to address her remaining impairments to maximize her safety and functional mobility.   OBJECTIVE IMPAIRMENTS: Abnormal gait, decreased activity tolerance, decreased balance, decreased cognition, decreased endurance, decreased mobility, difficulty walking, decreased strength, and postural dysfunction.   ACTIVITY LIMITATIONS: bending, standing, squatting, stairs, transfers, dressing, and locomotion level  PARTICIPATION LIMITATIONS: meal prep, cleaning, shopping, and yard work  PERSONAL FACTORS: Age, Past/current experiences, Time since onset of injury/illness/exacerbation, Transportation, and 3+ comorbidities: HTN, CHF, DM, glaucoma, depression, hernia, pacemaker, and cognitive deficits   are also affecting patient's functional outcome.   REHAB POTENTIAL: Fair    CLINICAL DECISION MAKING: Evolving/moderate complexity  EVALUATION COMPLEXITY: Moderate   GOALS: Goals reviewed with patient? Yes  LONG TERM GOALS: Target date: 08/23/22  Patient will be able to complete her HEP with assistance. Baseline:  Goal status: INITIAL  2.  Patient will be able to independently transfer from sitting to standing for improved independence.  Baseline:  Goal status: INITIAL  3.  Patient will be able to  demonstrate improved foot clearance with ambulation for improved safety stepping over items in the floor.  Baseline:  Goal status: INITIAL  4.  Patient will be able to stand for at least 20 minutes for improved function washing dishes.  Baseline: 10 minutes currently Goal status: INITIAL  5.  Patient will improve her right quadriceps strength to at least 4-/5 for improved function standing. Baseline:  Goal status: INITIAL  PLAN:  PT FREQUENCY: 2x/week  PT DURATION: 4 weeks  PLANNED INTERVENTIONS: Therapeutic exercises, Therapeutic activity, Neuromuscular re-education, Balance training, Gait training, Patient/Family education, Self Care, Joint  mobilization, Stair training, Cryotherapy, Moist heat, Manual therapy, and Re-evaluation  PLAN FOR NEXT SESSION: Nustep, lower extremity strengthening, and balance interventions   Granville Lewis, PT 08/15/2022, 11:34 AM

## 2022-08-17 ENCOUNTER — Ambulatory Visit: Payer: Medicare Other

## 2022-08-17 DIAGNOSIS — M6281 Muscle weakness (generalized): Secondary | ICD-10-CM

## 2022-08-17 DIAGNOSIS — R2681 Unsteadiness on feet: Secondary | ICD-10-CM

## 2022-08-17 NOTE — Therapy (Signed)
OUTPATIENT PHYSICAL THERAPY LOWER EXTREMITY TREATMENT   Patient Name: Brittany [name] MRN: [id number] DOB:[birth year], 89 y.o., female Today's Date: 08/17/2022  END OF SESSION:  PT End of Session - 08/17/22 0921     Visit Number 8    Number of Visits 8    Date for PT Re-Evaluation 10/06/22    PT Start Time 0922    PT Stop Time 1015    PT Time Calculation (min) 53 min    Activity Tolerance Patient tolerated treatment well    Behavior During Therapy [facility] for tasks assessed/performed                  Past Medical History:  Diagnosis Date   Atrial flutter (HCC)    CAD (coronary artery disease)    CHF (congestive heart failure) (HCC)    Complete heart block (HCC)    a. s/p STJ dual chamber PPM   Depression    Glaucoma    Hyperlipidemia    Hypertension    Pacemaker    Type II diabetes mellitus (HCC)    Past Surgical History:  Procedure Laterality Date   BREAST BIOPSY  1991   ELECTROPHYSIOLOGIC STUDY N/A 02/12/2015   cardioversion by Dr Graciela Husbands    EP IMPLANTABLE DEVICE N/A 04/07/2015   a. STJ dual chamber PPM implanted 2006 for complete heart block b. gen change 2017   FEMUR IM NAIL  01/18/2012   Procedure: INTRAMEDULLARY (IM) NAIL FEMORAL;  Surgeon: Senaida Lange, MD;  Location: [facility] OR;  Service: Orthopedics;  Laterality: Left;   INGUINAL HERNIA REPAIR Right 03/22/2022   Procedure: HERNIA REPAIR INGUINAL ADULT;  Surgeon: Lucretia Roers, MD;  Location: [facility];  Service: General;  Laterality: Right;   INTRAMEDULLARY (IM) NAIL INTERTROCHANTERIC Right 05/16/2022   Procedure: INTRAMEDULLARY (IM) NAIL INTERTROCHANTERIC;  Surgeon: Vickki Hearing, MD;  Location: [facility];  Service: Orthopedics;  Laterality: Right;  on eliquis needs general   left total knee replacement Bilateral 1996   TONSILLECTOMY  1937   Patient Active Problem List   Diagnosis Date Noted   Hip fracture (HCC) 05/15/2022   Pancreatic lesion 04/24/2022   Regurgitation of food 04/24/2022    Unsteadiness 04/21/2022   Preop cardiovascular exam 03/21/2022   Incarcerated right inguinal hernia 03/19/2022   Lactic acidosis 03/19/2022   LBBB (left bundle branch block) 03/19/2022   Type 2 diabetes mellitus with hyperglycemia (HCC) 03/19/2022   Constipation 03/19/2022   Obesity (BMI 30-39.9) 03/19/2022   Prolonged QT interval 03/19/2022   Acute metabolic encephalopathy 02/28/2022   Hypokalemia 02/28/2022   Leg swelling 06/22/2020   Atrial flutter (HCC) 05/06/2020   CHF (congestive heart failure) (HCC) 01/02/2015   Chronic diastolic CHF (congestive heart failure) (HCC) 01/02/2015   DM2 (diabetes mellitus, type 2) (HCC) 01/02/2015   Vitamin D deficiency 07/21/2013   Belching 10/22/2012   SOB (shortness of breath) 10/22/2012   Lower extremity numbness 06/20/2012   Fracture of right hip, closed, with routine healing, subsequent encounter 01/18/2012   HYPERSOMNIA UNSPECIFIED 06/23/2008   EDEMA 06/23/2008   GLAUCOMA 05/27/2008   LAD-balloon angioplasty/cutting balloon-2007 05/27/2008   AV BLOCK, COMPLETE 05/27/2008   Pacemaker 05/27/2008   Glaucoma 05/27/2008   HYPERLIPIDEMIA 11/05/2006   Essential hypertension 11/05/2006   REFERRING PROVIDER: Rebecka Apley, NP   REFERRING DIAG: Fracture of unspecified part of neck of left femur, sequela; Unsteadiness on feet   THERAPY DIAG:  Muscle weakness (generalized)  Unsteadiness on feet  Rationale for Evaluation and Treatment:  Rehabilitation  ONSET DATE: 05/15/22  SUBJECTIVE:   SUBJECTIVE STATEMENT: Patient's daughter reports that she is doing good today.   PERTINENT HISTORY: HTN, CHF, DM, glaucoma, depression, hernia, pacemaker, and cognitive deficits  PAIN:  Are you having pain? No  PRECAUTIONS: Fall  RED FLAGS: None   WEIGHT BEARING RESTRICTIONS: No  FALLS:  Has patient fallen in last 6 months? Yes. Number of falls 2  LIVING ENVIRONMENT: Lives with: lives with their family Lives in:  House/apartment Stairs: Yes: External: 2 steps; none Has following equipment at home: Environmental consultant - 2 wheeled and Wheelchair (manual)  OCCUPATION: retired  PLOF: Independent  PATIENT GOALS: be able to walk better, be able to get into and out of her bathroom independently  NEXT MD VISIT: 10/18/22  OBJECTIVE:   DIAGNOSTIC FINDINGS: 07/10/22 right hip x-ray  Impression healing fracture intertrochanteric region right hip with intact hardware without complication   COGNITION: Overall cognitive status: Impaired     SENSATION: WFL  LOWER EXTREMITY ROM: unable to assess due to inability to achieve proper testing position  LOWER EXTREMITY MMT:  MMT Right eval Right 08/17/22 Left eval Left 08/17/22  Hip flexion 3/5 3+/5 3/5 4-/5  Hip extension      Hip abduction 4-/5; assessed in sitting  4/5; assessed in sitting   Hip adduction 4/5  4/5   Hip internal rotation      Hip external rotation      Knee flexion      Knee extension 3/5 4-/5 4-/5 4-/5  Ankle dorsiflexion 3/5  3/5   Ankle plantarflexion      Ankle inversion      Ankle eversion       (Blank rows = not tested)  FUNCTIONAL TESTS:  Sit to stand transfer: minA for safety with minimal verbal cueing Stand to sit transfer: SBA for safety with verbal cueing to back up to her seat prior to sitting   GAIT: Assistive device utilized: Environmental consultant - 2 wheeled and Wheelchair (manual) Level of assistance: SBA Comments: Dan Humphreys was utilized to chair to table transfer: she exhibited scissoring pattern as she repeatedly stepped on right foot with her left lower extremity Wheelchair was utilized for longer distances   TODAY'S TREATMENT:                                                                                                                              DATE:                                     08/17/22 EXERCISE LOG  Exercise Repetitions and Resistance Comments  Nustep  L2 x 17 minutes   Seated hip ABD isometric  30 reps w/ 5 seconds hold   With therapist assistance  Seated hip ADD isometric 3.5 minutes w/ 5 second hold    Seated pull down  Green t-band x 20 reps  With therapist assistance  LAQ Green t-band x 15 reps    Washing hands  For tolerance and stability standing  Walking 47 feet; and 58 feet For improved foot clearance   Blank cell = exercise not performed today                                    08/15/22 EXERCISE LOG  Exercise Repetitions and Resistance Comments  Nustep  L2 x 15 minutes    LAQ 2# x 22 reps each    Seated march  2# x 20 reps each    Seated clams  30 reps  With therapist resistance  Rocker board (seated, lateral)  2 minutes   Sit to stand  9 reps  BUE support   Standing marching  20 reps each  With walker  Walking  105 feet  With rolling walker   Blank cell = exercise not performed today                                    08/10/22 EXERCISE LOG  Exercise Repetitions and Resistance Comments  LAQ 2# 20 reps each    Seated marching  2# 20 reps each    Seated hip ADD isometric  30 reps w/ 5 second hold   Seated clams  Red t-band x 20 reps    Nustep  L2 x 10 minutes   Sit to stand  5 reps with BUE support   Rocker board (seated, lateral)  2 minutes For ankle inversion and eversion  Walking 30 ft x 2; 79 ft With cueing for improved left toe clearance   Blank cell = exercise not performed today                                    PATIENT EDUCATION:  Education details: POC, healing, prognosis, safety, HEP, and goals for therapy Person educated: Patient and Child(ren) Education method: Explanation Education comprehension: verbalized understanding  HOME EXERCISE PROGRAM: Today's interventions were added to her HEP. Her daughter reported understanding  ASSESSMENT:  CLINICAL IMPRESSION: Patient is making good progress with skilled physical therapy as evidenced by her daughters subjective reports, objective measures, functional mobility and progress toward her goals. She was able to demonstrate  improved independence with transfers and lower extremity strength since her initial evaluation on 07/26/22. However, she continues to experience difficulty achieving consistent foot clearance while walking. Her HEP was updated and her daughter was educated on how to perform these interventions at home. She reported understanding. She will be placed on hold at this time to evaluate the effectiveness of her HEP.   OBJECTIVE IMPAIRMENTS: Abnormal gait, decreased activity tolerance, decreased balance, decreased cognition, decreased endurance, decreased mobility, difficulty walking, decreased strength, and postural dysfunction.   ACTIVITY LIMITATIONS: bending, standing, squatting, stairs, transfers, dressing, and locomotion level  PARTICIPATION LIMITATIONS: meal prep, cleaning, shopping, and yard work  PERSONAL FACTORS: Age, Past/current experiences, Time since onset of injury/illness/exacerbation, Transportation, and 3+ comorbidities: HTN, CHF, DM, glaucoma, depression, hernia, pacemaker, and cognitive deficits   are also affecting patient's functional outcome.   REHAB POTENTIAL: Fair    CLINICAL DECISION MAKING: Evolving/moderate complexity  EVALUATION COMPLEXITY: Moderate   GOALS: Goals reviewed with patient? Yes  LONG TERM GOALS: Target date: 08/23/22  Patient will be able to complete her HEP with assistance. Baseline:  Goal status: MET  2.  Patient will be able to independently transfer from sitting to standing for improved independence.  Baseline:  Goal status: MET  3.  Patient will be able to demonstrate improved foot clearance with ambulation for improved safety stepping over items in the floor.  Baseline: improved, but demonstrated intermittent ankle inversion and poor foot clearance Goal status: ON GOING  4.  Patient will be able to stand for at least 20 minutes for improved function washing dishes.  Baseline: 10 minutes currently Goal status: ON GOING  5.  Patient will  improve her right quadriceps strength to at least 4-/5 for improved function standing. Baseline:  Goal status: MET  PLAN:  PT FREQUENCY: 2x/week  PT DURATION: 4 weeks  PLANNED INTERVENTIONS: Therapeutic exercises, Therapeutic activity, Neuromuscular re-education, Balance training, Gait training, Patient/Family education, Self Care, Joint mobilization, Stair training, Cryotherapy, Moist heat, Manual therapy, and Re-evaluation  PLAN FOR NEXT SESSION: Nustep, lower extremity strengthening, and balance interventions   Granville Lewis, PT 08/17/2022, 12:48 PM

## 2022-08-24 NOTE — Progress Notes (Signed)
Remote pacemaker transmission.   

## 2022-08-24 NOTE — Addendum Note (Signed)
Addended by: Geralyn Flash D on: 08/24/2022 11:02 AM   Modules accepted: Level of Service

## 2022-09-07 LAB — CUP PACEART REMOTE DEVICE CHECK
Battery Remaining Longevity: 1 mo
Battery Remaining Percentage: 1 %
Battery Voltage: 2.65 V
Brady Statistic RV Percent Paced: 99 %
Date Time Interrogation Session: 20240829020023
Implantable Lead Connection Status: 753985
Implantable Lead Connection Status: 753985
Implantable Lead Implant Date: 20061229
Implantable Lead Implant Date: 20061229
Implantable Lead Location: 753859
Implantable Lead Location: 753860
Implantable Lead Model: 5076
Implantable Pulse Generator Implant Date: 20170329
Lead Channel Impedance Value: 640 Ohm
Lead Channel Pacing Threshold Amplitude: 1.75 V
Lead Channel Pacing Threshold Pulse Width: 1 ms
Lead Channel Sensing Intrinsic Amplitude: 12 mV
Lead Channel Setting Pacing Amplitude: 4 V
Lead Channel Setting Pacing Pulse Width: 1 ms
Lead Channel Setting Sensing Sensitivity: 6 mV
Pulse Gen Model: 2240
Pulse Gen Serial Number: 7892876

## 2022-09-08 ENCOUNTER — Ambulatory Visit (INDEPENDENT_AMBULATORY_CARE_PROVIDER_SITE_OTHER): Payer: Medicare Other

## 2022-09-08 DIAGNOSIS — I442 Atrioventricular block, complete: Secondary | ICD-10-CM

## 2022-09-12 NOTE — Progress Notes (Signed)
Remote pacemaker transmission.   

## 2022-09-28 ENCOUNTER — Encounter: Payer: Self-pay | Admitting: Gastroenterology

## 2022-10-09 ENCOUNTER — Ambulatory Visit (INDEPENDENT_AMBULATORY_CARE_PROVIDER_SITE_OTHER): Payer: Medicare Other

## 2022-10-09 DIAGNOSIS — I442 Atrioventricular block, complete: Secondary | ICD-10-CM

## 2022-10-09 LAB — CUP PACEART REMOTE DEVICE CHECK
Battery Remaining Longevity: 1 mo
Battery Remaining Percentage: 0.5 %
Battery Voltage: 2.62 V
Brady Statistic RV Percent Paced: 99 %
Date Time Interrogation Session: 20240929040015
Implantable Lead Connection Status: 753985
Implantable Lead Connection Status: 753985
Implantable Lead Implant Date: 20061229
Implantable Lead Implant Date: 20061229
Implantable Lead Location: 753859
Implantable Lead Location: 753860
Implantable Lead Model: 5076
Implantable Pulse Generator Implant Date: 20170329
Lead Channel Impedance Value: 630 Ohm
Lead Channel Pacing Threshold Amplitude: 1.75 V
Lead Channel Pacing Threshold Pulse Width: 1 ms
Lead Channel Sensing Intrinsic Amplitude: 12 mV
Lead Channel Setting Pacing Amplitude: 4 V
Lead Channel Setting Pacing Pulse Width: 1 ms
Lead Channel Setting Sensing Sensitivity: 6 mV
Pulse Gen Model: 2240
Pulse Gen Serial Number: 7892876

## 2022-10-20 ENCOUNTER — Telehealth: Payer: Self-pay

## 2022-10-20 NOTE — Telephone Encounter (Signed)
Attempted to contact patient via phone. Phone would not ring and no VM would register. Will send mychart message.

## 2022-10-20 NOTE — Telephone Encounter (Signed)
Device alert for ERI reached 10/10 Route to triage [place], [facility]

## 2022-10-24 NOTE — Progress Notes (Signed)
Remote pacemaker transmission.   

## 2022-10-26 ENCOUNTER — Telehealth: Payer: Self-pay | Admitting: *Deleted

## 2022-10-26 NOTE — Telephone Encounter (Signed)
Pt received letter stating it was time to schedule her CT. I just wanted to verify that you wanted CT pancreatic protocol. Please advise. Thank you

## 2022-11-01 ENCOUNTER — Other Ambulatory Visit: Payer: Self-pay | Admitting: *Deleted

## 2022-11-01 ENCOUNTER — Encounter: Payer: Self-pay | Admitting: *Deleted

## 2022-11-01 DIAGNOSIS — K869 Disease of pancreas, unspecified: Secondary | ICD-10-CM

## 2022-11-01 NOTE — Telephone Encounter (Signed)
Yes that is correct. Thanks!

## 2022-11-01 NOTE — Telephone Encounter (Signed)
Ct scheduled for 12/06/22, arrive at 8:45 am to check in. MyChart message sent with appt date and time

## 2022-11-06 ENCOUNTER — Encounter: Payer: Self-pay | Admitting: Orthopedic Surgery

## 2022-11-06 ENCOUNTER — Other Ambulatory Visit (INDEPENDENT_AMBULATORY_CARE_PROVIDER_SITE_OTHER): Payer: Medicare Other

## 2022-11-06 ENCOUNTER — Ambulatory Visit (INDEPENDENT_AMBULATORY_CARE_PROVIDER_SITE_OTHER): Payer: Medicare Other | Admitting: Orthopedic Surgery

## 2022-11-06 VITALS — Ht 62.0 in | Wt 141.0 lb

## 2022-11-06 DIAGNOSIS — S72001D Fracture of unspecified part of neck of right femur, subsequent encounter for closed fracture with routine healing: Secondary | ICD-10-CM | POA: Diagnosis not present

## 2022-11-06 NOTE — Progress Notes (Signed)
Follow up   Chief Complaint  Patient presents with   Post-op Follow-up    Right hip IM nail 05/16/22     Encounter Diagnosis  Name Primary?   Fracture of right hip, closed, with routine healing, subsequent encounter 05/16/22 Yes    Doing well post hip fracture   Dementia   Very supportive daughter   Xrays fracture healing   Reportedly waling well   She can be released now

## 2022-11-08 LAB — CUP PACEART REMOTE DEVICE CHECK
Battery Remaining Longevity: 0 mo
Battery Voltage: 2.59 V
Brady Statistic RV Percent Paced: 99 %
Date Time Interrogation Session: 20241030020015
Implantable Lead Connection Status: 753985
Implantable Lead Connection Status: 753985
Implantable Lead Implant Date: 20061229
Implantable Lead Implant Date: 20061229
Implantable Lead Location: 753859
Implantable Lead Location: 753860
Implantable Lead Model: 5076
Implantable Pulse Generator Implant Date: 20170329
Lead Channel Impedance Value: 600 Ohm
Lead Channel Pacing Threshold Amplitude: 1.75 V
Lead Channel Pacing Threshold Pulse Width: 1 ms
Lead Channel Sensing Intrinsic Amplitude: 12 mV
Lead Channel Setting Pacing Amplitude: 4 V
Lead Channel Setting Pacing Pulse Width: 1 ms
Lead Channel Setting Sensing Sensitivity: 6 mV
Pulse Gen Model: 2240
Pulse Gen Serial Number: 7892876

## 2022-11-09 ENCOUNTER — Ambulatory Visit (INDEPENDENT_AMBULATORY_CARE_PROVIDER_SITE_OTHER): Payer: Medicare Other

## 2022-11-09 DIAGNOSIS — I442 Atrioventricular block, complete: Secondary | ICD-10-CM

## 2022-11-17 ENCOUNTER — Other Ambulatory Visit: Payer: Self-pay

## 2022-11-17 ENCOUNTER — Ambulatory Visit: Payer: Medicare Other | Attending: Physician Assistant | Admitting: Physician Assistant

## 2022-11-17 ENCOUNTER — Encounter: Payer: Self-pay | Admitting: Physician Assistant

## 2022-11-17 VITALS — BP 100/40 | HR 76 | Ht 62.0 in | Wt 143.4 lb

## 2022-11-17 DIAGNOSIS — Z45010 Encounter for checking and testing of cardiac pacemaker pulse generator [battery]: Secondary | ICD-10-CM

## 2022-11-17 DIAGNOSIS — I4719 Other supraventricular tachycardia: Secondary | ICD-10-CM

## 2022-11-17 DIAGNOSIS — I442 Atrioventricular block, complete: Secondary | ICD-10-CM

## 2022-11-17 DIAGNOSIS — Z950 Presence of cardiac pacemaker: Secondary | ICD-10-CM

## 2022-11-17 DIAGNOSIS — I4892 Unspecified atrial flutter: Secondary | ICD-10-CM

## 2022-11-17 DIAGNOSIS — I5032 Chronic diastolic (congestive) heart failure: Secondary | ICD-10-CM | POA: Diagnosis not present

## 2022-11-17 DIAGNOSIS — D6869 Other thrombophilia: Secondary | ICD-10-CM

## 2022-11-17 LAB — CUP PACEART INCLINIC DEVICE CHECK
Date Time Interrogation Session: 20241108172302
Implantable Lead Connection Status: 753985
Implantable Lead Connection Status: 753985
Implantable Lead Implant Date: 20061229
Implantable Lead Implant Date: 20061229
Implantable Lead Location: 753859
Implantable Lead Location: 753860
Implantable Lead Model: 5076
Implantable Pulse Generator Implant Date: 20170329
Lead Channel Pacing Threshold Amplitude: 2 V
Lead Channel Pacing Threshold Pulse Width: 1 ms
Pulse Gen Model: 2240
Pulse Gen Serial Number: 7892876

## 2022-11-17 MED ORDER — FUROSEMIDE 40 MG PO TABS: 40.0000 mg | ORAL_TABLET | Freq: Every day | ORAL | 2 refills | Status: DC

## 2022-11-17 NOTE — Progress Notes (Signed)
Cardiology Office Note:  .   Date:  11/17/2022  ID:  Brittany [name], DOB 07-Jan-1928, MRN [id number] PCP: Tracey Harries, MD  [facility] Providers Cardiologist:  None Electrophysiologist:  Sherryl Manges, MD {  History of Present Illness: Marland Kitchen   Brittany [name] is a 89 y.o. female w/PMHx of PPM, HTN, HLD, DM and CAD with last intervention PTCA in 2007, CHB w/PPM, chronic CHF (diastolic)  She saw Dr. Graciela Husbands 02/20/22, BP on the lower side, discussed possible need to stop/change her irbisartan. Device near ERI,  Planned for [employer] as her required lead output is high.  Need for Aegis pouch She was in an Atach/paced w/CHB  Hospitalized March 2024 with an incarcerated hernia  Today's visit is scheduled for device ERI status  ROS:   She is accompanied by her daughter While here she is repetitive in her speech, but answers questions appropriately Her daughter reports this is at base line for her when she is nervouse, though very communicative at home, she does have a poor short term memory especially Requires minimal assistance   No CP, palpitations or cardiac concerns reported outside of her pacer battery No syncope, dizzy spells No bleeding, signs of bleeding  She had hips surgery a few months agoa, wound complicated by infection on doxycycline but completed and cleared infection  Had hernia surgery   Device information Abbott dual chamber PPM implanted 01/06/2005, gen change 04/07/2015  Arrhythmia/AAD hx Dec 2016 found with Aflutter (atypical)   Studies Reviewed: Marland Kitchen    EKG not done today  DEVICE interrogation done today and reviewed by myself Battery reached ERI 100/10/24 Programmed VVI RV lead measurements stable No HVR episodes dependent    Echo 03/20/22  1. Left ventricular ejection fraction, by estimation, is 60 to 65%. The  left ventricle has normal function. The left ventricle has no regional  wall motion abnormalities. There is mild left  ventricular hypertrophy.  Left ventricular diastolic function  could not be evaluated.   2. Right ventricular systolic function is low normal. The right  ventricular size is normal. There is mildly elevated pulmonary artery  systolic pressure. The estimated right ventricular systolic pressure is  41.9 mmHg.   3. Left atrial size was severely dilated.   4. The mitral valve is abnormal. Trivial mitral valve regurgitation. Mild  mitral stenosis. The mean mitral valve gradient is 3.0 mmHg. Severe mitral  annular calcification   5. The aortic valve is tricuspid. Aortic valve regurgitation is mild to  moderate. No aortic stenosis is present.   6. Aortic dilatation noted. There is borderline dilatation of the aortic  root, measuring 37 mm.   7. The inferior vena cava is dilated in size with >50% respiratory  variability, suggesting right atrial pressure of 8 mmHg.     Risk Assessment/Calculations:    Physical Exam:   VS:  There were no vitals taken for this visit.   Wt Readings from Last 3 Encounters:  11/06/22 141 lb (64 kg)  06/12/22 141 lb (64 kg)  05/16/22 141 lb 1.5 oz (64 kg)    GEN: Well nourished, well developed in no acute distress NECK: No JVD; No carotid bruits CARDIAC: RRR, (paced) no murmurs, rubs, gallops RESPIRATORY:  CTA b/l without rales, wheezing or rhonchi  ABDOMEN: Soft, non-tender, non-distended EXTREMITIES:  No edema; No deformity   PPM site: is stable, no thinning, fluctuation, tethering  ASSESSMENT AND PLAN: .    PPM ERI Discussed generator procedure, potential risks/benefits with  the patient and her daughter Will plan to proceed Dr. Graciela Husbands discussed changing to [facility] Scientific generator Device dependent Known high RV threshold  AFlutter (permanent) ATach CHA2DS2Vasc is 4, on eliquis, appropriately dosed for Creat/weight   HTN BP low again this visit Go ahead and stop her irbesartan  Chronic CHF (diastolic) No exam findings of volume OL No  SOB   Secondary hypercoagulable state 2/2 AFib   Dispo: usual post procedure follow up  Signed, Sheilah Pigeon, PA-C

## 2022-11-17 NOTE — Patient Instructions (Signed)
Medication Instructions:  STOP Avapro (irbesartan) *If you need a refill on your cardiac medications before your next appointment, please call your pharmacy*   Lab Work: BMET & CBC 30 days prior to procedure If you have labs (blood work) drawn today and your tests are completely normal, you will receive your results only by: MyChart Message (if you have MyChart) OR A paper copy in the mail If you have any lab test that is abnormal or we need to change your treatment, we will call you to review the results.   Testing/Procedures: Battery Change to [employer] with Dr Graciela Husbands   Follow-Up: At [facility], you and your health needs are our priority.  As part of our continuing mission to provide you with exceptional heart care, we have created designated Provider Care Teams.  These Care Teams include your primary Cardiologist (physician) and Advanced Practice Providers (APPs -  Physician Assistants and Nurse Practitioners) who all work together to provide you with the care you need, when you need it.  We recommend signing up for the patient portal called "MyChart".  Sign up information is provided on this After Visit Summary.  MyChart is used to connect with patients for Virtual Visits (Telemedicine).  Patients are able to view lab/test results, encounter notes, upcoming appointments, etc.  Non-urgent messages can be sent to your provider as well.   To learn more about what you can do with MyChart, go to [email or web address].    Your next appointment:    USUAL FOLLOW UP   Provider:   Sherryl Manges, MD    Other Instructions

## 2022-11-22 NOTE — Progress Notes (Signed)
Remote pacemaker transmission.   

## 2022-12-06 ENCOUNTER — Ambulatory Visit (HOSPITAL_COMMUNITY): Payer: Medicare Other

## 2022-12-06 ENCOUNTER — Telehealth: Payer: Self-pay | Admitting: Internal Medicine

## 2022-12-06 DIAGNOSIS — I4892 Unspecified atrial flutter: Secondary | ICD-10-CM

## 2022-12-06 DIAGNOSIS — I5032 Chronic diastolic (congestive) heart failure: Secondary | ICD-10-CM

## 2022-12-06 DIAGNOSIS — Z45010 Encounter for checking and testing of cardiac pacemaker pulse generator [battery]: Secondary | ICD-10-CM

## 2022-12-06 DIAGNOSIS — Z7901 Long term (current) use of anticoagulants: Secondary | ICD-10-CM

## 2022-12-06 NOTE — Telephone Encounter (Signed)
Patient is needing CBC/BMET for procedure scheduled in December. Lab orders placed and released to [facility].  Reviewed procedure instructions. Patient's daughter [name] verbalized understanding and expressed appreciation for call.

## 2022-12-06 NOTE — Telephone Encounter (Signed)
Daughter states they need lab orders put back in

## 2022-12-08 ENCOUNTER — Other Ambulatory Visit (HOSPITAL_COMMUNITY)
Admission: RE | Admit: 2022-12-08 | Discharge: 2022-12-08 | Disposition: A | Discharge status: Home / Self Care | Payer: Medicare Other | Source: Other Acute Inpatient Hospital | Attending: Internal Medicine | Admitting: Internal Medicine

## 2022-12-08 DIAGNOSIS — Z45010 Encounter for checking and testing of cardiac pacemaker pulse generator [battery]: Secondary | ICD-10-CM | POA: Insufficient documentation

## 2022-12-08 DIAGNOSIS — Z7901 Long term (current) use of anticoagulants: Secondary | ICD-10-CM | POA: Diagnosis present

## 2022-12-08 LAB — CBC
HCT: 36.2 % (ref 36.0–46.0)
Hemoglobin: 11.7 g/dL — ABNORMAL LOW (ref 12.0–15.0)
MCH: 30.5 pg (ref 26.0–34.0)
MCHC: 32.3 g/dL (ref 30.0–36.0)
MCV: 94.5 fL (ref 80.0–100.0)
Platelets: 201 10*3/uL (ref 150–400)
RBC: 3.83 MIL/uL — ABNORMAL LOW (ref 3.87–5.11)
RDW: 14.6 % (ref 11.5–15.5)
WBC: 5.8 10*3/uL (ref 4.0–10.5)
nRBC: 0 % (ref 0.0–0.2)

## 2022-12-08 LAB — BASIC METABOLIC PANEL
Anion gap: 8 (ref 5–15)
BUN: 16 mg/dL (ref 8–23)
CO2: 28 mmol/L (ref 22–32)
Calcium: 10.6 mg/dL — ABNORMAL HIGH (ref 8.9–10.3)
Chloride: 105 mmol/L (ref 98–111)
Creatinine, Ser: 0.72 mg/dL (ref 0.44–1.00)
GFR, Estimated: >60 mL/min (ref >60)
Glucose, Bld: 146 mg/dL — ABNORMAL HIGH (ref 70–99)
Potassium: 3.7 mmol/L (ref 3.5–5.1)
Sodium: 141 mmol/L (ref 135–145)

## 2022-12-10 LAB — CUP PACEART REMOTE DEVICE CHECK
Battery Remaining Longevity: 0 mo
Battery Voltage: 2.57 V
Brady Statistic RV Percent Paced: 99 %
Date Time Interrogation Session: 20241130020013
Implantable Lead Connection Status: 753985
Implantable Lead Connection Status: 753985
Implantable Lead Implant Date: 20061229
Implantable Lead Implant Date: 20061229
Implantable Lead Location: 753859
Implantable Lead Location: 753860
Implantable Lead Model: 5076
Implantable Pulse Generator Implant Date: 20170329
Lead Channel Impedance Value: 590 Ohm
Lead Channel Pacing Threshold Amplitude: 2 V
Lead Channel Pacing Threshold Pulse Width: 1 ms
Lead Channel Sensing Intrinsic Amplitude: 12 mV
Lead Channel Setting Pacing Amplitude: 4 V
Lead Channel Setting Pacing Pulse Width: 1 ms
Lead Channel Setting Sensing Sensitivity: 6 mV
Pulse Gen Model: 2240
Pulse Gen Serial Number: 7892876

## 2022-12-11 ENCOUNTER — Ambulatory Visit (INDEPENDENT_AMBULATORY_CARE_PROVIDER_SITE_OTHER): Payer: Medicare Other

## 2022-12-11 DIAGNOSIS — I442 Atrioventricular block, complete: Secondary | ICD-10-CM

## 2022-12-13 ENCOUNTER — Ambulatory Visit (HOSPITAL_COMMUNITY)
Admission: RE | Admit: 2022-12-13 | Discharge: 2022-12-13 | Disposition: A | Discharge status: Home / Self Care | Payer: Medicare Other | Source: Ambulatory Visit | Attending: Gastroenterology | Admitting: Gastroenterology

## 2022-12-13 DIAGNOSIS — K869 Disease of pancreas, unspecified: Secondary | ICD-10-CM | POA: Insufficient documentation

## 2022-12-13 MED ORDER — IOHEXOL 300 MG/ML  SOLN
100.0000 mL | Freq: Once | INTRAMUSCULAR | Status: AC | PRN
  Administered 2022-12-13: 100 mL via INTRAVENOUS

## 2022-12-15 NOTE — Pre-Procedure Instructions (Signed)
Instructed patient on the following items: Arrival time 1130 Nothing to eat or drink after midnight No meds AM of procedure Responsible person to drive you home and stay with you for 24 hrs Wash with special soap night before and morning of procedure If on anti-coagulant drug instructions Eliquis- last dose 12/6

## 2022-12-18 ENCOUNTER — Encounter (HOSPITAL_COMMUNITY)
Admission: RE | Disposition: A | Discharge status: Home / Self Care | Payer: Self-pay | Source: Home / Self Care | Attending: Internal Medicine

## 2022-12-18 ENCOUNTER — Other Ambulatory Visit: Payer: Self-pay

## 2022-12-18 ENCOUNTER — Ambulatory Visit (HOSPITAL_COMMUNITY)
Admission: RE | Admit: 2022-12-18 | Discharge: 2022-12-18 | Disposition: A | Discharge status: Home / Self Care | Payer: Medicare Other | Attending: Internal Medicine | Admitting: Internal Medicine

## 2022-12-18 DIAGNOSIS — Z87891 Personal history of nicotine dependence: Secondary | ICD-10-CM | POA: Insufficient documentation

## 2022-12-18 DIAGNOSIS — I5030 Unspecified diastolic (congestive) heart failure: Secondary | ICD-10-CM | POA: Diagnosis not present

## 2022-12-18 DIAGNOSIS — I110 Hypertensive heart disease with heart failure: Secondary | ICD-10-CM | POA: Insufficient documentation

## 2022-12-18 DIAGNOSIS — Z7901 Long term (current) use of anticoagulants: Secondary | ICD-10-CM | POA: Insufficient documentation

## 2022-12-18 DIAGNOSIS — I442 Atrioventricular block, complete: Secondary | ICD-10-CM | POA: Diagnosis not present

## 2022-12-18 DIAGNOSIS — I4892 Unspecified atrial flutter: Secondary | ICD-10-CM | POA: Insufficient documentation

## 2022-12-18 DIAGNOSIS — R000 Tachycardia, unspecified: Secondary | ICD-10-CM | POA: Insufficient documentation

## 2022-12-18 DIAGNOSIS — Z45010 Encounter for checking and testing of cardiac pacemaker pulse generator [battery]: Secondary | ICD-10-CM | POA: Insufficient documentation

## 2022-12-18 DIAGNOSIS — Z8249 Family history of ischemic heart disease and other diseases of the circulatory system: Secondary | ICD-10-CM | POA: Insufficient documentation

## 2022-12-18 HISTORY — PX: PPM GENERATOR CHANGEOUT: EP1233

## 2022-12-18 LAB — GLUCOSE, CAPILLARY
Glucose-Capillary: 103 mg/dL — ABNORMAL HIGH (ref 70–99)
Glucose-Capillary: 136 mg/dL — ABNORMAL HIGH (ref 70–99)

## 2022-12-18 SURGERY — PPM GENERATOR CHANGEOUT
Anesthesia: LOCAL

## 2022-12-18 MED ORDER — LIDOCAINE HCL (PF) 1 % IJ SOLN
INTRAMUSCULAR | Status: DC | PRN
  Administered 2022-12-18: 50 mL

## 2022-12-18 MED ORDER — CHLORHEXIDINE GLUCONATE 4 % EX SOLN: 4.0000 | Freq: Once | CUTANEOUS | Status: DC

## 2022-12-18 MED ORDER — SODIUM CHLORIDE 0.9 % IV SOLN
80.0000 mg | INTRAVENOUS | Status: DC
  Filled 2022-12-18: qty 2

## 2022-12-18 MED ORDER — SODIUM CHLORIDE 0.9% FLUSH: 10.0000 mL | Freq: Two times a day (BID) | INTRAVENOUS | Status: DC

## 2022-12-18 MED ORDER — POVIDONE-IODINE 10 % EX SWAB
2.0000 | Freq: Once | CUTANEOUS | Status: AC
Start: 2022-12-18 — End: 2022-12-18
  Administered 2022-12-18: 2 via TOPICAL

## 2022-12-18 MED ORDER — VANCOMYCIN HCL IN DEXTROSE 1-5 GM/200ML-% IV SOLN
1000.0000 mg | INTRAVENOUS | Status: AC
Start: 2022-12-18 — End: 2022-12-18

## 2022-12-18 MED ORDER — LIDOCAINE HCL 1 % IJ SOLN
INTRAMUSCULAR | Status: AC
  Filled 2022-12-18: qty 60

## 2022-12-18 MED ORDER — ACETAMINOPHEN 325 MG PO TABS: 325.0000 mg | ORAL_TABLET | ORAL | Status: DC | PRN

## 2022-12-18 MED ORDER — VANCOMYCIN HCL IN DEXTROSE 1-5 GM/200ML-% IV SOLN
INTRAVENOUS | Status: AC
  Administered 2022-12-18: 1000 mg via INTRAVENOUS
  Filled 2022-12-18: qty 200

## 2022-12-18 MED ORDER — SODIUM CHLORIDE 0.9 % IV SOLN: INTRAVENOUS | Status: DC

## 2022-12-18 MED ORDER — CEFAZOLIN SODIUM-DEXTROSE 2-4 GM/100ML-% IV SOLN
INTRAVENOUS | Status: AC
  Filled 2022-12-18: qty 100

## 2022-12-18 SURGICAL SUPPLY — 5 items
CABLE SURGICAL S-101-97-12 (CABLE) ×1 IMPLANT
HEMOSTAT SURGICEL 2X4 FIBR (HEMOSTASIS) IMPLANT
PACEMAKER ASSURITY DR-RF (Pacemaker) IMPLANT
PAD DEFIB RADIO PHYSIO CONN (PAD) ×1 IMPLANT
TRAY PACEMAKER INSERTION (PACKS) ×1 IMPLANT

## 2022-12-18 NOTE — Discharge Instructions (Addendum)

## 2022-12-18 NOTE — H&P (Signed)
Patient Care Team: Brittany Harries, Brittany [name] as PCP - General (Family Medicine) Brittany Salvia, Brittany [name] as PCP - Electrophysiology (Cardiology) Brittany Salvia, Brittany [name] (Cardiology)   HPI  Brittany [name] is a 89 y.o. female admitted for generator replacement of pacemaker remotely implanted for complete heart block , gen replacement 2017 and now at [facility]    She has a history of prior LAD POBA with normal left ventricular function.    Hx of atrial flutter, on anticoagulation (last dose 12/6   The patient denies chest pain, shortness of breath, nocturnal dyspnea, orthopnea or peripheral edema.  There have been no palpitations, lightheadedness or syncope. .    Date Cr K Hgb LDL  11/19 0.71 3.9 11.4 83 (5/18)   12/20 (CE)  0.69 4.2 12.5     3/22 (CE)  0.72 3.8 11.4    1/24 0.82 4.2 11.7    12/24 0.72 3.7 11.7        Records and Results Reviewed   Past Medical History:  Diagnosis Date   Atrial flutter (HCC)    CAD (coronary artery disease)    CHF (congestive heart failure) (HCC)    Complete heart block (HCC)    a. s/p STJ dual chamber PPM   Depression    Glaucoma    Hyperlipidemia    Hypertension    Pacemaker    Type II diabetes mellitus (HCC)     Past Surgical History:  Procedure Laterality Date   BREAST BIOPSY  1991   ELECTROPHYSIOLOGIC STUDY N/A 02/12/2015   cardioversion by Dr Graciela Husbands    EP IMPLANTABLE DEVICE N/A 04/07/2015   a. STJ dual chamber PPM implanted 2006 for complete heart block b. gen change 2017   FEMUR IM NAIL  01/18/2012   Procedure: INTRAMEDULLARY (IM) NAIL FEMORAL;  Surgeon: Senaida Lange, Brittany [name];  Location: [facility] OR;  Service: Orthopedics;  Laterality: Left;   INGUINAL HERNIA REPAIR Right 03/22/2022   Procedure: HERNIA REPAIR INGUINAL ADULT;  Surgeon: Lucretia Roers, Brittany [name];  Location: [facility] ORS;  Service: General;  Laterality: Right;   INTRAMEDULLARY (IM) NAIL INTERTROCHANTERIC Right 05/16/2022   Procedure: INTRAMEDULLARY (IM) NAIL INTERTROCHANTERIC;  Surgeon:  Vickki Hearing, Brittany [name];  Location: [facility] ORS;  Service: Orthopedics;  Laterality: Right;  on eliquis needs general   left total knee replacement Bilateral 1996   TONSILLECTOMY  1937    Current Facility-Administered Medications  Medication Dose Route Frequency Provider Last Rate Last Admin   chlorhexidine (HIBICLENS) 4 % liquid 4 Application  4 Application Topical Once Brittany Salvia, Brittany [name]       gentamicin (GARAMYCIN) 80 mg in sodium chloride 0.9 % 500 mL irrigation  80 mg Irrigation On Call Brittany Salvia, Brittany [name]       sodium chloride flush (NS) 0.9 % injection 10 mL  10 mL Intravenous Q12H Brittany Salvia, Brittany [name]       vancomycin (VANCOCIN) IVPB 1000 mg/200 mL premix  1,000 mg Intravenous On Call Brittany Salvia, Brittany [name]        Allergies  Allergen Reactions   Ciprofloxacin Other (See Comments)    Severe chest pains   Ace Inhibitors Other (See Comments)    REACTION: cough and sore throat   Cedax [Ceftibuten] Other (See Comments)    Possibly nausea/vomiting, cyanosis, severe headaches   Lipitor [Atorvastatin] Other (See Comments)    REACTION: Myalgia   Plavix [Clopidogrel Bisulfate] Other (See Comments)    Possibly nausea/vomiting, cyanosis, severe headaches  Statins Other (See Comments)    Myalgias, cyanosis      Social History   Tobacco Use   Smoking status: Former    Current packs/day: 0.00    Types: Cigarettes    Quit date: 01/17/1951    Years since quitting: 71.9   Smokeless tobacco: Never  Vaping Use   Vaping status: Never Used  Substance Use Topics   Alcohol use: Not Currently    Comment: Occasional glass of wine   Drug use: No     Family History  Problem Relation Age of Onset   Diabetes Mother    Heart attack Father    Stroke Father    Hypertension Father    Kidney disease Daughter      Current Meds  Medication Sig   Cholecalciferol (VITAMIN D3) 50 MCG (2000 UT) TABS Take 2,000 Units by mouth daily.   cyanocobalamin 1000 MCG tablet Take 1,000 mcg by mouth daily.    docusate sodium (COLACE) 100 MG capsule Take 100 mg by mouth 2 (two) times daily.   ELIQUIS 5 MG TABS tablet TAKE 1 TABLET TWICE A DAY   famotidine (PEPCID) 20 MG tablet Take 1 tablet (20 mg total) by mouth daily.   ferrous sulfate 325 (65 FE) MG tablet Take 325 mg by mouth daily with breakfast.   furosemide (LASIX) 40 MG tablet Take 1 tablet (40 mg total) by mouth daily.   isosorbide mononitrate (IMDUR) 30 MG 24 hr tablet Take 30 mg by mouth daily.   Multiple Vitamins-Minerals (MULTIVITAMIN WOMEN 50+) TABS Take 1 tablet by mouth daily.   Polyethyl Glyc-Propyl Glyc PF (SYSTANE HYDRATION PF) 0.4-0.3 % SOLN Place 1 drop into both eyes daily as needed (dry eyes).   potassium chloride SA (KLOR-CON M) 20 MEQ tablet Take 20 mEq by mouth daily.     Review of Systems negative except from HPI and PMH  Physical Exam BP (!) 128/58   Pulse 63   Temp 97.7 F (36.5 C) (Oral)   Resp 17   Ht 5' (1.524 m)   Wt 62.1 kg   SpO2 96%   BMI 26.76 kg/m  Well developed and well nourished in no acute distress HENT normal E scleral and icterus clear Neck Supple JVP flat; carotids brisk and full Clear to ausculation Regular rate and rhythm, no murmurs gallops or rub Soft with active bowel sounds No clubbing cyanosis  Edema Alert  grossly normal motor and sensory function Skin Warm and Dry    Assessment and  Plan  Complete heart block   Pacemaker-St. Jude    Hypertension     HFpEF   Coronary disease with prior LAD POBA     Atrial flutter/tachycardia    We have reviewed the benefits and risks of generator replacement.  These include but are not limited to lead fracture and infection.  The patient understands, agrees and is willing to proceed.      Will decrease Apixaban  to 2.5 bid

## 2022-12-19 ENCOUNTER — Encounter (HOSPITAL_COMMUNITY): Payer: Self-pay | Admitting: Internal Medicine

## 2022-12-19 ENCOUNTER — Other Ambulatory Visit: Payer: Self-pay | Admitting: Internal Medicine

## 2022-12-19 ENCOUNTER — Other Ambulatory Visit: Payer: Self-pay | Admitting: Gastroenterology

## 2022-12-19 MED ORDER — APIXABAN 2.5 MG PO TABS: 2.5000 mg | ORAL_TABLET | Freq: Two times a day (BID) | ORAL | 1 refills | Status: DC

## 2022-12-19 MED FILL — Cefazolin Sodium-Dextrose IV Solution 2 GM/100ML-4%: INTRAVENOUS | Qty: 100 | Status: AC

## 2022-12-19 NOTE — Telephone Encounter (Signed)
Eliquis 5mg  refill request received. Patient is 89 years old, weight-62.1kg, Crea-0.72 on 12/08/22, Diagnosis-Aflutter/Afib, and last seen by Francis Dowse on 11/17/22. Dose is appropriate based on dosing criteria.   Spoke with pt's dtr and she states she was told that the pt should have dose reduced per Dr. Graciela Husbands on yesterday while at the Hospital. Advised I would send to Dr. Graciela Husbands and nurse to clarify. The pt is 89 yrs old, wt-62.1kg (137lbs)on 12/18/22, Crea-0.72 on 12/08/22 the pt does not meet two dosing factors to met dose reduction but will send to Cardiologist.

## 2022-12-19 NOTE — Telephone Encounter (Signed)
Spoke with pt's daughter, DPR and advised per Dr Graciela Husbands pt's Eliquis dose should now be 2.5mg  - 1 tablet by mouth twice daily.  Pt's daughter verbalizes understanding and thanked Charity fundraiser for the call.

## 2022-12-19 NOTE — Telephone Encounter (Signed)
Pt c/o medication issue:  1. Name of Medication: Eliquis  2. How are you currently taking this medication (dosage and times per day)?   3. Are you having a reaction (difficulty breathing--STAT)?   4. What is your medication issue? Daughter wants to know what milligram is patient supposed to be taking? She said at the hospital she was told 2.5 mg- whichever one is correct please send into the pharmacy

## 2022-12-19 NOTE — Telephone Encounter (Signed)
Pt is requesting Eliquis. Daughter is requesting a call regarding a dosage question

## 2022-12-20 ENCOUNTER — Telehealth: Payer: Self-pay

## 2022-12-20 NOTE — Telephone Encounter (Signed)
Follow-up after same day discharge: Implant date: 12/18/2022 MD: Sherryl Manges, MD Device: PPM SJM Location: L CHEST   Wound check visit: YES 90 day MD follow-up: YES  Remote Transmission received:not yet, daughter will send today   Dressing/sling removed: yes  Confirm OAC restart on: yes   Please continue to monitor your cardiac device site for redness, swelling, and drainage. Call the device clinic at [phone] if you experience these symptoms, fever/chills, or have questions about your device.   Remote monitoring is used to monitor your cardiac device from home. This monitoring is scheduled every 91 days by our office. It allows [place] to keep an eye on the functioning of your device to ensure it is working properly.

## 2023-01-04 ENCOUNTER — Ambulatory Visit: Payer: Medicare Other | Attending: Internal Medicine

## 2023-01-04 DIAGNOSIS — Z7901 Long term (current) use of anticoagulants: Secondary | ICD-10-CM

## 2023-01-04 NOTE — Patient Instructions (Signed)
   After Your Pacemaker   Monitor your pacemaker site for redness, swelling, and drainage. Call the device clinic at [phone] if you experience these symptoms or fever/chills.  Your incision was closed with Dermabond:  You may shower 1 day after your defibrillator implant and wash your incision with soap and water. Avoid lotions, ointments, or perfumes over your incision until it is well-healed.  You may use a hot tub or a pool after your wound check appointment if the incision is completely closed.  There are no other restrictions in arm movement after your wound check appointment.  Remote monitoring is used to monitor your pacemaker from home. This monitoring is scheduled every 91 days by our office. It allows [place] to keep an eye on the functioning of your device to ensure it is working properly. You will routinely see your Electrophysiologist annually (more often if necessary).

## 2023-01-05 LAB — CUP PACEART INCLINIC DEVICE CHECK
Date Time Interrogation Session: 20241226103834
Implantable Lead Connection Status: 753985
Implantable Lead Connection Status: 753985
Implantable Lead Implant Date: 20061229
Implantable Lead Implant Date: 20061229
Implantable Lead Location: 753859
Implantable Lead Location: 753860
Implantable Lead Model: 5076
Implantable Pulse Generator Implant Date: 20241209
Pulse Gen Model: 2272
Pulse Gen Serial Number: 8222718

## 2023-01-05 NOTE — Progress Notes (Signed)
Wound check appointment. Steri-strips removed. Wound CDI, approximated, well healed w/ no s/s bleeding/infection. Normal device function. Device programmed at chronic settings. No mode switches or high ventricular rates noted. Patient educated about wound care. ROV in 3 months with implanting physician.

## 2023-01-08 ENCOUNTER — Telehealth: Payer: Self-pay

## 2023-01-08 MED ORDER — APIXABAN 5 MG PO TABS: 5.0000 mg | ORAL_TABLET | Freq: Two times a day (BID) | ORAL | 1 refills | Status: AC

## 2023-01-08 NOTE — Telephone Encounter (Signed)
Called spoke with pt's daughter [name], she had pt step onto her scales at home and pt weighed 136 lbs (61.8kg) and she states if anything her home scales usually weigh less than the MD office scales by a couple pounds, given her experience in the past, so daughter states her weight is probably 137 or 138.   Given pt's current weight >60kg will send in new rx refill for Eliquis 5mg  BID per Dr Graciela Husbands.  Refill sent to [facility] pharmacy per pt's daughter's request.

## 2023-01-08 NOTE — Telephone Encounter (Signed)
-----   Message from Sherryl Manges sent at 01/05/2023  4:09 PM EST ----- You would be right  Lets just make sure her weight is right Thanks SK ----- Message ----- From: Kandace Parkins, RN Sent: 12/19/2022   3:06 PM EST To: Duke Salvia, MD; Alois Cliche, RN  Good afternoon,  Patient's daughter is inquiring about eliquis doseage. She states she was told to reduce the eliquis dose on yesterday to 2.5mg  from 5mg  and wants to ensure this is correct.   The pt is 89 yrs old, wt-62.1kg (137lbs) on 12/18/22, Crea-0.72 on 12/08/22; the correct dose is eliquis 5mg  twice a day per dosing criteria. Can you please advise on eliquis doseage as she needs a refill.   Thanks in advance,  [place], Charity fundraiser

## 2023-01-11 ENCOUNTER — Ambulatory Visit: Payer: Medicare Other

## 2023-02-09 ENCOUNTER — Ambulatory Visit (INDEPENDENT_AMBULATORY_CARE_PROVIDER_SITE_OTHER): Payer: Medicare Other

## 2023-02-09 DIAGNOSIS — I4892 Unspecified atrial flutter: Secondary | ICD-10-CM

## 2023-02-13 LAB — CUP PACEART REMOTE DEVICE CHECK
Battery Remaining Longevity: 56 mo
Battery Remaining Percentage: 95.5 %
Battery Voltage: 2.99 V
Brady Statistic RV Percent Paced: 98 %
Date Time Interrogation Session: 20250131020014
Implantable Lead Connection Status: 753985
Implantable Lead Connection Status: 753985
Implantable Lead Implant Date: 20061229
Implantable Lead Implant Date: 20061229
Implantable Lead Location: 753859
Implantable Lead Location: 753860
Implantable Lead Model: 5076
Implantable Pulse Generator Implant Date: 20241209
Lead Channel Impedance Value: 590 Ohm
Lead Channel Pacing Threshold Amplitude: 2 V
Lead Channel Pacing Threshold Pulse Width: 1 ms
Lead Channel Sensing Intrinsic Amplitude: 12 mV
Lead Channel Setting Pacing Amplitude: 4 V
Lead Channel Setting Pacing Pulse Width: 1 ms
Lead Channel Setting Sensing Sensitivity: 5 mV
Pulse Gen Model: 2272
Pulse Gen Serial Number: 8222718

## 2023-02-19 ENCOUNTER — Other Ambulatory Visit: Payer: Self-pay | Admitting: Physician Assistant

## 2023-03-12 ENCOUNTER — Encounter: Payer: Self-pay | Admitting: Internal Medicine

## 2023-03-16 NOTE — Progress Notes (Signed)
 Remote pacemaker transmission.

## 2023-03-16 NOTE — Addendum Note (Signed)
 Addended by: Elease Etienne A on: 03/16/2023 12:38 PM   Modules accepted: Orders

## 2023-03-26 ENCOUNTER — Encounter: Payer: Self-pay | Admitting: Internal Medicine

## 2023-03-26 ENCOUNTER — Ambulatory Visit: Payer: Medicare Other | Attending: Internal Medicine | Admitting: Internal Medicine

## 2023-03-26 VITALS — BP 118/70 | HR 70 | Resp 16 | Ht 60.0 in | Wt 137.0 lb

## 2023-03-26 DIAGNOSIS — I4892 Unspecified atrial flutter: Secondary | ICD-10-CM | POA: Diagnosis not present

## 2023-03-26 DIAGNOSIS — Z950 Presence of cardiac pacemaker: Secondary | ICD-10-CM | POA: Diagnosis not present

## 2023-03-26 DIAGNOSIS — I4719 Other supraventricular tachycardia: Secondary | ICD-10-CM | POA: Diagnosis not present

## 2023-03-26 DIAGNOSIS — I5032 Chronic diastolic (congestive) heart failure: Secondary | ICD-10-CM

## 2023-03-26 NOTE — Progress Notes (Signed)
 Patient Care Team: Tracey Harries, MD as PCP - General (Family Medicine) Duke Salvia, MD as PCP - Electrophysiology (Cardiology) Duke Salvia, MD (Cardiology)   HPI  Brittany [name] is a 89 y.o. female is seen in followup for complete heart block; she is status post pacemaker implantation and  underwent generator replacement 2017, 2024    She has a history of prior LAD POBA with normal left ventricular function.   She has a history of atrial flutter flutter that appeared  typical. Entrainment mapping, however, demonstrated a left atrial flutter and she was there  cardioverted. She is on anticoagulation w apixoban no bleeding   No complaints.  Mostly wheelchair-bound.  "If wishes were horses Beggars would ride"  Date Cr K Hgb LDL  11/19 0.71 3.9 11.4 83 (5/18)   12/20 (CE)  0.69 4.2 12.5    3/22 (CE)  0.72 3.8 11.4   1/24 0.82 4.2 11.7     DATE TEST EF   12/16 Echo   55-60 % LAE severe  3/24 Echo   60-65 % LAE severe with her and there was no              Echocardiogram 12/16 demonstrated normal LV function and moderate-severe LAE (45/22.2/45)  Past Medical History:  Diagnosis Date   Atrial flutter (HCC)    CAD (coronary artery disease)    CHF (congestive heart failure) (HCC)    Complete heart block (HCC)    a. s/p STJ dual chamber PPM   Depression    Glaucoma    Hyperlipidemia    Hypertension    Pacemaker    Type II diabetes mellitus (HCC)     Past Surgical History:  Procedure Laterality Date   BREAST BIOPSY  1991   ELECTROPHYSIOLOGIC STUDY N/A 02/12/2015   cardioversion by Dr Graciela Husbands    EP IMPLANTABLE DEVICE N/A 04/07/2015   a. STJ dual chamber PPM implanted 2006 for complete heart block b. gen change 2017   FEMUR IM NAIL  01/18/2012   Procedure: INTRAMEDULLARY (IM) NAIL FEMORAL;  Surgeon: Senaida Lange, MD;  Location: [facility] OR;  Service: Orthopedics;  Laterality: Left;   INGUINAL HERNIA REPAIR Right 03/22/2022   Procedure: HERNIA REPAIR INGUINAL  ADULT;  Surgeon: Lucretia Roers, MD;  Location: [facility] ORS;  Service: General;  Laterality: Right;   INTRAMEDULLARY (IM) NAIL INTERTROCHANTERIC Right 05/16/2022   Procedure: INTRAMEDULLARY (IM) NAIL INTERTROCHANTERIC;  Surgeon: Vickki Hearing, MD;  Location: [facility] ORS;  Service: Orthopedics;  Laterality: Right;  on eliquis needs general   left total knee replacement Bilateral 1996   PPM GENERATOR CHANGEOUT N/A 12/18/2022   Procedure: PPM GENERATOR CHANGEOUT;  Surgeon: Duke Salvia, MD;  Location: [facility] INVASIVE CV LAB;  Service: Cardiovascular;  Laterality: N/A;   TONSILLECTOMY  1937    Current Outpatient Medications  Medication Sig Dispense Refill   acetaminophen (TYLENOL) 500 MG tablet Take 1 tablet (500 mg total) by mouth every 6 (six) hours as needed for mild pain, headache, fever or moderate pain.     apixaban (ELIQUIS) 5 MG TABS tablet Take 1 tablet (5 mg total) by mouth 2 (two) times daily. 180 tablet 1   Cholecalciferol (VITAMIN D3) 50 MCG (2000 UT) TABS Take 2,000 Units by mouth daily.     cyanocobalamin 1000 MCG tablet Take 1,000 mcg by mouth daily.     docusate sodium (COLACE) 100 MG capsule Take 100 mg by mouth 2 (two) times daily.  famotidine (PEPCID) 20 MG tablet TAKE ONE TABLET BY MOUTH DAILY 90 tablet 1   ferrous sulfate 325 (65 FE) MG tablet Take 325 mg by mouth daily with breakfast.     furosemide (LASIX) 40 MG tablet TAKE ONE TABLET BY MOUTH DAILY 90 tablet 3   isosorbide mononitrate (IMDUR) 30 MG 24 hr tablet Take 30 mg by mouth daily.     Multiple Vitamins-Minerals (MULTIVITAMIN WOMEN 50+) TABS Take 1 tablet by mouth daily.     Polyethyl Glyc-Propyl Glyc PF (SYSTANE HYDRATION PF) 0.4-0.3 % SOLN Place 1 drop into both eyes daily as needed (dry eyes).     potassium chloride SA (KLOR-CON M) 20 MEQ tablet Take 20 mEq by mouth daily.     No current facility-administered medications for this visit.    Allergies  Allergen Reactions   Ciprofloxacin Other (See Comments)     Severe chest pains   Ace Inhibitors Other (See Comments)    REACTION: cough and sore throat   Cedax [Ceftibuten] Other (See Comments)    Possibly nausea/vomiting, cyanosis, severe headaches   Lipitor [Atorvastatin] Other (See Comments)    REACTION: Myalgia   Plavix [Clopidogrel Bisulfate] Other (See Comments)    Possibly nausea/vomiting, cyanosis, severe headaches     Statins Other (See Comments)    Myalgias, cyanosis    Review of Systems negative except from HPI and PMH  Physical Exam BP 118/70 (BP Location: Left Arm, Patient Position: Sitting, Cuff Size: Normal)   Pulse 70   Resp 16   Ht 5' (1.524 m)   Wt 137 lb (62.1 kg) Comment: Patient reported  BMI 26.76 kg/m  Well developed and well nourished in no acute distress HENT normal Neck supple  Clear Device pocket well healed; without hematoma or erythema.  There is no tethering  Regular rate and rhythm, no  gallop No murmur Abd-soft with active BS No Clubbing cyanosis edema Skin-warm and dry A   Grossly normal sensory and motor function  ECG sinus rhyythm complete heart block V pacing     Device function is normal. Programming changes none  See Paceart for details      Assessment and  Plan  Hypertension    HFpEF  Complete heart block  Pacemaker-[employer] Jude    Coronary disease with prior LAD POBA    Atrial flutter/tachycardia    Device function is normal  No other changes   Her daughter has a retrobulbar mass

## 2023-03-26 NOTE — Patient Instructions (Signed)
 Medication Instructions:  Your physician recommends that you continue on your current medications as directed. Please refer to the Current Medication list given to you today.  *If you need a refill on your cardiac medications before your next appointment, please call your pharmacy*   Lab Work: None ordered.  If you have labs (blood work) drawn today and your tests are completely normal, you will receive your results only by: MyChart Message (if you have MyChart) OR A paper copy in the mail If you have any lab test that is abnormal or we need to change your treatment, we will call you to review the results.   Testing/Procedures: None ordered.    Follow-Up: At [facility], you and your health needs are our priority.  As part of our continuing mission to provide you with exceptional heart care, we have created designated Provider Care Teams.  These Care Teams include your primary Cardiologist (physician) and Advanced Practice Providers (APPs -  Physician Assistants and Nurse Practitioners) who all work together to provide you with the care you need, when you need it.  We recommend signing up for the patient portal called "MyChart".  Sign up information is provided on this After Visit Summary.  MyChart is used to connect with patients for Virtual Visits (Telemedicine).  Patients are able to view lab/test results, encounter notes, upcoming appointments, etc.  Non-urgent messages can be sent to your provider as well.   To learn more about what you can do with MyChart, go to [email or web address].    Your next appointment:   9 months

## 2023-03-28 LAB — CUP PACEART INCLINIC DEVICE CHECK
Battery Remaining Longevity: 55 mo
Battery Voltage: 2.98 V
Brady Statistic RA Percent Paced: 0 %
Brady Statistic RV Percent Paced: 98 %
Date Time Interrogation Session: 20250317144600
Implantable Lead Connection Status: 753985
Implantable Lead Connection Status: 753985
Implantable Lead Implant Date: 20061229
Implantable Lead Implant Date: 20061229
Implantable Lead Location: 753859
Implantable Lead Location: 753860
Implantable Lead Model: 5076
Implantable Pulse Generator Implant Date: 20241209
Lead Channel Impedance Value: 650 Ohm
Lead Channel Pacing Threshold Amplitude: 1.75 V
Lead Channel Pacing Threshold Amplitude: 1.75 V
Lead Channel Pacing Threshold Pulse Width: 1 ms
Lead Channel Pacing Threshold Pulse Width: 1 ms
Lead Channel Sensing Intrinsic Amplitude: 12 mV
Lead Channel Setting Pacing Amplitude: 4 V
Lead Channel Setting Pacing Pulse Width: 1 ms
Lead Channel Setting Sensing Sensitivity: 5 mV
Pulse Gen Model: 2272
Pulse Gen Serial Number: 8222718

## 2023-05-11 ENCOUNTER — Ambulatory Visit (INDEPENDENT_AMBULATORY_CARE_PROVIDER_SITE_OTHER): Payer: Medicare Other

## 2023-05-11 DIAGNOSIS — I4892 Unspecified atrial flutter: Secondary | ICD-10-CM | POA: Diagnosis not present

## 2023-05-14 LAB — CUP PACEART REMOTE DEVICE CHECK
Battery Remaining Longevity: 57 mo
Battery Remaining Percentage: 95.5 %
Battery Voltage: 2.98 V
Brady Statistic RV Percent Paced: 99 %
Date Time Interrogation Session: 20250504020013
Implantable Lead Connection Status: 753985
Implantable Lead Connection Status: 753985
Implantable Lead Implant Date: 20061229
Implantable Lead Implant Date: 20061229
Implantable Lead Location: 753859
Implantable Lead Location: 753860
Implantable Lead Model: 5076
Implantable Pulse Generator Implant Date: 20241209
Lead Channel Impedance Value: 680 Ohm
Lead Channel Pacing Threshold Amplitude: 1.75 V
Lead Channel Pacing Threshold Pulse Width: 1 ms
Lead Channel Sensing Intrinsic Amplitude: 12 mV
Lead Channel Setting Pacing Amplitude: 4 V
Lead Channel Setting Pacing Pulse Width: 1 ms
Lead Channel Setting Sensing Sensitivity: 5 mV
Pulse Gen Model: 2272
Pulse Gen Serial Number: 8222718

## 2023-06-11 ENCOUNTER — Other Ambulatory Visit: Payer: Self-pay | Admitting: Gastroenterology

## 2023-06-21 NOTE — Progress Notes (Signed)
 Remote pacemaker transmission.

## 2023-06-21 NOTE — Addendum Note (Signed)
 Addended by: Lott Rouleau A on: 06/21/2023 08:41 AM   Modules accepted: Orders

## 2023-08-10 ENCOUNTER — Ambulatory Visit (INDEPENDENT_AMBULATORY_CARE_PROVIDER_SITE_OTHER): Payer: Medicare Other

## 2023-08-10 DIAGNOSIS — I4892 Unspecified atrial flutter: Secondary | ICD-10-CM

## 2023-08-10 LAB — CUP PACEART REMOTE DEVICE CHECK
Battery Remaining Longevity: 55 mo
Battery Remaining Percentage: 91 %
Battery Voltage: 2.98 V
Brady Statistic RV Percent Paced: 99 %
Date Time Interrogation Session: 20250801020013
Implantable Lead Connection Status: 753985
Implantable Lead Connection Status: 753985
Implantable Lead Implant Date: 20061229
Implantable Lead Implant Date: 20061229
Implantable Lead Location: 753859
Implantable Lead Location: 753860
Implantable Lead Model: 5076
Implantable Pulse Generator Implant Date: 20241209
Lead Channel Impedance Value: 690 Ohm
Lead Channel Pacing Threshold Amplitude: 1.75 V
Lead Channel Pacing Threshold Pulse Width: 1 ms
Lead Channel Sensing Intrinsic Amplitude: 12 mV
Lead Channel Setting Pacing Amplitude: 4 V
Lead Channel Setting Pacing Pulse Width: 1 ms
Lead Channel Setting Sensing Sensitivity: 5 mV
Pulse Gen Model: 2272
Pulse Gen Serial Number: 8222718

## 2023-08-11 ENCOUNTER — Ambulatory Visit: Payer: Self-pay | Admitting: Cardiology

## 2023-10-08 NOTE — Progress Notes (Signed)
 Remote PPM Transmission

## 2023-11-09 ENCOUNTER — Ambulatory Visit: Payer: Medicare Other

## 2023-11-09 DIAGNOSIS — I4892 Unspecified atrial flutter: Secondary | ICD-10-CM

## 2023-11-10 LAB — CUP PACEART REMOTE DEVICE CHECK
Battery Remaining Longevity: 53 mo
Battery Remaining Percentage: 87 %
Battery Voltage: 2.98 V
Brady Statistic RV Percent Paced: 99 %
Date Time Interrogation Session: 20251031020012
Implantable Lead Connection Status: 753985
Implantable Lead Connection Status: 753985
Implantable Lead Implant Date: 20061229
Implantable Lead Implant Date: 20061229
Implantable Lead Location: 753859
Implantable Lead Location: 753860
Implantable Lead Model: 5076
Implantable Pulse Generator Implant Date: 20241209
Lead Channel Impedance Value: 700 Ohm
Lead Channel Pacing Threshold Amplitude: 1.75 V
Lead Channel Pacing Threshold Pulse Width: 1 ms
Lead Channel Sensing Intrinsic Amplitude: 6.7 mV
Lead Channel Setting Pacing Amplitude: 4 V
Lead Channel Setting Pacing Pulse Width: 1 ms
Lead Channel Setting Sensing Sensitivity: 5 mV
Pulse Gen Model: 2272
Pulse Gen Serial Number: 8222718

## 2023-11-11 ENCOUNTER — Ambulatory Visit: Payer: Self-pay | Admitting: Cardiology

## 2023-11-14 NOTE — Progress Notes (Signed)
 Remote PPM Transmission

## 2023-12-26 ENCOUNTER — Encounter: Payer: Self-pay | Admitting: Cardiology

## 2023-12-26 ENCOUNTER — Ambulatory Visit: Attending: Cardiology | Admitting: Cardiology

## 2023-12-26 VITALS — BP 138/60 | HR 70 | Ht 60.0 in

## 2023-12-26 DIAGNOSIS — I4719 Other supraventricular tachycardia: Secondary | ICD-10-CM

## 2023-12-26 DIAGNOSIS — I442 Atrioventricular block, complete: Secondary | ICD-10-CM | POA: Diagnosis not present

## 2023-12-26 DIAGNOSIS — I5032 Chronic diastolic (congestive) heart failure: Secondary | ICD-10-CM

## 2023-12-26 DIAGNOSIS — I4892 Unspecified atrial flutter: Secondary | ICD-10-CM | POA: Diagnosis not present

## 2023-12-26 NOTE — Progress Notes (Signed)
°  Electrophysiology Office Note:   Date:  12/26/2023  ID:  Brittany [name], DOB 1927/04/08, MRN [id number]  Primary Cardiologist: None Primary Heart Failure: None Electrophysiologist: Wisdom Rickey Gladis Norton, MD      History of Present Illness:   Brittany [name] is a 89 y.o. female with h/o complete heart block, coronary artery disease post POBA, atypical atrial flutter, hypertension, hyperlipidemia, diabetes post cardioversion seen today for routine electrophysiology followup.   Discussed the use of AI scribe software for clinical note transcription with the patient, who gave verbal consent to proceed.  History of Present Illness Brittany [name] is a 89 year old female with a cardiac device who presents for a routine follow-up with her cardiac electrophysiologist.  She has a cardiac device with an estimated battery life of four to four and a half years remaining.  Her dementia has been stable with no significant worsening of symptoms. Her caregiver notes that her physical health has been good, and her condition has remained steady.   she denies chest pain, palpitations, dyspnea, PND, orthopnea, nausea, vomiting, dizziness, syncope, edema, weight gain, or early satiety.   Review of systems complete and found to be negative unless listed in HPI.      EP Information / Studies Reviewed:    EKG is ordered today. Personal review as below.  EKG Interpretation Date/Time:  Wednesday December 26 2023 15:21:03 EST Ventricular Rate:  70 PR Interval:    QRS Duration:  156 QT Interval:  468 QTC Calculation: 505 R Axis:   257  Text Interpretation: Atrial flutter with complete heart block and Ventricular-paced rhythm When compared with ECG of 15-May-2022 06:28, No significant change since last tracing Confirmed by Jivan Symanski ([id number]) on 12/26/2023 3:30:34 PM   PPM Interrogation-  reviewed in detail today,  See PACEART report.  Device History: Abbott Dual Chamber PPM implanted generator  change 12/18/2022 for CHB  Risk Assessment/Calculations:    CHA2DS2-VASc Score = 6   This indicates a 9.7% annual risk of stroke. The patient's score is based upon: CHF History: 0 HTN History: 1 Diabetes History: 1 Stroke History: 0 Vascular Disease History: 1 Age Score: 2 Gender Score: 1            Physical Exam:   VS:  BP 138/60 (BP Location: Left Arm, Patient Position: Sitting, Cuff Size: Normal)   Pulse 70   Ht 5' (1.524 m)   SpO2 95%   BMI 26.76 kg/m    Wt Readings from Last 3 Encounters:  03/26/23 137 lb (62.1 kg)  12/18/22 137 lb (62.1 kg)  11/17/22 143 lb 6.4 oz (65 kg)     GEN: Well nourished, well developed in no acute distress NECK: No JVD; No carotid bruits CARDIAC: Regular rate and rhythm, no murmurs, rubs, gallops RESPIRATORY:  Clear to auscultation without rales, wheezing or rhonchi  ABDOMEN: Soft, non-tender, non-distended EXTREMITIES:  No edema; No deformity   ASSESSMENT AND PLAN:    CHB s/p Abbott PPM  Normal PPM function See Pace Art report No changes today  2.  Hypertension: Well-controlled  3.  Atypical atrial flutter: At EP study was found to be left atrial.  Post cardioversion.  In atrial flutter today.  Belky Mundo plan for a rate control strategy.  4.  Secondary hypercoagulable state: On Eliquis   5.  Chronic diastolic heart failure: No volume overload  6.  Coronary artery disease: Post POBA to the LAD.  No current chest pain.  Signed, Wilmary Levit Gladis Norton, MD

## 2024-01-31 ENCOUNTER — Ambulatory Visit: Payer: Self-pay | Admitting: Cardiology

## 2024-01-31 LAB — CUP PACEART INCLINIC DEVICE CHECK
Battery Remaining Longevity: 50 mo
Battery Voltage: 2.98 V
Brady Statistic RA Percent Paced: 0 %
Brady Statistic RV Percent Paced: 99.51 %
Date Time Interrogation Session: 20251217154200
Implantable Lead Connection Status: 753985
Implantable Lead Connection Status: 753985
Implantable Lead Implant Date: 20061229
Implantable Lead Implant Date: 20061229
Implantable Lead Location: 753859
Implantable Lead Location: 753860
Implantable Lead Model: 5076
Implantable Pulse Generator Implant Date: 20241209
Lead Channel Impedance Value: 737.5 Ohm
Lead Channel Pacing Threshold Amplitude: 1.75 V
Lead Channel Pacing Threshold Amplitude: 1.75 V
Lead Channel Pacing Threshold Pulse Width: 1 ms
Lead Channel Pacing Threshold Pulse Width: 1 ms
Lead Channel Sensing Intrinsic Amplitude: 6.7 mV
Lead Channel Setting Pacing Amplitude: 4 V
Lead Channel Setting Pacing Pulse Width: 1 ms
Lead Channel Setting Sensing Sensitivity: 5 mV
Pulse Gen Model: 2272
Pulse Gen Serial Number: 8222718

## 2024-02-08 ENCOUNTER — Ambulatory Visit: Payer: Self-pay | Admitting: Cardiology

## 2024-02-08 ENCOUNTER — Ambulatory Visit: Payer: Medicare Other

## 2024-02-08 DIAGNOSIS — I4892 Unspecified atrial flutter: Secondary | ICD-10-CM

## 2024-02-08 LAB — CUP PACEART REMOTE DEVICE CHECK
Battery Remaining Longevity: 53 mo
Battery Remaining Percentage: 83 %
Battery Voltage: 2.96 V
Brady Statistic RV Percent Paced: 99 %
Date Time Interrogation Session: 20260130050811
Implantable Lead Connection Status: 753985
Implantable Lead Connection Status: 753985
Implantable Lead Implant Date: 20061229
Implantable Lead Implant Date: 20061229
Implantable Lead Location: 753859
Implantable Lead Location: 753860
Implantable Lead Model: 5076
Implantable Pulse Generator Implant Date: 20241209
Lead Channel Impedance Value: 780 Ohm
Lead Channel Pacing Threshold Amplitude: 1.75 V
Lead Channel Pacing Threshold Pulse Width: 1 ms
Lead Channel Sensing Intrinsic Amplitude: 6.7 mV
Lead Channel Setting Pacing Amplitude: 4 V
Lead Channel Setting Pacing Pulse Width: 1 ms
Lead Channel Setting Sensing Sensitivity: 5 mV
Pulse Gen Model: 2272
Pulse Gen Serial Number: 8222718

## 2024-02-11 ENCOUNTER — Other Ambulatory Visit: Payer: Self-pay | Admitting: Physician Assistant

## 2024-02-14 NOTE — Progress Notes (Signed)
 Remote PPM Transmission

## 2024-02-14 NOTE — Telephone Encounter (Signed)
 Labs on 01/25/24 Magnesium Overdue  In accordance with refill protocols, please review and address the following requirements before this medication refill can be authorized:  Labs

## 2024-05-09 ENCOUNTER — Ambulatory Visit

## 2024-08-08 ENCOUNTER — Ambulatory Visit

## 2024-11-07 ENCOUNTER — Ambulatory Visit

## 2025-02-06 ENCOUNTER — Ambulatory Visit

## 2025-05-08 ENCOUNTER — Ambulatory Visit
